# Patient Record
Sex: Female | Born: 1965 | Race: White | Hispanic: No | Marital: Married | Smoking: Current every day smoker
Health system: Southern US, Community
[De-identification: ages and names within clinical notes are randomized; demographics above are authoritative.]

## PROBLEM LIST (undated history)

## (undated) DIAGNOSIS — D3501 Benign neoplasm of right adrenal gland: Secondary | ICD-10-CM

## (undated) DIAGNOSIS — G4733 Obstructive sleep apnea (adult) (pediatric): Secondary | ICD-10-CM

## (undated) DIAGNOSIS — I200 Unstable angina: Secondary | ICD-10-CM

## (undated) DIAGNOSIS — K2270 Barrett's esophagus without dysplasia: Secondary | ICD-10-CM

## (undated) DIAGNOSIS — I639 Cerebral infarction, unspecified: Secondary | ICD-10-CM

## (undated) DIAGNOSIS — Z86010 Personal history of colonic polyps: Secondary | ICD-10-CM

## (undated) DIAGNOSIS — I2510 Atherosclerotic heart disease of native coronary artery without angina pectoris: Secondary | ICD-10-CM

## (undated) DIAGNOSIS — K7581 Nonalcoholic steatohepatitis (NASH): Secondary | ICD-10-CM

## (undated) DIAGNOSIS — I219 Acute myocardial infarction, unspecified: Secondary | ICD-10-CM

## (undated) DIAGNOSIS — F419 Anxiety disorder, unspecified: Secondary | ICD-10-CM

## (undated) DIAGNOSIS — F32A Depression, unspecified: Secondary | ICD-10-CM

## (undated) DIAGNOSIS — D649 Anemia, unspecified: Secondary | ICD-10-CM

## (undated) DIAGNOSIS — D1803 Hemangioma of intra-abdominal structures: Secondary | ICD-10-CM

## (undated) DIAGNOSIS — T7840XA Allergy, unspecified, initial encounter: Secondary | ICD-10-CM

## (undated) DIAGNOSIS — M1990 Unspecified osteoarthritis, unspecified site: Secondary | ICD-10-CM

## (undated) DIAGNOSIS — K219 Gastro-esophageal reflux disease without esophagitis: Secondary | ICD-10-CM

## (undated) DIAGNOSIS — R0989 Other specified symptoms and signs involving the circulatory and respiratory systems: Secondary | ICD-10-CM

## (undated) DIAGNOSIS — I10 Essential (primary) hypertension: Secondary | ICD-10-CM

## (undated) DIAGNOSIS — K7689 Other specified diseases of liver: Secondary | ICD-10-CM

## (undated) DIAGNOSIS — F329 Major depressive disorder, single episode, unspecified: Secondary | ICD-10-CM

## (undated) DIAGNOSIS — E785 Hyperlipidemia, unspecified: Secondary | ICD-10-CM

## (undated) DIAGNOSIS — G43909 Migraine, unspecified, not intractable, without status migrainosus: Secondary | ICD-10-CM

## (undated) DIAGNOSIS — R002 Palpitations: Secondary | ICD-10-CM

## (undated) DIAGNOSIS — R011 Cardiac murmur, unspecified: Secondary | ICD-10-CM

## (undated) HISTORY — DX: Depression, unspecified: F32.A

## (undated) HISTORY — DX: Major depressive disorder, single episode, unspecified: F32.9

## (undated) HISTORY — PX: TUBAL LIGATION: SHX77

## (undated) HISTORY — DX: Obstructive sleep apnea (adult) (pediatric): G47.33

## (undated) HISTORY — DX: Anemia, unspecified: D64.9

## (undated) HISTORY — DX: Allergy, unspecified, initial encounter: T78.40XA

## (undated) HISTORY — DX: Unstable angina: I20.0

## (undated) HISTORY — DX: Migraine, unspecified, not intractable, without status migrainosus: G43.909

## (undated) HISTORY — DX: Atherosclerotic heart disease of native coronary artery without angina pectoris: I25.10

## (undated) HISTORY — PX: CERVICAL FUSION: SHX112

## (undated) HISTORY — DX: Hyperlipidemia, unspecified: E78.5

## (undated) HISTORY — DX: Unspecified osteoarthritis, unspecified site: M19.90

## (undated) HISTORY — PX: TONSILLECTOMY: SUR1361

## (undated) HISTORY — DX: Cerebral infarction, unspecified: I63.9

## (undated) HISTORY — DX: Palpitations: R00.2

## (undated) HISTORY — PX: ABDOMINOPLASTY: SUR9

## (undated) HISTORY — DX: Barrett's esophagus without dysplasia: K22.70

## (undated) HISTORY — DX: Other specified symptoms and signs involving the circulatory and respiratory systems: R09.89

## (undated) HISTORY — PX: BLADDER SUSPENSION: SHX72

## (undated) HISTORY — PX: ESOPHAGOGASTRODUODENOSCOPY: SHX1529

## (undated) HISTORY — DX: Hemangioma of intra-abdominal structures: D18.03

## (undated) HISTORY — DX: Acute myocardial infarction, unspecified: I21.9

## (undated) HISTORY — DX: Anxiety disorder, unspecified: F41.9

## (undated) HISTORY — DX: Other specified diseases of liver: K76.89

## (undated) HISTORY — DX: Benign neoplasm of right adrenal gland: D35.01

## (undated) HISTORY — DX: Essential (primary) hypertension: I10

## (undated) HISTORY — DX: Personal history of colonic polyps: Z86.010

## (undated) HISTORY — PX: COLONOSCOPY W/ BIOPSIES: SHX1374

## (undated) HISTORY — DX: Gastro-esophageal reflux disease without esophagitis: K21.9

## (undated) HISTORY — DX: Cardiac murmur, unspecified: R01.1

---

## 1998-08-12 ENCOUNTER — Ambulatory Visit (HOSPITAL_COMMUNITY): Admission: RE | Admit: 1998-08-12 | Discharge: 1998-08-12 | Payer: Self-pay | Admitting: Orthopedic Surgery

## 1998-11-21 ENCOUNTER — Other Ambulatory Visit: Admission: RE | Admit: 1998-11-21 | Discharge: 1998-11-21 | Payer: Self-pay | Admitting: Obstetrics and Gynecology

## 1998-12-15 ENCOUNTER — Ambulatory Visit (HOSPITAL_COMMUNITY): Admission: RE | Admit: 1998-12-15 | Discharge: 1998-12-15 | Payer: Self-pay | Admitting: *Deleted

## 1998-12-15 ENCOUNTER — Encounter (INDEPENDENT_AMBULATORY_CARE_PROVIDER_SITE_OTHER): Payer: Self-pay | Admitting: Specialist

## 1999-12-12 ENCOUNTER — Other Ambulatory Visit: Admission: RE | Admit: 1999-12-12 | Discharge: 1999-12-12 | Payer: Self-pay | Admitting: Obstetrics and Gynecology

## 2001-02-18 ENCOUNTER — Encounter: Admission: RE | Admit: 2001-02-18 | Discharge: 2001-02-18 | Payer: Self-pay | Admitting: Family Medicine

## 2001-02-18 ENCOUNTER — Encounter: Payer: Self-pay | Admitting: Family Medicine

## 2001-03-31 ENCOUNTER — Encounter: Payer: Self-pay | Admitting: Neurosurgery

## 2001-04-02 ENCOUNTER — Ambulatory Visit (HOSPITAL_COMMUNITY): Admission: RE | Admit: 2001-04-02 | Discharge: 2001-04-03 | Payer: Self-pay | Admitting: Neurosurgery

## 2001-04-02 ENCOUNTER — Encounter: Payer: Self-pay | Admitting: Neurosurgery

## 2001-05-22 ENCOUNTER — Ambulatory Visit (HOSPITAL_COMMUNITY): Admission: RE | Admit: 2001-05-22 | Discharge: 2001-05-22 | Payer: Self-pay | Admitting: Neurosurgery

## 2001-05-22 ENCOUNTER — Encounter: Payer: Self-pay | Admitting: Neurosurgery

## 2002-04-08 ENCOUNTER — Encounter: Payer: Self-pay | Admitting: Occupational Medicine

## 2002-04-08 ENCOUNTER — Encounter: Admission: RE | Admit: 2002-04-08 | Discharge: 2002-04-08 | Payer: Self-pay | Admitting: Occupational Medicine

## 2002-05-11 ENCOUNTER — Encounter: Admission: RE | Admit: 2002-05-11 | Discharge: 2002-05-27 | Payer: Self-pay | Admitting: Occupational Medicine

## 2003-05-07 ENCOUNTER — Emergency Department (HOSPITAL_COMMUNITY): Admission: EM | Admit: 2003-05-07 | Discharge: 2003-05-07 | Payer: Self-pay | Admitting: Emergency Medicine

## 2003-08-09 ENCOUNTER — Ambulatory Visit (HOSPITAL_COMMUNITY): Admission: RE | Admit: 2003-08-09 | Discharge: 2003-08-09 | Payer: Self-pay | Admitting: Family Medicine

## 2003-09-02 ENCOUNTER — Encounter: Admission: RE | Admit: 2003-09-02 | Discharge: 2003-09-02 | Payer: Self-pay | Admitting: Family Medicine

## 2003-11-19 ENCOUNTER — Inpatient Hospital Stay (HOSPITAL_COMMUNITY): Admission: EM | Admit: 2003-11-19 | Discharge: 2003-11-23 | Payer: Self-pay | Admitting: *Deleted

## 2003-11-30 ENCOUNTER — Other Ambulatory Visit: Admission: RE | Admit: 2003-11-30 | Discharge: 2003-11-30 | Payer: Self-pay | Admitting: Obstetrics and Gynecology

## 2004-01-17 ENCOUNTER — Encounter (INDEPENDENT_AMBULATORY_CARE_PROVIDER_SITE_OTHER): Payer: Self-pay | Admitting: Specialist

## 2004-01-17 ENCOUNTER — Ambulatory Visit (HOSPITAL_COMMUNITY): Admission: RE | Admit: 2004-01-17 | Discharge: 2004-01-17 | Payer: Self-pay | Admitting: Obstetrics and Gynecology

## 2004-03-09 ENCOUNTER — Ambulatory Visit: Payer: Self-pay | Admitting: Cardiology

## 2004-03-15 ENCOUNTER — Ambulatory Visit: Payer: Self-pay

## 2004-04-03 ENCOUNTER — Ambulatory Visit: Payer: Self-pay | Admitting: Internal Medicine

## 2004-04-04 ENCOUNTER — Ambulatory Visit: Payer: Self-pay | Admitting: Internal Medicine

## 2004-04-10 ENCOUNTER — Other Ambulatory Visit: Admission: RE | Admit: 2004-04-10 | Discharge: 2004-04-10 | Payer: Self-pay | Admitting: Obstetrics and Gynecology

## 2004-04-17 ENCOUNTER — Ambulatory Visit: Payer: Self-pay | Admitting: Internal Medicine

## 2004-05-17 ENCOUNTER — Ambulatory Visit: Payer: Self-pay | Admitting: Internal Medicine

## 2004-05-22 ENCOUNTER — Ambulatory Visit: Payer: Self-pay | Admitting: Cardiology

## 2004-07-03 ENCOUNTER — Ambulatory Visit: Payer: Self-pay | Admitting: Cardiology

## 2004-11-29 ENCOUNTER — Ambulatory Visit: Payer: Self-pay | Admitting: Cardiology

## 2004-12-06 ENCOUNTER — Ambulatory Visit: Payer: Self-pay | Admitting: Cardiology

## 2005-05-27 ENCOUNTER — Emergency Department (HOSPITAL_COMMUNITY): Admission: EM | Admit: 2005-05-27 | Discharge: 2005-05-27 | Payer: Self-pay | Admitting: Family Medicine

## 2005-06-06 ENCOUNTER — Ambulatory Visit: Payer: Self-pay | Admitting: Cardiology

## 2005-08-03 ENCOUNTER — Emergency Department (HOSPITAL_COMMUNITY): Admission: EM | Admit: 2005-08-03 | Discharge: 2005-08-03 | Payer: Self-pay | Admitting: Emergency Medicine

## 2005-08-29 ENCOUNTER — Encounter: Payer: Self-pay | Admitting: Orthopedic Surgery

## 2006-02-20 ENCOUNTER — Ambulatory Visit: Payer: Self-pay | Admitting: Cardiology

## 2006-02-27 ENCOUNTER — Ambulatory Visit: Payer: Self-pay

## 2006-04-30 ENCOUNTER — Encounter (INDEPENDENT_AMBULATORY_CARE_PROVIDER_SITE_OTHER): Payer: Self-pay | Admitting: *Deleted

## 2006-05-03 ENCOUNTER — Ambulatory Visit: Payer: Self-pay | Admitting: Cardiology

## 2006-05-08 ENCOUNTER — Ambulatory Visit: Payer: Self-pay | Admitting: Cardiology

## 2006-05-28 ENCOUNTER — Ambulatory Visit: Payer: Self-pay | Admitting: Internal Medicine

## 2006-05-28 LAB — CONVERTED CEMR LAB
Basophils Absolute: 0.1 10*3/uL (ref 0.0–0.1)
Basophils Relative: 0.5 % (ref 0.0–1.0)
Eosinophils Absolute: 0.1 10*3/uL (ref 0.0–0.6)
INR: 0.9 (ref 0.9–2.0)
MCHC: 35 g/dL (ref 30.0–36.0)
MCV: 83.6 fL (ref 78.0–100.0)
Neutro Abs: 5.3 10*3/uL (ref 1.4–7.7)
Platelets: 279 10*3/uL (ref 150–400)
Prothrombin Time: 11.5 s (ref 10.0–14.0)
RBC: 5 M/uL (ref 3.87–5.11)
RDW: 12.7 % (ref 11.5–14.6)

## 2006-05-31 ENCOUNTER — Ambulatory Visit: Payer: Self-pay | Admitting: Cardiology

## 2006-05-31 LAB — CONVERTED CEMR LAB
AST: 35 units/L (ref 0–37)
Albumin: 3.9 g/dL (ref 3.5–5.2)
CO2: 30 meq/L (ref 19–32)
Chloride: 105 meq/L (ref 96–112)
Creatinine, Ser: 0.7 mg/dL (ref 0.4–1.2)
GFR calc non Af Amer: 98 mL/min
Glucose, Bld: 88 mg/dL (ref 70–99)
Sodium: 140 meq/L (ref 135–145)
Total Bilirubin: 0.5 mg/dL (ref 0.3–1.2)
Total Protein: 6.8 g/dL (ref 6.0–8.3)
Triglycerides: 106 mg/dL (ref 0–149)

## 2006-06-06 ENCOUNTER — Encounter (INDEPENDENT_AMBULATORY_CARE_PROVIDER_SITE_OTHER): Payer: Self-pay | Admitting: *Deleted

## 2006-06-06 ENCOUNTER — Ambulatory Visit (HOSPITAL_COMMUNITY): Admission: RE | Admit: 2006-06-06 | Discharge: 2006-06-06 | Payer: Self-pay | Admitting: Internal Medicine

## 2008-07-26 ENCOUNTER — Encounter: Payer: Self-pay | Admitting: Cardiology

## 2008-07-26 ENCOUNTER — Ambulatory Visit: Payer: Self-pay | Admitting: Cardiology

## 2008-07-26 DIAGNOSIS — I10 Essential (primary) hypertension: Secondary | ICD-10-CM | POA: Insufficient documentation

## 2008-07-26 DIAGNOSIS — I2510 Atherosclerotic heart disease of native coronary artery without angina pectoris: Secondary | ICD-10-CM

## 2008-07-26 DIAGNOSIS — F17200 Nicotine dependence, unspecified, uncomplicated: Secondary | ICD-10-CM | POA: Insufficient documentation

## 2008-07-26 DIAGNOSIS — G44009 Cluster headache syndrome, unspecified, not intractable: Secondary | ICD-10-CM | POA: Insufficient documentation

## 2008-07-26 DIAGNOSIS — E7849 Other hyperlipidemia: Secondary | ICD-10-CM | POA: Insufficient documentation

## 2008-07-27 ENCOUNTER — Telehealth (INDEPENDENT_AMBULATORY_CARE_PROVIDER_SITE_OTHER): Payer: Self-pay

## 2008-07-28 ENCOUNTER — Ambulatory Visit: Payer: Self-pay

## 2008-07-28 ENCOUNTER — Encounter: Payer: Self-pay | Admitting: Cardiology

## 2008-07-28 ENCOUNTER — Ambulatory Visit: Payer: Self-pay | Admitting: Cardiology

## 2008-07-28 LAB — CONVERTED CEMR LAB
Bilirubin, Direct: 0.1 mg/dL (ref 0.0–0.3)
Cholesterol: 250 mg/dL — ABNORMAL HIGH (ref 0–200)
Direct LDL: 192.2 mg/dL
Total Bilirubin: 0.6 mg/dL (ref 0.3–1.2)
Total CHOL/HDL Ratio: 6
Triglycerides: 131 mg/dL (ref 0.0–149.0)
VLDL: 26.2 mg/dL (ref 0.0–40.0)

## 2008-09-10 ENCOUNTER — Encounter: Admission: RE | Admit: 2008-09-10 | Discharge: 2008-09-10 | Payer: Self-pay | Admitting: Obstetrics and Gynecology

## 2009-05-30 ENCOUNTER — Ambulatory Visit: Payer: Self-pay | Admitting: Cardiology

## 2009-05-30 DIAGNOSIS — R5381 Other malaise: Secondary | ICD-10-CM

## 2009-05-30 DIAGNOSIS — R5383 Other fatigue: Secondary | ICD-10-CM

## 2009-05-30 DIAGNOSIS — R079 Chest pain, unspecified: Secondary | ICD-10-CM | POA: Insufficient documentation

## 2009-06-01 LAB — CONVERTED CEMR LAB
ALT: 29 units/L (ref 0–35)
AST: 22 units/L (ref 0–37)
Albumin: 4.3 g/dL (ref 3.5–5.2)
BUN: 11 mg/dL (ref 6–23)
Basophils Relative: 0.3 % (ref 0.0–3.0)
Bilirubin, Direct: 0 mg/dL (ref 0.0–0.3)
CO2: 30 meq/L (ref 19–32)
Chloride: 102 meq/L (ref 96–112)
Eosinophils Absolute: 0.1 10*3/uL (ref 0.0–0.7)
Eosinophils Relative: 0.9 % (ref 0.0–5.0)
Lymphocytes Relative: 41 % (ref 12.0–46.0)
Lymphs Abs: 4.3 10*3/uL — ABNORMAL HIGH (ref 0.7–4.0)
Neutrophils Relative %: 51.3 % (ref 43.0–77.0)
Platelets: 294 10*3/uL (ref 150.0–400.0)
RDW: 13.3 % (ref 11.5–14.6)
Sodium: 139 meq/L (ref 135–145)
Total Bilirubin: 0.2 mg/dL — ABNORMAL LOW (ref 0.3–1.2)
Total Protein: 7.3 g/dL (ref 6.0–8.3)

## 2009-06-07 ENCOUNTER — Telehealth (INDEPENDENT_AMBULATORY_CARE_PROVIDER_SITE_OTHER): Payer: Self-pay | Admitting: *Deleted

## 2009-06-08 ENCOUNTER — Ambulatory Visit (HOSPITAL_COMMUNITY): Admission: RE | Admit: 2009-06-08 | Discharge: 2009-06-08 | Payer: Self-pay | Admitting: Cardiology

## 2009-06-08 ENCOUNTER — Encounter: Payer: Self-pay | Admitting: Cardiology

## 2009-06-08 ENCOUNTER — Ambulatory Visit: Payer: Self-pay | Admitting: Cardiovascular Disease

## 2009-06-08 ENCOUNTER — Ambulatory Visit: Payer: Self-pay

## 2009-06-13 ENCOUNTER — Telehealth: Payer: Self-pay | Admitting: Cardiology

## 2009-06-22 ENCOUNTER — Encounter: Payer: Self-pay | Admitting: Cardiology

## 2009-06-27 ENCOUNTER — Ambulatory Visit: Payer: Self-pay | Admitting: Cardiology

## 2009-07-11 ENCOUNTER — Ambulatory Visit: Payer: Self-pay | Admitting: Cardiology

## 2009-07-20 LAB — CONVERTED CEMR LAB
Alkaline Phosphatase: 45 units/L (ref 39–117)
Bilirubin, Direct: 0 mg/dL (ref 0.0–0.3)
Cholesterol: 166 mg/dL (ref 0–200)
LDL Cholesterol: 88 mg/dL (ref 0–99)
Total Bilirubin: 0.2 mg/dL — ABNORMAL LOW (ref 0.3–1.2)
Total CHOL/HDL Ratio: 3
Triglycerides: 63 mg/dL (ref 0.0–149.0)
VLDL: 12.6 mg/dL (ref 0.0–40.0)

## 2009-10-18 ENCOUNTER — Encounter (INDEPENDENT_AMBULATORY_CARE_PROVIDER_SITE_OTHER): Payer: Self-pay | Admitting: *Deleted

## 2010-01-04 ENCOUNTER — Encounter: Payer: Self-pay | Admitting: Cardiology

## 2010-01-13 ENCOUNTER — Encounter: Payer: Self-pay | Admitting: Cardiology

## 2010-01-13 ENCOUNTER — Ambulatory Visit: Payer: Self-pay | Admitting: Cardiology

## 2010-01-16 ENCOUNTER — Emergency Department (HOSPITAL_COMMUNITY): Admission: EM | Admit: 2010-01-16 | Discharge: 2010-01-16 | Payer: Self-pay | Admitting: Physician Assistant

## 2010-01-18 ENCOUNTER — Telehealth (INDEPENDENT_AMBULATORY_CARE_PROVIDER_SITE_OTHER): Payer: Self-pay | Admitting: *Deleted

## 2010-01-20 ENCOUNTER — Encounter: Payer: Self-pay | Admitting: Cardiology

## 2010-02-10 ENCOUNTER — Ambulatory Visit: Payer: Self-pay | Admitting: Cardiology

## 2010-03-08 ENCOUNTER — Telehealth: Payer: Self-pay | Admitting: Cardiology

## 2010-04-14 ENCOUNTER — Ambulatory Visit: Payer: Self-pay | Admitting: Cardiology

## 2010-05-19 ENCOUNTER — Other Ambulatory Visit: Payer: Self-pay | Admitting: Cardiology

## 2010-05-19 LAB — LDL CHOLESTEROL, DIRECT: Direct LDL: 216 mg/dL

## 2010-05-19 LAB — HEPATIC FUNCTION PANEL
ALT: 50 U/L — ABNORMAL HIGH (ref 0–35)
AST: 41 U/L — ABNORMAL HIGH (ref 0–37)
Albumin: 4.3 g/dL (ref 3.5–5.2)
Alkaline Phosphatase: 57 U/L (ref 39–117)
Bilirubin, Direct: 0.1 mg/dL (ref 0.0–0.3)
Total Bilirubin: 0.5 mg/dL (ref 0.3–1.2)
Total Protein: 7.1 g/dL (ref 6.0–8.3)

## 2010-05-19 LAB — LIPID PANEL
Cholesterol: 274 mg/dL — ABNORMAL HIGH (ref 0–200)
HDL: 56.7 mg/dL (ref >39.00)
Total CHOL/HDL Ratio: 5
Triglycerides: 127 mg/dL (ref 0.0–149.0)
VLDL: 25.4 mg/dL (ref 0.0–40.0)

## 2010-06-01 NOTE — Letter (Signed)
Summary: [facility] - Walk In Patient Form  [facility] - Walk In Patient Form   Imported By: Marilynne Drivers 02/07/2010 08:11:07  _____________________________________________________________________  External Attachment:    Type:   Image     Comment:   External Document

## 2010-06-01 NOTE — Letter (Signed)
Summary: [facility]   Imported By: Sherri Sear 12/30/2009 12:31:20  _____________________________________________________________________  External Attachment:    Type:   Image     Comment:   External Document

## 2010-06-01 NOTE — Assessment & Plan Note (Signed)
Summary: Cardiology Nuclear Study  Nuclear Med Background Indications for Stress Test: Evaluation for Ischemia   History: Abnormal EKG, Asthma, Heart Catheterization, Myocardial Infarction, Myocardial Perfusion Study  History Comments: '05 MI>Cath:Moderate CAD and possible vasospasm; 3/10  MPI: normal, EF=74%  Symptoms: Chest Pain, Chest Tightness, Dizziness, DOE, Fatigue, Near Syncope, Palpitations, Rapid HR, SOB  Symptoms Comments: Last episode of JT:TSVX week.   Nuclear Pre-Procedure Cardiac Risk Factors: Family History - CAD, Hypertension, Lipids, Smoker Caffeine/Decaff Intake: none NPO After: 8:00 PM Lungs: Clear.  O2 Sat 98% on RA. IV 0.9% NS with Angio Cath: 18g     IV Site: (R) AC IV Started by: Eliezer Lofts EMT-P Chest Size (in) 38     Cup Size C     Height (in): 68 Weight (lb): 161 BMI: 24.57  Nuclear Med Study 1 or 2 day study:  1 day     Stress Test Type:  Carlton Adam Reading MD:  Jenkins Rouge, MD     Referring MD:  Kirk Ruths, MD Resting Radionuclide:  Technetium 45mTetrofosmin     Resting Radionuclide Dose:  10.4 mCi  Stress Radionuclide:  Technetium 93metrofosmin     Stress Radionuclide Dose:  33.0 mCi   Stress Protocol   Lexiscan: 0.4 mg   Stress Test Technologist:  ShValetta FullerMA-N     Nuclear Technologist:  WaMariann Lastereal RT-N  Rest Procedure  Myocardial perfusion imaging was performed at rest 45 minutes following the intravenous administration of Myoview Technetium 9967mtrofosmin.  Stress Procedure  The patient received IV Lexiscan 0.4 mg over 15-seconds.  Myoview injected at 30-seconds.  There were no diagnostic changes with lexiscan; there were diffuse T-wave changes.  Quantitative spect images were obtained after a 45 minute delay.  QPS Raw Data Images:  Normal; no motion artifact; normal heart/lung ratio. Stress Images:  NI: Uniform and normal uptake of tracer in all myocardial segments. Rest Images:  Normal homogeneous uptake in all areas of  the myocardium. Subtraction (SDS):  Normal Transient Ischemic Dilatation:  1.09  (Normal <1.22)  Lung/Heart Ratio:  .29  (Normal <0.45)  Quantitative Gated Spect Images QGS EDV:  93 ml QGS ESV:  31 ml QGS EF:  67 % QGS cine images:  normal  Findings Normal nuclear study      Overall Impression  Exercise Capacity: lexiscan BP Response: Normal blood pressure response. Clinical Symptoms: Dizzy ECG Impression: No significant ST segment change suggestive of ischemia. Overall Impression: Normal stress nuclear study. Overall Impression Comments: Normal  Appended Document: Cardiology Nuclear Study normal  Appended Document: Cardiology Nuclear Study PT AWARE./CY

## 2010-06-01 NOTE — Miscellaneous (Signed)
  Clinical Lists Changes  Observations: Added new observation of NUCLEAR NOS: QPS  Raw Data Images:  Normal; no motion artifact; normal heart/lung ratio. Stress Images:  NI: Uniform and normal uptake of tracer in all myocardial segments. Rest Images:  Normal homogeneous uptake in all areas of the myocardium. Subtraction (SDS):  Normal Transient Ischemic Dilatation:  1.09  (Normal <1.22)  Lung/Heart Ratio:  .29  (Normal <0.45)  Quantitative Gated Spect Images  QGS EDV:  93 ml QGS ESV:  31 ml QGS EF:  67 % QGS cine images:  normal  Findings  Normal nuclear study      Overall Impression   Exercise Capacity: lexiscan BP Response: Normal blood pressure response. Clinical Symptoms: Dizzy ECG Impression: No significant ST segment change suggestive of ischemia. Overall Impression: Normal stress nuclear study. Overall Impression Comments: Normal    Signed by Bosie Clos, MD, [facility] on 06/08/2009 at 5:25 PM (06/08/2009 14:34)      Nuclear Study  Procedure date:  06/08/2009  Findings:      QPS  Raw Data Images:  Normal; no motion artifact; normal heart/lung ratio. Stress Images:  NI: Uniform and normal uptake of tracer in all myocardial segments. Rest Images:  Normal homogeneous uptake in all areas of the myocardium. Subtraction (SDS):  Normal Transient Ischemic Dilatation:  1.09  (Normal <1.22)  Lung/Heart Ratio:  .29  (Normal <0.45)  Quantitative Gated Spect Images  QGS EDV:  93 ml QGS ESV:  31 ml QGS EF:  67 % QGS cine images:  normal  Findings  Normal nuclear study      Overall Impression   Exercise Capacity: lexiscan BP Response: Normal blood pressure response. Clinical Symptoms: Dizzy ECG Impression: No significant ST segment change suggestive of ischemia. Overall Impression: Normal stress nuclear study. Overall Impression Comments: Normal    Signed by Bosie Clos, MD, [facility] on 06/08/2009 at 5:25 PM

## 2010-06-01 NOTE — Letter (Signed)
Summary: Appointment - Reminder [place], [place]  [address] [place]   [place], [place] [address]   Phone: [phone]  Fax: [phone]     October 18, 2009 MRN: [id number]   [facility] [place], [place]  [address]   Dear Ms. Montesinos,  Our records indicate that it is time to schedule a follow-up appointment with Dr. Aundra Dubin in August. It is very important that we reach you to schedule this appointment. We look forward to participating in your health care needs. Please contact us at the number listed above at your earliest convenience to schedule your appointment.  If you are unable to make an appointment at this time, give [place] a call so we can update our records.     Sincerely,   Darnell Level [facility] Scheduling Team

## 2010-06-01 NOTE — Procedures (Signed)
Summary: Summary Report  Summary Report   Imported By: Gemma Payor 03/03/2010 10:50:23  _____________________________________________________________________  External Attachment:    Type:   Image     Comment:   External Document

## 2010-06-01 NOTE — Progress Notes (Signed)
Summary: At [place] By: Marilynne Drivers 08/16/2009 12:38:17  _____________________________________________________________________  External Attachment:    Type:   Image     Comment:   External Document

## 2010-06-01 NOTE — Assessment & Plan Note (Signed)
Summary: per check out/sf   Visit Type:  6 mo f/u Primary Provider:  Margaretmary Eddy, MD  CC:  pt states she is feeling better than she had been....denies any cardiac complaints today...says she is stressed out due to going back to school....  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  Pt has been having episodes of for years.  She had an MI in 2005 with 60-70% distal LAD stenosis and 60-70% ostial D1 stenosis.  There was no culprit lesion for MI.  It was thought that her symptoms might be due to vasospasm.  She has had chest pain on and off for years.  ETT-myoview in 2/11 showed no ischemia or infarction.   Patient developed significant palpitations a couple of months ago.  She felt her heart skip beats.  She eventually went to the ER.  She was set up with an event monitor, which showed occasional PACs and PVCs, no significant arrhythmias.  Of note, the onset of the palpitations corresponded with increased stress (had started school for mechanical drafting).  The palpitations have subsided recently without intervention.  No recent chest pain.  No significant exertional dyspnea.  Patient is still smoking a pack every 4 days or so.  She has had myalgias with Crestor, Zocor, and Lipitor and is not on a statin currently.    Labs (3/10): LDL 192, HDL 39, LFTs normal Labs (1/11): K 3.9, creatinine 0.7, HCT 46, LFTs normal, TSH normal, free T4 normal Labs (3/11): LDL 80, HDL 66    Current Medications (verified): 1)  Benazepril Hcl 20 Mg Tabs (Benazepril Hcl) .Marland Kitchen.. 1 Tab Once Daily 2)  Aspirin 325 Mg Tabs (Aspirin) .... Take 1 Tab By Mouth Every Day 3)  Amlodipine Besylate 10 Mg Tabs (Amlodipine Besylate) .... Take One Tablet By Mouth Daily 4)  Nitrostat 0.4 Mg Subl (Nitroglycerin) .Marland Kitchen.. 1 Tablet Under Tongue At Onset of Chest Pain; You May Repeat Every 5 Minutes For Up To 3 Doses. 5)  Vitamin D3 10000 Unit Caps (Cholecalciferol) .Marland Kitchen.. 1 Tab  2-3 Times Weekly 6)  Potassium 99 Mg Tabs (Potassium) .... As Needed 7)  Alprazolam 0.25 Mg Tabs (Alprazolam) .... Take As Needed For Rest 8)  Wellbutrin Sr 150 Mg Xr12h-Tab (Bupropion Hcl) .... One Twice A Day 9)  Co Q 10 10 Mg Caps (Coenzyme Q10) .... As Needed 10)  Vitamin E 400 Unit Caps (Vitamin E) .Marland Kitchen.. 1 Cap Once Daily 11)  Ra Vitamin A 10000 Unit Caps (Vitamin A) .Marland Kitchen.. 1 Cap As Needed  Allergies: 1)  ! Codeine 2)  ! * Isosorbide 3)  ! * Crestor  Past History:  Past Medical History: 1.  CAD:  MI in 2005.  LHC with 50-60% distal LAD stenosis, 60-70% ostial D1 stenosis.  There was also concern for coronary vasospasm.  ETT-myoview (3/10): EF 69%, normal perfusion.   Lexiscan myoview (2/11): EF 67%, no ischemia or infarction.  Echo (2/11): Mild LVH, EF 50-55%, apical septal hypokinesis.   2.  Asthma 3.  HTN 4.  Hyperlipidemia 5.  Elevated LFTs:  Workup included liver biopsy that was unrevealing.    6.  Smoker.  7.  Migraine headaches.  8.  Abdominal US (2/11): no AAA 9.  Palpitations: Event monitor (9/11) showed occasional PACs and PVCs.   Family History: Reviewed history from 05/30/2009 and no changes required. Mother is still living.  She is status post PCI in early 36s and status post  repair of an abdominal aortic aneurysm in her early 51s.  Father is living.  He is status post coronary artery bypass graft surgery in his 41s and status post repair of abdominal aortic aneurysm in his 68s.  She has one brother and one sister-both living.  In them, there is no coronary disease, diabetes, or hypertension.  Social History: Reviewed history from 05/30/2009 and no changes required. Married, lives in [place] Use - Yes. Pt smokes 1/4 ppd.  Alcohol Use - yes--occasional Drug Use - no Works as a Furniture conservator/restorer.   Review of [place] reviewed and negative except as per HPI.   Vital Signs:  Patient profile:   45 year old female Height:      68  inches Weight:      163.8 pounds BMI:     25.00 Pulse rate:   80 / minute Pulse rhythm:   regular BP sitting:   132 / 88  (left arm) Cuff size:   large  Vitals Entered By: Julaine Hua, CMA (February 10, 2010 10:38 AM)  Physical Exam  General:  Well developed, well nourished, in no acute distress. Neck:  Neck supple, no JVD. No masses, thyromegaly or abnormal cervical nodes. Lungs:  Clear bilaterally to auscultation and percussion. Heart:  Non-displaced PMI, chest non-tender; regular rate and rhythm, S1, S2 without murmurs, rubs or gallops. Carotid upstroke normal, no bruit. Pedals normal pulses. No edema, no varicosities. Abdomen:  Bowel sounds positive; abdomen soft and non-tender without masses, organomegaly, or hernias noted. No hepatosplenomegaly. Extremities:  No clubbing or cyanosis. Neurologic:  Alert and oriented x 3. Psych:  Normal affect.   Impression & Recommendations:  Problem # 1:  CAD (ICD-414.00) Moderate coronary disease with possible vasospasm.  Stable with nonischemic myoview in 2/11.  Continue ASA, ACEI.  She has not tolerated several statins due to myalgias, but LDL is quite high off statins.  I will have her try pravastatin at 20 mg daily, taking Coenzyme Q10 also.   Lipids/LFTs in 2 months.  She will call if symptoms develop.    Problem # 2:  TOBACCO ABUSE (ICD-305.1) Strongly encouraged to quit.  She has an electronic cigarette.   Problem # 3:  VASOSPASM Possible coronary vasospasm.  Continue current dose of amlodipine.    Patient Instructions: 1)  Your physician recommends that you schedule a follow-up appointment in: 6 months with Dr. Aundra Dubin 2)  Your physician recommends that you return for a FASTING lipid profile and liver 414.01 IN 2 MONTHS 3)  Your physician has recommended you make the following change in your medication:  Prescriptions: PRAVASTATIN SODIUM 20 MG TABS (PRAVASTATIN SODIUM) take 1 tablet at bedtime.  #30 x 11   Entered by:   Joelyn Oms  RN   Authorized by:   Loralie Champagne, MD   Signed by:   Joelyn Oms RN on 02/10/2010   Method used:   [facility]        [id number]       [address]       [place], [place]  [address]       Ph: [phone]       Fax: [phone]   RxID:   [id number]

## 2010-06-01 NOTE — Progress Notes (Signed)
Summary: B/P readings  Phone Note Outgoing Call   Call placed by: Desiree Lucy, RN, BSN,  June 13, 2009 3:34 PM Call placed to: Patient Summary of Call: B/P readings  Follow-up for Phone Call        05-31-09 113/76 73   111/71 91 06-01-09 114/72 85   129/87 85 06-02-09 112/76 80 06-03-09 113/71 74 06-04-09 124/81 78 06-05-09 103/68 74  120/79  73  147/67  73  117/79  84 06-06-09 112/74 76  132/88 74  142/96  72  129/87  84    140/94 74  120/78  75 06-07-09  112/74 79  102/67  85 tired  111/74  83  129/86  75 06-08-09 115/79 83 06-09-09 122/88 86   140/92  84   134/90    74  130/86  86  141/94  79 06-10-09 130/77 75 130/88  77 06-11-09 131/85  73   121/81  70 06-12-09 113/76  72 06-13-09 119/79  87  145/77  98 felt lightheaded and SOB --just standing at machine working ---repeat about 15 minutes later-- 128/87  84  will forward to Dr Aundra Dubin for review         Appended Document: B/P readings BPs look ok.   Appended Document: B/P readings LMVM  Appended Document: B/P readings discussed with pt by telephone

## 2010-06-01 NOTE — Assessment & Plan Note (Signed)
Summary: EKG/nm  Nurse Visit   Vital Signs:  Patient profile:   45 year old female Height:      68 inches Weight:      165.75 pounds BMI:     25.29 Pulse (ortho):   72 / minute BP sitting:   139 / 109  (left arm)  Impression & Recommendations:  Problem # 1:  FATIGUE / MALAISE (ICD-780.79) Patient walked in  the office with C/O of heart skipping beats and fatigue. Pt. states these symptoms have been going on for a month, and are getting worse. An EKG was done.  B/P 139/109 pulse 72 beats /min. Pt. states she did not take her blood pressure medications yesterday, but she took them this  morning.  EKG read by Dr. Ron Parker DOD. MD states that pt. is  having occasional premature beats and that is what she is feeling, nothing to worry about. DOD recommends for pt. to take her B/P medications as order, and to make an appointment to see Dr. Marilynne Drivers. Pt. aware. She verbalized understanding.   Allergies: 1)  ! Codeine

## 2010-06-01 NOTE — Progress Notes (Signed)
Summary: pt was wondering about medication  Phone Note Refill Request Call back at Home Phone [phone] Message from:  [facility] [address]  pt was wondering what happen to her cholesterol medicine because it was never called in and she was just wondering did she miss something or did provider change his mind  Initial call taken by: Shelda Pal,  March 08, 2010 4:25 PM  Follow-up for Phone Call       Follow-up by: Doug Sou CMA,  March 08, 2010 4:44 PM    Prescriptions: PRAVASTATIN SODIUM 20 MG TABS (PRAVASTATIN SODIUM) take 1 tablet at bedtime.  #30 x 11   Entered by:   Doug Sou CMA   Authorized by:   Loralie Champagne, MD   Signed by:   Doug Sou CMA on 03/08/2010   Method used:   [facility]        [phone]       [address]       [place], [place]  [address]       Ph: [phone]       Fax: [phone]   RxID:   [id number]  2nd time sent.  There was no misunderstanding.  Pt still supposed to start Pravastatin.  Called and informed pt and then called pharmacy again to confirm they recvd it.  Pharmacist informed it was in queue.    Doug Sou, CMA

## 2010-06-01 NOTE — Procedures (Signed)
Summary: Summary Report  Summary Report   Imported By: Gemma Payor 03/03/2010 10:52:02  _____________________________________________________________________  External Attachment:    Type:   Image     Comment:   External Document

## 2010-06-01 NOTE — Progress Notes (Signed)
Summary: event monitor  Phone Note Outgoing Call Call back at [facility] Phone [phone]   Action Taken: Phone Call Completed Summary of Call: Call Pt for event monitor she has request that the monitor be mail to her home,this will be mail out by [employer]. Initial call taken by: Susette Racer,  January 18, 2010 10:27 AM

## 2010-06-01 NOTE — Assessment & Plan Note (Signed)
Summary: 1 month rov/sl   Primary Provider:  Margaretmary Eddy, MD  CC:  1 month rov.  Pt states she is tired and has had a few dizzy spells.   She has stopped taking the Isosorbide due to headache.  .  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  Pt has been having episodes of for years.  She had an MI in 2005 with 60-70% distal LAD stenosis and 60-70% ostial D1 stenosis.  There was no culprit lesion for MI.  It was thought that her symptoms might be due to vasospasm.    She has had on and off chest pain for years.  When I saw her last, she was having increased episodes of atypical chest pain for about 2 wks.  She also complained of profound fatigue.  I had her do a Lexiscan myoview showing EF 67% and normal perfusion images, suggesting no ischemia or infarction.  Echocardiogram showed EF 50-55% with mild LV hypertrophy.  Patient has been very concerned about the possibility of AAA.  Her mother had AAA repair in her early 75s.  Abdominal US showed no AAA.    I had the patient start on Imdur at last appointment for vasospasm but she was unable to tolerate it due to headaches.  The chest pain has decreased in frequency.  She still gets occasional episodes of mild nonexertional chest pain but less than prior to last appointment.  Patient continues to be fatigued.  TSH and CBC were normal.  She does report a lot of stress at work and poor sleep, which may be contributing.  She does not have daytimes sleepiness and does not snore loudly or gasp for breath at night per her husband.    Labs (3/10): LDL 192, HDL 39, LFTs normal Labs (1/11): K 3.9, creatinine 0.7, HCT 46, LFTs normal, TSH normal, free T4 normal    Current Medications (verified): 1)  Benazepril Hcl 20 Mg Tabs (Benazepril Hcl) .Marland Kitchen.. 1 Tab Once Daily 2)  Aspirin 325 Mg Tabs (Aspirin) .... Take 1 Tab By Mouth Every Day 3)  Amlodipine Besylate 10 Mg Tabs (Amlodipine Besylate) ....  Take One Tablet By Mouth Daily 4)  Nitrostat 0.4 Mg Subl (Nitroglycerin) .... As Needed 5)  Vit D .... Prn 6)  Otc Potassium .... As Needed 7)  Alprazolam 0.25 Mg Tabs (Alprazolam) .... Take As Needed For Rest 8)  Isosorbide Mononitrate Cr 30 Mg Xr24h-Tab (Isosorbide Mononitrate) .... Take One Tablet By Mouth Daily-Hold Per Pt. 9)  Wellbutrin Sr 150 Mg Xr12h-Tab (Bupropion Hcl) .... One Twice A Day 10)  Crestor 5 Mg Tabs (Rosuvastatin Calcium) .... One Tablet Daily  Allergies (verified): 1)  ! Codeine  Past History:  Past Medical History: 1.  CAD:  MI in 2005.  LHC with 50-60% distal LAD stenosis, 60-70% ostial D1 stenosis.  There was also concern for coronary vasospasm.  ETT-myoview (3/10): EF 69%, normal perfusion.   Lexiscan myoview (2/11): EF 67%, no ischemia or infarction.  Echo (2/11): Mild LVH, EF 50-55%, apical septal hypokinesis.   2.  Asthma 3.  HTN 4.  Hyperlipidemia 5.  Elevated LFTs:  Workup included liver biopsy that was unrevealing.    6.  Smoker.  7.  Migraine headaches.  8.  Abdominal US (2/11): no AAA  Family History: Reviewed history from 05/30/2009 and no changes required. Mother is still living.  She is status post PCI in early 71s and status post  repair of an abdominal aortic aneurysm in her early 42s.  Father is living.  He is status post coronary artery bypass graft surgery in his 73s and status post repair of abdominal aortic aneurysm in his 49s.  She has one brother and one sister-both living.  In them, there is no coronary disease, diabetes, or hypertension.  Social History: Reviewed history from 05/30/2009 and no changes required. Married, lives in [place] Use - Yes. Pt smokes 1/2 ppd.  Alcohol Use - yes--occasional Drug Use - no Works as a Furniture conservator/restorer.   Review of [place] reviewed and negative except as per HPI.   Vital Signs:  Patient profile:   45 year old female Height:      68 inches Weight:      160  pounds BMI:     24.42 Pulse rate:   74 / minute Pulse rhythm:   regular BP sitting:   134 / 94  (right arm) Cuff size:   regular  Vitals Entered By: Doug Sou CMA (June 27, 2009 10:02 AM)  Physical Exam  General:  Well developed, well nourished, in no acute distress. Neck:  Neck supple, no JVD. No masses, thyromegaly or abnormal cervical nodes. Lungs:  Clear bilaterally to auscultation and percussion. Heart:  Non-displaced PMI, chest non-tender; regular rate and rhythm, S1, S2 without murmurs, rubs or gallops. Carotid upstroke normal, no bruit. Pedals normal pulses. No edema, no varicosities. Abdomen:  Bowel sounds positive; abdomen soft and non-tender. No hepatosplenomegaly.  Abdominal aorta pulsation palpable, difficult to tell but may be borderline enlarged by palpation.  Extremities:  No clubbing or cyanosis. Neurologic:  Alert and oriented x 3. Psych:  Normal affect.   Impression & Recommendations:  Problem # 1:  CHEST PAIN UNSPECIFIED (ICD-786.50) Patient has atypical chest pain and has been thought to have vasospasm.  She additionally has a history of moderate CAD.  Lexiscan myoview showed no ischemia or infarction.  Chest pain has been decreasing in frequency back to her baseline long-term pattern.  I would continue treatment for vasospasm with amlodipine.  She could not tolerate Imdur. I have asked her to avoid using Zomig as this could possibly potentiate vasospasm.  She will contact her neurologist for non-triptan agents to help the migraines.  Problem # 2:  CAD (ICD-414.00) Stable with nonischemic myoview.  She has tolerated starting a low dose statin.  Continue ASA, ACEI, statin.   Problem # 3:  HYPERLIPIDEMIA-MIXED (WLN-989.4) LFTs normal on low dose statin.   Repeat lipoids/LFTs 1 month.   Problem # 4:  FATIGUE / MALAISE (ICD-780.79) I do not have a good explanation for this.  Heart testing looked ok.  TSH and CBC were normal.  She is under a lot of stress at  work and is not sleeping well at home.  This may explain the symptoms.   Problem # 5:  TOBACCO ABUSE (ICD-305.1) I again counselled her to quit smoking.    Problem # 6:  AAA She was worried about the possibliblity of AAA given her family history.  Abdominal US looked ok.   Patient Instructions: 1)  Your physician has recommended you make the following change in your medication:  2)  Stop Zomig 3)  Your physician recommends that you return for a FASTING lipid profile/liver profile/magnesium  level   March 14,2011 4)  Your physician wants you to follow-up in: 6 months with Dr Aundra Dubin.   You will receive a  reminder letter in the mail two months in advance. If you don't receive a letter, please call our office to schedule the follow-up appointment.

## 2010-06-01 NOTE — Assessment & Plan Note (Signed)
Summary: rov   Visit Type:  Follow-up Primary Provider:  Margaretmary Eddy, MD  CC:  chest  pain -sob-fatigue.  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  Pt has been having episodes of for years.  She had an MI in 2005 with 60-70% distal LAD stenosis and 60-70% ostial D1 stenosis.  There was no culprit lesion for MI.  It was thought that her symptoms might be due to vasospasm.  She has continued to have chest tightness on and off since that time.  She had a stress myoview in 3/10 with no evidence of ischemia or infarction.  She did well for a few months but lately has been feeling profoundly fatigued.  She has been cold and feels like she has no energy.  She has been having more chest tightness for the last 2 weeks.  It does not seem to be related to exertion.  Taking a sublingual NTG sometimes helps and sometimes does not.  She has stable mild dyspnea on exertion with steps but no shortness of breath walking on flat ground.  She still smokes 1/2 ppd.  She was unable to take Chantix due to GI discomfort and failed the nicotine patch in the past.  BP is elevated today but she has not taken her BP meds.  On meds, her BP has been running in the normal range.    Patient is very concerned about the possibility of AAA.  Her mother had AAA repair in her early 42s.   ECG: NSR, LAFB, anterolateral and inferior T wave inversions  Labs (3/10): LDL 192, HDL 39, LFTs normal    Current Medications (verified): 1)  Benazepril Hcl 20 Mg Tabs (Benazepril Hcl) .Marland Kitchen.. 1 Tab Once Daily 2)  Aspirin 325 Mg Tabs (Aspirin) .... Take 1 Tab By Mouth Every Day 3)  Amlodipine Besylate 10 Mg Tabs (Amlodipine Besylate) .... Take One Tablet By Mouth Daily 4)  Zomig 2.5 Mg Tabs (Zolmitriptan) .... As Needed 5)  Nitrostat 0.4 Mg Subl (Nitroglycerin) .... As Needed 6)  Vit D .... Prn 7)  Otc Potassium .... As Needed 8)  Alprazolam 2 Mg Tabs (Alprazolam) .... Take As  Need To Rest 9)  Isosorbide Mononitrate Cr 30 Mg Xr24h-Tab (Isosorbide Mononitrate) .... Take One Tablet By Mouth Daily 10)  Wellbutrin Sr 150 Mg Xr12h-Tab (Bupropion Hcl) .... One Daily For 3 Days Then One Twice A Day 11)  Crestor 5 Mg Tabs (Rosuvastatin Calcium) .... One Tablet Daily  Allergies (verified): 1)  ! Codeine  Past History:  Past Medical History: 1.  CAD:  MI in 2005.  LHC with 50-60% distal LAD stenosis, 60-70% ostial D1 stenosis.  There was also concern for coronary vasospasm.  ETT-myoview (3/10): EF 69%, normal perfusion.  2.  Asthma 3.  HTN 4.  Hyperlipidemia 5.  Elevated LFTs:  Workup included liver biopsy that was unrevealing.  Pt has not been on statin b/c of this.  6.  Smoker.  7.  Migraine headaches.   Family History: Mother is still living.  She is status post PCI in early 35s and status post repair of an abdominal aortic aneurysm in her early 5s.  Father is living.  He is status post coronary artery bypass graft surgery in his 43s and status post repair of abdominal aortic aneurysm in his 31s.  She has one brother and one sister-both living.  In them, there is no coronary disease, diabetes, or hypertension.  Social History: Married, lives in [place] Use - Yes. Pt smokes 1/2 ppd.  Alcohol Use - yes--occasional Drug Use - no Works as a Furniture conservator/restorer.   Review of [place] reviewed and negative except as per HPI.   Vital Signs:  Patient profile:   45 year old female Height:      68 inches Weight:      162 pounds BMI:     24.72 Pulse rate:   75 / minute BP sitting:   140 / 100  (left arm) Cuff size:   large  Vitals Entered By: Lubertha Basque, CNA (May 30, 2009 8:25 AM)  Physical Exam  General:  Well developed, well nourished, in no acute distress. Neck:  Neck supple, no JVD. No masses, thyromegaly or abnormal cervical nodes. Lungs:  Clear bilaterally to auscultation and percussion. Heart:  Non-displaced PMI, chest  non-tender; regular rate and rhythm, S1, S2 without murmurs, rubs or gallops. Carotid upstroke normal, no bruit. Pedals normal pulses. No edema, no varicosities. Abdomen:  Bowel sounds positive; abdomen soft and non-tender. No hepatosplenomegaly.  Abdominal aorta pulsation palpable, difficult to tell but may be borderline enlarged by palpation.  Extremities:  No clubbing or cyanosis. Neurologic:  Alert and oriented x 3. Psych:  depressed affect.     Impression & Recommendations:  Problem # 1:  CHEST PAIN UNSPECIFIED (ICD-786.50) Patient is having increased episodes of mild chest pain.  They do not seem to be exertional.  This is a bit of a difficult situation as she has known moderate CAD but also is suspected to have vasospasm.  Unfortunately, she continues to smoke and her lipids are uncontrolled.  She "sometimes" gets some relief from sublingual NTG.  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  - Lexiscan-myoview (very hard for her to walk) - Imdur 30 mg daily: this could help symptoms if vasospasm (or if due to CAD). - Continue amlodipine for vasospasm - ASA, ACEI - Start statin, as below.   Problem # 2:  FMBWGYKZLDJTTS-VXBLT (JQZ-009.4) LDL was very high in 3/10.  She has been off statin and Zetia due to elevated LFTs.  I have reviewed the GI notes.  I think that she can go back on a statin if we carefully monitor LFTs.  LFT elevation to no more than 2-3 x upper normal would be acceptable.  Will begin Crestor at 5 mg daily and check lipids/LFTs in 6 weeks.    Problem # 3:  ESSENTIAL HYPERTENSION, BENIGN (ICD-401.1) BP high today but did not take her meds.  She will check BP daily after meds for next 2 wks.  We will call to see what it has been running.   Problem # 4:  FATIGUE / MALAISE (ICD-780.79) Patient has profound fatigue.  She has some dyspnea going up steps.  I will check TSH, free T4, and CBC.  It is possible that these symptoms could be due to  depression as well.  I will additionally have her get an echocardiogram to make sure that LV function is normal.  Malignancy could be a consideration given long history of smoking.    Problem # 5:  SMOKING Needs to stop.  Will have her try Wellbutrin (failed patches, Chantix).   Problem # 6:  FAMILY HISTORY OF AAA Patient smokes and her mother had AAA repair in her early 80s.  Father had AAA repair in his 18s.  I can feel her abdominal  aorta pulsation, it could be borderline enlarged.  I will have her get an abdominal US.   Other Orders: EKG w/ Interpretation (93000) Echocardiogram (Echo) Nuclear Stress Test (Nuc Stress Test) Abdominal Aorta Duplex (Abd Aorta Duplex) TLB-BMP (Basic Metabolic Panel-BMET) (46962-XBMWUXL) TLB-CBC Platelet - w/Differential (85025-CBCD) TLB-TSH (Thyroid Stimulating Hormone) (84443-TSH) TLB-T4 (Thyrox), Free [phone]) TLB-Hepatic/Liver Function Pnl (80076-HEPATIC)  Patient Instructions: 1)  Your physician recommends that you return have lab work today ---TSH/Free T4/BMP/Liver pofile/CBC 786.50 401.9 780.79 2)  Your physician has recommended you make the following change in your medication:  3)  Start Imdur 60m daily 4)  Start Crestor 566mdaily 5)  Start Welbutrin 15057maily for 3 days then one twice a day--take them 10 hours apart 6)  Your physician has requested that you have an echocardiogram.  Echocardiography is a painless test that uses sound waves to create images of your heart. It provides your doctor with information about the size and shape of your heart and how well your heart's chambers and valves are working.  This procedure takes approximately one hour. There are no restrictions for this procedure. 7)  Your physician has requested that you have an lexPrestonsburgFor further information please visit [email or web address] follow instruction sheet, as given. 8)  Your physician has requested that you have an abdominal aorta duplex.  During this test, an ultrasound is used to evaluate the aorta. Allow 30 minutes for this exam. Do not eat after midnight the day before and avoid carbonated beverages. There are no restrictions or special instructions. 9)  Take and record your blood pressure---I will call you in 2 weeks to get the readings--Anne Lankford,RN [phone])  Your physician recommends that you schedule a follow-up appointment in: 1 month with Dr. DalLoralie Champagne1)  Your physician recommends that you return for a FASTING lipid profile/liver profile in 6weeks 786.50 401.9 780.79 Prescriptions: CRESTOR 5 MG TABS (ROSUVASTATIN CALCIUM) one tablet daily  #30 x 2   Entered by:   AnnDesiree LucyN, BSN   Authorized by:   DalLoralie ChampagneD   Signed by:   AnnDesiree LucyN, BSN on 05/30/2009   Method used:   [facility]        [id number]       [address]    [place]  [id number]    Ph: [phone]    Fax: [phone]:   [id number] 150 MG XR12H-TAB (BUPROPION HCL) one daily for 3 days then one twice a day  #60 x 2   Entered by:   AnnDesiree LucyN, BSN   Authorized by:   DalLoralie ChampagneD   Signed by:   AnnDesiree LucyN, BSN on 05/30/2009   Method used:   [facility]        [id number]       [address]  [id number]    Ph: [phone]    Fax: [phone]:   [id number] MONONITRATE CR 30 MG XR24H-TAB (ISOSORBIDE MONONITRATE) Take one tablet by mouth daily  #30 x 6   Entered by:   AnnDesiree LucyN, BSN   Authorized by:  Loralie Champagne, MD   Signed by:   Desiree Lucy, RN, BSN on 05/30/2009   Method used:   [facility]        [facility] [id number]       [address]       [place], [place]  [address]       Ph: [phone]       Fax: [phone]   [place]:    [phone]

## 2010-06-01 NOTE — Progress Notes (Signed)
Summary: Nuc pre procedure  Phone Note Outgoing Call Call back at [facility] Phone [phone]   Call placed by: Valetta Fuller, [place],  June 07, 2009 5:02 PM Call placed to: Patient Summary of Call: Reviewed information on Myoview Information Sheet (see scanned document for further details).  Spoke with patient.      Nuclear Med Background Indications for Stress Test: Evaluation for Ischemia   History: Abnormal EKG, Asthma, Heart Catheterization, Myocardial Infarction, Myocardial Perfusion Study  History Comments: '05 MI>Cath: Moderate CAD; '07 MPI: normal,EF=74%  Symptoms: Chest Pain, Chest Tightness, DOE, SOB    Nuclear Pre-Procedure Cardiac Risk Factors: Family History - CAD, Hypertension, Lipids, Overweight, Smoker Height (in): 49  Nuclear Med Study Referring MD:  B. Crenshaw

## 2010-06-29 ENCOUNTER — Emergency Department (HOSPITAL_COMMUNITY): Payer: 59

## 2010-06-29 ENCOUNTER — Inpatient Hospital Stay (HOSPITAL_COMMUNITY)
Admission: EM | Admit: 2010-06-29 | Discharge: 2010-06-30 | DRG: 313 | Disposition: A | Discharge status: Home / Self Care | Payer: 59 | Attending: Internal Medicine | Admitting: Internal Medicine

## 2010-06-29 DIAGNOSIS — R0789 Other chest pain: Principal | ICD-10-CM | POA: Diagnosis present

## 2010-06-29 DIAGNOSIS — I10 Essential (primary) hypertension: Secondary | ICD-10-CM | POA: Diagnosis present

## 2010-06-29 DIAGNOSIS — R079 Chest pain, unspecified: Secondary | ICD-10-CM

## 2010-06-29 DIAGNOSIS — G43909 Migraine, unspecified, not intractable, without status migrainosus: Secondary | ICD-10-CM | POA: Diagnosis present

## 2010-06-29 DIAGNOSIS — F17200 Nicotine dependence, unspecified, uncomplicated: Secondary | ICD-10-CM | POA: Diagnosis present

## 2010-06-29 DIAGNOSIS — Z888 Allergy status to other drugs, medicaments and biological substances status: Secondary | ICD-10-CM

## 2010-06-29 DIAGNOSIS — J45909 Unspecified asthma, uncomplicated: Secondary | ICD-10-CM | POA: Diagnosis present

## 2010-06-29 DIAGNOSIS — E785 Hyperlipidemia, unspecified: Secondary | ICD-10-CM | POA: Diagnosis present

## 2010-06-29 DIAGNOSIS — Z7982 Long term (current) use of aspirin: Secondary | ICD-10-CM

## 2010-06-29 LAB — APTT: aPTT: 26 seconds (ref 24–37)

## 2010-06-29 LAB — PROTIME-INR
INR: 0.9 (ref 0.00–1.49)
Prothrombin Time: 12.4 seconds (ref 11.6–15.2)

## 2010-06-29 LAB — CBC
HCT: 41.2 % (ref 36.0–46.0)
MCHC: 33.3 g/dL (ref 30.0–36.0)
MCV: 85.8 fL (ref 78.0–100.0)
RBC: 4.8 MIL/uL (ref 3.87–5.11)

## 2010-06-29 LAB — TROPONIN I: Troponin I: <0.01 ng/mL (ref 0.00–0.06)

## 2010-06-29 LAB — DIFFERENTIAL
Basophils Relative: 1 % (ref 0–1)
Eosinophils Absolute: 0.1 10*3/uL (ref 0.0–0.7)
Monocytes Relative: 9 % (ref 3–12)

## 2010-06-29 LAB — COMPREHENSIVE METABOLIC PANEL
ALT: 54 U/L — ABNORMAL HIGH (ref 0–35)
BUN: 15 mg/dL (ref 6–23)
Calcium: 9.2 mg/dL (ref 8.4–10.5)
Chloride: 101 mEq/L (ref 96–112)
Creatinine, Ser: 0.81 mg/dL (ref 0.4–1.2)
Potassium: 3.8 mEq/L (ref 3.5–5.1)
Sodium: 141 mEq/L (ref 135–145)

## 2010-06-29 LAB — POCT CARDIAC MARKERS: Troponin i, poc: <0.05 ng/mL (ref 0.00–0.09)

## 2010-06-29 LAB — CK TOTAL AND CKMB (NOT AT ARMC)
CK, MB: 0.5 ng/mL (ref 0.3–4.0)
Relative Index: INVALID (ref 0.0–2.5)

## 2010-06-30 LAB — LIPID PANEL
HDL: 54 mg/dL (ref >39)
Total CHOL/HDL Ratio: 4.3 RATIO
Triglycerides: 149 mg/dL (ref <150)

## 2010-06-30 LAB — CARDIAC PANEL(CRET KIN+CKTOT+MB+TROPI)
CK, MB: 0.5 ng/mL (ref 0.3–4.0)
Troponin I: <0.01 ng/mL (ref 0.00–0.06)

## 2010-07-13 LAB — POCT I-STAT, CHEM 8
BUN: 10 mg/dL (ref 6–23)
Calcium, Ion: 1.08 mmol/L — ABNORMAL LOW (ref 1.12–1.32)
Chloride: 106 mEq/L (ref 96–112)
HCT: 48 % — ABNORMAL HIGH (ref 36.0–46.0)
Sodium: 139 mEq/L (ref 135–145)

## 2010-07-13 LAB — POCT CARDIAC MARKERS
Myoglobin, poc: 25.4 ng/mL (ref 12–200)
Troponin i, poc: <0.05 ng/mL (ref 0.00–0.09)

## 2010-07-13 LAB — BRAIN NATRIURETIC PEPTIDE: Pro B Natriuretic peptide (BNP): 48 pg/mL (ref 0.0–100.0)

## 2010-07-14 NOTE — Discharge Summary (Signed)
NAME:  Janet [name], Janet [name]            ACCOUNT NO.:  [id number]  MEDICAL RECORD NO.:  [id number]           PATIENT TYPE:  I  LOCATION:  3154                         FACILITY:  [facility]  PHYSICIAN:  Anders Hohmann C. Vieva Brummitt, MD, FACCDATE OF BIRTH:  1966/03/27  DATE OF ADMISSION:  06/29/2010 DATE OF DISCHARGE:  06/30/2010                              DISCHARGE SUMMARY   PRIMARY CARDIOLOGIST:  Loralie Champagne, MD  PRIMARY CARE PROVIDER:  Cammie Mcgee. Pedro Earls, MD, [facility].  DISCHARGE DIAGNOSIS:  Chest pain without objective evidence of ischemia.  SECONDARY DIAGNOSES: 1. 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 2. Nonobstructive coronary artery disease by catheterization in July     2005. 3. Hypertension. 4. History of palpitations and premature atrial contractions. 5. Asthma. 6. Hyperlipidemia with statin and Zetia intolerance. 7. History of elevated liver function tests. 8. Tobacco abuse, currently using an e-cigarettes. 9. Migraine headaches.  ALLERGIES:  IMDUR, CODEINE, CRESTOR, ZOCOR, LIPITOR, PRAVASTATIN, and ZETIA.  PROCEDURES:  None.  HISTORY OF PRESENT ILLNESS:  A 45 year old female with prior history of coronary vasospasm who was in her usual state of health until the morning of June 29, 2010, when while sitting in her car, had sudden onset of jaw and neck pressure, shortly thereafter followed by sharp midsternal chest pain different from prior angina.  After about 15-20 minutes of ongoing symptoms, the patient took a sublingual nitroglycerin with complete relief.  She was later seen by primary care provider and was subsequently referred to the [facility] ED.  In the ED, she had nonacute EKG and cardiac markers were negative.  She was admitted for observation.  HOSPITAL COURSE:  The patient has had no recurrent chest pain.  Cardiac enzymes have remained negative x3.  We will plan to discharge her home today in good  condition.  DISCHARGE LABS:  Hemoglobin 13.7, hematocrit 41.2, WBC 7.7, platelets 290, INR 0.90.  Sodium 141, potassium 3.8, chloride 101, CO2 28, BUN 15, creatinine 0.81, glucose 67.  Total bilirubin 0.5, alkaline phosphatase 41, AST 29, ALT 54, total protein 6.8, albumin 4.1, calcium 9.2.  CK 46, MB 0.5, troponin I less than 0.01.  Total cholesterol 230, triglycerides 149, HDL 54, LDL 146.  DISPOSITION:  The patient will be discharged home today in good condition.  FOLLOWUP PLANS AND APPOINTMENTS:  The patient will follow up with Dr. Loralie Champagne on August 01, 2010, at 3:15 p.m.  She is asked to follow up with her primary care provider as previously scheduled.  DISCHARGE MEDICATIONS: 1. Nitroglycerin 0.4 mg sublingual p.r.n. chest pain. 2. Alprazolam 0.25 mg daily p.r.n. 3. Amlodipine 10 mg daily. 4. Aspirin 81 mg daily. 5. Benazepril 20 mg daily. 6. Butorphanol 9 mg/mL b.i.d. p.r.n. headaches.  OUTSTANDING LAB STUDIES:  None.  DURATION OF DISCHARGE ENCOUNTER:  45 minutes including physician time.Murray Hodgkins, ANP   ______________________________ Marijo Conception. Verl Blalock, MD, [facility]    CB/MEDQ  D:  06/30/2010  T:  07/01/2010  Job:  [id number]  cc:   Cletus Gash T. Pedro Earls, MD  Electronically Signed by Murray Hodgkins ANP on 07/04/2010 04:23:58  PM Electronically Signed by Jenell Milliner MD [facility] on 07/14/2010 09:17:21 AM

## 2010-07-21 ENCOUNTER — Encounter: Payer: Self-pay | Admitting: Cardiology

## 2010-07-26 ENCOUNTER — Telehealth: Payer: Self-pay | Admitting: Cardiology

## 2010-07-27 ENCOUNTER — Other Ambulatory Visit (INDEPENDENT_AMBULATORY_CARE_PROVIDER_SITE_OTHER): Payer: 59 | Admitting: *Deleted

## 2010-07-27 DIAGNOSIS — R0789 Other chest pain: Secondary | ICD-10-CM

## 2010-07-27 NOTE — Telephone Encounter (Signed)
I talked with pt . Pt no longer taking Pravastatin. She states she was unable to tolerate this. She has an appointment with Dr. Aundra Dubin 08/01/10. I asked her to come for fasting lipid profile prior to appt with Dr Aundra Dubin on Tuesday. Pt declined lab prior to appt with Dr Aundra Dubin. Pt  states she would like to discuss lab with Dr Aundra Dubin at the time of the appt with him 08/01/10. Dr Aundra Dubin is aware of this plan.

## 2010-07-27 NOTE — H&P (Signed)
NAME:  Janet [name], [name]            ACCOUNT NO.:  [id number]  MEDICAL RECORD NO.:  [id number]           PATIENT TYPE:  I  LOCATION:  3706                         FACILITY:  [facility]  PHYSICIAN:  Shaune Pascal. Bensimhon, MDDATE OF BIRTH:  [birth year]  DATE OF ADMISSION:  06/29/2010 DATE OF DISCHARGE:                             HISTORY & PHYSICAL   PRIMARY CARE PROVIDER:  [place].  PRIMARY CARDIOLOGIST:  Loralie Champagne, MD  PATIENT PROFILE:  A 45 year old Caucasian female with the prior history of coronary vasospasm and non-ST-elevation MI in 2005, who presents with recurrent chest pain.  PROBLEMS: 1. Chest pain/presumed history of coronary vasospasm.     a.     Catheterization in July 2005, secondary to acute coronary      syndrome/non-ST-elevation MI revealing 56% stenosis in the LAD and      67% stenosis in the ostial diagonal.  Otherwise, normal      coronaries.     b.     Negative Myoview in February 2011. 2. Hypertension. 3. History of palpitations, status post monitoring showing PACs. 4. Asthma. 5. Hyperlipidemia. 6. History of elevated LFTs. 7. Tobacco abuse, currently using e-cigarette. 8. Migraine headaches.  ALLERGIES:  IMDUR, CODEINE, CRESTOR, ZOCOR, LIPITOR, and PRAVASTATIN.  HISTORY OF PRESENT ILLNESS:  This is a 45 year old female with history of acute coronary syndrome, non-ST-elevation myocardial infarction in 2005 with catheterization at that time showing nonobstructive disease involving distal LAD and ostial first diagonal.  The patient was treated for presumed coronary vasospasm.  She has done well over the past 7 years with the rare chest pain usually relieved with Xanax.  Her last negative Myoview in February 2011.  Today, she was sitting in her car and had sudden onset of right neck and jaw pain with sensation that a balloon was expanding in her jaw.  This was followed by sharp substernal chest pain, which was different from prior  angina.  She drove about 5 minutes and went into a local olive garden and took a sublingual nitroglycerin with relief of chest pain.  She then addressed her primary care provider who sent her along to [facility] for further evaluation. Cardiac marker is negative so far and ECG was nonacute.  HOME MEDICATIONS: 1. Aspirin 325 mg daily. 2. Nitroglycerin p.r.n. 3. Norvasc 10 mg daily. 4. Benazepril 20 mg daily. 5. Xanax 0.25 mg p.r.n. 6. Vitamin D3 10,000 units 2-3 times per day. 7. Potassium 99 mg p.r.n. 8. Vitamin D 40 mg daily. 9. Vitamin A 10,000 units daily.  FAMILY HISTORY:  Janet [name] is alive in her 61s with the history of coronary artery disease and AAA.  Janet [name] is alive with history of CABG in his 50s, and AAA in 55s.  She has a brother and sister who is alive and well.  SOCIAL HISTORY:  She lives in [place] with her husband.  She is currently in school for mechanical drafting, previously worked as a Furniture conservator/restorer.  She has a 30-pack-year history of tobacco abuse, currently smoking e-cigarettes with 1 cigarette lasting about 2-3 days.  She occasionally has wine.  She denies drug use.  She does  not routinely exercise.  REVIEW OF SYSTEMS:  Positive for chest pain, dyspnea, cough for 2 weeks. She has had some pain behind her left knee.  She is a full code. Otherwise, all systems reviewed negative.  PHYSICAL EXAMINATION:  VITAL SIGNS:  Temperature 97.9, heart rate 68, respirations 18, blood pressure 153/98, pulse ox 95% on room air. GENERAL:  A pleasant white female, in no acute distress, awake, alert, and oriented x3.  She has normal affect. HEENT:  Normal. NEUROLOGIC:  Grossly intact and nonfocal. SKIN:  Warm and dry without lesions or masses. NECK:  Supple without bruits or JVD. LUNGS:  Respirations are regular and unlabored.  Clear to auscultation. CARDIAC:  Regular, S1 and S2.  No S3, S4 murmurs. ABDOMEN:  Round, soft, nontender, and nondistended.  Bowel sounds present  x4. EXTREMITIES:  Warm, dry, pink.  No clubbing, cyanosis, or edema. Dorsalis pedis, posterior tibial pulses 2+ and equal bilaterally.  Chest x-ray  shows stable cardiopulmonary appearance with no new focal or acute abnormality.  EKG shows sinus rhythm, rate 65.  Left axis deviation, no acute ST-T changes.  Hemoglobin 13.7, hematocrit 41.2, WBC 10.7, platelets 290.  Sodium 141, potassium 3.8, chloride 101, CO2 of 20, BUN 15, creatinine 0.1, glucose 57, CK-MB less than 1.0.  Troponin-I less than 0.05.  INR is 0.90.  ASSESSMENT:  Chest pain with history of coronary vasospasm.  The patient had nitrate responsive chest pain this afternoon, that resolved in about 15-30 minutes.  She is currently pain free.  ECG was nonacute.  Enzymes are negative so far.  We plan to admit/observe/cycle enzymes.  Continue home meds.  Last Myoview was in February 2011, and was negative.  If enzymes remain negative, we will consider repeat outpatient Myoview.    Murray Hodgkins, ANP   ______________________________ Shaune Pascal. Bensimhon, MD   CB/MEDQ  D:  06/29/2010  T:  06/30/2010  Job:  [id number]  Electronically Signed by Murray Hodgkins ANP on 07/04/2010 04:23:48 PM Electronically Signed by Glori Bickers MD on 07/27/2010 06:44:25 PM

## 2010-08-01 ENCOUNTER — Ambulatory Visit (INDEPENDENT_AMBULATORY_CARE_PROVIDER_SITE_OTHER): Payer: 59 | Admitting: Cardiology

## 2010-08-01 ENCOUNTER — Encounter: Payer: Self-pay | Admitting: Cardiology

## 2010-08-01 DIAGNOSIS — E7800 Pure hypercholesterolemia, unspecified: Secondary | ICD-10-CM

## 2010-08-01 DIAGNOSIS — R002 Palpitations: Secondary | ICD-10-CM

## 2010-08-01 DIAGNOSIS — E785 Hyperlipidemia, unspecified: Secondary | ICD-10-CM

## 2010-08-01 DIAGNOSIS — I2510 Atherosclerotic heart disease of native coronary artery without angina pectoris: Secondary | ICD-10-CM

## 2010-08-01 DIAGNOSIS — R079 Chest pain, unspecified: Secondary | ICD-10-CM

## 2010-08-01 DIAGNOSIS — F17200 Nicotine dependence, unspecified, uncomplicated: Secondary | ICD-10-CM

## 2010-08-01 MED ORDER — ASPIRIN EC 81 MG PO TBEC: 81.0000 mg | DELAYED_RELEASE_TABLET | Freq: Every day | ORAL | Status: DC

## 2010-08-01 MED ORDER — METOPROLOL TARTRATE 25 MG PO TABS: ORAL_TABLET | ORAL | Status: DC

## 2010-08-01 NOTE — Progress Notes (Signed)
PCP: Dr. 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  Pt has been having episodes of for years.  She had an MI in 2005 with 60-70% distal LAD stenosis and 60-70% ostial D1 stenosis on cath.  There was no culprit lesion for MI.  It was thought that her symptoms might be due to vasospasm.  She has had chest pain on and off for years.  ETT-myoview in 2/11 showed no ischemia or infarction.   Patient continues to have palpitations. She was set up with an event monitor in 9/11, which showed occasional PACs and PVCs, no significant arrhythmias.  Patient had an episode of sudden chest pain while sitting in her car earlier this month.  She went to the ER and was admitted overnight.  ECGs and cardiac enyzmes were unremarkable.  Patient walks and works in garden.  No exertional chest pain and no significant exertional dyspnea.    Patient did not tolerate pravastatin due to severe fatigue and myalgias despite use of coenzyme Q 10.    Patient has quit smoking using an electronic cigarette.  Labs (3/10): LDL 192, HDL 39, LFTs normal Labs (1/11): K 3.9, creatinine 0.7, HCT 46, LFTs normal, TSH normal, free T4 normal Labs (3/11): LDL 80, HDL 66 Labs (1/12)(: LDL 216, HDL 53 Labs (3/12): AST 41, ALT 50, HDL 54, LDL 146  Allergies:  1)  ! Codeine 2)  ! * Isosorbide 3)  ! * Crestor  Past Medical History: 1.  CAD:  MI in 2005.  LHC with 50-60% distal LAD stenosis, 60-70% ostial D1 stenosis.  There was also concern for coronary vasospasm.  ETT-myoview (3/10): EF 69%, normal perfusion.   Lexiscan myoview (2/11): EF 67%, no ischemia or infarction.  Echo (2/11): Mild LVH, EF 50-55%, apical septal hypokinesis.  Chest pain may be due to vasospasm versus microvascular angina.  2.  Asthma 3.  HTN 4.  Hyperlipidemia: Has not tolerate any statin, even with coenzyme Q 10.  5.  Elevated LFTs:  Workup included liver biopsy that was unrevealing.    6.  Smoker.  7.  Migraine  headaches.  8.  Abdominal US (2/11): no AAA 9.  Palpitations: Event monitor (9/11) showed occasional PACs and PVCs.   Family History: Mother is still living.  She is status post PCI in early 75s and status post repair of an abdominal aortic aneurysm in her early 12s.  Father is living.  He is status post coronary artery bypass graft surgery in his 7s and status post repair of abdominal aortic aneurysm in his 68s.  She has one brother and one sister-both living.  In them, there is no coronary disease, diabetes, or hypertension.  Social History: Married, lives in [place] Use - Quit 2012 (electronic cigarette).   Alcohol Use - yes--occasional Drug Use - no Works as a Furniture conservator/restorer.   Review of [facility] reviewed and negative except as per HPI.   Current Outpatient Prescriptions  Medication Sig Dispense Refill  . ALPRAZolam (XANAX) 0.25 MG tablet Take 0.25 mg by mouth at bedtime as needed.        Marland Kitchen amLODipine (NORVASC) 10 MG tablet Take 10 mg by mouth daily.        . benazepril (LOTENSIN) 20 MG tablet Take 20 mg by mouth daily.        . Cholecalciferol (VITAMIN D3) 10000 UNITS capsule 2-3 capsules a week       .  Coenzyme Q10 (CO Q 10) 100 MG CAPS Take 1 capsule by mouth daily.        . nitroGLYCERIN (NITROSTAT) 0.4 MG SL tablet Place 0.4 mg under the tongue every 5 (five) minutes as needed.        . Potassium 99 MG TABS Take by mouth as needed.        . vitamin A 10000 UNIT capsule Take 10,000 Units by mouth every 30 (thirty) days.       . vitamin E 400 UNIT capsule Take 400 Units by mouth daily.        Marland Kitchen aspirin EC 81 MG EC tablet Take 1 tablet (81 mg total) by mouth daily.  150 tablet  2  . metoprolol tartrate (LOPRESSOR) 25 MG tablet 1/2 tablet twice a day as needed for palpitations  30 tablet  11   BP 116/84  Pulse 71  Resp 18  Ht 5' 8"  (1.727 m)  Wt 173 lb 6.4 oz (78.654 kg)  BMI 26.37 kg/m2 General:  Well developed, well nourished, in no acute  distress. Neck:  Neck supple, no JVD. No masses, thyromegaly or abnormal cervical nodes. Lungs:  Clear bilaterally to auscultation and percussion. Heart:  Non-displaced PMI, chest non-tender; regular rate and rhythm, S1, S2 without murmurs, rubs or gallops. Carotid upstroke normal, no bruit. Pedals normal pulses. No edema, no varicosities. Abdomen:  Bowel sounds positive; abdomen soft and non-tender without masses, organomegaly, or hernias noted. No hepatosplenomegaly. Extremities:  No clubbing or cyanosis. Neurologic:  Alert and oriented x 3. Psych:  Normal affect.

## 2010-08-01 NOTE — Patient Instructions (Signed)
Decrease Apsirin to 49m daily--this should be enteric coated.  Use metoprolol tartrate(lopressor) 12.556mtwice a day as needed for palpitations.  Fasting lab in 3 months--lipid profile/liver profile 272.0  Appointment with Dr McAundra Dubinn 1 year (APRIL 2013)

## 2010-08-02 DIAGNOSIS — R002 Palpitations: Secondary | ICD-10-CM | POA: Insufficient documentation

## 2010-08-02 NOTE — Assessment & Plan Note (Signed)
Patient has quit smoking.  I congratulated her on this.

## 2010-08-02 NOTE — Assessment & Plan Note (Addendum)
Patient was not able to tolerate pravastatin.  Her LDL gets up above 200 when she is not on medication.  Goal LDL < 70 due to coronary disease.  I will have her try Niaspan at 500 mg qhs for the first week, advancing to 1000 mg daily.  She should take ASA 30 minutes before Niaspan. Lipids/LFTs in 3 months.

## 2010-08-02 NOTE — Assessment & Plan Note (Addendum)
Moderate nonobstructive CAD on prior cath with recent normal myoview.  I think that her chest pain may be related to either coronary vasospasm or microvascular angina.  Continue ASA, ACEI.  Not able to tolerate statin.  OK to take ASA 81 mg daily rather than 325.

## 2010-08-02 NOTE — Assessment & Plan Note (Signed)
Patient continues to have occasional bothersome palpitations.  She had an event monitor showing PACs and PVCs.  I will give her a prescription for a low dose of metoprolol to use as needed.

## 2010-09-15 NOTE — Assessment & Plan Note (Signed)
[facility]                              CARDIOLOGY OFFICE NOTE   NAME:[name], Janet PAULHUS                   MRN:          [id number]  DATE:02/20/2006                            DOB:          1965/11/10    HISTORY OF PRESENT ILLNESS:  Janet [name] is a pleasant 45 year old female  who has a history of coronary disease, elevated liver functions,  hypertension and migraines.  Since I last saw her, she denies any dyspnea on  exertion, orthopnea, PND, pedal edema, palpitations, presyncope or syncope.  She has occasionally had chest pain which is in the epigastric area and  described as a tightness.  It is not exertional nor is it pleuritic in  position.  It lasts for several minutes and resolves spontaneously.   MEDICATIONS:  1. Aspirin.  2. Multivitamin.   PHYSICAL EXAMINATION:  VITAL SIGNS:  Blood pressure 136/82, pulse 72, weight  163 pounds.  NECK:  Supple.  No bruits.  CHEST:  Clear.  CARDIOVASCULAR:  Regular rate and rhythm.  ABDOMEN:  Benign.  EXTREMITIES:  No edema.   STUDIES:  Electrocardiogram shows a sinus rhythm at a rate of 72.  There is  anterior and inferior T wave inversion.  It is unchanged from previous.   DIAGNOSES:  1. Atypical chest pain.  2. History of coronary disease.  3. History of elevated liver functions.  4. Hypertension.  5. History of migraines.  6. Tobacco abuse.   PLAN:  Janet [name] is complaining of chest pain that is somewhat atypical.  Her electrocardiogram is unchanged.  We will plan to proceed with the stress  Myoview for risk stratification.  If it shows normal perfusion, then we will  continue with medical therapy.  She will continue on her aspirin.  Her blood  pressure is well controlled today, and she is not on the beta blocker as her  previous catheterization revealed coronary disease with possible vasospasm.  Note:  She has had increased liver functions in the past on Statins.  We  will therefore  check lipids and liver today.  For LDL's greater than 70,  then we will plan to begin Zetia 10 mg p.o. daily.  She was to follow up  with Dr. Carlean Purl for possible liver biopsy, but she has not done this.  I discussed the importance of diet and exercise.  She has resumed her  smoking, and I discussed the importance of discontinuing that.  We will see  her back in nine months.    ______________________________  Denice Bors. Stanford Breed, MD, [facility]    BSC/MedQ  DD: 02/20/2006  DT: 02/21/2006  Job #: [id number]

## 2010-09-15 NOTE — Op Note (Signed)
NAME:  Janet [name], Janet [name] NO.:  [id number]   MEDICAL RECORD NO.:  [id number]          PATIENT TYPE:  AMB   LOCATION:  [place]                           FACILITY:  [facility]   PHYSICIAN:  Freda Munro, M.D.    DATE OF BIRTH:  [birth year]   DATE OF PROCEDURE:  01/17/2004  DATE OF DISCHARGE:                                 OPERATIVE REPORT   PREOPERATIVE DIAGNOSIS:  Menorrhagia.   POSTOPERATIVE DIAGNOSIS:  Menorrhagia.   PROCEDURE:  1.  Dilation and curettage.  2.  Novasure endometrial ablation.   SURGEON:  Freda Munro, M.D.   ANESTHESIA:  General.   ANTIBIOTICS:  Ancef 1 g.   DRAINS:  Red rubber catheter.   ESTIMATED BLOOD LOSS:  :  Minimal.   SPECIMENS:  Endometrial curettings to pathology.   COMPLICATIONS:  None.   DESCRIPTION OF PROCEDURE:  The patient was taken to the operating room where  a general anesthetic was administered without complications.  She was then  placed in the dorsal lithotomy position.  She was prepped with Hibiclens and  draped in the usual fashion for this procedure.  Her bladder was drained  with a red rubber catheter.  Pelvic exam revealed an anteverted uterus of  normal size and shape.  A sterile speculum was placed in the vagina.  A  single-tooth tenaculum was applied to the anterior cervical lip.  The cervix  was then dilated to a 39 [place].  The sound was placed into the uterine  cavity which then measured 8 cm.  An size 8 Hager dilator was then used to  measure the cervical length which was 3 cm giving a cavity length of 5 cm.  At this point, the cervix was then dilated to a 58 [place].  The Novasure  device was placed into the uterine cavity and the seating procedure  followed.  The width of the uterine cavity was 2.8 cm.  At this point, the  seal was set and the integrity of the uterine cavity checked and found to be  normal.  At this point, the Novasure device was turned for a total of 1  minute and 40  seconds for a total  power of 77.  The procedure was concluded at this time.  The patient was taken to the recovery room in stable condition.  Instrument  and lap counts were correct x1.  The patient was discharged to home.  She  was sent home with Advil and Demerol to take p.r.n.  She was instructed to  follow up in the office in four weeks.      MA/MEDQ  D:  01/17/2004  T:  01/17/2004  Job:  [id number]

## 2010-09-15 NOTE — H&P (Signed)
NAME:  Janet [name], Janet [name]                      ACCOUNT NO.:  [id number]   MEDICAL RECORD NO.:  [id number]                   PATIENT TYPE:  INP   LOCATION:  3703                                 FACILITY:  [facility]   PHYSICIAN:  Kirk Ruths, M.D. [facility]            DATE OF BIRTH:  10-17-1965   DATE OF ADMISSION:  11/19/2003  DATE OF DISCHARGE:                                HISTORY & PHYSICAL   PRIMARY CARE PHYSICIAN:  Janet [name] with Dr. Celesta [name].   REASON FOR ADMISSION:  I have had some fatigue lately and chest pain  today.   HISTORY OF PRESENT ILLNESS:  [name] is a 45 year old female.  She has no prior known cardiac history.  She does give a history of stress  and fatigue for the last two years associated with her job but she has  recently quit.  She has been scheduled to see Dr. Marcello [name] C. Wall, November 24, 2003, on referral from Dr. Celesta [name].  Her main complaint, aside from the  overall fatigue, is a feeling that she is having skipped beats and then  slamming beats.  She also relates that two to three months ago she had an  episode of syncope.  She had a very short prodromal warning.  She felt dizzy  and then went out.  Her husband caught her on the way down.  She says she  was out about a minute.  She was groggy coming around.  She had no loss of  bowel or bladder function.  This has not happened since.   However, this morning, she was awakened with centralized chest pain.  The  pain did not radiate and she had no diaphoresis.  She felt dizzy, wheezing,  and slightly dyspneic.  She got up, showered, fed her horse, and the pain  persisted.  She rates it as an 8 over 10 in its persistence.  She called Dr.  Celesta [name] at [place] and they recommended that she come  to [facility] Emergency Department.   In the emergency room on examination, the patient is still having chest  discomfort.  She is mildly hypertensive even  though she has taken her  morning dose of Avalide.  She will receive four baby aspirin, nitroglycerin  sublingually, and a 12.5 mg of Lopressor.   ALLERGIES:  No known drug allergies.   MEDICATIONS:  1. Avalide 150/12.5 mg q.d.  2. Zomig 2.5 mg as needed for headache.   PAST MEDICAL HISTORY:  1. Hypertension diagnosed seven years ago.  2. Fairly strong family history of coronary artery disease, hypertension,     abdominal aortic aneurysm in both parents.  3. Previous history of menstrual migraines.  4. Chronic lower extremity pain.  5. Syncopal episode two to three months ago.  6. Palpitations.   PAST SURGICAL HISTORY:  1. Status post abdominoplasty.  2. Status post tubal ligation.  3.  History of bladder tacking.  4. Horse related fractures in the past.  The patient keeps five horses and     is an avid horse woman.   SOCIAL HISTORY:  The patient lives in [place] with her husband of  6.5 years.  She has a son by a previous marriage.  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  She currently does smoke one pack per  day.  Rarely drinks alcohol, though she had two margaritas last night.  No  recreational drug, though she has a remote history of cocaine use.   FAMILY HISTORY:  Mother is still living.  She is status post stenting and  status post R&G of the abdominal aortic aneurysm.  Father is living.  He is  status post coronary artery bypass graft surgery and status post R&G of  abdominal aortic aneurysm.  She has one brother and one sister-both living.  In them, there is no representation of coronary disease, diabetes, or  hypertension.   REVIEW OF SYMPTOMS:  The patient is not having fever, chills, sweats, weight  change, or adenopathy.  HEENT:  No epistaxis, no voice changes, vertigo,  photophobia.  INTEGUMENT:  Bottom of her feet get hot to the touch.  CARDIOPULMONARY:  She is having chest pain as described above with shortness  of breath.   She has mild edema at times and palpitations as described above.  She also details an episode of syncope two to three months ago.  GENITOURINARY:  The patient has irregular periods and they are heavy.  They  started in January of this year.  NEUROPSYCHIATRIC:  The patient has been  feeling fatigue.  This has progressed over the past two years.  MUSCULOSKELETAL:  Her legs ache all the way down at night.  It seems to back  of the lower extremities and stretches from the hips down to the ankles.  GASTROINTESTINAL:  The patient has no chronic heartburn.  No history of GI  bleeding, no melena, no hematemesis.  ENDOCRINE:  The patient has no  diagnosis of diabetes or thyroid disease.  In addition, the patient has  never had a heart attack, stroke, seizure, pulmonary embolus, deep vein  thrombosis, gastrointestinal bleed, peptic ulcer disease.   PHYSICAL EXAMINATION:  VITAL SIGNS:  Temperature 98.2, pulse 67 and regular,  respirations 16, blood pressure 142/104.  GENERAL:  The patient is alert and oriented x 3.  She still has chest  discomfort, although it is easing somewhat.  HEENT:  Normocephalic and atraumatic.  Eyes:  Pupils are equal, round, and  reactive to light.  Extraocular movements intact.  Sclerae are clear. Mucus  membranes are pink, moist, and without lesions or erythema.  The patient  does not wear dentures.  NECK:  Supple.  No carotid bruits auscultated.  No jugular venous  distention.  No cervical lymphadenopathy.  HEART:  Regular rate and rhythm with S1 and S2 clearly auscultated.  LUNGS:  Clear to auscultation and percussion bilaterally.  INTEGUMENT:  No rashes or lesions.  ABDOMEN:  Soft and nondistended.  Bowel sounds are present.  No rebound or  guarding.  No hepatosplenomegaly and normal.  Aorta is nonpulsatile and  nonexpansile.  GENITAL:  Deferred.  RECTAL:  Deferred.  EXTREMITIES:  No clubbing, cyanosis, edema, rashes, or lesions. PULSES:  Radial pulses are 4/4  bilaterally.  Dorsalis pedis pulses are 4/4  bilaterally.  MUSCULOSKELETAL:  No joint deformity or effusions.  NEUROLOGICAL:  No  focal deficit noted.   LABORATORY DATA:  Chest x-ray shows heart size normal.  Lungs clear  bilaterally and no acute disease.  Electrocardiogram shows the rate is 66  and in sinus rhythm, axis is 0.  There are no Q-waves.  There are T-wave  inversions in III, V1 and V2.  Criteria are met for hypertrophy.   Hemoglobin is 15.6, hematocrit 46.  Serum electrolytes show a sodium 140,  potassium 3.5, chloride 105, bicarbonate  27.9, BUN is 9.  I am unable to  find creatinine results.  First set enzymes at point of care at 1054 hours  in the morning show a myoglobin of 42.5, CK-MB is less than 1, troponin I  less than 0.05.   ASSESSMENT:  1. Admitted with chest pain.  2. Hypertension.  3. Family history of coronary artery disease and abdominal aortic aneurysm     in both mother and father.  4. Migraine headaches, menstrual.  5. Syncopal episode two to three months ago.  6. Feels heart skip and slam at times.   PLAN:  Dr. Kirk Ruths has seen this patient and has reviewed her medical  history.  His plan is as follows:  In light of the fact that she is  developing substernal chest pain which feels like a squeezing without  radiation but having shortness of breath and nausea and with a duration of  four hours.  She also has a history of recent palpitations and one syncopal  episode two months ago.  It is thought that the patient should have  generalized cardiac workup.  Her electrocardiogram is nondiagnostic and her  first set of enzymes are negative but we will admit the patient, start her  on aspirin, Lopressor, and IV heparin.  If her enzymes are negative, she  will have an outpatient Cardiolite which has already been scheduled for  Wednesday, November 24, 2003 and then follow up with Dr. Kirk Ruths.  If the  enzymes are positive, she will have a left heart  catheterization.  She may  need an outpatient event monitor if Cardiolite is negative secondary to  recent syncopal episode.      Sueanne Margarita, P.A.                    Kirk Ruths, M.D. [facility]    GM/MEDQ  D:  11/19/2003  T:  11/19/2003  Job:  [id number]

## 2010-09-15 NOTE — Discharge Summary (Signed)
NAME:  Janet [name], Janet [name]                      ACCOUNT NO.:  [id number]   MEDICAL RECORD NO.:  [id number]                   PATIENT TYPE:  INP   LOCATION:  3703                                 FACILITY:  [facility]   PHYSICIAN:  Kirk Ruths, M.D. [facility]            DATE OF BIRTH:  1966/03/18   DATE OF ADMISSION:  11/19/2003  DATE OF DISCHARGE:                                 DISCHARGE SUMMARY   DISCHARGE DIAGNOSES:  1. Admitted with chest pain, elevated troponin studies 0.17, 0.25, then     0.26, thought to be acute coronary syndrome.  2. Left heart catheterization July 25 with finding of nonobstructive     coronary disease.  3. Elevated liver function studies.  AST is 140, ALT 179.  4. Abdominal ultrasound in setting of elevated liver function studies.     Liver had no focal abnormality, no biliary obstruction.  Abnormality     lower left kidney.  5. CT of abdomen with contrast to evaluate renal mass.  Study shows no     evidence of renal mass; however, formation consistent with dromedary hump     by normal variance.  6. Hematology consult for persistent leukocytosis.   SECONDARY DIAGNOSES:  1. Hypertension.  2. Family history of coronary artery disease, hypertension, and abdominal     aortic aneurysm.  3. History of menstrual migraines.  4. Chronic lower extremity pain.   PAST SURGICAL HISTORY:  1. Abdominoplasty.  2. Tubal ligation.  3. Bladder tacking.  4. Multiple fractures related to horsemanship.   PROCEDURES:  1. Left heart catheterization November 22, 2003, Dr. Elta Guadeloupe Pulsipher.  This study     showed an ostial 60 to 70% stenosis in the first diagonal.  Also, the     distal LAD had a 50% long stenosis.  Ejection fraction 65 to 70%.  Left     circumflex without significant disease.  Right coronary artery without     significant disease.  Possible chest pain etiology is spasm.  Some     suggestion of nitrate therapy.  2. Abdominal ultrasound on November 22, 2003, with findings as  dictated above,     liver with nonfocal abnormality, no biliary obstruction, and an     abnormality in the lower left kidney consistent with dromedary hump, but     needs rule out for renal mass.  3. CT of the abdomen November 23, 2003.  No renal mass, finding of dromedary     hump, and normal variant in the left kidney.  4. History of adverse lipid profile; however, with elevated LFTs, thoughts     of starting a statin have been placed on hold until liver function     studies have been repeated.   PLAN:  1. Home July 26.  2. Hematology consult for persistent leukocytosis to be arranged through     [facility] office.  3. Office visit with Dr. Kirk Ruths in two  weeks.  This appointment will     be relayed to the patient after discharge.  4. Repeat liver function studies in six to eight weeks with appropriate     followup.  5. Access chronic hepatitis profile which is pending at the time of this     discharge.  6. If liver function studies remain elevated after six to eight weeks, the     patient will have a GI evaluation.  If they have returned to normal, then     we will add a statin.  7. Possible outpatient event monitor for episode of syncope.   DISCHARGE DISPOSITION:  Janet [name] is ready for discharge after  undergoing CT of the abdomen on July 26.  She has had no persistent chest  pain or recurrent syncope while at [facility].  She is maintaining  a sinus rhythm.  Her chest is clear.  She has had no respiratory  decompensation.  Her blood pressure is well controlled.   DISCHARGE MEDICATIONS:  1. Enteric-coated aspirin 325 mg daily.  2. Avalide 150/12.5 mg daily.  3. Potassium chloride 20 mEq daily.  4. Cardizem CD 120 mg daily.  5. Nitroglycerin 0.4 mg 1 tablet under the tongue every 5 minutes x 3 doses     as needed for chest pain.  6. Tylenol 325 mg 1 to 2 tablets every 4 to 6 hours as needed for pain at     the catheterization site.    DISCHARGE INSTRUCTIONS:  1. She is asked please not to engage in any heavy lifting or straining for     the next two weeks.  2. She may drive beginning Thursday, July 28.  3. She may shower.  4. She is to call [phone] if she experiences swelling, increased pain,     redness, or drainage at the catheterization site.   BRIEF HISTORY:  Ms.  [name] is a 45 year old female.  She has no prior known  cardiac history.  She was awaiting a stress study at [facility] for  recent history of skipped heart beats and slamming heart beats.  This was  planned for July 27.  She was in her usual state of health on July 21.  She  went out to dinner, took some Zomig for her menstrual migraine, and had two  margaritas.  On the morning of July 22, she awoke with centralized chest  pain.  She had no radiation or diaphoresis, but she did feel dizzy, queasy,  and had slight dyspnea.  She fed her horse after showering, but the pain  persisted.  It was 8/10. She came to the emergency room after conference  with her family doctor.  She also gives a history of a syncopal episode two  to three months ago.  While drinking a glass of water, she felt dizzy and  then went OUT.  Husband caught her on the way down, but she was out for  about a minute.  She was groggy and confused when she came around.  She had  no loss of bowel or bladder function.  The patient was seen by Dr. Stanford Breed  who noted that initial cardiac enzymes were negative.  The patient will,  however, be admitted, ruled out for myocardial infarction, started on  aspirin, Lopressor, and heparin.  If enzymes are negative, outpatient  Cardiolite as already scheduled.  If they are positive, she will have a  cardiac catheterization.  She may need an outpatient event monitor  if  Cardiolite is negative for recent syncope which may indeed be a vagal event.  HOSPITAL COURSE:  Ms. Shumard was admitted July 22 with and event of  centralized chest pain which  was 8/10.  She was placed on IV heparin.  Her  cardiac enzymes started trending upward on July 23.  They were in serial  fashion 0.12, then 0.25, then 0.26.  For this reason, she was scheduled for  left heart catheterization.  In addition, she was started on a statin due  to adverse lipid profile, but liver function studies were also elevated, so  the statin was held.  A chronic hepatitis profile was obtained.  This is  pending.  An ultrasound of the abdomen was ordered to rule out liver  abnormality or gallbladder dysfunction, and she went for cardiac  catheterization July 25 with the result as dictated above with finding of  nonobstructive coronary artery disease and preserved left ventricular  ejection fraction.  She also had CT of the abdomen after abdominal  ultrasound revealed abnormality in the left kidney.  CT of the abdomen  showed a normal variant in the left kidney called a dromedary hump.   The patient goes home with the medications as dictated above and with  extensive followup for liver function studies as well as for a persistent  leukocytosis.   LABORATORY AND X-RAY DATA:  Cholesterol 210, triglycerides 331, HDL  cholesterol 44, LDL cholesterol 100.  Liver function studies: Alkaline  phosphatase 45, SGOT 140, SGPT 179.  Amylase 94, lipase 27.  Complete blood  count:  White cell count 15.3 at discharge and at the time of admission  ranged 15.7, 14.4 on serial determinations.  TSH is 1.633.  Hepatitis  profile is pending.      Sueanne Margarita, P.A.                    Kirk Ruths, M.D. [facility]    GM/MEDQ  D:  11/23/2003  T:  11/23/2003  Job:  [id number]   cc:   [place]

## 2010-09-15 NOTE — Assessment & Plan Note (Signed)
[facility]                            CARDIOLOGY OFFICE NOTE   NAME:[name], Janet CORLISS                   MRN:          [id number]  DATE:05/08/2006                            DOB:          1965-10-22    CARDIOLOGIST:  Dr. Kirk Ruths.   HISTORY OF PRESENT ILLNESS:  [name] is a 45 year old female patient  followed by Dr. Stanford Breed with a history of moderate non-obstructive  coronary disease and catheterization 2005 with possible superimposed  spasm who returns to the office today with complaints of chest  tightness. She saw Dr. Stanford Breed back in October with complaints of chest  tightness, and he set her up for stress test. She exercised for 8  minutes 15 seconds. She had no ischemia, and her EF was 74%. She returns  to the office today with complaints of worsening chest pain. Over the  last week she has noted chest tightness at rest as well as with  exertion. It lasted about a minute. It radiates up to her neck. She is  slightly short of breath with it. There is no radiation down her arm.  She is slightly diaphoretic, but denies nausea. There has been some  lightheadedness, but she denies syncope or near syncope. If she exerts  herself when she is having the pain, it does not get any worse and it  actually will subside if she continues to exert herself.   CURRENT MEDICATIONS:  1. Multivitamin.  2. Avalide 300/12.5 mg daily.  3. Aspirin 325 mg daily.  4. Zetia 10 mg daily.  5. Zomig p.r.n.  6. Nitroglycerin p.r.n.   ALLERGIES:  CODEINE.   SOCIAL HISTORY:  She continues to smoke cigarettes.   REVIEW OF SYSTEMS:  Please see HPI. Denies fevers, chills, cough,  melena, hematochezia or dysuria . Review of Systems are negative.   FAMILY HISTORY:  Significant for coronary artery disease.   PHYSICAL EXAMINATION:  GENERAL:  She is a well-nourished, well-developed  in no acute distress.  VITAL SIGNS:  Blood pressure 133/93, pulse 71, weight  166 pounds.  HEENT:  Unremarkable.  NECK:  Without JVD.  CARDIAC:  S1, S2, regular rate and rhythm without murmurs, clips, rubs  or gallops.  LUNGS:  Clear to auscultation bilaterally without wheezing, rhonchi or  rales.  ABDOMEN:  Soft, nontender with normoactive bowel sounds. No  organomegaly.  EXTREMITIES:  Without edema. Calves are soft and nontender.  SKIN:  Warm and dry.  NEUROLOGIC:  She is alert and oriented x3. Cranial nerves II-XII are  grossly intact.   Electrocardiogram reveals sinus rhythm with heart rate of 72, left axis  deviation, no significant change in his previous tracings.   IMPRESSION:  1. Chest discomfort.  2. Coronary artery disease.  3. Catheterization 2005 revealing 50-60% stenosis in the distal LAD      and an ostial 60-70% stenosis in the first diagonal and no coronary      artery disease in the epicardial vessels.      a.     Recent nonischemic Myoview.  4. Good LV function.  5. History of elevated LFTs.  6. Treated dyslipidemia.  7. Hypertension.  8. Tobacco abuse.  9. Migraine headaches.   PLAN:  I discussed the patient's case with Dr. Stanford Breed. She is no  longer on Norvasc and cannot remember why she stopped this medication. I  have recommended she go back on Norvasc 5 mg a day. Dr. Stanford Breed agreed.  He will see her back in 2-3 weeks in follow up. She knows to go to the  emergency room if she has worsening chest pain at rest. Otherwise, will  see her back in 2-3 weeks with Dr. Stanford Breed.      Richardson Dopp, PA-C  Electronically Signed      Denice Bors. Stanford Breed, MD, [facility]  Electronically Signed   SW/MedQ  DD: 05/08/2006  DT: 05/08/2006  Job #: [id number]

## 2010-09-15 NOTE — Cardiovascular Report (Signed)
NAME:  Janet [name], Janet [name]                      ACCOUNT NO.:  [id number]   MEDICAL RECORD NO.:  [id number]                   PATIENT TYPE:  INP   LOCATION:  3703                                 FACILITY:  [facility]   PHYSICIAN:  Ernestine Mcmurray, M.D. [facility]             DATE OF BIRTH:  October 17, 1965   DATE OF PROCEDURE:  11/22/2003  DATE OF DISCHARGE:                              CARDIAC CATHETERIZATION   REFERRING PHYSICIAN:  Kirk Ruths, M.D.   PROCEDURES:  1. Left heart catheterization, selective coronary angiography.  2. Ventriculography.   DIAGNOSES:  1. Nonobstructive coronary artery disease.  2. Status post non-ST-elevation myocardial infarction.  3. Normal left ventricular systolic function.   INDICATIONS:  The patient is a 45 year old female with a history of tobacco  use and multiple cardiac risk factors including family history of CAD.  The  patient was admitted with new onset substernal chest pain associated with  EKG changes as well as elevation of cardiac troponin.  She has been referred  for cardiac catheterization to assess her coronary anatomy.   DESCRIPTION OF PROCEDURE:  After informed consent was obtained, the patient  was brought to the catheterization laboratory.  The right groin was  thoroughly prepped and draped.  Initial attempts were made to insert a 6-  French catheter in the right femoral artery; however, we were unable to  thread the [facility] wire, and subsequently we put a 6-French arterial sheath  in the left femoral artery.  This went without difficulties.  The  catheterization was then completed with 6-French JL4 and JR4 catheters as  well as a 6-French angled pigtail catheter, the latter for ventriculography.  At the termination of the procedure, all catheters and sheaths were removed.  No complications were encountered.   FINDINGS:   HEMODYNAMICS:  1. Left ventricular pressure 116/3 mmHg.  2. Aortic pressure 116/78 mmHg.   VENTRICULOGRAPHY:  Ejection  fraction 65 to 70% without segmental wall motion  abnormalities.  No mitral regurgitation.   SELECTIVE CORONARY ANGIOGRAPHY:  1. The left main coronary artery was a large caliber vessel with no evidence     of flow-limiting disease.  2. The left anterior descending artery was a large caliber vessel wrapping     around the apex.  There was no definite flow-limiting disease, but the     distal LAD rapidly tapered, and there was diffuse 50 to 60% stenosis of     the distal segment.  The first diagonal branch also had a 60 to 70%     ostial lesion.  3. The circumflex coronary artery was free of flow-limiting lesions.  4. The right coronary artery was a large caliber vessel without any evidence     of flow-limiting lesions.   RECOMMENDATIONS:  Angiographic results were reviewed with Dr. Lia Foyer.  Although the patient does have atherosclerotic vascular disease, no definite  flow-limiting lesions are identified.  It does appear that the distal LAD  might be the culprit lesion for the non-ST-elevation myocardial infarction.  This vessel, however, distally is very small and diffusely diseased and will  need further medical treatment.  The patient will be also placed on calcium  channel blocker in the event she has spasm secondary to tobacco use, and Dr.  Stanford Breed will decide on the timing when to start a statin medication.  We  will stop the beta blocker in the interim.                                               Ernestine Mcmurray, M.D. [facility]    GED/MEDQ  D:  11/22/2003  T:  11/22/2003  Job:  [id number]   cc:   Kirk Ruths, M.D. [facility]

## 2010-09-15 NOTE — Assessment & Plan Note (Signed)
[facility]                         GASTROENTEROLOGY OFFICE NOTE   NAME:Janet [name], Janet [name]                   MRN:          [id number]  DATE:05/28/2006                            DOB:          Sep 20, 1965    CHIEF COMPLAINT:  Abnormal LFTs.   I have not seen Janet [name] in some time.  She had previously had a  serologic workup for LFT abnormalities that was negative, including  sprue panel.  Janet [name] LFTs actually normalized at one point, then were  elevated somewhat.  Recent followup with Dr. Stanford Breed on May 03, 2006, shows AFT of 54, ALT 69.  Janet [name] albumin was down to 3.4.  Previously  she had an ALT of 45, but otherwise normal LFTs.  She has been taking  Zetia.  She feels okay.  Since I saw Janet [name] a year ago she fell and had a  compression fracture that was treated conservatively.  She was lifted up  by Janet [name] horse and she fell.  In the past I have suggested that she  consider a liver biopsy.  She has had abnormal LFTs off and on for 10  years, she tells me.  She does not really drink alcohol.  There is no  family history of liver disease.   MEDICATIONS:  Reviewed and listed in the chart.   PAST MEDICAL HISTORY:  Reviewed and unchanged other than those things  noted.   PHYSICAL EXAMINATION:  Weight 168 pounds, pulse 74, blood pressure  120/80.  EYES:  Anicteric.  SKIN:  Shows some spider angiomata in the upper chest area in the  posterior area and anterior.  ABDOMEN:  Soft, nontender without hepatosplenomegaly or mass.  The palms are free of palmar erythema or contractures.  EXTREMITIES:  Do not show edema.   ASSESSMENT:  Chronic mild elevation of transaminases, unclear etiology.  Abdominal imaging with ultrasound and CT scanning in the past (2005) has  not shown any liver abnormality.  I am somewhat concerned by the spider  angiomata.  Perhaps she has occult liver disease.  Etiology would be  unclear at this point.  She does not seem to have  steatohepatitis.  However, despite the imaging not showing that, I suppose it is possible.   RECOMMENDATIONS AND PLAN:  I have recommended a liver biopsy to this  lady.  The Zetia could be causing some trouble, but she has had LFT  abnormalities without statins or other medications in the past, and I  think it would be useful to clear this up.  Risks, benefits and  indications of liver biopsy explained to the patient.  CBC with diff and  protime, PTT will be checked today.  Interventional Radiology will be  contacted about the biopsy.  She wishes to be sedated.     Gatha Mayer, MD,FACG  Electronically Signed   CEG/MedQ  DD: 05/28/2006  DT: 05/29/2006  Job #: [id number]

## 2010-09-15 NOTE — Assessment & Plan Note (Signed)
[facility]                            CARDIOLOGY OFFICE NOTE   NAME:[name], Janet MUSICK                   MRN:          [id number]  DATE:05/31/2006                            DOB:          1965/12/22    Janet [name] is a pleasant female who has a history of coronary disease  with superimposed vasospasm.  Her most recent nuclear study was  performed in October.  At that time, she had an ejection fraction of 74%  and normal perfusion.  She was seen recently, on May 08, 2006, by  Richardson Dopp for atypical chest pain.  However, she had discontinued the  Norvasc.  This was resumed and she has had no chest pain since then.  There is no dyspnea, palpitations, syncope or pedal edema.   MEDICATIONS AT PRESENT:  1. Include multivitamin, Avalide 300/12.5 mg tablets 1 p.o. daily.  2. Aspirin 325 mg p.o. daily.  3. Zetia 10 mg p.o. daily.  4. Norvasc 5 mg p.o. daily.   PHYSICAL EXAMINATION:  Her physical exam today shows a blood pressure of  122/84 and a pulse of 78.  NECK:  Her neck is supple with no bruits.  CHEST:  Her chest is clear.  CARDIOVASCULAR EXAM:  Reveals a regular rate and rhythm.  EXTREMITIES:  Show no edema.   DIAGNOSES:  1. Coronary artery disease with history of mild superimposed      vasospasm.  2. History of elevated liver functions.  3. Hypertension.  4. Tobacco abuse.  5. History of migraines.   PLAN:  Janet [name] is doing well from the symptomatic standpoint with no  recurrent chest pain since her Norvasc was initiated.  Her nuclear study  in October showed normal LV function and normal perfusion.  We will,  therefore, not pursue further cardiac evaluation at this time.  She has  resumed her tobacco use and I have asked her to discontinue that again.  She is on Zetia and we will check a CMET today to follow her potassium,  liver functions.  We will also check lipids.  Note:  I have not added a  statin, as she has had elevated  liver functions in the past.  She is  scheduled for a liver biopsy with Dr. Carlean Purl.  We can consider adding  the statin in the future, depending on these results.  She will  otherwise continue with diet and exercise and I will see her back in six  months.    Janet Bors Stanford Breed, MD, [facility]  Electronically Signed   BSC/MedQ  DD: 05/31/2006  DT: 05/31/2006  Job #: [id number]

## 2010-09-15 NOTE — Assessment & Plan Note (Signed)
[facility]                         GASTROENTEROLOGY OFFICE NOTE   NAME:Merwin, Janet [name]                   MRN:          [id number]  DATE:06/10/2006                            DOB:          1965-10-08    I called [name] today to let her know that her liver biopsy was  normal. The official result was benign hepatic parenchyma. I told her  that perhaps her abnormal transaminases were related to medication or  for other reasons that are not entirely clear. At this point, this does  not seem to be a significant health problem.   I have recommended she continue to take her Zetia and other lipid  medication as prescribed by Dr. Stanford Breed. My recommendations would be to  follow her liver chemistries every 6 weeks at this point. This could be  spread out if they stabilize or normalize. Sometimes, medications will  cause an elevation in transaminases and the liver will adapt to the  medications and things will normalize. As long as they are not more than  2-3 times abnormal and there are no obvious symptoms or signs of  problems, I would continue her cholesterol medication if it is felt  medically appropriate. I can see her back as needed.     Gatha Mayer, MD,FACG  Electronically Signed    CEG/MedQ  DD: 06/10/2006  DT: 06/11/2006  Job #: [id number]   cc:   Denice Bors. Stanford Breed, MD, [facility]

## 2010-09-15 NOTE — Op Note (Signed)
[place]  Patient:    Janet [name], Janet [name] Visit Number: [id number] MRN: [id number]          Service Type: DSU Location: [place] 05 Attending Physician:  Abran Duke Dictated by:   Earnie Larsson, M.D. Proc. 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  PROCEDURE:  C5-6 and C6-7 anterior cervical diskectomy and fusion with allograft and anterior plate instrumentation.  SURGEON:  Earnie Larsson, M.D.  ASSISTANT:  Kary Kos, Brooke Bonito., M.D.  ANESTHESIA:  General endotracheal.  INDICATION:  Ms. Ekstrand is a 45 year old female with a history of neck and left upper extremity pain, paresthesias, and weakness most consistent with a left-sided C7 radiculopathy but also with some features of a left-sided C6 radiculopathy as well.  The patient has failed conservative management.  MRI scan demonstrates a left-sided C6-7 disk herniation into the foramen causing compression in the left-sided C7 nerve root.  She also has evidence of a left-sided C5-6 paramedian disk herniation with some early spinal cord compression.  We discussed the options available for management, including possibly undergoing a C5-6 and C6-7 anterior cervical diskectomy and fusion with allograft and anterior plating for hopeful improvement of her symptoms. The patient wishes to proceed with surgery.  DESCRIPTION OF PROCEDURE:  The patient was taken to the operating room and placed on the operating table in the supine position.  After an adequate level of anesthesia achieved, the patient was positioned with her neck slightly extended, held in place with Holter traction.  The patients anterior cervical region was prepped and draped in sterilely.  A 10  blade was used to make a linear skin incision overlying the C6 vertebral level.  This was carried down sharply to the platysma.  The platysma was then divided vertically. Dissection then proceeds along the medial border of the sternocleidomastoid muscle and carotid sheath on the right side.  Trachea and esophagus mobilized and directed toward the left.  Prevertebral fascia stripped off the anterior spinal column.  Longus colli muscles were then elevated bilaterally using electrocautery.  Deep self-retaining retractor was placed, and intraoperative x-ray was taken.  The level was confirmed.  The disk spaces at C5-6 and C6-7 were then incised with a 15 blade in rectangular fashion.  A wide disk space clean-out was then achieved using pituitary rongeurs, forward and backward-angled Karlin curettes, Kerrison rongeurs, and a high-speed drill. All elements of the disk were removed down to the level of the posterior annulus.  The microscope was brought into the field and throughout the remainder of the diskectomy.  Starting first at C5-6, the remaining aspects of annulus and osteophytes were removed down to the level of the posterior longitudinal ligament using the high-speed drill.  The posterior longitudinal ligament was then elevated and resected in piecemeal fashion using Kerrison rongeurs.  The underlying thecal sac was identified.  A wide central decompression was then performed using Kerrison rongeurs to undercut the bodies of C5 and C6.  Decompression then proceeded out to the left-sided C6 foramen.  The left-sided C6 nerve root was identified and widely decompressed within its foramen.  Decompression then proceeded out to the right-sided C6 foramen,  where once again the C6 nerve root was identified proximally.  There was no evidence of any compression here.  The wound was then irrigated with antibiotic solution.  Gelfoam was placed topically for hemostasis.  Attention was then placed at  C6-7.  The remaining aspects of the osteophytes and annulus were removed down to the level of the posterior longitudinal ligament using the high-speed drill.  The posterior longitudinal ligament was then elevated and resected in piecemeal fashion using the Kerrison rongeurs.  The underlying thecal sac was identified.  A wide central decompression was then performed using the Kerrison rongeurs to undercut the bodies of C6 and C7. Decompression then proceeded out to the left-sided C7 foramen.  The C7 nerve root was identified proximally.  There was a moderate amount of free disk herniation within the left-sided C7 foramen.  This was dissected free using Black nerve hooks and Kerrison rongeurs.  All elements of the disk herniation were completely resected.  The C7 nerve root was now widely decompressed throughout its foramen.  Decompression then proceeded into the right-sided C7 foramen.  The C7 nerve root was identified and found to be free of any compression.  the wound was then copiously irrigated with antibiotic solution.  Gelfoam was placed topically for hemostasis.  Turning first to C5-6, the disk space was distracted and a 6 mm patellar wedge allograft was impacted in place and recessed approximately 1 mm from the anterior cortical surface.  At C6-7, a 7 mm patellar wedge allograft was then impacted into place and recessed approximately 1 mm from the anterior cortical surface.  A 42 mm Atlantis anterior cervical plate was then placed to the C5, C6, and C7 levels.  This was then attached under fluoroscopic guidance utilizing 13 mm variable-angled screws, two each at C5, C6, and C7.  All screws were found to be well-positioned.  They were solidly purchased into bone.  Locking screws were engaged at all three levels.  Final images revealed good position of the bone grafts and hardware, proper operative level, with normal alignment of the spine.  The wound was irrigated with antibiotic  solution.  Hemostasis was ensured with bipolar electrocautery.  The wound was then closed in typical fashion.  Steri-Strips and sterile dressing were applied.  There were no  apparent complications.  The patient tolerated the procedure well, and she returns to the recovery room postoperatively. Dictated by:   Earnie Larsson, M.D. Attending Physician:  Abran Duke DD:  04/02/01 TD:  04/02/01 Job: [id number]

## 2010-10-30 ENCOUNTER — Other Ambulatory Visit: Payer: 59 | Admitting: *Deleted

## 2010-11-06 ENCOUNTER — Encounter: Payer: Self-pay | Admitting: Cardiology

## 2010-11-09 ENCOUNTER — Other Ambulatory Visit (INDEPENDENT_AMBULATORY_CARE_PROVIDER_SITE_OTHER): Payer: 59 | Admitting: *Deleted

## 2010-11-09 ENCOUNTER — Other Ambulatory Visit: Payer: Self-pay | Admitting: Cardiology

## 2010-11-09 DIAGNOSIS — E7800 Pure hypercholesterolemia, unspecified: Secondary | ICD-10-CM

## 2010-11-09 LAB — LIPID PANEL: Cholesterol: 292 mg/dL — ABNORMAL HIGH (ref 0–200)

## 2010-11-09 LAB — HEPATIC FUNCTION PANEL
AST: 30 U/L (ref 0–37)
Albumin: 4.3 g/dL (ref 3.5–5.2)
Alkaline Phosphatase: 42 U/L (ref 39–117)
Total Protein: 7 g/dL (ref 6.0–8.3)

## 2010-11-13 ENCOUNTER — Telehealth: Payer: Self-pay | Admitting: *Deleted

## 2010-11-13 DIAGNOSIS — E7800 Pure hypercholesterolemia, unspecified: Secondary | ICD-10-CM

## 2010-11-13 MED ORDER — EZETIMIBE 10 MG PO TABS: 10.0000 mg | ORAL_TABLET | Freq: Every day | ORAL | Status: DC

## 2010-11-13 NOTE — Telephone Encounter (Signed)
Message copied by Katrine Coho on Mon Nov 13, 2010 10:28 AM ------      Message from: Larey Dresser      Created: Sun Nov 12, 2010 12:30 AM       LDL extremely high.  Cannot tolerate statins.  Would increase Niaspan to 2000 mg daily and add Zetia 10 mg daily.

## 2010-11-13 NOTE — Telephone Encounter (Signed)
otes Recorded by Katrine Coho, RN on 11/13/2010 at 10:28 AM Discussed results with pt. Pt will increase Niaspan to 2023m daily--she is tolerating current dose-- and start Zetia 159mdaily. She will return for a fasting lipid/liver profile 01/09/11. Notes Recorded by DaLoralie ChampagneMD on 11/12/2010 at 12:30 AM LDL extremely high. Cannot tolerate statins. Would increase Niaspan to 2000 mg daily and add Zetia 10 mg daily.

## 2010-11-21 ENCOUNTER — Telehealth: Payer: Self-pay | Admitting: Cardiology

## 2010-11-21 NOTE — Telephone Encounter (Signed)
Pt called and the Zetia  Will cost her a lot of money.  If you want her to take this, she needs samples. Please call her and let her know what to do.

## 2010-11-22 NOTE — Telephone Encounter (Signed)
Per pt call, returning call to Shellee Milo, please return call to pt.

## 2010-11-22 NOTE — Telephone Encounter (Signed)
LMTCB

## 2010-11-22 NOTE — Telephone Encounter (Signed)
I talked with pt. Pt states a prescription for Zetia will be $134. I verified with pt's pharmacy that this was correct and was not on her formulary. I offered pt a Zetia card for $20 off a prescription. Pt states she still would not be able to afford Zetia. Pt will contact her insurance company to ask them if there is an alternative. Pt has been unable to tolerate statins in the past.

## 2010-11-23 NOTE — Telephone Encounter (Signed)
I reviewed with Dr Aundra Dubin. He is aware pt will not be able to afford Zetia. He recommended pt continue Niaspan.

## 2010-11-27 NOTE — Telephone Encounter (Signed)
LMTCB

## 2010-11-30 ENCOUNTER — Other Ambulatory Visit: Payer: Self-pay | Admitting: Obstetrics and Gynecology

## 2011-03-19 ENCOUNTER — Ambulatory Visit (INDEPENDENT_AMBULATORY_CARE_PROVIDER_SITE_OTHER): Payer: 59 | Admitting: Cardiology

## 2011-03-19 ENCOUNTER — Encounter: Payer: Self-pay | Admitting: Cardiology

## 2011-03-19 DIAGNOSIS — I2510 Atherosclerotic heart disease of native coronary artery without angina pectoris: Secondary | ICD-10-CM

## 2011-03-19 DIAGNOSIS — R002 Palpitations: Secondary | ICD-10-CM

## 2011-03-19 DIAGNOSIS — E7800 Pure hypercholesterolemia, unspecified: Secondary | ICD-10-CM

## 2011-03-19 DIAGNOSIS — E785 Hyperlipidemia, unspecified: Secondary | ICD-10-CM

## 2011-03-19 MED ORDER — METOPROLOL TARTRATE 25 MG PO TABS: 25.0000 mg | ORAL_TABLET | Freq: Two times a day (BID) | ORAL | Status: DC

## 2011-03-19 NOTE — Patient Instructions (Signed)
Start metoprolol tartrate (lopressor) 35m twice a day.  Decrease your caffeine intake.  Your physician recommends that you return for a FASTING lipid profile /liver profile in 1 month 785.1  414.01 272.0  Your physician wants you to follow-up in: 6 months with Dr MAundra Dubin (May 2013). You will receive a reminder letter in the mail two months in advance. If you don't receive a letter, please call our office to schedule the follow-up appointment.

## 2011-03-20 NOTE — Assessment & Plan Note (Signed)
Unable to tolerate statins.  Has been able to take Niaspan but not very compliant with it.  LDL is very high, goal LDL would be < 70 given moderate CAD.  She agrees to take 2000 mg Niaspan daily regularly.  Will get lipids/LFTs in 1 month.

## 2011-03-20 NOTE — Progress Notes (Signed)
PCP: Dr. 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  Pt has been having episodes of for years.  She had an MI in 2005 with 60-70% distal LAD stenosis and 60-70% ostial D1 stenosis on cath.  There was no culprit lesion for MI.  It was thought that her symptoms might be due to vasospasm.  She has had chest pain on and off for years.  ETT-myoview in 2/11 showed no ischemia or infarction.   Patient continues to have palpitations. She was set up with an event monitor in 9/11, which showed occasional PACs and PVCs, no significant arrhythmias.  Palpitations have been worse recently; she is under stress at school.  She is drinking 2 cups of coffee in the morning.  She continues to be abstinent from tobacco.   No chest pain.  Mild dyspnea when she walks up hills.  She has not been able to tolerate any statins.  She can tolerate Niaspan but has not been very compliant with it.   ECG: NSR with PVCs, left axis, LVH, slight anterior T wave inversions  Labs (3/10): LDL 192, HDL 39, LFTs normal Labs (1/11): K 3.9, creatinine 0.7, HCT 46, LFTs normal, TSH normal, free T4 normal Labs (3/11): LDL 80, HDL 66 Labs (1/12)(: LDL 216, HDL 53 Labs (3/12): AST 41, ALT 50, HDL 54, LDL 146 Labs (7/12): HDL 53, LDL 233, ALT 43  Allergies:  1)  ! Codeine 2)  ! * Isosorbide 3)  ! * Crestor  Past Medical History: 1.  CAD:  MI in 2005.  LHC with 50-60% distal LAD stenosis, 60-70% ostial D1 stenosis.  There was also concern for coronary vasospasm.  ETT-myoview (3/10): EF 69%, normal perfusion.   Lexiscan myoview (2/11): EF 67%, no ischemia or infarction.  Echo (2/11): Mild LVH, EF 50-55%, apical septal hypokinesis.  Chest pain may be due to vasospasm versus microvascular angina.  2.  Asthma 3.  HTN 4.  Hyperlipidemia: Has not tolerate any statin, even with coenzyme Q 10.  5.  Elevated LFTs:  Workup included liver biopsy that was unrevealing.    6.  Smoker.  7.  Migraine  headaches.  8.  Abdominal US (2/11): no AAA 9.  Palpitations: Event monitor (9/11) showed occasional PACs and PVCs.   Family History: Mother is still living.  She is status post PCI in early 46s and status post repair of an abdominal aortic aneurysm in her early 38s.  Father is living.  He is status post coronary artery bypass graft surgery in his 54s and status post repair of abdominal aortic aneurysm in his 92s.  She has one brother and one sister-both living.  In them, there is no coronary disease, diabetes, or hypertension.  Social History: Married, lives in [place] Use - Quit 2012 (electronic cigarette).   Alcohol Use - yes--occasional Drug Use - no Works as a Furniture conservator/restorer.   Review of [place] reviewed and negative except as per HPI.   Current Outpatient Prescriptions  Medication Sig Dispense Refill  . ALPRAZolam (XANAX) 0.25 MG tablet Take 0.25 mg by mouth at bedtime as needed.        Marland Kitchen amLODipine (NORVASC) 10 MG tablet Take 10 mg by mouth daily.        Marland Kitchen aspirin EC 81 MG EC tablet Take 1 tablet (81 mg total) by mouth daily.  150 tablet  2  . benazepril (  LOTENSIN) 20 MG tablet Take 20 mg by mouth daily.        . nitroGLYCERIN (NITROSTAT) 0.4 MG SL tablet Place 0.4 mg under the tongue every 5 (five) minutes as needed.        . Cholecalciferol (VITAMIN D3) 10000 UNITS capsule 2-3 capsules a week       . Coenzyme Q10 (CO Q 10) 100 MG CAPS Take 1 capsule by mouth daily.        . metoprolol tartrate (LOPRESSOR) 25 MG tablet Take 1 tablet (25 mg total) by mouth 2 (two) times daily.  60 tablet  6  . niacin (NIASPAN) 1000 MG CR tablet Take one twice a day      . Potassium 99 MG TABS Take by mouth as needed.        . vitamin A 10000 UNIT capsule Take 10,000 Units by mouth every 30 (thirty) days.       . vitamin E 400 UNIT capsule Take 400 Units by mouth daily.         BP 146/80  Pulse 64  Ht 5' 8"  (1.727 m)  Wt 78.472 kg (173 lb)  BMI 26.30  kg/m2 General:  Well developed, well nourished, in no acute distress. Neck:  Neck supple, no JVD. No masses, thyromegaly or abnormal cervical nodes. Lungs:  Clear bilaterally to auscultation and percussion. Heart:  Non-displaced PMI, chest non-tender; regular rate and rhythm, S1, S2 without murmurs, rubs or gallops. Carotid upstroke normal, no bruit. Pedals normal pulses. No edema, no varicosities. Abdomen:  Bowel sounds positive; abdomen soft and non-tender without masses, organomegaly, or hernias noted. No hepatosplenomegaly. Extremities:  No clubbing or cyanosis. Neurologic:  Alert and oriented x 3. Psych:  Normal affect.

## 2011-03-20 NOTE — Assessment & Plan Note (Signed)
Moderate nonobstructive CAD on prior cath with recent normal myoview.  I think that her chest pain may be related to either coronary vasospasm or microvascular angina.  Continue ASA, ACEI.  Not able to tolerate statin.  Taking Niaspan.

## 2011-03-20 NOTE — Assessment & Plan Note (Signed)
Palpitations from PACs and PVCs.  Increased with stress, more regular caffeine.   She will try switching to decaf coffee.  I will also have her start metoprolol 25 mg bid (will also help with blood pressure).    Followup in 6 months.

## 2011-04-18 ENCOUNTER — Other Ambulatory Visit (INDEPENDENT_AMBULATORY_CARE_PROVIDER_SITE_OTHER): Payer: 59 | Admitting: *Deleted

## 2011-04-18 ENCOUNTER — Other Ambulatory Visit: Payer: Self-pay | Admitting: Cardiology

## 2011-04-18 DIAGNOSIS — E7800 Pure hypercholesterolemia, unspecified: Secondary | ICD-10-CM

## 2011-04-18 LAB — LIPID PANEL
HDL: 52.7 mg/dL (ref >39.00)
Triglycerides: 210 mg/dL — ABNORMAL HIGH (ref 0.0–149.0)

## 2011-04-18 LAB — HEPATIC FUNCTION PANEL
Albumin: 3.9 g/dL (ref 3.5–5.2)
Total Protein: 7.1 g/dL (ref 6.0–8.3)

## 2011-04-23 ENCOUNTER — Telehealth: Payer: Self-pay | Admitting: *Deleted

## 2011-04-23 DIAGNOSIS — R748 Abnormal levels of other serum enzymes: Secondary | ICD-10-CM

## 2011-04-23 NOTE — Telephone Encounter (Signed)
Notes Recorded by Katrine Coho, RN on 04/23/2011 at 12:34 PM Discussed with pt. She will return 04/27/11 for anti-HCV antibody, HBsAg and HBsAb Notes Recorded by Loralie Champagne, MD on 04/22/2011 at 11:16 PM Mild LFT elevation, has been present in the past. Probably fatty liver but would screen with anti-HCV antibody, HBsAg, HBsAb

## 2011-04-26 ENCOUNTER — Telehealth: Payer: Self-pay | Admitting: Cardiology

## 2011-04-26 NOTE — Telephone Encounter (Signed)
Knew about liver biopsy, it was unrevealing.  If she knows she has been tested for hepatitis in the past and it was negative, does not need retesting.  I do not see any results for testing .

## 2011-04-26 NOTE — Telephone Encounter (Signed)
Spoke with pt, she wanted to make sure dr Aundra Dubin knew she had a liver biopsy in 2008. She will still come in for blood work tomorrow but wanted to make sure we were aware. Will make dr Aundra Dubin aware

## 2011-04-26 NOTE — Telephone Encounter (Signed)
Patient called, she can't remember being tested for hepatitis.She will have lab work tomorrow as planned.

## 2011-04-26 NOTE — Telephone Encounter (Signed)
New Msg: Pt calling stating that she has recently had a liver biopsy and want to make sure nurse/md are aware of that considering with lab work tomorrow md/nurse wanted pt tested for hepatitis. [place] return pt call to discuss further if necessary.

## 2011-04-27 ENCOUNTER — Other Ambulatory Visit: Payer: 59 | Admitting: *Deleted

## 2011-04-27 DIAGNOSIS — R748 Abnormal levels of other serum enzymes: Secondary | ICD-10-CM

## 2011-04-28 LAB — HEPATITIS B SURFACE ANTIBODY,QUALITATIVE: Hep B S Ab: NEGATIVE

## 2011-04-28 LAB — HEPATITIS C ANTIBODY: HCV Ab: NEGATIVE

## 2011-10-16 ENCOUNTER — Encounter: Payer: Self-pay | Admitting: *Deleted

## 2011-10-17 ENCOUNTER — Encounter: Payer: Self-pay | Admitting: Cardiology

## 2011-10-17 ENCOUNTER — Ambulatory Visit (INDEPENDENT_AMBULATORY_CARE_PROVIDER_SITE_OTHER): Payer: 59 | Admitting: Cardiology

## 2011-10-17 VITALS — BP 128/80 | HR 74 | Ht 68.0 in | Wt 173.0 lb

## 2011-10-17 DIAGNOSIS — E785 Hyperlipidemia, unspecified: Secondary | ICD-10-CM

## 2011-10-17 DIAGNOSIS — I2510 Atherosclerotic heart disease of native coronary artery without angina pectoris: Secondary | ICD-10-CM

## 2011-10-17 DIAGNOSIS — F17200 Nicotine dependence, unspecified, uncomplicated: Secondary | ICD-10-CM

## 2011-10-17 DIAGNOSIS — E7800 Pure hypercholesterolemia, unspecified: Secondary | ICD-10-CM

## 2011-10-17 MED ORDER — BENAZEPRIL HCL 20 MG PO TABS: 20.0000 mg | ORAL_TABLET | Freq: Every day | ORAL | Status: DC

## 2011-10-17 MED ORDER — ROSUVASTATIN CALCIUM 5 MG PO TABS: 5.0000 mg | ORAL_TABLET | Freq: Every day | ORAL | Status: DC

## 2011-10-17 MED ORDER — AMLODIPINE BESYLATE 10 MG PO TABS: 10.0000 mg | ORAL_TABLET | Freq: Every day | ORAL | Status: DC

## 2011-10-17 MED ORDER — METOPROLOL TARTRATE 25 MG PO TABS: 25.0000 mg | ORAL_TABLET | Freq: Two times a day (BID) | ORAL | Status: DC

## 2011-10-17 NOTE — Patient Instructions (Addendum)
Start crestor 76m daily.  Your physician recommends that you return for a FASTING lipid profile /liver profile in 2 months.  Your physician wants you to follow-up in: 6 months with Dr MAundra Dubin (December 2013). You will receive a reminder letter in the mail two months in advance. If you don't receive a letter, please call our office to schedule the follow-up appointment.

## 2011-10-19 NOTE — Progress Notes (Signed)
Patient ID: Janet [name], female   DOB: 11-Jun-1965, 46 y.o.   MRN: [id number] PCP: Dr. Dennard Schaumann  46 y.o. with history of moderate CAD and possible vasospasm returns for followup.  Pt has been having episodes of chest pain for years.  She had an MI in 2005 with 60-70% distal LAD stenosis and 60-70% ostial D1 stenosis on cath.  There was no culprit lesion for MI.  It was thought that her symptoms might be due to vasospasm.  ETT-myoview in 2/11 showed no ischemia or infarction.   Main cardiac symptom recently had been palpitations.  This seems to have improved.  She is on metoprolol.  No syncope/lightheadedness.  She continues to have rare episodes of mild atypical chest pain, no severe episode recently.  No significant exertional dyspnea.    ECG: NSR, poor anterior R wave progression, LAFB  Labs (3/10): LDL 192, HDL 39, LFTs normal Labs (1/11): K 3.9, creatinine 0.7, HCT 46, LFTs normal, TSH normal, free T4 normal Labs (3/11): LDL 80, HDL 66 Labs (1/12)(: LDL 216, HDL 53 Labs (3/12): AST 41, ALT 50, HDL 54, LDL 146 Labs (7/12): HDL 53, LDL 233, ALT 43 Labs (12/12): TGs 210, HDL 53, AST 39, ALT 59, LDL 202  Allergies:  1)  ! Codeine 2)  ! * Isosorbide 3)  ! * Crestor  Past Medical History: 1.  CAD:  MI in 2005.  LHC with 50-60% distal LAD stenosis, 60-70% ostial D1 stenosis.  There was also concern for coronary vasospasm.  ETT-myoview (3/10): EF 69%, normal perfusion.   Lexiscan myoview (2/11): EF 67%, no ischemia or infarction.  Echo (2/11): Mild LVH, EF 50-55%, apical septal hypokinesis.  Chest pain may be due to vasospasm versus microvascular angina.  2.  Asthma 3.  HTN 4.  Hyperlipidemia: Has not tolerated several statins, even with coenzyme Q 10.  5.  Elevated LFTs:  Workup included liver biopsy that was unrevealing.    6.  Smoker.  7.  Migraine headaches.  8.  Abdominal US (2/11): no AAA 9.  Palpitations: Event monitor (9/11) showed occasional PACs and PVCs.   Family  History: Mother is still living.  She is status post PCI in early 66s and status post repair of an abdominal aortic aneurysm in her early 70s.  Father is living.  He is status post coronary artery bypass graft surgery in his 15s and status post repair of abdominal aortic aneurysm in his 81s.  She has one brother and one sister-both living.  In them, there is no coronary disease, diabetes, or hypertension.  Social History: Married, lives in [place] Use - Quit 2012 (electronic cigarette).   Alcohol Use - yes--occasional Drug Use - no Works as a Furniture conservator/restorer.   Review of [place] reviewed and negative except as per HPI.   Current Outpatient Prescriptions  Medication Sig Dispense Refill  . ALPRAZolam (XANAX) 0.25 MG tablet Take 0.25 mg by mouth at bedtime as needed.        Marland Kitchen amLODipine (NORVASC) 10 MG tablet Take 1 tablet (10 mg total) by mouth daily.  90 tablet  3  . benazepril (LOTENSIN) 20 MG tablet Take 1 tablet (20 mg total) by mouth daily.  90 tablet  3  . Cholecalciferol (VITAMIN D3) 10000 UNITS capsule 2-3 capsules a week       . Coenzyme Q10 (CO Q 10) 100 MG CAPS Take 1 capsule by mouth daily.        Marland Kitchen  metoprolol tartrate (LOPRESSOR) 25 MG tablet Take 1 tablet (25 mg total) by mouth 2 (two) times daily.  180 tablet  3  . niacin (NIASPAN) 1000 MG CR tablet Take one twice a day      . nitroGLYCERIN (NITROSTAT) 0.4 MG SL tablet Place 0.4 mg under the tongue every 5 (five) minutes as needed.        . Potassium 99 MG TABS Take by mouth as needed.        Marland Kitchen aspirin EC 81 MG EC tablet Take 1 tablet (81 mg total) by mouth daily.  150 tablet  2  . aspirin EC 81 MG tablet Take 1 tablet (81 mg total) by mouth daily.      Marland Kitchen omeprazole (PRILOSEC) 40 MG capsule Take 1 tablet by mouth daily.      . rosuvastatin (CRESTOR) 5 MG tablet Take 1 tablet (5 mg total) by mouth daily.  90 tablet  0   BP 128/80  Pulse 74  Ht 5' 8"  (1.727 m)  Wt 78.472 kg (173 lb)  BMI 26.30  kg/m2 General:  Well developed, well nourished, in no acute distress. Neck:  Neck supple, no JVD. No masses, thyromegaly or abnormal cervical nodes. Lungs:  Clear bilaterally to auscultation and percussion. Heart:  Non-displaced PMI, chest non-tender; regular rate and rhythm, S1, S2 without murmurs, rubs or gallops. Carotid upstroke normal, no bruit. Pedals normal pulses. No edema, no varicosities. Abdomen:  Bowel sounds positive; abdomen soft and non-tender without masses, organomegaly, or hernias noted. No hepatosplenomegaly. Extremities:  No clubbing or cyanosis. Neurologic:  Alert and oriented x 3. Psych:  Normal affect.

## 2011-10-19 NOTE — Assessment & Plan Note (Signed)
Moderate nonobstructive CAD on prior cath with recent normal myoview.  I think that her chest pain may be related to either coronary vasospasm or microvascular angina.  Continue ASA, ACEI.  She is on amlodipine for potential vasospasm.  I am going to try to start her on a statin (has not taken Crestor in the past).

## 2011-10-19 NOTE — Assessment & Plan Note (Signed)
LDL has been very high but she has not tolerated statins in the past.  She is on Niaspan.  I am going to try her on a low dose of Crestor, 5 mg daily.  She has not taken Crestor before.  Ideally, LDL would be < 70 given her history of nonobstructive CAD.  Will need lipids/LFTs in 2 months.

## 2011-10-19 NOTE — Assessment & Plan Note (Signed)
She is staying off cigarettes.

## 2011-12-17 ENCOUNTER — Other Ambulatory Visit (INDEPENDENT_AMBULATORY_CARE_PROVIDER_SITE_OTHER): Payer: 59

## 2011-12-17 DIAGNOSIS — E7800 Pure hypercholesterolemia, unspecified: Secondary | ICD-10-CM

## 2011-12-17 DIAGNOSIS — I2510 Atherosclerotic heart disease of native coronary artery without angina pectoris: Secondary | ICD-10-CM

## 2011-12-17 LAB — LIPID PANEL
Cholesterol: 258 mg/dL — ABNORMAL HIGH (ref 0–200)
HDL: 65.2 mg/dL (ref >39.00)
Triglycerides: 152 mg/dL — ABNORMAL HIGH (ref 0.0–149.0)
VLDL: 30.4 mg/dL (ref 0.0–40.0)

## 2011-12-17 LAB — HEPATIC FUNCTION PANEL: Albumin: 4.4 g/dL (ref 3.5–5.2)

## 2012-08-07 ENCOUNTER — Ambulatory Visit (INDEPENDENT_AMBULATORY_CARE_PROVIDER_SITE_OTHER): Payer: 59 | Admitting: Cardiology

## 2012-08-07 ENCOUNTER — Encounter: Payer: Self-pay | Admitting: Cardiology

## 2012-08-07 VITALS — BP 144/94 | HR 69 | Ht 68.0 in | Wt 173.0 lb

## 2012-08-07 DIAGNOSIS — E785 Hyperlipidemia, unspecified: Secondary | ICD-10-CM

## 2012-08-07 DIAGNOSIS — G459 Transient cerebral ischemic attack, unspecified: Secondary | ICD-10-CM

## 2012-08-07 DIAGNOSIS — R079 Chest pain, unspecified: Secondary | ICD-10-CM

## 2012-08-07 MED ORDER — ROSUVASTATIN CALCIUM 5 MG PO TABS: 5.0000 mg | ORAL_TABLET | Freq: Every day | ORAL | Status: DC

## 2012-08-07 MED ORDER — CLOPIDOGREL BISULFATE 75 MG PO TABS: ORAL_TABLET | ORAL | Status: DC

## 2012-08-07 NOTE — Patient Instructions (Addendum)
Stop aspirin.  Start Plavix. Take 4 (total 3365m) 772mtablets at the same time the first day, then take 1 daily.  Start crestor 65m30maily.  Your physician has requested that you have a carotid duplex. This test is an ultrasound of the carotid arteries in your neck. It looks at blood flow through these arteries that supply the brain with blood. Allow one hour for this exam. There are no restrictions or special instructions.  Your physician has requested that you have en exercise stress myoview. For further information please visit [email or web address] follow instruction sheet, as given.  Your physician recommends that you return for a FASTING lipid profile /liver profile in 2 months.   Your physician recommends that you schedule a follow-up appointment in: 3 months with Dr McLAundra Dubin

## 2012-08-08 DIAGNOSIS — G459 Transient cerebral ischemic attack, unspecified: Secondary | ICD-10-CM | POA: Insufficient documentation

## 2012-08-08 NOTE — Progress Notes (Signed)
Patient ID: Janet [name], female   DOB: Jan 31, 1966, 47 y.o.   MRN: [id number] PCP: Dr. Dennard Schaumann  47 yo with history of moderate CAD and possible vasospasm returns for followup.  Pt has been having episodes of chest pain for years.  She had an MI in 2005 with 60-70% distal LAD stenosis and 60-70% ostial D1 stenosis on cath.  There was no culprit lesion for MI.  It was thought that her symptoms might be due to vasospasm.  ETT-myoview in 2/11 showed no ischemia or infarction.  She has a new job now doing Engineer, mining.  She has been having more chest pain recently in the setting of stress at work.  This will occur 1-2 times a week.  She does not have much exertional chest pain. She had one severe episode 2-3 weeks ago while sitting at her desk at work.  It resolved with NTG.    3-4 weeks ago, she developed right arm and right sided facial numbness.  She felt like there was a line down the middle of her face.  This lasted < 10 minutes then resolved.  She did not go to the hospital at that time.  She has had no recurrence.    She is no longer taking Crestor, and LDL is very high. BP is high this morning but she did not take her morning BP meds.   ECG: NSR, poor anterior R wave progression, LAFB  Labs (3/10): LDL 192, HDL 39, LFTs normal Labs (1/11): K 3.9, creatinine 0.7, HCT 46, LFTs normal, TSH normal, free T4 normal Labs (3/11): LDL 80, HDL 66 Labs (1/12)(: LDL 216, HDL 53 Labs (3/12): AST 41, ALT 50, HDL 54, LDL 146 Labs (7/12): HDL 53, LDL 233, ALT 43 Labs (12/12): TGs 210, HDL 53, AST 39, ALT 59, LDL 202 Labs (  Allergies:  1)  ! Codeine 2)  ! * Isosorbide 3)  ! * Crestor  Past Medical History: 1.  CAD:  MI in 2005.  LHC with 50-60% distal LAD stenosis, 60-70% ostial D1 stenosis.  There was also concern for coronary vasospasm.  ETT-myoview (3/10): EF 69%, normal perfusion.   Lexiscan myoview (2/11): EF 67%, no ischemia or infarction.  Echo (2/11): Mild LVH, EF 50-55%, apical septal  hypokinesis.  Chest pain may be due to vasospasm versus microvascular angina. Headaches with Imdur.  2.  Asthma 3.  HTN 4.  Hyperlipidemia: Has not tolerated several statins, even with coenzyme Q 10.  5.  Elevated LFTs:  Workup included liver biopsy that was unrevealing.    6.  Smoker.  7.  Migraine headaches.  8.  Abdominal US (2/11): no AAA 9.  Palpitations: Event monitor (9/11) showed occasional PACs and PVCs.  10. TIA 3/14  Family History: Mother is still living.  She is status post PCI in early 38s and status post repair of an abdominal aortic aneurysm in her early 68s.  Father is living.  He is status post coronary artery bypass graft surgery in his 1s and status post repair of abdominal aortic aneurysm in his 37s.  She has one brother and one sister-both living.  In them, there is no coronary disease, diabetes, or hypertension.  Social History: Married, lives in [place] Use - Quit 2012 (electronic cigarette).   Alcohol Use - yes--occasional Drug Use - no Works in Engineer, mining.   Review of Systems        All systems reviewed and negative except as per HPI.  Current Outpatient Prescriptions  Medication Sig Dispense Refill  . ALPRAZolam (XANAX) 0.25 MG tablet Take 0.25 mg by mouth at bedtime as needed.        Marland Kitchen amLODipine (NORVASC) 10 MG tablet Take 1 tablet (10 mg total) by mouth daily.  90 tablet  3  . benazepril (LOTENSIN) 20 MG tablet Take 1 tablet (20 mg total) by mouth daily.  90 tablet  3  . Cholecalciferol (VITAMIN D3) 10000 UNITS capsule 2-3 capsules a week       . Coenzyme Q10 (CO Q 10) 100 MG CAPS Take 1 capsule by mouth daily.        . metoprolol tartrate (LOPRESSOR) 25 MG tablet Take 1 tablet (25 mg total) by mouth 2 (two) times daily.  180 tablet  3  . niacin (NIASPAN) 1000 MG CR tablet Take one twice a day      . nitroGLYCERIN (NITROSTAT) 0.4 MG SL tablet Place 0.4 mg under the tongue every 5 (five) minutes as needed.        . Potassium 99 MG  TABS Take by mouth as needed.        . clopidogrel (PLAVIX) 75 MG tablet Take 4 tablets (total 336m) at the same time the first day,  then take 1 tablet daily  90 tablet  3  . rosuvastatin (CRESTOR) 5 MG tablet Take 1 tablet (5 mg total) by mouth daily.  90 tablet  0   No current facility-administered medications for this visit.   BP 144/94  Pulse 69  Ht 5' 8"  (1.727 m)  Wt 173 lb (78.472 kg)  BMI 26.31 kg/m2 General:  Well developed, well nourished, in no acute distress. Neck:  Neck supple, no JVD. No masses, thyromegaly or abnormal cervical nodes. Lungs:  Clear bilaterally to auscultation and percussion. Heart:  Non-displaced PMI, chest non-tender; regular rate and rhythm, S1, S2 without murmurs, rubs or gallops. Carotid upstroke normal, no bruit. Pedals normal pulses. No edema, no varicosities. Abdomen:  Bowel sounds positive; abdomen soft and non-tender without masses, organomegaly, or hernias noted. No hepatosplenomegaly. Extremities:  No clubbing or cyanosis. Neurologic:  Alert and oriented x 3. Psych:  Normal affect.  Assessment/Plan:  1. Chest pain: Patient has moderate nonobstructive coronary disease and is thought to perhaps have coronary vasospasm.  She gets chest pain predominantly with stress.  - Given ongoing chest pain symptoms that have been more common recently, I think it would be reasonable to get an ETT-Sestamibi.   - Continue ASA 81, will restart statin.  - Continue amlodipine for possible vasospasm. - Chest pain seems to be associated with anxiety.  She is interested in trying an SSRI to treat her anxiety.  I will have her check with her PCP about this.  2. HTN: BP high but she has not taken her am BP meds yet. 3. Hyperlipidemia: Given history of CAD (nonostructive), she should be on a statin.  I will start Crestor 5 mg daily with lipids/LFTs in 2 months.  4. TIA: Episode of numbness in arm and face about 2-3 weeks ago is concerning for TIA.  I am going to add  Plavix to her regimen to replace aspirin.  I will also get carotid dopplers.    DLoralie Champagne4/02/2013

## 2012-08-12 ENCOUNTER — Encounter (INDEPENDENT_AMBULATORY_CARE_PROVIDER_SITE_OTHER): Payer: 59

## 2012-08-12 DIAGNOSIS — G459 Transient cerebral ischemic attack, unspecified: Secondary | ICD-10-CM

## 2012-08-12 DIAGNOSIS — R079 Chest pain, unspecified: Secondary | ICD-10-CM

## 2012-08-12 DIAGNOSIS — R209 Unspecified disturbances of skin sensation: Secondary | ICD-10-CM

## 2012-08-12 DIAGNOSIS — I6529 Occlusion and stenosis of unspecified carotid artery: Secondary | ICD-10-CM

## 2012-08-15 ENCOUNTER — Telehealth: Payer: Self-pay | Admitting: *Deleted

## 2012-08-15 NOTE — Telephone Encounter (Signed)
pt notified about carotid duplex results with verbal understanding today

## 2012-08-15 NOTE — Telephone Encounter (Signed)
Message copied by Michae Kava on Fri Aug 15, 2012  1:39 PM ------      Message from: Larey Dresser      Created: Fri Aug 15, 2012 11:49 AM       Mild bilateral plaque only ------

## 2012-08-19 ENCOUNTER — Encounter (HOSPITAL_COMMUNITY): Payer: 59

## 2012-08-25 ENCOUNTER — Encounter: Payer: Self-pay | Admitting: Physician Assistant

## 2012-08-25 ENCOUNTER — Ambulatory Visit (INDEPENDENT_AMBULATORY_CARE_PROVIDER_SITE_OTHER): Payer: 59 | Admitting: Physician Assistant

## 2012-08-25 VITALS — BP 126/100 | HR 76 | Temp 97.1°F | Resp 18 | Ht 67.0 in | Wt 172.0 lb

## 2012-08-25 DIAGNOSIS — L239 Allergic contact dermatitis, unspecified cause: Secondary | ICD-10-CM

## 2012-08-25 DIAGNOSIS — L259 Unspecified contact dermatitis, unspecified cause: Secondary | ICD-10-CM

## 2012-08-25 DIAGNOSIS — F411 Generalized anxiety disorder: Secondary | ICD-10-CM

## 2012-08-25 MED ORDER — ESCITALOPRAM OXALATE 10 MG PO TABS: 10.0000 mg | ORAL_TABLET | Freq: Every day | ORAL | Status: DC

## 2012-08-25 MED ORDER — PREDNISONE 20 MG PO TABS: ORAL_TABLET | ORAL | Status: DC

## 2012-08-25 MED ORDER — ALPRAZOLAM 0.25 MG PO TABS: 0.2500 mg | ORAL_TABLET | Freq: Two times a day (BID) | ORAL | Status: DC | PRN

## 2012-08-25 NOTE — Progress Notes (Signed)
Patient ID: Janet [name] MRN: [id number], DOB: 10/22/1965, 47 y.o. Date of Encounter: @DATE @  Chief Complaint:  Chief Complaint  Patient presents with  . itching all over  rash here and there   ? just started plavi    HPI: 47 y.o. year old female  presents with:  1- Itching all over since Night of 08/22/12. Even scalp itches. Entire body itches-arms, trunk, back, legs. Has taken benadryl with minimal relief. Ate no new foods, no seafood, shrimp. No new detergent, soap, lotion etc. Started on Plavix 2-3 weeks ago secondary to possible TIA. No other new meds.Cannot think of anything she has been in contact with to cause this.    2-Generalized Anxiety: Feels she needs to start a routine medicine for this in addition to xanax prn. Sometimes feels so anxious that it causes chest pain -has discussed with her cardiologist. Also,has insomnia secondary to anxiety. Says she "worries about everything.' Says she has already changed jobs "to decrease stress"/anxiety "and only works part time" "but still anxious' . Feels on edge all the time.  Has never been on any med for anxiety except xanax. She was on Paxil in past "but that was for depression and I am not depressed now...and the paxil never really helped much anyway."    Past Medical History  Diagnosis Date  . CAD (coronary artery disease)     MI (2005) concern for coronary vasospasm  . Asthma   . HTN (hypertension)   . HLD (hyperlipidemia)   . Elevated LFTs   . Migraine headache   . Palpitations      Home Meds: Current Outpatient Prescriptions on File Prior to Visit  Medication Sig Dispense Refill  . ALPRAZolam (XANAX) 0.25 MG tablet Take 0.25 mg by mouth at bedtime as needed.        Marland Kitchen amLODipine (NORVASC) 10 MG tablet Take 1 tablet (10 mg total) by mouth daily.  90 tablet  3  . benazepril (LOTENSIN) 20 MG tablet Take 1 tablet (20 mg total) by mouth daily.  90 tablet  3  . Cholecalciferol (VITAMIN D3) 10000 UNITS capsule 2-3  capsules a week       . clopidogrel (PLAVIX) 75 MG tablet Take 4 tablets (total 34m) at the same time the first day,  then take 1 tablet daily  90 tablet  3  . Coenzyme Q10 (CO Q 10) 100 MG CAPS Take 1 capsule by mouth daily.        . metoprolol tartrate (LOPRESSOR) 25 MG tablet Take 1 tablet (25 mg total) by mouth 2 (two) times daily.  180 tablet  3  . niacin (NIASPAN) 1000 MG CR tablet Take one twice a day      . nitroGLYCERIN (NITROSTAT) 0.4 MG SL tablet Place 0.4 mg under the tongue every 5 (five) minutes as needed.        . Potassium 99 MG TABS Take by mouth as needed.        . rosuvastatin (CRESTOR) 5 MG tablet Take 1 tablet (5 mg total) by mouth daily.  90 tablet  0   No current facility-administered medications on file prior to visit.    Allergies:  Allergies  Allergen Reactions  . Codeine     REACTION: itch  . Imitrex (Sumatriptan Base) Swelling    Throat swelling  . Isosorbide     REACTION: headaches  . Rosuvastatin     REACTION: restless legs    History   Social History  .  Marital Status: Married    Spouse Name: N/A    Number of Children: N/A  . Years of Education: N/A   Occupational History  . machinist    Social History Main Topics  . Smoking status: Former Smoker -- 0.30 packs/day    Types: Cigarettes  . Smokeless tobacco: Not on file  . Alcohol Use: Yes     Comment: occasional  . Drug Use: No  . Sexually Active: Not on file   Other Topics Concern  . Not on file   Social History Narrative  . No narrative on file    Family History  Problem Relation Age of Onset  . Coronary artery disease Father   . Aneurysm Mother      Review of Systems: See hpi for pertinent ros. All others negative.  Physical Exam: Blood pressure 126/100, pulse 76, temperature 97.1 F (36.2 C), temperature source Oral, resp. rate 18, height 5' 7"  (1.702 m), weight 172 lb (78.019 kg)., Body mass index is 26.93 kg/(m^2). General: Well developed, well nourished,WF in no  acute distress. Neck: Supple. No thyromegaly. Full ROM. No lymphadenopathy. Lungs: Clear bilaterally to auscultation without wheezes, rales, or rhonchi. Breathing is unlabored. Heart: RRR with S1 S2. No murmurs, rubs, or gallops. Musculoskeletal:  Strength and tone normal for age. Extremities/Skin: mild diffuse erythema on tops of shoulders, some very minimal on legs. Scalp with no erythema or lesions. There are no skin lesions. No desquamation.  Neuro: Alert and oriented X 3. Moves all extremities spontaneously. Gait is normal. CNII-XII grossly in tact. Psych:  Responds to questions appropriately with a normal affect.     ASSESSMENT AND PLAN:  47 y.o. year old female with  1. Allergic dermatitis - predniSONE (DELTASONE) 20 MG tablet; Take 3 daily for 2 days, then 2 daily for 2 days, then 1 daily for 2 days.  Dispense: 12 tablet; Refill: 0 Cont Benadryl routinely.  If itching does not resolve or if it reoccurs after completion of prednisone, then f/u (If this happens, may need trial off plavix, or my consider scabies, but I really do not suspect scabies)  2. Generalized anxiety disorder - escitalopram (LEXAPRO) 10 MG tablet; Take 1 tablet (10 mg total) by mouth daily.  Dispense: 30 tablet; Refill: 1 - ALPRAZolam (XANAX) 0.25 MG tablet; Take 1 tablet (0.25 mg total) by mouth 2 (two) times daily as needed for sleep.  Dispense: 20 tablet; Refill: 0 Discussed expectation, proper use of meds at length.  She is to wait until itching completely resolve prior to starting Lexapro. F//U 6 weeks to re-evaluate, f/u. F/u sooner if develops any adv effects.   [address] [place], [place], [facility] 08/25/2012 3:33 PM

## 2012-09-01 ENCOUNTER — Telehealth: Payer: Self-pay | Admitting: Cardiology

## 2012-09-01 MED ORDER — ASPIRIN-DIPYRIDAMOLE ER 25-200 MG PO CP12: 1.0000 | ORAL_CAPSULE | Freq: Two times a day (BID) | ORAL | Status: DC

## 2012-09-01 NOTE — Telephone Encounter (Signed)
Spoke with patient. Pt states itching, rash no better. She will stop Plavix and start aggrenox 25/200 bid.

## 2012-09-01 NOTE — Telephone Encounter (Signed)
Spoke with patient.

## 2012-09-01 NOTE — Telephone Encounter (Signed)
Pt started Plavix 42m daily 08/07/12. Pt states she developed an all over  rash about 10 days ago. Pt states the rash comes and goes but she has horrible itching all day. She was given prednisone by her PCP but that did not help. She states nothing seems to help. I will forward to Dr MAundra Dubinfor review and recommendations

## 2012-09-01 NOTE — Telephone Encounter (Signed)
Probable Plavix allergy.  If it has not gotten better, stop Plavix and start aggrenox 25/200 mg bid.

## 2012-09-01 NOTE — Telephone Encounter (Signed)
New problem    C/O side effect from plavix . Itching, rash comes/ goes.

## 2012-09-05 ENCOUNTER — Telehealth: Payer: Self-pay | Admitting: Cardiology

## 2012-09-05 NOTE — Telephone Encounter (Signed)
Patient was taken Plavix and was changed to Aggrenox due to itching. Pt started taken  Aggrenox 25/200 mg twice a day on 09/01/12,  Pt now C/O of   having constant ringing of the ears, and a headache.

## 2012-09-05 NOTE — Telephone Encounter (Signed)
New problem   Pt's blood thinner was switched and pt is having ringing in ears and headache. Please call pt

## 2012-09-05 NOTE — Telephone Encounter (Signed)
She tolerated ASA before.  Tell her to try it for a few more days and if no improvement, change to ASA 81 mg daily.

## 2012-09-09 NOTE — Telephone Encounter (Signed)
Pt states has been take the Asprin 81 mg since 09/05/12, because she could'nt stand the headache;, and the headache has gone away now.

## 2012-10-06 ENCOUNTER — Ambulatory Visit (INDEPENDENT_AMBULATORY_CARE_PROVIDER_SITE_OTHER): Payer: 59 | Admitting: Physician Assistant

## 2012-10-07 ENCOUNTER — Other Ambulatory Visit: Payer: 59

## 2012-10-22 ENCOUNTER — Encounter: Payer: Self-pay | Admitting: Physician Assistant

## 2012-10-22 ENCOUNTER — Ambulatory Visit (INDEPENDENT_AMBULATORY_CARE_PROVIDER_SITE_OTHER): Payer: 59 | Admitting: Physician Assistant

## 2012-10-22 VITALS — BP 164/114 | HR 68 | Temp 98.6°F | Resp 18 | Wt 172.0 lb

## 2012-10-22 DIAGNOSIS — I10 Essential (primary) hypertension: Secondary | ICD-10-CM

## 2012-10-22 DIAGNOSIS — F419 Anxiety disorder, unspecified: Secondary | ICD-10-CM | POA: Insufficient documentation

## 2012-10-22 DIAGNOSIS — F411 Generalized anxiety disorder: Secondary | ICD-10-CM

## 2012-10-22 MED ORDER — ESCITALOPRAM OXALATE 10 MG PO TABS: 10.0000 mg | ORAL_TABLET | Freq: Every day | ORAL | Status: DC

## 2012-10-22 MED ORDER — ALPRAZOLAM 0.25 MG PO TABS: 0.2500 mg | ORAL_TABLET | Freq: Every evening | ORAL | Status: DC | PRN

## 2012-10-22 NOTE — Progress Notes (Signed)
Patient ID: KYLI SORTER MRN: [id number], DOB: [birth year], 47 y.o. Date of Encounter: 10/22/2012, 10:54 AM    Chief Complaint:  Chief Complaint  Patient presents with  . 6 week follow up     HPI: 47 y.o. year old white female is here for f/u of her Anxiety. At [facility] 08/25/12 she reported that she sometimes would feel so anxious that she would have chest pain. Also, was having insomnia secondary to anxiety. Said that "she worries about everything." Michela Pitcher that "she had already changed jobs to decrease stress/anxiety.Marland Kitchenand only works part-time-but still was feeling anxious. Felt "on-edge" all the time".   At that [place] 08/25/12 she said she had never been on any other meds for anxiety except xanax. Said she had been on Paxil in past but "that was for depression" and she has had no depression in long time. And she reported that "Paxil never worked anyway, even for depression."  At that [facility] I prescribed Lexapro 6m. She says this has worked well. No longer has chest pain secondary to panic/anxiety. In general, thinks her "worry" has lessened. Thinks her insomnia is improved. If she does have difficulty with insomnia for several nights in arow, then will take a Xanax the next night to make sure she gets sleep. Only uses Xanax at night and only about every 3rd to 4th night. Never uses during the day. Has had no adveres effects with Lexapro.     Home Meds: See attached medication section for any medications that were entered at today's visit. The computer does not put those onto this list.The following list is a list of meds entered prior to today's visit.   Current Outpatient Prescriptions on File Prior to Visit  Medication Sig Dispense Refill  . amLODipine (NORVASC) 10 MG tablet Take 1 tablet (10 mg total) by mouth daily.  90 tablet  3  . benazepril (LOTENSIN) 20 MG tablet Take 1 tablet (20 mg total) by mouth daily.  90 tablet  3  . butorphanol (STADOL) 10 MG/ML nasal spray Place 1 spray into the nose  2 (two) times daily as needed.      . Cholecalciferol (VITAMIN D3) 10000 UNITS capsule 2-3 capsules a week       . Coenzyme Q10 (CO Q 10) 100 MG CAPS Take 1 capsule by mouth daily.        . niacin (NIASPAN) 1000 MG CR tablet Take one twice a day      . nitroGLYCERIN (NITROSTAT) 0.4 MG SL tablet Place 0.4 mg under the tongue every 5 (five) minutes as needed.        . Potassium 99 MG TABS Take by mouth as needed.        . rosuvastatin (CRESTOR) 5 MG tablet Take 1 tablet (5 mg total) by mouth daily.  90 tablet  0  . VENTOLIN HFA 108 (90 BASE) MCG/ACT inhaler Inhale 1 puff into the lungs 4 (four) times daily as needed.      . metoprolol tartrate (LOPRESSOR) 25 MG tablet Take 1 tablet (25 mg total) by mouth 2 (two) times daily.  180 tablet  3   No current facility-administered medications on file prior to visit.    Allergies:  Allergies  Allergen Reactions  . Aggrenox (Aspirin-Dipyridamole Er) Tinitus  . Codeine     REACTION: itch  . Imitrex (Sumatriptan Base) Swelling    Throat swelling  . Isosorbide     REACTION: headaches  . Rosuvastatin     REACTION:  restless legs  . Plavix (Clopidogrel Bisulfate) Itching    itching and rash      Review of Systems: See HPI for pertinent ROS. All other ROS negative.    Physical Exam: Blood pressure 164/114, pulse 68, temperature 98.6 F (37 C), temperature source Oral, resp. rate 18, weight 172 lb (78.019 kg)., Body mass index is 26.93 kg/(m^2). General: WNWD WF. Appears in no acute distress. Neck: Supple. No thyromegaly. No lymphadenopathy. Lungs: Clear bilaterally to auscultation without wheezes, rales, or rhonchi. Breathing is unlabored. Heart: Regular rhythm. No murmurs, rubs, or gallops. Msk:  Strength and tone normal for age. Extremities/Skin: Warm and dry.. No edema. Neuro: Alert and oriented X 3. Moves all extremities spontaneously. Gait is normal. CNII-XII grossly in tact. Psych:  Responds to questions appropriately with a normal  affect.Does not appear anxious.      ASSESSMENT AND PLAN:  47 y.o. year old female with  1. Anxiety Controlled. She feels that mood is stable, well controlled with Lexapro 72m. She does not feel that she needs to increase to 265m Will cont lexapro 1022mD and cont Xanax HS prn - escitalopram (LEXAPRO) 10 MG tablet; Take 1 tablet (10 mg total) by mouth daily.  Dispense: 30 tablet; Refill: 1 - ALPRAZolam (XANAX) 0.25 MG tablet; Take 1 tablet (0.25 mg total) by mouth at bedtime as needed for anxiety.  Dispense: 30 tablet; Refill: 1  2. Essential hypertension, benign I noted that BP very high. She says that she often forgets to take meds. Discussed that this is Extremely Dangerous. She MUST come up with a system to make sure she takes ALL meds Every day.   3. Generalized anxiety disorder - escitalopram (LEXAPRO) 10 MG tablet; Take 1 tablet (10 mg total) by mouth daily.  Dispense: 30 tablet; Refill: 1 - ALPRAZolam (XANAX) 0.25 MG tablet; Take 1 tablet (0.25 mg total) by mouth at bedtime as needed for anxiety.  Dispense: 30 tablet; Refill: 1   Signed, [address]25/2014 10:54 AM

## 2012-10-25 ENCOUNTER — Other Ambulatory Visit: Payer: Self-pay | Admitting: Cardiology

## 2012-12-24 ENCOUNTER — Telehealth: Payer: Self-pay | Admitting: Physician Assistant

## 2012-12-24 NOTE — Telephone Encounter (Signed)
Pt called appt for in the morning made

## 2012-12-24 NOTE — Telephone Encounter (Signed)
NTBS.

## 2012-12-25 ENCOUNTER — Encounter: Payer: Self-pay | Admitting: Physician Assistant

## 2012-12-25 ENCOUNTER — Ambulatory Visit (INDEPENDENT_AMBULATORY_CARE_PROVIDER_SITE_OTHER): Payer: 59 | Admitting: Physician Assistant

## 2012-12-25 VITALS — BP 126/90 | HR 64 | Temp 98.4°F | Resp 18 | Wt 169.0 lb

## 2012-12-25 DIAGNOSIS — N390 Urinary tract infection, site not specified: Secondary | ICD-10-CM

## 2012-12-25 DIAGNOSIS — R3915 Urgency of urination: Secondary | ICD-10-CM

## 2012-12-25 LAB — URINALYSIS, ROUTINE W REFLEX MICROSCOPIC

## 2012-12-25 LAB — URINALYSIS, MICROSCOPIC ONLY

## 2012-12-25 MED ORDER — CIPROFLOXACIN HCL 500 MG PO TABS: 500.0000 mg | ORAL_TABLET | Freq: Two times a day (BID) | ORAL | Status: DC

## 2012-12-25 NOTE — Progress Notes (Signed)
Patient ID: Janet [name] MRN: [id number], DOB: [birth year], 47 y.o. Date of Encounter: 12/25/2012, 9:39 AM    Chief Complaint:  Chief Complaint  Patient presents with  . c/o poss UTI    c/o urgency, burning, spasms     HPI: 47 y.o. year old white female reports tha tsymptoms began 3 days ago with spasms in bladder area. Also having frequency/urgency. No fever/chills or back pain.     Home Meds: See attached medication section for any medications that were entered at today's visit. The computer does not put those onto this list.The following list is a list of meds entered prior to today's visit.   Current Outpatient Prescriptions on File Prior to Visit  Medication Sig Dispense Refill  . ALPRAZolam (XANAX) 0.25 MG tablet Take 1 tablet (0.25 mg total) by mouth at bedtime as needed for anxiety.  30 tablet  1  . amLODipine (NORVASC) 10 MG tablet TAKE 1 TABLET EVERY DAY  90 tablet  1  . benazepril (LOTENSIN) 20 MG tablet TAKE 1 TABLET EVERY DAY  90 tablet  1  . butorphanol (STADOL) 10 MG/ML nasal spray Place 1 spray into the nose 2 (two) times daily as needed.      . Cholecalciferol (VITAMIN D3) 10000 UNITS capsule 2-3 capsules a week       . Coenzyme Q10 (CO Q 10) 100 MG CAPS Take 1 capsule by mouth daily.        Marland Kitchen escitalopram (LEXAPRO) 10 MG tablet Take 1 tablet (10 mg total) by mouth daily.  30 tablet  1  . Ginger, Zingiber officinalis, (GINGER ROOT PO) Take by mouth daily.      . Ginkgo Biloba (GNP GINGKO BILOBA EXTRACT PO) Take 1 tablet by mouth daily.      . metoprolol tartrate (LOPRESSOR) 25 MG tablet TAKE 1 TABLET TWICE A DAY  180 tablet  1  . niacin (NIASPAN) 1000 MG CR tablet Take one twice a day      . nitroGLYCERIN (NITROSTAT) 0.4 MG SL tablet Place 0.4 mg under the tongue every 5 (five) minutes as needed.        . Potassium 99 MG TABS Take by mouth as needed.        . rosuvastatin (CRESTOR) 5 MG tablet Take 1 tablet (5 mg total) by mouth daily.  90 tablet  0  .  VENTOLIN HFA 108 (90 BASE) MCG/ACT inhaler Inhale 1 puff into the lungs 4 (four) times daily as needed.      . vitamin E 400 UNIT capsule Take 400 Units by mouth daily.       No current facility-administered medications on file prior to visit.    Allergies:  Allergies  Allergen Reactions  . Aggrenox [Aspirin-Dipyridamole Er] Tinitus  . Codeine     REACTION: itch  . Imitrex [Sumatriptan Base] Swelling    Throat swelling  . Isosorbide     REACTION: headaches  . Rosuvastatin     REACTION: restless legs  . Plavix [Clopidogrel Bisulfate] Itching    itching and rash      Review of Systems: See HPI for pertinent ROS. All other ROS negative.    Physical Exam: Blood pressure 126/90, pulse 64, temperature 98.4 F (36.9 C), temperature source Oral, resp. rate 18, weight 169 lb (76.658 kg)., Body mass index is 26.46 kg/(m^2). General:WF.  Appears in no acute distress. Lungs: Clear bilaterally to auscultation without wheezes, rales, or rhonchi. Breathing is unlabored. Heart: Regular rhythm. No  murmurs, rubs, or gallops. Abdomen: Soft, non-distended with normoactive bowel sounds. No hepatomegaly. No rebound/guarding. No obvious abdominal masses.Min tenderness with palpation of suprapubic area Msk:  Strength and tone normal for age. Extremities/Skin: Warm and dry. No rashes or suspicious lesions. Neuro: Alert and oriented X 3. Moves all extremities spontaneously. Gait is normal. CNII-XII grossly in tact. Psych:  Responds to questions appropriately with a normal affect.   Results for orders placed in visit on 12/25/12  URINALYSIS, ROUTINE W REFLEX MICROSCOPIC      Result Value Range   Color, Urine ORANGE (*) YELLOW   APPearance TURBID (*) CLEAR   Specific Gravity, Urine TEST NOT PERFORMED  1.005 - 1.030   pH TEST NOT PERFORMED  5.0 - 8.0   Glucose, UA TEST NOT PERFORMED  NEG mg/dL   Bilirubin Urine TEST NOT PERFORMED  NEG   Ketones, ur TEST NOT PERFORMED  NEG mg/dL   Hgb urine  dipstick TEST NOT PERFORMED  NEG   Protein, ur TEST NOT PERFORMED  NEG mg/dL   Urobilinogen, UA TEST NOT PERFORMED  0.0 - 1.0 mg/dL   Nitrite TEST NOT PERFORMED  NEG   Leukocytes, UA TEST NOT PERFORMED  NEG  URINALYSIS, MICROSCOPIC ONLY      Result Value Range   Squamous Epithelial / LPF RARE  RARE   Crystals NONE SEEN  NONE SEEN   Casts Hyaline casts noted  NONE SEEN   WBC, UA 21-50 (*) <3 WBC/hpf   RBC / HPF 3-6 (*) <3 RBC/hpf   Bacteria, UA MANY (*) RARE   Urine-Other TOO TURBIS TO SPIN       ASSESSMENT AND PLAN:  47 y.o. year old female with  1. UTI (urinary tract infection) - ciprofloxacin (CIPRO) 500 MG tablet; Take 1 tablet (500 mg total) by mouth 2 (two) times daily.  Dispense: 10 tablet; Refill: 0  2. Urgency of urination - Urinalysis, Routine w reflex microscopic  F/U if symptoms do not resolve.  [address] [place], [place], [facility] 12/25/2012 9:39 AM

## 2013-03-05 ENCOUNTER — Other Ambulatory Visit: Payer: Self-pay

## 2013-04-27 ENCOUNTER — Encounter: Payer: Self-pay | Admitting: Physician Assistant

## 2013-04-27 ENCOUNTER — Ambulatory Visit (INDEPENDENT_AMBULATORY_CARE_PROVIDER_SITE_OTHER): Payer: 59 | Admitting: Physician Assistant

## 2013-04-27 VITALS — BP 124/86 | HR 76 | Temp 98.6°F | Resp 18 | Wt 175.0 lb

## 2013-04-27 DIAGNOSIS — F419 Anxiety disorder, unspecified: Secondary | ICD-10-CM

## 2013-04-27 DIAGNOSIS — I10 Essential (primary) hypertension: Secondary | ICD-10-CM

## 2013-04-27 DIAGNOSIS — F411 Generalized anxiety disorder: Secondary | ICD-10-CM

## 2013-04-27 LAB — BASIC METABOLIC PANEL WITH GFR
Calcium: 10 mg/dL (ref 8.4–10.5)
GFR, Est African American: >89 mL/min
GFR, Est Non African American: >89 mL/min
Potassium: 4.8 mEq/L (ref 3.5–5.3)
Sodium: 137 mEq/L (ref 135–145)

## 2013-04-27 MED ORDER — ALPRAZOLAM 0.25 MG PO TABS: 0.2500 mg | ORAL_TABLET | Freq: Every evening | ORAL | Status: DC | PRN

## 2013-04-27 MED ORDER — AMLODIPINE BESYLATE 10 MG PO TABS: ORAL_TABLET | ORAL | Status: DC

## 2013-04-27 MED ORDER — BENAZEPRIL HCL 20 MG PO TABS: ORAL_TABLET | ORAL | Status: DC

## 2013-04-27 MED ORDER — METOPROLOL TARTRATE 25 MG PO TABS: ORAL_TABLET | ORAL | Status: DC

## 2013-04-27 MED ORDER — ESCITALOPRAM OXALATE 10 MG PO TABS: 10.0000 mg | ORAL_TABLET | Freq: Every day | ORAL | Status: DC

## 2013-04-27 NOTE — Progress Notes (Signed)
Patient ID: TUYEN UNCAPHER MRN: [id number], DOB: [birth year], 47 y.o. Date of Encounter: @DATE @  Chief Complaint:  Chief Complaint  Patient presents with  . 6 mth check up    not fasting    HPI: 47 y.o. year old white female  presents for routine followup office visit.  Hypertension: She states that she is taking all blood pressure medications as directed. No adverse effects. She occasionally checks it at home and it's 428 to 7:68 systolic and 11X diastolic.  Anxiety: She says that this is well-controlled with the Lexapro 10 mg. Continues to have no adverse effects with the medication. Continues to feel that her symptoms are well-controlled with this medication. The only time she uses Xanax is at night. This is only about once a week. She says that she has used it one time during the day because she was feeling panicked. Otherwise she has not felt that she is even needed it during the day.   Past Medical History  Diagnosis Date  . CAD (coronary artery disease)     MI (2005) concern for coronary vasospasm  . Asthma   . HTN (hypertension)   . HLD (hyperlipidemia)   . Elevated LFTs   . Migraine headache   . Palpitations   . Anxiety      Home Meds: See attached medication section for current medication list. Any medications entered into computer today will not appear on this note's list. The medications listed below were entered prior to today. Current Outpatient Prescriptions on File Prior to Visit  Medication Sig Dispense Refill  . butorphanol (STADOL) 10 MG/ML nasal spray Place 1 spray into the nose 2 (two) times daily as needed.      . Cholecalciferol (VITAMIN D3) 10000 UNITS capsule 2-3 capsules a week       . Coenzyme Q10 (CO Q 10) 100 MG CAPS Take 1 capsule by mouth daily.        . Ginger, Zingiber officinalis, (GINGER ROOT PO) Take by mouth daily.      . Ginkgo Biloba (GNP GINGKO BILOBA EXTRACT PO) Take 1 tablet by mouth daily.      . niacin (NIASPAN) 1000 MG CR  tablet Take one twice a day      . nitroGLYCERIN (NITROSTAT) 0.4 MG SL tablet Place 0.4 mg under the tongue every 5 (five) minutes as needed.        . Potassium 99 MG TABS Take by mouth as needed.        . rosuvastatin (CRESTOR) 5 MG tablet Take 1 tablet (5 mg total) by mouth daily.  90 tablet  0  . VENTOLIN HFA 108 (90 BASE) MCG/ACT inhaler Inhale 1 puff into the lungs 4 (four) times daily as needed.      . vitamin E 400 UNIT capsule Take 400 Units by mouth daily.       No current facility-administered medications on file prior to visit.    Allergies:  Allergies  Allergen Reactions  . Aggrenox [Aspirin-Dipyridamole Er] Tinitus  . Codeine     REACTION: itch  . Imitrex [Sumatriptan Base] Swelling    Throat swelling  . Isosorbide     REACTION: headaches  . Rosuvastatin     REACTION: restless legs  . Plavix [Clopidogrel Bisulfate] Itching    itching and rash    History   Social History  . Marital Status: Married    Spouse Name: N/A    Number of Children: N/A  . Years  of Education: N/A   Occupational History  . machinist    Social History Main Topics  . Smoking status: Former Smoker -- 0.30 packs/day    Types: Cigarettes  . Smokeless tobacco: Not on file  . Alcohol Use: Yes     Comment: occasional  . Drug Use: No  . Sexual Activity: Not on file   Other Topics Concern  . Not on file   Social History Narrative  . No narrative on file    Family History  Problem Relation Age of Onset  . Coronary artery disease Father   . Aneurysm Mother      Review of Systems:  See HPI for pertinent ROS. All other ROS negative.    Physical Exam: Blood pressure 124/86, pulse 76, temperature 98.6 F (37 C), temperature source Oral, resp. rate 18, weight 175 lb (79.379 kg)., Body mass index is 27.4 kg/(m^2). General: Overweight white female .Appears in no acute distress. Neck: Supple. No thyromegaly. No lymphadenopathy. No carotid bruit. Lungs: Clear bilaterally to auscultation  without wheezes, rales, or rhonchi. Breathing is unlabored. Heart: RRR with S1 S2. No murmurs, rubs, or gallops. Musculoskeletal:  Strength and tone normal for age. Extremities/Skin: Warm and dry. No clubbing or cyanosis. No edema. No rashes or suspicious lesions. Neuro: Alert and oriented X 3. Moves all extremities spontaneously. Gait is normal. CNII-XII grossly in tact. Psych:  Responds to questions appropriately with a normal affect.     ASSESSMENT AND PLAN:  47 y.o. year old female with  1. Essential hypertension, benign Blood pressure at goal. Continue current medications. Check labs to monitor. - BASIC METABOLIC PANEL WITH GFR - metoprolol tartrate (LOPRESSOR) 25 MG tablet; TAKE 1 TABLET TWICE A DAY  Dispense: 180 tablet; Refill: 1 - benazepril (LOTENSIN) 20 MG tablet; TAKE 1 TABLET EVERY DAY  Dispense: 90 tablet; Refill: 1 - amLODipine (NORVASC) 10 MG tablet; TAKE 1 TABLET EVERY DAY  Dispense: 90 tablet; Refill: 1  2. Anxiety Well-controlled with Lexapro 10 mg. - escitalopram (LEXAPRO) 10 MG tablet; Take 1 tablet (10 mg total) by mouth daily.  Dispense: 90 tablet; Refill: 1 - ALPRAZolam (XANAX) 0.25 MG tablet; Take 1 tablet (0.25 mg total) by mouth at bedtime as needed for anxiety.  Dispense: 30 tablet; Refill: 1  3. Generalized anxiety disorder - escitalopram (LEXAPRO) 10 MG tablet; Take 1 tablet (10 mg total) by mouth daily.  Dispense: 90 tablet; Refill: 1 - ALPRAZolam (XANAX) 0.25 MG tablet; Take 1 tablet (0.25 mg total) by mouth at bedtime as needed for anxiety.  Dispense: 30 tablet; Refill: 1  I see in Epic that she has been seeing cardiologist Dr. Algernon Huxley. He is managing her cholesterol and cardiovascular disease. As well, she says that she has a gynecologist who takes care of her GYN care including mammograms.  I discussed with her scheduling a complete physical exam with me so that we can discuss preventive care such as colonoscopies and immunizations. Otherwise, she  will be due for regular office visit with me in 6 months.  [address] [place], [place], [facility] 04/27/2013 9:54 AM

## 2013-04-29 ENCOUNTER — Encounter: Payer: Self-pay | Admitting: Family Medicine

## 2013-05-29 ENCOUNTER — Other Ambulatory Visit: Payer: Self-pay | Admitting: Obstetrics and Gynecology

## 2013-08-11 ENCOUNTER — Encounter: Payer: Self-pay | Admitting: Family Medicine

## 2013-08-11 ENCOUNTER — Ambulatory Visit (INDEPENDENT_AMBULATORY_CARE_PROVIDER_SITE_OTHER): Payer: 59 | Admitting: Family Medicine

## 2013-08-11 VITALS — BP 150/98 | HR 60 | Temp 99.0°F | Resp 14 | Ht 68.0 in | Wt 180.0 lb

## 2013-08-11 DIAGNOSIS — L500 Allergic urticaria: Secondary | ICD-10-CM

## 2013-08-11 MED ORDER — FLUTICASONE PROPIONATE 50 MCG/ACT NA SUSP: 2.0000 | Freq: Every day | NASAL | Status: DC

## 2013-08-11 MED ORDER — CETIRIZINE HCL 10 MG PO TABS: 10.0000 mg | ORAL_TABLET | Freq: Every day | ORAL | Status: DC

## 2013-08-11 NOTE — Progress Notes (Signed)
Subjective:    Patient ID: Janet [name], female    DOB: 1965-12-30, 48 y.o.   MRN: [id number]  HPI  Patient presents with an intermittent rash over the last month. She has pictures herself on. The rash is urticaria and hives. It occurs on her arms legs primarily. She cannot pinpoint any triggering factor.  She has not seen any relationship to sunlight, heat, cold, stress, chemical and physical stimuli. Medications are chronic. She has not had any changes in her medication. She denies any seasonal allergies. However the rash began when the trees started producing pollen. She denies any changes in her diet.  Her blood pressure is elevated today at 150/98. She denies any chest pain or shortness of breath. She also complains of epigastric bloating with food. She had similar symptoms previously and was successfully treated with omeprazole. She took medication for one month and then discontinue medication. Symptoms just resumed 1 month ago. Past Medical History  Diagnosis Date  . CAD (coronary artery disease)     MI (2005) concern for coronary vasospasm  . Asthma   . HTN (hypertension)   . HLD (hyperlipidemia)   . Elevated LFTs   . Migraine headache   . Palpitations   . Anxiety    Past Surgical History  Procedure Laterality Date  . Abdominoplasty    . Tubal ligation    . Bladder suspension     Current Outpatient Prescriptions on File Prior to Visit  Medication Sig Dispense Refill  . ALPRAZolam (XANAX) 0.25 MG tablet Take 1 tablet (0.25 mg total) by mouth at bedtime as needed for anxiety.  30 tablet  1  . amLODipine (NORVASC) 10 MG tablet TAKE 1 TABLET EVERY DAY  90 tablet  1  . benazepril (LOTENSIN) 20 MG tablet TAKE 1 TABLET EVERY DAY  90 tablet  1  . butorphanol (STADOL) 10 MG/ML nasal spray Place 1 spray into the nose 2 (two) times daily as needed.      . Cholecalciferol (VITAMIN D3) 10000 UNITS capsule 2-3 capsules a week       . Coenzyme Q10 (CO Q 10) 100 MG CAPS Take 1  capsule by mouth daily.        Marland Kitchen escitalopram (LEXAPRO) 10 MG tablet Take 1 tablet (10 mg total) by mouth daily.  90 tablet  1  . Ginger, Zingiber officinalis, (GINGER ROOT PO) Take by mouth daily.      . Ginkgo Biloba (GNP GINGKO BILOBA EXTRACT PO) Take 1 tablet by mouth daily.      . metoprolol tartrate (LOPRESSOR) 25 MG tablet TAKE 1 TABLET TWICE A DAY  180 tablet  1  . niacin (NIASPAN) 1000 MG CR tablet Take one twice a day      . nitroGLYCERIN (NITROSTAT) 0.4 MG SL tablet Place 0.4 mg under the tongue every 5 (five) minutes as needed.        . Potassium 99 MG TABS Take by mouth as needed.        . VENTOLIN HFA 108 (90 BASE) MCG/ACT inhaler Inhale 1 puff into the lungs 4 (four) times daily as needed.      . vitamin E 400 UNIT capsule Take 400 Units by mouth daily.       No current facility-administered medications on file prior to visit.   Allergies  Allergen Reactions  . Aggrenox [Aspirin-Dipyridamole Er] Tinitus  . Codeine     REACTION: itch  . Imitrex [Sumatriptan Base] Swelling    Throat  swelling  . Isosorbide     REACTION: headaches  . Rosuvastatin     REACTION: restless legs  . Plavix [Clopidogrel Bisulfate] Itching    itching and rash   History   Social History  . Marital Status: Married    Spouse Name: N/A    Number of Children: N/A  . Years of Education: N/A   Occupational History  . machinist    Social History Main Topics  . Smoking status: Former Smoker -- 0.30 packs/day    Types: Cigarettes  . Smokeless tobacco: Not on file  . Alcohol Use: Yes     Comment: occasional  . Drug Use: No  . Sexual Activity: Not on file   Other Topics Concern  . Not on file   Social History Narrative  . No narrative on file     Review of Systems  All other systems reviewed and are negative.      Objective:   Physical Exam  Vitals reviewed. Cardiovascular: Normal rate, regular rhythm and normal heart sounds.   Pulmonary/Chest: Effort normal and breath sounds  normal.  Abdominal: Soft. Bowel sounds are normal.  Skin: Skin is warm. No rash noted. No erythema. No pallor.          Assessment & Plan:  1. Allergic urticaria  Patient is experiencing urticaria of unknown source. All treated as though it may be seasonal allergies first. Begin Zyrtec 10 mg by mouth daily and Flonase. If symptoms are not better in one week begin to pursue other possible causes of urticaria including allergy tests.  We may also consider discontinuing some of her medications which may cause a rash such as benazepril and amlodipine. - cetirizine (ZYRTEC) 10 MG tablet; Take 1 tablet (10 mg total) by mouth daily.  Dispense: 30 tablet; Refill: 11  Past patient to resume her omeprazole. If her bloating and epigastric discomfort is not better after one to 2 weeks I would like her to call me to proceed with an abdominal US.  Past patient check her blood pressure daily for the next 2 days and referred to me the values. If it is greater than 140/90 I would add hydrochlorothiazide. - fluticasone (FLONASE) 50 MCG/ACT nasal spray; Place 2 sprays into both nostrils daily.  Dispense: 16 g; Refill: 6

## 2013-08-13 ENCOUNTER — Other Ambulatory Visit: Payer: Self-pay | Admitting: Physician Assistant

## 2013-08-13 ENCOUNTER — Encounter: Payer: Self-pay | Admitting: *Deleted

## 2013-08-13 DIAGNOSIS — Z1231 Encounter for screening mammogram for malignant neoplasm of breast: Secondary | ICD-10-CM

## 2013-10-26 ENCOUNTER — Ambulatory Visit: Payer: 59 | Admitting: Physician Assistant

## 2013-11-02 ENCOUNTER — Other Ambulatory Visit: Payer: Self-pay | Admitting: Family Medicine

## 2013-11-02 NOTE — Telephone Encounter (Signed)
Can refill one time with no  Additional refills

## 2013-11-02 NOTE — Telephone Encounter (Signed)
Ok to refill??  Last office visit 08/11/2013.  Last refill 08/25/2012.

## 2013-11-02 NOTE — Telephone Encounter (Signed)
Medication called to pharmacy. 

## 2013-11-06 ENCOUNTER — Encounter: Payer: Self-pay | Admitting: Family Medicine

## 2013-11-06 ENCOUNTER — Other Ambulatory Visit: Payer: Self-pay | Admitting: Family Medicine

## 2013-11-06 ENCOUNTER — Ambulatory Visit (INDEPENDENT_AMBULATORY_CARE_PROVIDER_SITE_OTHER): Payer: 59 | Admitting: Family Medicine

## 2013-11-06 VITALS — BP 128/78 | HR 82 | Temp 98.8°F | Resp 20 | Ht 68.0 in | Wt 176.0 lb

## 2013-11-06 DIAGNOSIS — R141 Gas pain: Secondary | ICD-10-CM

## 2013-11-06 DIAGNOSIS — R140 Abdominal distension (gaseous): Secondary | ICD-10-CM

## 2013-11-06 DIAGNOSIS — R142 Eructation: Secondary | ICD-10-CM

## 2013-11-06 DIAGNOSIS — I10 Essential (primary) hypertension: Secondary | ICD-10-CM

## 2013-11-06 DIAGNOSIS — R143 Flatulence: Secondary | ICD-10-CM

## 2013-11-06 LAB — CBC WITH DIFFERENTIAL/PLATELET
Basophils Absolute: 0.1 10*3/uL (ref 0.0–0.1)
Basophils Relative: 1 % (ref 0–1)
EOS ABS: 0.4 10*3/uL (ref 0.0–0.7)
EOS PCT: 3 % (ref 0–5)
HCT: 46.9 % — ABNORMAL HIGH (ref 36.0–46.0)
HEMOGLOBIN: 16.2 g/dL — AB (ref 12.0–15.0)
LYMPHS ABS: 4 10*3/uL (ref 0.7–4.0)
Lymphocytes Relative: 32 % (ref 12–46)
MCH: 29.6 pg (ref 26.0–34.0)
MCHC: 34.5 g/dL (ref 30.0–36.0)
MCV: 85.7 fL (ref 78.0–100.0)
MONOS PCT: 7 % (ref 3–12)
Monocytes Absolute: 0.9 10*3/uL (ref 0.1–1.0)
Neutro Abs: 7.1 10*3/uL (ref 1.7–7.7)
Neutrophils Relative %: 57 % (ref 43–77)
PLATELETS: 292 10*3/uL (ref 150–400)
RBC: 5.47 MIL/uL — AB (ref 3.87–5.11)
RDW: 14.5 % (ref 11.5–15.5)
WBC: 12.4 10*3/uL — ABNORMAL HIGH (ref 4.0–10.5)

## 2013-11-06 NOTE — Progress Notes (Signed)
Subjective:    Janet [name] ID: Janet Janet [name], female    DOB: Nov 12, 1965, 48 y.o.   MRN: [id number]  HPI Janet [name] has a history of hypertension. Her blood pressure is currently well controlled at home. Her blood pressure range is 117-138/83-92.  She denies any chest pain shortness of breath or dyspnea on exertion. She is not taking any medication for cholesterol due to Janet side effects on statins as well as niacin. She is overdue for fasting lipid panel. She continues to experience epigastric bloating. Janet bloating is constant. It is worse with food. She also reports increasing constipation and early satiety. She denies any nausea or vomiting. She denies any melena or hematochezia. She denies any abdominal pain. She denies any right upper quadrant pain.  She denies any fever or weight loss. Past Medical History  Diagnosis Date  . CAD (coronary artery disease)     MI (2005) concern for coronary vasospasm  . Asthma   . HTN (hypertension)   . HLD (hyperlipidemia)   . Elevated LFTs   . Migraine headache   . Palpitations   . Anxiety    Current Outpatient Prescriptions on File Prior to Visit  Medication Sig Dispense Refill  . ALPRAZolam (XANAX) 0.25 MG tablet Take 1 tablet (0.25 mg total) by mouth at bedtime as needed for anxiety.  30 tablet  1  . amLODipine (NORVASC) 10 MG tablet TAKE 1 TABLET EVERY DAY  90 tablet  1  . benazepril (LOTENSIN) 20 MG tablet TAKE 1 TABLET EVERY DAY  90 tablet  1  . butorphanol (STADOL) 10 MG/ML nasal spray 1 SPRAY AS NEEDED  2.5 mL  0  . cetirizine (ZYRTEC) 10 MG tablet Take 1 tablet (10 mg total) by mouth daily.  30 tablet  11  . Cholecalciferol (VITAMIN D3) 10000 UNITS capsule 2-3 capsules a week       . Coenzyme Q10 (CO Q 10) 100 MG CAPS Take 1 capsule by mouth daily.        . fluticasone (FLONASE) 50 MCG/ACT nasal spray Place 2 sprays into both nostrils daily.  16 g  6  . Ginger, Zingiber officinalis, (GINGER ROOT PO) Take by mouth daily.      . Ginkgo  Biloba (GNP GINGKO BILOBA EXTRACT PO) Take 1 tablet by mouth daily.      . metoprolol tartrate (LOPRESSOR) 25 MG tablet TAKE 1 TABLET TWICE A DAY  180 tablet  1  . nitroGLYCERIN (NITROSTAT) 0.4 MG SL tablet Place 0.4 mg under Janet tongue every 5 (five) minutes as needed.        . Potassium 99 MG TABS Take by mouth as needed.        . VENTOLIN HFA 108 (90 BASE) MCG/ACT inhaler Inhale 1 puff into Janet lungs 4 (four) times daily as needed.      . vitamin E 400 UNIT capsule Take 400 Units by mouth daily.      Marland Kitchen escitalopram (LEXAPRO) 10 MG tablet Take 1 tablet (10 mg total) by mouth daily.  90 tablet  1  . niacin (NIASPAN) 1000 MG CR tablet Take one twice a day       No current facility-administered medications on file prior to visit.   Allergies  Allergen Reactions  . Aggrenox [Aspirin-Dipyridamole Er] Tinitus  . Codeine     REACTION: itch  . Imitrex [Sumatriptan Base] Swelling    Throat swelling  . Isosorbide     REACTION: headaches  . Rosuvastatin  REACTION: restless legs  . Plavix [Clopidogrel Bisulfate] Itching    itching and rash   History   Social History  . Marital Status: Married    Spouse Name: N/A    Number of Children: N/A  . Years of Education: N/A   Occupational History  . machinist    Social History Main Topics  . Smoking status: Former Smoker -- 0.30 packs/day    Types: Cigarettes  . Smokeless tobacco: Not on file  . Alcohol Use: Yes     Comment: occasional  . Drug Use: No  . Sexual Activity: Not on file   Other Topics Concern  . Not on file   Social History Narrative  . No narrative on file      Review of Systems  All other systems reviewed and are negative.      Objective:   Physical Exam  Vitals reviewed. Cardiovascular: Normal rate, regular rhythm and normal heart sounds.   Pulmonary/Chest: Effort normal and breath sounds normal. No respiratory distress. She has no wheezes. She has no rales.  Abdominal: Soft. She exhibits no distension.  Bowel sounds are decreased. There is no tenderness. There is no rebound and no guarding.          Assessment & Plan:  1. Essential hypertension Blood pressure is well controlled. Continue current medications at their present dosages. I will check a fasting lipid panel. Goal LDL is less than 70.   - COMPLETE METABOLIC PANEL WITH GFR - CBC with Differential - Lipid panel  2. Abdominal bloating I suspect this is due to constipation. I will start Janet Janet [name] on amitiza 8 mcg pobid and then increase to 24 mcg pobid in 1 week.  If symptoms are no better in 2 weeks, I would proceed with a CT scan of abdomen and pelvis.  Janet [name] is instructed to call me if symptoms are no better.

## 2013-11-07 LAB — COMPLETE METABOLIC PANEL WITH GFR
ALT: 114 U/L — AB (ref 0–35)
AST: 63 U/L — AB (ref 0–37)
Albumin: 4.3 g/dL (ref 3.5–5.2)
Alkaline Phosphatase: 83 U/L (ref 39–117)
BUN: 15 mg/dL (ref 6–23)
CALCIUM: 9.4 mg/dL (ref 8.4–10.5)
CO2: 26 mEq/L (ref 19–32)
CREATININE: 0.63 mg/dL (ref 0.50–1.10)
Chloride: 102 mEq/L (ref 96–112)
GFR, Est African American: >89 mL/min
GFR, Est Non African American: >89 mL/min
Glucose, Bld: 90 mg/dL (ref 70–99)
POTASSIUM: 4.8 meq/L (ref 3.5–5.3)
Sodium: 139 mEq/L (ref 135–145)
Total Bilirubin: 0.5 mg/dL (ref 0.2–1.2)
Total Protein: 6.8 g/dL (ref 6.0–8.3)

## 2013-11-07 LAB — LIPID PANEL
Cholesterol: 263 mg/dL — ABNORMAL HIGH (ref 0–200)
HDL: 61 mg/dL (ref >39)
LDL Cholesterol: 171 mg/dL — ABNORMAL HIGH (ref 0–99)
Total CHOL/HDL Ratio: 4.3 Ratio
Triglycerides: 155 mg/dL — ABNORMAL HIGH (ref <150)
VLDL: 31 mg/dL (ref 0–40)

## 2013-11-10 LAB — HEPATITIS PANEL, ACUTE
HCV AB: NEGATIVE
HEP B S AG: NEGATIVE
Hep A IgM: NONREACTIVE
Hep B C IgM: NONREACTIVE

## 2013-11-11 ENCOUNTER — Other Ambulatory Visit: Payer: Self-pay | Admitting: Family Medicine

## 2013-11-11 DIAGNOSIS — R945 Abnormal results of liver function studies: Principal | ICD-10-CM

## 2013-11-11 DIAGNOSIS — R7989 Other specified abnormal findings of blood chemistry: Secondary | ICD-10-CM

## 2013-11-17 ENCOUNTER — Other Ambulatory Visit: Payer: 59

## 2013-11-17 ENCOUNTER — Other Ambulatory Visit: Payer: Self-pay | Admitting: Family Medicine

## 2013-11-17 ENCOUNTER — Ambulatory Visit
Admission: RE | Admit: 2013-11-17 | Discharge: 2013-11-17 | Disposition: A | Discharge status: Home / Self Care | Payer: 59 | Source: Ambulatory Visit | Attending: Family Medicine | Admitting: Family Medicine

## 2013-11-17 DIAGNOSIS — R7989 Other specified abnormal findings of blood chemistry: Secondary | ICD-10-CM

## 2013-11-17 DIAGNOSIS — R160 Hepatomegaly, not elsewhere classified: Secondary | ICD-10-CM

## 2013-11-17 DIAGNOSIS — R945 Abnormal results of liver function studies: Principal | ICD-10-CM

## 2013-11-18 ENCOUNTER — Other Ambulatory Visit: Payer: Self-pay | Admitting: Physician Assistant

## 2013-11-18 DIAGNOSIS — R160 Hepatomegaly, not elsewhere classified: Secondary | ICD-10-CM

## 2013-11-18 DIAGNOSIS — K769 Liver disease, unspecified: Secondary | ICD-10-CM

## 2013-11-18 LAB — AFP TUMOR MARKER: AFP-Tumor Marker: 12 ng/mL — ABNORMAL HIGH (ref 0.0–8.0)

## 2013-11-18 LAB — HEPATITIS PANEL, ACUTE
HCV Ab: NEGATIVE
HEP A IGM: NONREACTIVE
HEP B C IGM: NONREACTIVE
HEP B S AG: NEGATIVE

## 2013-11-18 LAB — HEPATITIS B SURFACE ANTIBODY,QUALITATIVE: Hep B S Ab: NEGATIVE

## 2013-11-19 ENCOUNTER — Ambulatory Visit (HOSPITAL_COMMUNITY)
Admission: RE | Admit: 2013-11-19 | Discharge: 2013-11-19 | Disposition: A | Discharge status: Home / Self Care | Payer: 59 | Source: Ambulatory Visit | Attending: Family Medicine | Admitting: Family Medicine

## 2013-11-19 DIAGNOSIS — D3500 Benign neoplasm of unspecified adrenal gland: Secondary | ICD-10-CM | POA: Insufficient documentation

## 2013-11-19 DIAGNOSIS — K769 Liver disease, unspecified: Secondary | ICD-10-CM | POA: Insufficient documentation

## 2013-11-19 DIAGNOSIS — R160 Hepatomegaly, not elsewhere classified: Secondary | ICD-10-CM

## 2013-11-19 DIAGNOSIS — K7689 Other specified diseases of liver: Secondary | ICD-10-CM | POA: Insufficient documentation

## 2013-11-19 MED ORDER — GADOXETATE DISODIUM 0.25 MMOL/ML IV SOLN
8.0000 mL | Freq: Once | INTRAVENOUS | Status: AC | PRN
  Administered 2013-11-19: 8 mL via INTRAVENOUS

## 2013-11-24 ENCOUNTER — Other Ambulatory Visit: Payer: Self-pay | Admitting: Family Medicine

## 2013-11-24 ENCOUNTER — Telehealth: Payer: Self-pay | Admitting: Family Medicine

## 2013-11-24 DIAGNOSIS — R7989 Other specified abnormal findings of blood chemistry: Secondary | ICD-10-CM

## 2013-11-24 DIAGNOSIS — R160 Hepatomegaly, not elsewhere classified: Secondary | ICD-10-CM

## 2013-11-24 DIAGNOSIS — R945 Abnormal results of liver function studies: Secondary | ICD-10-CM

## 2013-11-24 NOTE — Telephone Encounter (Signed)
Pt aware of MRI results

## 2013-11-24 NOTE — Telephone Encounter (Signed)
Patient is calling to check on the results of her mri  [phone]

## 2013-11-27 ENCOUNTER — Encounter: Payer: Self-pay | Admitting: Gastroenterology

## 2013-11-30 ENCOUNTER — Other Ambulatory Visit: Payer: Self-pay | Admitting: Physician Assistant

## 2013-11-30 NOTE — Telephone Encounter (Signed)
Refill appropriate and filled per protocol. 

## 2013-12-07 ENCOUNTER — Encounter: Payer: Self-pay | Admitting: *Deleted

## 2013-12-11 ENCOUNTER — Other Ambulatory Visit (INDEPENDENT_AMBULATORY_CARE_PROVIDER_SITE_OTHER): Payer: 59

## 2013-12-11 ENCOUNTER — Encounter: Payer: Self-pay | Admitting: Gastroenterology

## 2013-12-11 ENCOUNTER — Ambulatory Visit (INDEPENDENT_AMBULATORY_CARE_PROVIDER_SITE_OTHER): Payer: 59 | Admitting: Gastroenterology

## 2013-12-11 ENCOUNTER — Other Ambulatory Visit: Payer: Self-pay | Admitting: Gastroenterology

## 2013-12-11 VITALS — BP 120/80 | HR 80 | Ht 67.0 in | Wt 173.2 lb

## 2013-12-11 DIAGNOSIS — R7989 Other specified abnormal findings of blood chemistry: Secondary | ICD-10-CM

## 2013-12-11 DIAGNOSIS — K219 Gastro-esophageal reflux disease without esophagitis: Secondary | ICD-10-CM

## 2013-12-11 DIAGNOSIS — R945 Abnormal results of liver function studies: Principal | ICD-10-CM

## 2013-12-11 DIAGNOSIS — Z1211 Encounter for screening for malignant neoplasm of colon: Secondary | ICD-10-CM

## 2013-12-11 DIAGNOSIS — K7581 Nonalcoholic steatohepatitis (NASH): Secondary | ICD-10-CM | POA: Insufficient documentation

## 2013-12-11 DIAGNOSIS — R110 Nausea: Secondary | ICD-10-CM

## 2013-12-11 DIAGNOSIS — K7689 Other specified diseases of liver: Secondary | ICD-10-CM | POA: Insufficient documentation

## 2013-12-11 LAB — IGA: IGA: 262 mg/dL (ref 68–378)

## 2013-12-11 LAB — IRON AND TIBC
%SAT: 14 % — ABNORMAL LOW (ref 20–55)
Iron: 59 ug/dL (ref 42–145)
TIBC: 435 ug/dL (ref 250–470)
UIBC: 376 ug/dL (ref 125–400)

## 2013-12-11 LAB — HEPATITIS B CORE ANTIBODY, TOTAL: Hep B Core Total Ab: NONREACTIVE

## 2013-12-11 LAB — FERRITIN: Ferritin: 68 ng/mL (ref 10–291)

## 2013-12-11 MED ORDER — MOVIPREP 100 G PO SOLR: 1.0000 | Freq: Once | ORAL | Status: DC

## 2013-12-11 NOTE — Progress Notes (Signed)
12/11/2013 Janet [name] [id number] [birth year]   HISTORY OF PRESENT ILLNESS:  This is a 48 year old female who is previously known Dr. Carlean Purl in 2008 for elevated LFTs. I do not see any imaging or serologic evaluation from that time, however, she did undergo a liver biopsy which showed benign hepatic parenchyma. She presents to our office again today at the request of her PCP, Dr. Dennard Schaumann, for several different issues. First, she was recently found again to have elevated LFTs, higher than they had been previously. AST is 63 and ALT 114 with normal total bilirubin and alkaline phosphatase. Previously they had been no higher than AST of 41 and ALT of 59. Viral hepatitis panel negative. She underwent an ultrasound which showed an oval hypoechogenic mass in the anterior right lobe of the liver worrisome for neoplasm with MRI recommended. Also showed fatty filtration of liver with borderline hepatomegaly. 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 She also has a liver hemangioma and hepatic steatosis. AFP was elevated to 12.0.  Next, she complains of nausea and bloating in her abdomen after she eats. She complains that she has had severe reflux in the past that has required her to take medication, however, she is not current reflux medication.  She also complains of abdominal discomfort with some pain in her back, but no abdominal pain per se. Her bowels have changed and alternate between constipation and diarrhea. Prior to one year ago she remained more on the constipated side. She was placed on an Amitiza first at 8 mcg twice a day and then to 24 mcg twice a day. She says that this helps her have a bowel movement but caused her to have stool leakage and she still did not feel that she is cleaned out completely. She's never undergone colonoscopy in the  past. She feels like a bowel cleanse would help her and make her feel better.   Past Medical History  Diagnosis Date  . CAD (coronary artery disease)     MI (2005) concern for coronary vasospasm  . Asthma   . HTN (hypertension)   . HLD (hyperlipidemia)   . Elevated LFTs   . Migraine headache   . Palpitations   . Anxiety   . Liver hemangioma   . Hepatic steatosis   . Adenoma of right adrenal gland     Benign  . Depression    Past Surgical History  Procedure Laterality Date  . Abdominoplasty    . Tubal ligation    . Bladder suspension    . Cervical fusion      C4, 5, 6  . Tonsillectomy      reports that she has been smoking Cigarettes.  She has been smoking about 0.30 packs per day. She has never used smokeless tobacco. She reports that she drinks alcohol. She reports that she does not use illicit drugs. family history includes Aneurysm in her father and mother; Breast cancer in her paternal aunt; Colon polyps in her mother; Coronary artery disease in her father; Diverticulitis in her mother; Heart disease in her mother; Irritable bowel syndrome in her mother; Liver disease in her maternal uncle; Melanoma in her father; Other in her mother. Allergies  Allergen Reactions  . Aggrenox [Aspirin-Dipyridamole Er] Tinitus  . Codeine     REACTION: itch  . Imitrex [Sumatriptan Base] Swelling  Throat swelling  . Isosorbide     REACTION: headaches  . Rosuvastatin     REACTION: restless legs  . Plavix [Clopidogrel Bisulfate] Itching    itching and rash      Outpatient Encounter Prescriptions as of 12/11/2013  Medication Sig  . ALPRAZolam (XANAX) 0.25 MG tablet Take 1 tablet (0.25 mg total) by mouth at bedtime as needed for anxiety.  Marland Kitchen amLODipine (NORVASC) 10 MG tablet TAKE 1 TABLET EVERY DAY  . benazepril (LOTENSIN) 20 MG tablet TAKE 1 TABLET EVERY DAY  . butorphanol (STADOL) 10 MG/ML nasal spray 1 SPRAY AS NEEDED  . cetirizine (ZYRTEC) 10 MG tablet Take 1 tablet (10 mg total)  by mouth daily.  . fluticasone (FLONASE) 50 MCG/ACT nasal spray Place 2 sprays into both nostrils daily.  . nitroGLYCERIN (NITROSTAT) 0.4 MG SL tablet Place 0.4 mg under the tongue every 5 (five) minutes as needed.    Marland Kitchen MOVIPREP 100 G SOLR Take 1 kit (200 g total) by mouth once.  . [DISCONTINUED] Cholecalciferol (VITAMIN D3) 10000 UNITS capsule 2-3 capsules a week   . [DISCONTINUED] Coenzyme Q10 (CO Q 10) 100 MG CAPS Take 1 capsule by mouth daily.    . [DISCONTINUED] escitalopram (LEXAPRO) 10 MG tablet Take 1 tablet (10 mg total) by mouth daily.  . [DISCONTINUED] Ginger, Zingiber officinalis, (GINGER ROOT PO) Take by mouth daily.  . [DISCONTINUED] Ginkgo Biloba (GNP GINGKO BILOBA EXTRACT PO) Take 1 tablet by mouth daily.  . [DISCONTINUED] metoprolol tartrate (LOPRESSOR) 25 MG tablet TAKE 1 TABLET TWICE A DAY  . [DISCONTINUED] niacin (NIASPAN) 1000 MG CR tablet Take one twice a day  . [DISCONTINUED] Potassium 99 MG TABS Take by mouth as needed.    . [DISCONTINUED] VENTOLIN HFA 108 (90 BASE) MCG/ACT inhaler Inhale 1 puff into the lungs 4 (four) times daily as needed.  . [DISCONTINUED] vitamin E 400 UNIT capsule Take 400 Units by mouth daily.     REVIEW OF SYSTEMS  : All other systems reviewed and negative except where noted in the History of Present Illness.   PHYSICAL EXAM: BP 120/80  Pulse 80  Ht 5' 7"  (1.702 m)  Wt 173 lb 4 oz (78.586 kg)  BMI 27.13 kg/m2 General: Well developed white female in no acute distress Head: Normocephalic and atraumatic Eyes:  Sclerae anicteric, conjunctiva pink. Ears: Normal auditory acuity  Lungs: Clear throughout to auscultation Heart: Regular rate and rhythm Abdomen: Soft, non-distended.  Normal bowel sounds.  Non-tender. Rectal:  Deferred.  Will be done at the time of colonoscopy. Musculoskeletal: Symmetrical with no gross deformities  Skin: No lesions on visible extremities Extremities: No edema  Neurological: Alert oriented x 4, grossly  non-focal Psychological:  Alert and cooperative. Normal mood and affect  ASSESSMENT AND PLAN: -Screening colonoscopy:  Will schedule with Dr. Carlean Purl.  The risks, benefits, and alternatives were discussed with the patient and she consents to proceed. -Change in bowels, alternating from constipation to diarrhea:  She thinks that bowel cleanse for colonoscopy will help and does not want to try anything else for her symptoms until that is performed. -Elevated LFT's:  Likely due to fatty liver.  Liver biopsy in 2008 showed benign hepatic parenchyma.  LFT' higher now than previously.  Will check full serologic evaluation including ANA, AMA, ASMA, IgG, celiac labs, iron studies, alpha 1 anti-trypsin, ceruloplasmin.  Viral hepatitis panel is negative. 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  May need re-imaging in 6-12 months per Dr. Carlean Purl. - Nausea, bloating,  GERD:  Will schedule EGD to be added to colonoscopy procedure.  The risks, benefits, and alternatives were discussed with the patient and she consents to proceed.

## 2013-12-11 NOTE — Patient Instructions (Addendum)
You have been scheduled for an endoscopy and colonoscopy. Please follow the written instructions given to you at your visit today. Please pick up your prep at the pharmacy within the next 1-3 days. If you use inhalers (even only as needed), please bring them with you on the day of your procedure. Your physician has requested that you go to www.startemmi.com and enter the access code given to you at your visit today. This web site gives a general overview about your procedure. However, you should still follow specific instructions given to you by our office regarding your preparation for the procedure.  Please go to the basement for lab work before leaving today

## 2013-12-12 LAB — IGG: IGG (IMMUNOGLOBIN G), SERUM: 1020 mg/dL (ref 690–1700)

## 2013-12-13 LAB — T-TRANSGLUTAMINASE (TTG) IGG: Tissue Transglut Ab: <2 U/mL (ref 0–5)

## 2013-12-13 LAB — ANTI-SMOOTH MUSCLE/MITOCHOND.
Mitochondrial Ab: 58.1 Units — ABNORMAL HIGH (ref 0.0–20.0)
Smooth Muscle Ab: 8 Units (ref 0–19)

## 2013-12-14 LAB — CERULOPLASMIN: Ceruloplasmin: 35 mg/dL (ref 18–53)

## 2013-12-14 LAB — ALPHA-1-ANTITRYPSIN: A1 ANTITRYPSIN SER: 145 mg/dL (ref 83–199)

## 2013-12-14 LAB — ANA: Anti Nuclear Antibody(ANA): NEGATIVE

## 2013-12-14 LAB — MITOCHONDRIAL ANTIBODIES: MITOCHONDRIAL M2 AB, IGG: 0.34 (ref <0.91)

## 2013-12-14 NOTE — Progress Notes (Signed)
Agree with Ms. Zehr's management.  Carl E. Gessner, MD, FACG  

## 2013-12-15 NOTE — Progress Notes (Signed)
Quick Note:  Recommend liver biopsy re: abnormal transaminases and + AMA - need to evaluate for an autoimmune process in liver ______

## 2013-12-16 ENCOUNTER — Telehealth: Payer: Self-pay | Admitting: *Deleted

## 2013-12-16 DIAGNOSIS — R7989 Other specified abnormal findings of blood chemistry: Secondary | ICD-10-CM

## 2013-12-16 DIAGNOSIS — R945 Abnormal results of liver function studies: Principal | ICD-10-CM

## 2013-12-16 NOTE — Telephone Encounter (Signed)
Message copied by Hulan Saas on Wed Dec 16, 2013  3:58 PM ------      Message from: Alonza Bogus D      Created: Wed Dec 16, 2013  2:49 PM      Regarding: Lab results       Please contact patient and let her know that her labs were normal except for one called the AMA.  I discussed with Dr. Carlean Purl and we got all of her old records.  Her AMA was not abnormal in the past.  He thinks that we should repeat the liver biopsy if she is agreeable to make sure that nothing has changed with that as well.  Please schedule if she is agreeable.            Thank you,            Jess ------

## 2013-12-16 NOTE — Telephone Encounter (Signed)
Spoke with patient and gave her results and recommendations. Patient is agreeable for liver biopsy. She prefers it to be done on a Friday. Entered order into EP[facility]. Per radiology scheduling, they will call patient and schedule after this is reviewed.

## 2013-12-17 ENCOUNTER — Encounter (HOSPITAL_COMMUNITY): Payer: Self-pay | Admitting: Pharmacy Technician

## 2013-12-22 ENCOUNTER — Other Ambulatory Visit: Payer: Self-pay | Admitting: Radiology

## 2013-12-24 ENCOUNTER — Other Ambulatory Visit: Payer: Self-pay | Admitting: Radiology

## 2013-12-25 ENCOUNTER — Encounter (HOSPITAL_COMMUNITY): Payer: Self-pay

## 2013-12-25 ENCOUNTER — Ambulatory Visit (HOSPITAL_COMMUNITY)
Admission: RE | Admit: 2013-12-25 | Discharge: 2013-12-25 | Disposition: A | Discharge status: Home / Self Care | Payer: 59 | Source: Ambulatory Visit | Attending: Gastroenterology | Admitting: Gastroenterology

## 2013-12-25 DIAGNOSIS — R945 Abnormal results of liver function studies: Secondary | ICD-10-CM

## 2013-12-25 DIAGNOSIS — R7989 Other specified abnormal findings of blood chemistry: Secondary | ICD-10-CM | POA: Insufficient documentation

## 2013-12-25 LAB — PROTIME-INR
INR: 0.94 (ref 0.00–1.49)
Prothrombin Time: 12.6 seconds (ref 11.6–15.2)

## 2013-12-25 LAB — APTT: aPTT: 29 seconds (ref 24–37)

## 2013-12-25 LAB — CBC
HEMATOCRIT: 48.1 % — AB (ref 36.0–46.0)
Hemoglobin: 16.2 g/dL — ABNORMAL HIGH (ref 12.0–15.0)
MCH: 30 pg (ref 26.0–34.0)
MCHC: 33.7 g/dL (ref 30.0–36.0)
MCV: 89.1 fL (ref 78.0–100.0)
PLATELETS: 234 10*3/uL (ref 150–400)
RBC: 5.4 MIL/uL — ABNORMAL HIGH (ref 3.87–5.11)
RDW: 14.2 % (ref 11.5–15.5)
WBC: 9.1 10*3/uL (ref 4.0–10.5)

## 2013-12-25 MED ORDER — FENTANYL CITRATE 0.05 MG/ML IJ SOLN
INTRAMUSCULAR | Status: AC | PRN
  Administered 2013-12-25 (×2): 50 ug via INTRAVENOUS

## 2013-12-25 MED ORDER — GELATIN ABSORBABLE 12-7 MM EX MISC
CUTANEOUS | Status: AC
  Filled 2013-12-25: qty 1

## 2013-12-25 MED ORDER — SODIUM CHLORIDE 0.9 % IV SOLN: Freq: Once | INTRAVENOUS | Status: DC

## 2013-12-25 MED ORDER — MIDAZOLAM HCL 2 MG/2ML IJ SOLN
INTRAMUSCULAR | Status: AC | PRN
  Administered 2013-12-25 (×2): 1 mg via INTRAVENOUS

## 2013-12-25 MED ORDER — MIDAZOLAM HCL 2 MG/2ML IJ SOLN
INTRAMUSCULAR | Status: AC
  Filled 2013-12-25: qty 4

## 2013-12-25 MED ORDER — LIDOCAINE-EPINEPHRINE 1 %-1:100000 IJ SOLN
INTRAMUSCULAR | Status: AC
  Filled 2013-12-25: qty 1

## 2013-12-25 MED ORDER — FENTANYL CITRATE 0.05 MG/ML IJ SOLN
INTRAMUSCULAR | Status: AC
  Filled 2013-12-25: qty 4

## 2013-12-25 NOTE — Sedation Documentation (Signed)
Dr. Pascal Lux and Dr. Earleen Newport at bedside. Dr. Pascal Lux discussing procedure and answering questions with pt.

## 2013-12-25 NOTE — H&P (Signed)
Janet [name] is an 48 y.o. female.   Chief Complaint: Pt has had known elevated liver functions Liver core Bx 2008 revealed benign hepatic parenchyma No follow up per pt Recently has had change in bowel habits---diarrhea then constipation for months Abd bloating with eating; +N and abd pain Work up included [place]: liver lesion; MRI recommended MRI reveals fatty liver; lesion consistent with [place] Blood work shows even higher liver function tests and +AMA Scheduled now for random core liver biopsy  HPI: CAD/MI; smoker; HTN; HLD; asthma; migraines; liver hemangioma; adrenal adenoma  Past Medical History  Diagnosis Date  . CAD (coronary artery disease)     MI (2005) concern for coronary vasospasm  . Asthma   . HTN (hypertension)   . HLD (hyperlipidemia)   . Elevated LFTs   . Migraine headache   . Palpitations   . Anxiety   . Liver hemangioma   . Hepatic steatosis   . Adenoma of right adrenal gland     Benign  . Depression     Past Surgical History  Procedure Laterality Date  . Abdominoplasty    . Tubal ligation    . Bladder suspension    . Cervical fusion      C4, 5, 6  . Tonsillectomy      Family History  Problem Relation Age of Onset  . Coronary artery disease Father   . Aneurysm Mother     AAA  . Breast cancer Paternal Aunt   . Liver disease Maternal Uncle   . Colon polyps Mother   . Heart disease Mother   . Irritable bowel syndrome Mother   . Diverticulitis Mother   . Aneurysm Father     AAA  . Other Mother     Evlyn Clines  . Melanoma Father    Social History:  reports that she has been smoking Cigarettes.  She has been smoking about 0.30 packs per day. She has never used smokeless tobacco. She reports that she drinks alcohol. She reports that she does not use illicit drugs.  Allergies:  Allergies  Allergen Reactions  . Aggrenox [Aspirin-Dipyridamole Er] Tinitus  . Codeine     REACTION: itch  . Imitrex [Sumatriptan Base] Swelling    Throat swelling   . Isosorbide     REACTION: headaches  . Rosuvastatin     REACTION: restless legs  . Plavix [Clopidogrel Bisulfate] Itching    itching and rash     (Not in a hospital admission)  No results found for this or any previous visit (from the past 48 hour(s)). No results found.  Review of Systems  Constitutional: Positive for weight loss. Negative for fever.  Respiratory: Negative for shortness of breath.   Cardiovascular: Negative for chest pain.  Gastrointestinal: Positive for nausea, abdominal pain, diarrhea and constipation.  Musculoskeletal: Negative for back pain and neck pain.  Neurological: Positive for weakness. Negative for dizziness and headaches.  Psychiatric/Behavioral: Positive for substance abuse.       Smoker    Blood pressure 159/98, pulse 75, temperature 97.6 F (36.4 C), temperature source Oral, resp. rate 18, height 5' 8"  (1.727 m), weight 78.019 kg (172 lb), SpO2 100.00%. Physical Exam  Constitutional: She is oriented to person, place, and time. She appears well-nourished.  Cardiovascular: Normal rate, regular rhythm and normal heart sounds.   No murmur heard. Respiratory: Effort normal and breath sounds normal. She has no wheezes.  GI: Soft. Bowel sounds are normal. There is no tenderness.  Musculoskeletal: Normal range  of motion.  Neurological: She is alert and oriented to person, place, and time.  Skin: Skin is warm and dry.  Psychiatric: She has a normal mood and affect. Her behavior is normal. Thought content normal.     Assessment/Plan Elevated liver functions/+AMA 2008 liver bx: benign hepatic parenchyma Scheduled for another random liver core biopsy Pt aware of procedure benefits and risks and agreeable to proceed Consent signed andin chart    Kiaria Quinnell A 12/25/2013, 12:11 PM

## 2013-12-25 NOTE — Discharge Instructions (Signed)
Liver Biopsy, Care After These instructions give you information on caring for yourself after your procedure. Your doctor may also give you more specific instructions. Call your doctor if you have any problems or questions after your procedure. HOME CARE  Rest at home for 1-2 days or as told by your doctor.  Have someone stay with you for at least 24 hours.  Do not do these things in the first 24 hours:  Drive.  Use machinery.  Take care of other people.  Sign legal documents.  Take a bath or shower.  There are many different ways to close and cover a cut (incision). For example, a cut can be closed with stitches, skin glue, or adhesive strips. Follow your doctor's instructions on:  Taking care of your cut.  Changing and removing your bandage (dressing).  Removing whatever was used to close your cut.  Do not drink alcohol in the first week.  Do not lift more than 5 pounds or play contact sports for the first 2 weeks.  Take medicines only as told by your doctor. For 1 week, do not take medicine that has aspirin in it or medicines like ibuprofen.  Get your test results. GET HELP IF:  A cut bleeds and leaves more than just a small spot of blood.  A cut is red, puffs up (swells), or hurts more than before.  Fluid or something else comes from a cut.  A cut smells bad.  You have a fever or chills. GET HELP RIGHT AWAY IF:  You have swelling, bloating, or pain in your belly (abdomen).  You get dizzy or faint.  You have a rash.  You feel sick to your stomach (nauseous) or throw up (vomit).  You have trouble breathing, feel short of breath, or feel faint.  Your chest hurts.  You have problems talking or seeing.  You have trouble balancing or moving your arms or legs. Document Released: 01/24/2008 Document Revised: 08/31/2013 Document Reviewed: 06/12/2013 [facility] Patient Information 2015 [place], [place]. This information is not intended to replace advice given to  you by your health care provider. Make sure you discuss any questions you have with your health care provider.

## 2013-12-25 NOTE — Sedation Documentation (Signed)
Pt talking saying her rt arm is a little uncomfortable d/t positioning for procedure, lowered arm a bit

## 2013-12-25 NOTE — Procedures (Signed)
Technically successful US guided biopsy of right lobe of the liver.  No immediate complications.  

## 2013-12-30 ENCOUNTER — Encounter: Payer: Self-pay | Admitting: Gastroenterology

## 2013-12-31 ENCOUNTER — Encounter: Payer: Self-pay | Admitting: Internal Medicine

## 2014-01-01 NOTE — Progress Notes (Signed)
Quick Note:  OK - need to let her know and have her see me in Oct or Nov to review Avoid excessive EtOH  ______

## 2014-02-02 ENCOUNTER — Ambulatory Visit (AMBULATORY_SURGERY_CENTER): Payer: 59 | Admitting: Internal Medicine

## 2014-02-02 ENCOUNTER — Encounter: Payer: Self-pay | Admitting: Internal Medicine

## 2014-02-02 VITALS — BP 139/98 | HR 67 | Temp 97.8°F | Resp 26 | Ht 67.0 in | Wt 173.0 lb

## 2014-02-02 DIAGNOSIS — K2960 Other gastritis without bleeding: Secondary | ICD-10-CM

## 2014-02-02 DIAGNOSIS — D122 Benign neoplasm of ascending colon: Secondary | ICD-10-CM

## 2014-02-02 DIAGNOSIS — Z1211 Encounter for screening for malignant neoplasm of colon: Secondary | ICD-10-CM

## 2014-02-02 DIAGNOSIS — K319 Disease of stomach and duodenum, unspecified: Secondary | ICD-10-CM

## 2014-02-02 DIAGNOSIS — K2270 Barrett's esophagus without dysplasia: Secondary | ICD-10-CM

## 2014-02-02 DIAGNOSIS — K2100 Gastro-esophageal reflux disease with esophagitis, without bleeding: Secondary | ICD-10-CM

## 2014-02-02 DIAGNOSIS — K621 Rectal polyp: Secondary | ICD-10-CM

## 2014-02-02 DIAGNOSIS — K2900 Acute gastritis without bleeding: Secondary | ICD-10-CM

## 2014-02-02 DIAGNOSIS — D128 Benign neoplasm of rectum: Secondary | ICD-10-CM

## 2014-02-02 MED ORDER — PANTOPRAZOLE SODIUM 40 MG PO TBEC: 40.0000 mg | DELAYED_RELEASE_TABLET | Freq: Every day | ORAL | Status: DC

## 2014-02-02 MED ORDER — SODIUM CHLORIDE 0.9 % IV SOLN: 500.0000 mL | INTRAVENOUS | Status: DC

## 2014-02-02 NOTE — Progress Notes (Signed)
Called to room to assist during endoscopic procedure.  Patient ID and intended procedure confirmed with present staff. Received instructions for my participation in the procedure from the performing physician.  

## 2014-02-02 NOTE — Patient Instructions (Addendum)
There was inflammation of the esophagus and stomach. I prescribed pantoprazole to treat this. Biopsies taken and I will let you know results.  I removed 3 colon polyps that look benign. Could need a repeat routine colonoscopy within 5 years. I will let you know after pathology results in.  Please be cautious in use of alcohol as this could make your fatty liver problem worse and lead to cirrhosis. It would be in your nest interest to avoid alcohol completely if possible.  Fatty Liver Fatty liver is the accumulation of fat in liver cells. It is also called hepatosteatosis or steatohepatitis. It is normal for your liver to contain some fat. If fat is more than 5 to 10% of your liver's weight, you have fatty liver.  There are often no symptoms (problems) for years while damage is still occurring. People often learn about their fatty liver when they have medical tests for other reasons. Fat can damage your liver for years or even decades without causing problems. When it becomes severe, it can cause fatigue, weight loss, weakness, and confusion. This makes you more likely to develop more serious liver problems. The liver is the largest organ in the body. It does a lot of work and often gives no warning signs when it is sick until late in a disease. The liver has many important jobs including:  Breaking down foods.  Storing vitamins, iron, and other minerals.  Making proteins.  Making bile for food digestion.  Breaking down many products including medications, alcohol and some poisons. CAUSES  There are a number of different conditions, medications, and poisons that can cause a fatty liver. Eating too many calories causes fat to build up in the liver. Not processing and breaking fats down normally may also cause this. Certain conditions, such as obesity, diabetes, and high triglycerides also cause this. Most fatty liver patients tend to be middle-aged and over weight.  Some causes of  fatty liver are:  Alcohol over consumption.  Malnutrition.  Steroid use.  Valproic acid toxicity.  Obesity.  Cushing's syndrome.  Poisons.  Tetracycline in high dosages.  Pregnancy.  Diabetes.  Hyperlipidemia.  Rapid weight loss. Some people develop fatty liver even having none of these conditions. SYMPTOMS  Fatty liver most often causes no problems. This is called asymptomatic.  It can be diagnosed with blood tests and also by a liver biopsy.  It is one of the most common causes of minor elevations of liver enzymes on routine blood tests.  Specialized Imaging of the liver using ultrasound, CT (computed tomography) scan, or MRI (magnetic resonance imaging) can suggest a fatty liver but a biopsy is needed to confirm it.  A biopsy involves taking a small sample of liver tissue. This is done by using a needle. It is then looked at under a microscope by a specialist. TREATMENT  It is important to treat the cause. Simple fatty liver without a medical reason may not need treatment.  Weight loss, fat restriction, and exercise in overweight patients produces inconsistent results but is worth trying.  Fatty liver due to alcohol toxicity may not improve even with stopping drinking.  Good control of diabetes may reduce fatty liver.  Lower your triglycerides through diet, medication or both.  Eat a balanced, healthy diet. Avoid regular alcohol.  Increase your physical activity.  Get regular checkups from a liver specialist.  There are no medical or surgical treatments for a fatty liver or NASH, but improving your diet and increasing your  exercise may help prevent or reverse some of the damage. PROGNOSIS  Fatty liver may cause no damage or it can lead to an inflammation of the liver. This is, called steatohepatitis. When it is linked to alcohol abuse, it is called alcoholic steatohepatitis. It often is not linked to alcohol (but can be worsened with regular alcohol  consumption). It is then called nonalcoholic steatohepatitis, or NASH. Over time the liver may become scarred and hardened. This condition is called cirrhosis. Cirrhosis is serious and may lead to liver failure or cancer. NASH is one of the leading causes of cirrhosis. About 10-20% of Americans have fatty liver and a smaller 2-5% has NASH. Document Released: 06/01/2005 Document Revised: 07/09/2011 Document Reviewed: 08/26/2013 [facility] Patient Information 2015 [place], [place]. This information is not intended to replace advice given to you by your health care provider. Make sure you discuss any questions you have with your health care provider.  I appreciate the opportunity to care for you. Gatha Mayer, MD, FACG  YOU HAD AN ENDOSCOPIC PROCEDURE TODAY AT [facility]: Refer to the procedure report that was given to you for any specific questions about what was found during the examination.  If the procedure report does not answer your questions, please call your gastroenterologist to clarify.  If you requested that your care partner not be given the details of your procedure findings, then the procedure report has been included in a sealed envelope for you to review at your convenience later.  YOU SHOULD EXPECT: Some feelings of bloating in the abdomen. Passage of more gas than usual.  Walking can help get rid of the air that was put into your GI tract during the procedure and reduce the bloating. If you had a lower endoscopy (such as a colonoscopy or flexible sigmoidoscopy) you may notice spotting of blood in your stool or on the toilet paper. If you underwent a bowel prep for your procedure, then you may not have a normal bowel movement for a few days.  DIET: Your first meal following the procedure should be a light meal and then it is ok to progress to your normal diet.  A half-sandwich or bowl of soup is an example of a good first meal.  Heavy or fried foods are harder to digest and  may make you feel nauseous or bloated.  Likewise meals heavy in dairy and vegetables can cause extra gas to form and this can also increase the bloating.  Drink plenty of fluids but you should avoid alcoholic beverages for 24 hours.  ACTIVITY: Your care partner should take you home directly after the procedure.  You should plan to take it easy, moving slowly for the rest of the day.  You can resume normal activity the day after the procedure however you should NOT DRIVE or use heavy machinery for 24 hours (because of the sedation medicines used during the test).    SYMPTOMS TO REPORT IMMEDIATELY: A gastroenterologist can be reached at any hour.  During normal business hours, 8:30 AM to 5:00 PM Monday through Friday, call [phone].  After hours and on weekends, please call the GI answering service at [phone] who will take a message and have the physician on call contact you.   Following lower endoscopy (colonoscopy or flexible sigmoidoscopy):  Excessive amounts of blood in the stool  Significant tenderness or worsening of abdominal pains  Swelling of the abdomen that is new, acute  Fever of 100F or higher  Following upper endoscopy (EGD)  Vomiting of blood or coffee ground material  New chest pain or pain under the shoulder blades  Painful or persistently difficult swallowing  New shortness of breath  Fever of 100F or higher  Black, tarry-looking stools  FOLLOW UP: If any biopsies were taken you will be contacted by phone or by letter within the next 1-3 weeks.  Call your gastroenterologist if you have not heard about the biopsies in 3 weeks.  Our staff will call the home number listed on your records the next business day following your procedure to check on you and address any questions or concerns that you may have at that time regarding the information given to you following your procedure. This is a courtesy call and so if there is no answer at the home number and we have  not heard from you through the emergency physician on call, we will assume that you have returned to your regular daily activities without incident.  SIGNATURES/CONFIDENTIALITY: You and/or your care partner have signed paperwork which will be entered into your electronic medical record.  These signatures attest to the fact that that the information above on your After Visit Summary has been reviewed and is understood.  Full responsibility of the confidentiality of this discharge information lies with you and/or your care-partner.  Recommendations Next colonoscopy determined by pathology results.  Hold all aspirin, aspirin products, and NSAIDS for 2 weeks.

## 2014-02-02 NOTE — Op Note (Signed)
[place]  [address] [place], [id number]   COLONOSCOPY PROCEDURE REPORT  PATIENT: Janet, [name]  MR#: [id number] BIRTHDATE: 19-Aug-1965 , 48  yrs. old GENDER: female ENDOSCOPIST: Gatha Mayer, MD, [facility] PROCEDURE DATE:  02/02/2014 PROCEDURE:   Colonoscopy with cold biopsy polypectomy and Colonoscopy with snare polypectomy First Screening Colonoscopy - Avg.  risk and is 50 yrs.  old or older Yes.  Prior Negative Screening - Now for repeat screening. N/A  History of Adenoma - Now for follow-up colonoscopy & has been > or = to 3 yrs.  N/A ASA CLASS:   Class II INDICATIONS:first colonoscopy and average risk for colorectal cancer. MEDICATIONS: Propofol 120 mg IV and Monitored anesthesia care  DESCRIPTION OF PROCEDURE:   After the risks benefits and alternatives of the procedure were thoroughly explained, informed consent was obtained.  The digital rectal exam revealed no abnormalities of the rectum, revealed no prostatic nodules, and revealed the prostate was not enlarged.   The LB [id number] endoscope was introduced through the anus and advanced to the cecum, which was identified by both the appendix and ileocecal valve. No adverse events experienced.   The quality of the prep was excellent, using MiraLax  The instrument was then slowly withdrawn as the colon was fully examined.  COLON FINDINGS: A sessile polyp measuring 2 mm in size was found in the ascending colon.  A polypectomy was performed with cold forceps.  The resection was complete, the polyp tissue was completely retrieved and sent to histology.   Two sessile polyps ranging from 5 to 75m in size were found in the rectum.  A polypectomy was performed using snare cautery.  The resection was complete, the polyp tissue was completely retrieved and sent to histology.   The examination was otherwise normal.   Right colon retroflexion included.  Retroflexed views revealed no abnormalities.  The time to cecum=3 minutes 38 seconds.  Withdrawal time=9 minutes 18 seconds.  The scope was withdrawn and the procedure completed. COMPLICATIONS: There were no immediate complications.  ENDOSCOPIC IMPRESSION: 1.   Sessile polyp was found in the ascending colon; polypectomy was performed with cold forceps 2.   Two sessile polyps ranging from 5 to 959min size were found in the rectum; polypectomy was performed using snare cautery 3.   The examination was otherwise normal - excellent prep  RECOMMENDATIONS: 1.  Hold Aspirin and all other NSAIDS for 2 weeks. 2.  Timing of repeat colonoscopy will be determined by pathology findings.  eSigned:  CaGatha MayerMD, [facility] Institute0/09/2013 4:17 PM   cc:  ToMargaretmary EddyMD and The Patient

## 2014-02-02 NOTE — Progress Notes (Signed)
Procedure ends, to recovery, report given and VSS. 

## 2014-02-02 NOTE — Op Note (Addendum)
[place]  [address]. [place], [id number]   ENDOSCOPY PROCEDURE REPORT  PATIENT: Janet [name], Janet [name]  MR#: [id number] BIRTHDATE: 1966-01-28 , 48  yrs. old GENDER: female ENDOSCOPIST: Gatha Mayer, MD, [facility] PROCEDURE DATE:  02/02/2014 PROCEDURE:  EGD w/ biopsy ASA CLASS:     Class II INDICATIONS:  dyspepsia and history of esophageal reflux. MEDICATIONS: Propofol 230 mg IV and Monitored anesthesia care TOPICAL ANESTHETIC: none  DESCRIPTION OF PROCEDURE: After the risks benefits and alternatives of the procedure were thoroughly explained, informed consent was obtained.  The LB [id number] endoscope was introduced through the mouth and advanced to the second portion of the duodenum , Without limitations.  The instrument was slowly withdrawn as the mucosa was fully examined.    ESOPHAGUS: A small erosion was found at the gastroesophageal junction.  A biopsy was performed using cold forceps.  Sample sent for histology.  NIX_THIS NIX_THIS Description: NIX_THIS.   NIX_THIS NIX_THIS NIX_THIS NIX_THIS STOMACH: Moderate erosive gastritis was found in the gastric antrum.  Multiple biopsies were performed using cold forceps.  Sample sent for histology.  NIX_THIS Description: NIX_THIS.   NIX_THIS NIX_THIS NIX_THIS Otherwise normal EGD         The scope was then withdrawn from the patient and the procedure completed.  COMPLICATIONS: There were no immediate complications.  ENDOSCOPIC IMPRESSION: 1.   NIX_THIS Small erosion was found at the gastroesophageal junction; biopsy was performed NIX_THIS; NIX_THIS 2.   Erosive gastritis was found in the gastric antrum; multiple biopsies were performed NIX_THIS; NIX_THIS 3.   Otherwise normal EGD  RECOMMENDATIONS: 1.  PPI qam 2.  Await pathology results  REPEAT EXAM:  eSigned:  Gatha Mayer, MD, [facility] 02/02/2014 4:04 PM    CC: Margaretmary Eddy, MD and The Patient

## 2014-02-03 ENCOUNTER — Telehealth: Payer: Self-pay

## 2014-02-03 NOTE — Telephone Encounter (Signed)
Left a message for the pt to call back if any questions or concerns. maw

## 2014-02-09 ENCOUNTER — Encounter: Payer: Self-pay | Admitting: Internal Medicine

## 2014-02-09 DIAGNOSIS — Z86010 Personal history of colonic polyps: Secondary | ICD-10-CM

## 2014-02-09 DIAGNOSIS — K2270 Barrett's esophagus without dysplasia: Secondary | ICD-10-CM

## 2014-02-09 DIAGNOSIS — Z860101 Personal history of adenomatous and serrated colon polyps: Secondary | ICD-10-CM

## 2014-02-09 HISTORY — DX: Personal history of colonic polyps: Z86.010

## 2014-02-09 HISTORY — DX: Personal history of adenomatous and serrated colon polyps: Z86.0101

## 2014-02-09 HISTORY — DX: Barrett's esophagus without dysplasia: K22.70

## 2014-02-09 NOTE — Progress Notes (Signed)
Quick Note:  Single adenoma Gastritis + Barrett's esophagus  1) Stay on pantoprazole 2) see me in 2-3 months 3) recall colon 2020 ([place] [facility]) 4) recall EGD 2018 ([facility]) 5) no letter  ______

## 2014-03-12 ENCOUNTER — Ambulatory Visit: Payer: 59 | Admitting: Internal Medicine

## 2014-04-16 ENCOUNTER — Ambulatory Visit (INDEPENDENT_AMBULATORY_CARE_PROVIDER_SITE_OTHER): Payer: 59 | Admitting: Internal Medicine

## 2014-04-16 ENCOUNTER — Encounter: Payer: Self-pay | Admitting: Internal Medicine

## 2014-04-16 ENCOUNTER — Other Ambulatory Visit (INDEPENDENT_AMBULATORY_CARE_PROVIDER_SITE_OTHER): Payer: 59

## 2014-04-16 VITALS — BP 155/100 | HR 84 | Ht 68.0 in | Wt 183.8 lb

## 2014-04-16 DIAGNOSIS — K7581 Nonalcoholic steatohepatitis (NASH): Secondary | ICD-10-CM

## 2014-04-16 DIAGNOSIS — K2270 Barrett's esophagus without dysplasia: Secondary | ICD-10-CM

## 2014-04-16 LAB — HEPATIC FUNCTION PANEL
ALK PHOS: 68 U/L (ref 39–117)
ALT: 128 U/L — AB (ref 0–35)
AST: 70 U/L — ABNORMAL HIGH (ref 0–37)
Albumin: 3.8 g/dL (ref 3.5–5.2)
Bilirubin, Direct: 0.1 mg/dL (ref 0.0–0.3)
TOTAL PROTEIN: 6.6 g/dL (ref 6.0–8.3)
Total Bilirubin: 0.7 mg/dL (ref 0.2–1.2)

## 2014-04-16 NOTE — Patient Instructions (Addendum)
We will be in touch about a liver clinic referral.  Your physician has requested that you go to the basement for the following lab work before leaving today: LFT's   I appreciate the opportunity to care for you.

## 2014-04-16 NOTE — Progress Notes (Signed)
Quick Note:    Results sent via MyChart  ______

## 2014-04-16 NOTE — Assessment & Plan Note (Addendum)
Refer to hepatology for their thoughts on accuracy of dx and treatment Avoid alcohol - has been drinking 1 glass of wine 3x/week

## 2014-04-16 NOTE — Progress Notes (Signed)
   Subjective:    Patient ID: Janet [name], female    DOB: 03-14-1966, 48 y.o.   MRN: [id number]  HPI Doing better. No sig abd pain Still has intermittent nausea - may be related to anxiety Drinking 3 glasses of wine a week  Allergies  Allergen Reactions  . Aggrenox [Aspirin-Dipyridamole Er] Tinitus  . Codeine     REACTION: itch  . Imitrex [Sumatriptan Base] Swelling    Throat swelling  . Isosorbide     REACTION: headaches  . Rosuvastatin     REACTION: restless legs  . Plavix [Clopidogrel Bisulfate] Itching    itching and rash   Outpatient Prescriptions Prior to Visit  Medication Sig Dispense Refill  . ALPRAZolam (XANAX) 0.25 MG tablet Take 1 tablet (0.25 mg total) by mouth at bedtime as needed for anxiety. 30 tablet 1  . amLODipine (NORVASC) 10 MG tablet Take 10 mg by mouth daily.    . benazepril (LOTENSIN) 20 MG tablet Take 20 mg by mouth daily.    . butorphanol (STADOL) 10 MG/ML nasal spray Place 1 spray into the nose every 4 (four) hours as needed for headache.    . cetirizine (ZYRTEC) 10 MG tablet Take 10 mg by mouth daily as needed for allergies.    . fluticasone (FLONASE) 50 MCG/ACT nasal spray Place 2 sprays into both nostrils daily as needed for allergies.    . nitroGLYCERIN (NITROSTAT) 0.4 MG SL tablet Place 0.4 mg under the tongue every 5 (five) minutes as needed for chest pain.     . pantoprazole (PROTONIX) 40 MG tablet Take 1 tablet (40 mg total) by mouth daily before breakfast. 30 tablet 11   No facility-administered medications prior to visit.   Past Medical History  Diagnosis Date  . CAD (coronary artery disease)     MI (2005) concern for coronary vasospasm  . Asthma   . HTN (hypertension)   . HLD (hyperlipidemia)   . Elevated LFTs   . Migraine headache   . Palpitations   . Anxiety   . Liver hemangioma   . Hepatic steatosis   . Adenoma of right adrenal gland     Benign  . Depression   . Nonalcoholic steatohepatitis (NASH) 12/11/2013  . Allergy      SEASONAL  . Arthritis   . GERD (gastroesophageal reflux disease)   . Heart murmur     AS A CHILD  . Myocardial infarction   . Barrett's esophagus 02/09/2014  . History of adenomatous polyp of colon 02/09/2014   Past Surgical History  Procedure Laterality Date  . Abdominoplasty    . Tubal ligation    . Bladder suspension    . Cervical fusion      C4, 5, 6  . Tonsillectomy    . Colonoscopy w/ biopsies    . Esophagogastroduodenoscopy       Review of Systems As above    Objective:   Physical Exam WDWN NAD    Assessment & Plan:  Nonalcoholic steatohepatitis (NASH) - with fibrosis Refer to hepatology for their thoughts on accuracy of dx and treatment Avoid alcohol - has been drinking 1 glass of wine 3x/week  Barrett's esophagus Stay on PPI Rationale for surveillance explained as well as cancer risk and need to stay on PPI

## 2014-04-16 NOTE — Assessment & Plan Note (Signed)
Stay on PPI Rationale for surveillance explained as well as cancer risk and need to stay on PPI

## 2014-04-19 ENCOUNTER — Telehealth: Payer: Self-pay

## 2014-04-19 NOTE — Telephone Encounter (Signed)
-----   Message from Gatha Mayer, MD sent at 04/16/2014  2:28 PM EST ----- Regarding: liver referral Needs referral to [facility] in [place] re: fatty liver and fibrosis  Fax records to attn Roosevelt Locks, NP - offic notes and all labs, liver bx results, egd, colon and path reports  Fax # is [phone]

## 2014-04-19 NOTE — Telephone Encounter (Signed)
Faxed information listed below

## 2014-04-29 NOTE — Telephone Encounter (Signed)
Received notification that the liver clinic got our records and they will contact us and patient with appointment information.

## 2014-06-02 ENCOUNTER — Encounter: Payer: Self-pay | Admitting: Internal Medicine

## 2014-06-02 NOTE — Progress Notes (Signed)
Patient ID: Janet [name], female   DOB: 1966-02-12, 49 y.o.   MRN: [id number] Received fax informing us of patient's appointment with the liver clinic for 07/19/14 at 2:15pm with Roosevelt Locks, NP, fax states that patient is aware.

## 2014-07-06 ENCOUNTER — Other Ambulatory Visit: Payer: Self-pay | Admitting: Obstetrics and Gynecology

## 2014-07-07 LAB — CYTOLOGY - PAP

## 2014-07-09 ENCOUNTER — Other Ambulatory Visit: Payer: Self-pay | Admitting: Plastic Surgery

## 2014-07-19 ENCOUNTER — Other Ambulatory Visit: Payer: Self-pay | Admitting: Nurse Practitioner

## 2014-07-19 DIAGNOSIS — K769 Liver disease, unspecified: Secondary | ICD-10-CM

## 2014-07-28 ENCOUNTER — Other Ambulatory Visit: Payer: Self-pay | Admitting: Physician Assistant

## 2014-07-29 NOTE — Telephone Encounter (Signed)
ok 

## 2014-07-29 NOTE — Telephone Encounter (Signed)
Medication called to pharmacy. 

## 2014-07-29 NOTE — Telephone Encounter (Signed)
Ok to refill??  Last office visit 11/06/2013.

## 2014-08-02 ENCOUNTER — Ambulatory Visit
Admission: RE | Admit: 2014-08-02 | Discharge: 2014-08-02 | Disposition: A | Discharge status: Home / Self Care | Payer: 59 | Source: Ambulatory Visit | Attending: Nurse Practitioner | Admitting: Nurse Practitioner

## 2014-08-02 DIAGNOSIS — K769 Liver disease, unspecified: Secondary | ICD-10-CM

## 2014-08-02 MED ORDER — GADOXETATE DISODIUM 0.25 MMOL/ML IV SOLN
8.0000 mL | Freq: Once | INTRAVENOUS | Status: AC | PRN
  Administered 2014-08-02: 8 mL via INTRAVENOUS

## 2014-10-05 ENCOUNTER — Other Ambulatory Visit: Payer: Self-pay | Admitting: Family Medicine

## 2014-10-07 ENCOUNTER — Other Ambulatory Visit: Payer: Self-pay | Admitting: Family Medicine

## 2014-11-27 ENCOUNTER — Emergency Department (HOSPITAL_COMMUNITY)
Admission: EM | Admit: 2014-11-27 | Discharge: 2014-11-27 | Disposition: A | Discharge status: Nursing Home (Includes ICF) | Payer: 59 | Source: Home / Self Care | Attending: Family Medicine | Admitting: Family Medicine

## 2014-11-27 ENCOUNTER — Encounter (HOSPITAL_COMMUNITY): Payer: Self-pay

## 2014-11-27 ENCOUNTER — Encounter (HOSPITAL_COMMUNITY): Payer: Self-pay | Admitting: Emergency Medicine

## 2014-11-27 ENCOUNTER — Emergency Department (HOSPITAL_COMMUNITY): Payer: 59

## 2014-11-27 ENCOUNTER — Emergency Department (HOSPITAL_COMMUNITY)
Admission: EM | Admit: 2014-11-27 | Discharge: 2014-11-27 | Disposition: A | Discharge status: Home / Self Care | Payer: 59 | Attending: Emergency Medicine | Admitting: Emergency Medicine

## 2014-11-27 DIAGNOSIS — M542 Cervicalgia: Secondary | ICD-10-CM

## 2014-11-27 DIAGNOSIS — J45909 Unspecified asthma, uncomplicated: Secondary | ICD-10-CM | POA: Diagnosis not present

## 2014-11-27 DIAGNOSIS — I252 Old myocardial infarction: Secondary | ICD-10-CM | POA: Diagnosis not present

## 2014-11-27 DIAGNOSIS — F329 Major depressive disorder, single episode, unspecified: Secondary | ICD-10-CM | POA: Insufficient documentation

## 2014-11-27 DIAGNOSIS — R079 Chest pain, unspecified: Secondary | ICD-10-CM | POA: Insufficient documentation

## 2014-11-27 DIAGNOSIS — R011 Cardiac murmur, unspecified: Secondary | ICD-10-CM | POA: Diagnosis not present

## 2014-11-27 DIAGNOSIS — K219 Gastro-esophageal reflux disease without esophagitis: Secondary | ICD-10-CM | POA: Diagnosis not present

## 2014-11-27 DIAGNOSIS — Z720 Tobacco use: Secondary | ICD-10-CM | POA: Diagnosis not present

## 2014-11-27 DIAGNOSIS — R9431 Abnormal electrocardiogram [ECG] [EKG]: Secondary | ICD-10-CM | POA: Diagnosis not present

## 2014-11-27 DIAGNOSIS — Z85858 Personal history of malignant neoplasm of other endocrine glands: Secondary | ICD-10-CM | POA: Diagnosis not present

## 2014-11-27 DIAGNOSIS — G43909 Migraine, unspecified, not intractable, without status migrainosus: Secondary | ICD-10-CM | POA: Diagnosis not present

## 2014-11-27 DIAGNOSIS — Z7951 Long term (current) use of inhaled steroids: Secondary | ICD-10-CM | POA: Diagnosis not present

## 2014-11-27 DIAGNOSIS — Z8639 Personal history of other endocrine, nutritional and metabolic disease: Secondary | ICD-10-CM | POA: Insufficient documentation

## 2014-11-27 DIAGNOSIS — Z8505 Personal history of malignant neoplasm of liver: Secondary | ICD-10-CM | POA: Diagnosis not present

## 2014-11-27 DIAGNOSIS — I10 Essential (primary) hypertension: Secondary | ICD-10-CM | POA: Diagnosis not present

## 2014-11-27 DIAGNOSIS — Z79899 Other long term (current) drug therapy: Secondary | ICD-10-CM | POA: Insufficient documentation

## 2014-11-27 DIAGNOSIS — F419 Anxiety disorder, unspecified: Secondary | ICD-10-CM | POA: Insufficient documentation

## 2014-11-27 DIAGNOSIS — I2510 Atherosclerotic heart disease of native coronary artery without angina pectoris: Secondary | ICD-10-CM | POA: Insufficient documentation

## 2014-11-27 DIAGNOSIS — Z86010 Personal history of colonic polyps: Secondary | ICD-10-CM | POA: Insufficient documentation

## 2014-11-27 DIAGNOSIS — M1990 Unspecified osteoarthritis, unspecified site: Secondary | ICD-10-CM | POA: Insufficient documentation

## 2014-11-27 LAB — BASIC METABOLIC PANEL
ANION GAP: 9 (ref 5–15)
BUN: 11 mg/dL (ref 6–20)
CHLORIDE: 103 mmol/L (ref 101–111)
CO2: 27 mmol/L (ref 22–32)
CREATININE: 0.73 mg/dL (ref 0.44–1.00)
Calcium: 9.3 mg/dL (ref 8.9–10.3)
GFR calc non Af Amer: >60 mL/min (ref >60)
Glucose, Bld: 88 mg/dL (ref 65–99)
Potassium: 4.4 mmol/L (ref 3.5–5.1)
Sodium: 139 mmol/L (ref 135–145)

## 2014-11-27 LAB — CBC
HEMATOCRIT: 48.3 % — AB (ref 36.0–46.0)
HEMOGLOBIN: 16.2 g/dL — AB (ref 12.0–15.0)
MCH: 30.4 pg (ref 26.0–34.0)
MCHC: 33.5 g/dL (ref 30.0–36.0)
MCV: 90.6 fL (ref 78.0–100.0)
Platelets: 230 10*3/uL (ref 150–400)
RBC: 5.33 MIL/uL — ABNORMAL HIGH (ref 3.87–5.11)
RDW: 13.9 % (ref 11.5–15.5)
WBC: 10.3 10*3/uL (ref 4.0–10.5)

## 2014-11-27 LAB — I-STAT TROPONIN, ED: Troponin i, poc: 0 ng/mL (ref 0.00–0.08)

## 2014-11-27 MED ORDER — SODIUM CHLORIDE 0.9 % IV SOLN
Freq: Once | INTRAVENOUS | Status: AC
  Administered 2014-11-27: 16:00:00 via INTRAVENOUS

## 2014-11-27 NOTE — ED Notes (Signed)
Per Patient, Patient is coming from [facility] after being at home. Started to have jaw pain and left arm pain on Wednesday. Patient went to [facility] EKG was normal. Patient transferred for Chest pain cardiac work-up. Denies chest pain, nausea and vomiting, and dizziness at this point. Reports, "I feel balloon headed." Patient has cardiac hx of MI at 61.

## 2014-11-27 NOTE — ED Provider Notes (Signed)
CSN: [id number]     Arrival date & time 11/27/14  1600 History   First MD Initiated Contact with Patient 11/27/14 1604     Chief Complaint  Patient presents with  . Neck Pain     (Consider location/radiation/quality/duration/timing/severity/associated sxs/prior Treatment) HPI Comments: 49 yo female presenting with left sided neck pain for past 4 days.  Pain is sharp.  Moderate in intensity.  Constant.  Worse with bending over or turning neck.  Not associated with chest pain, shortness of breath, nausea or vomiting.  She has had some foggy headedness.  Also not associated with sore throat, difficulty swallowing, voice changes, or fevers.     Past Medical History  Diagnosis Date  . CAD (coronary artery disease)     MI (2005) concern for coronary vasospasm  . Asthma   . HTN (hypertension)   . HLD (hyperlipidemia)   . Elevated LFTs   . Migraine headache   . Palpitations   . Anxiety   . Liver hemangioma   . Hepatic steatosis   . Adenoma of right adrenal gland     Benign  . Depression   . Nonalcoholic steatohepatitis (NASH) 12/11/2013  . Allergy     SEASONAL  . Arthritis   . GERD (gastroesophageal reflux disease)   . Heart murmur     AS A CHILD  . Myocardial infarction   . Barrett's esophagus 02/09/2014  . History of adenomatous polyp of colon 02/09/2014   Past Surgical History  Procedure Laterality Date  . Abdominoplasty    . Tubal ligation    . Bladder suspension    . Cervical fusion      C4, 5, 6  . Tonsillectomy    . Colonoscopy w/ biopsies    . Esophagogastroduodenoscopy     Family History  Problem Relation Age of Onset  . Coronary artery disease Father   . Aneurysm Mother     AAA  . Breast cancer Paternal Aunt   . Liver disease Maternal Uncle   . Colon polyps Mother   . Heart disease Mother   . Irritable bowel syndrome Mother   . Diverticulitis Mother   . Aneurysm Father     AAA  . Other Mother     Evlyn Clines  . Melanoma Father    History    Substance Use Topics  . Smoking status: Current Every Day Smoker -- 0.30 packs/day    Types: Cigarettes  . Smokeless tobacco: Never Used  . Alcohol Use: Yes     Comment: occasional; 1 per day   OB History    No data available     Review of Systems  All other systems reviewed and are negative.     Allergies  Aggrenox; Codeine; Imitrex; Isosorbide; Rosuvastatin; and Plavix  Home Medications   Prior to Admission medications   Medication Sig Start Date End Date Taking? Authorizing Provider  ALPRAZolam (XANAX) 0.25 MG tablet Take 1 tablet (0.25 mg total) by mouth at bedtime as needed for anxiety. 04/27/13   Lonie Peak Dixon, PA-C  amLODipine (NORVASC) 10 MG tablet Take 10 mg by mouth daily.    Historical Provider, MD  amLODipine (NORVASC) 10 MG tablet TAKE 1 TABLET EVERY DAY 10/05/14   Susy Frizzle, MD  benazepril (LOTENSIN) 20 MG tablet Take 20 mg by mouth daily.    Historical Provider, MD  benazepril (LOTENSIN) 20 MG tablet TAKE 1 TABLET EVERY DAY 10/05/14   Susy Frizzle, MD  butorphanol (STADOL) 10 MG/ML nasal  spray USE 1 SPRAY DAILY AS NEEDED 07/29/14   Susy Frizzle, MD  cetirizine (ZYRTEC) 10 MG tablet Take 10 mg by mouth daily as needed for allergies.    Historical Provider, MD  fluticasone (FLONASE) 50 MCG/ACT nasal spray Place 2 sprays into both nostrils daily as needed for allergies.    Historical Provider, MD  fluticasone (FLONASE) 50 MCG/ACT nasal spray PLACE 2 SPRAYS INTO BOTH NOSTRILS DAILY. 10/07/14   Susy Frizzle, MD  nitroGLYCERIN (NITROSTAT) 0.4 MG SL tablet Place 0.4 mg under the tongue every 5 (five) minutes as needed for chest pain.     Historical Provider, MD  pantoprazole (PROTONIX) 40 MG tablet Take 1 tablet (40 mg total) by mouth daily before breakfast. 02/02/14   Gatha Mayer, MD   BP 147/97 mmHg  Pulse 73  Temp(Src) 98.6 F (37 C) (Oral)  Resp 14  SpO2 95% Physical Exam  Constitutional: She is oriented to person, place, and time. She appears  well-developed and well-nourished. No distress.  HENT:  Head: Normocephalic and atraumatic.  Mouth/Throat: Oropharynx is clear and moist.  Eyes: Conjunctivae are normal. Pupils are equal, round, and reactive to light. No scleral icterus.  Neck: Neck supple.    No lymphadenopathy. Normal voice. No stridor.  Cardiovascular: Normal rate, regular rhythm, normal heart sounds and intact distal pulses.   No murmur heard. Pulmonary/Chest: Effort normal and breath sounds normal. No stridor. No respiratory distress. She has no rales.  Abdominal: Soft. Bowel sounds are normal. She exhibits no distension. There is no tenderness.  Musculoskeletal: Normal range of motion.  Neurological: She is alert and oriented to person, place, and time.  Skin: Skin is warm and dry. No rash noted.  Psychiatric: She has a normal mood and affect. Her behavior is normal.  Nursing note and vitals reviewed.   ED Course  Procedures (including critical care time) Labs Review Labs Reviewed  CBC - Abnormal; Notable for the following:    RBC 5.33 (*)    Hemoglobin 16.2 (*)    HCT 48.3 (*)    All other components within normal limits  BASIC METABOLIC PANEL  I-STAT TROPOININ, ED    Imaging Review Dg Chest 2 View  11/27/2014   CLINICAL DATA:  LEFT-sided neck and jaw pain. Myocardial infarction 10 years prior.  EXAM: CHEST  2 VIEW  COMPARISON:  06/29/2010  FINDINGS: Anterior cervical fusion noted. Normal cardiac silhouette. No effusion, infiltrate, pneumothorax. Mild pleural thickening along the RIGHT minor fissure. Mild bronchitic markings  IMPRESSION: No acute cardiopulmonary process.  Mild chronic bronchitic markings.   Electronically Signed   By: Suzy Bouchard M.D.   On: 11/27/2014 17:19     EKG Interpretation None     EKG - 27-Nov-2014, 15:23 - NSR, rate 77, LAD, LAFB, LVH, nonspecific t wave abnormality, not significantly changed from prior.    MDM   Final diagnoses:  Neck pain    49 yo female sent  from urgent care due to neck pain out of concern for her cardiac history.  There is some report that her EKG was changed from prior.    Her history is unlikely to represent ACS.  Her EKG does not appear significantly changed from multiple prior.  Her troponin is negative after 4 days of constant symptoms.  Stable for dc home.  Advised follow up.    Serita Grit, MD 11/27/14 [phone]

## 2014-11-27 NOTE — Discharge Instructions (Signed)

## 2014-11-27 NOTE — ED Notes (Signed)
Pt left with all belongings and ambulated out of treatment area with family.

## 2014-11-27 NOTE — ED Notes (Signed)
C/o left side of jaw/neck pain onset Wednesday am Pain increases when she coughs Concerned b/c of CAD and MI hx  Alert, no signs of acute distress.

## 2014-11-27 NOTE — ED Provider Notes (Signed)
CSN: [id number]     Arrival date & time 11/27/14  1320 History   First MD Initiated Contact with Patient 11/27/14 1425     Chief Complaint  Patient presents with  . Neck Pain   (Consider location/radiation/quality/duration/timing/severity/associated sxs/prior Treatment) HPI Comments: Mrs. Janet [name] is a 49 yo caucasian female with a history of MI, and HTN who presents with left sided neck and jaw pain. Onset Wednesday when began having left jaw pain consistently. The jaw pain subsided next day, but the left neck pain remained. She reports pain with coughing and hears her "heart beat" in the left ear. She is concerned because of her known history of CAD. She denies chest pain, SOB, diaphoresis, or N, V. She has chronic sinus issues, but unchanged. She has HTN and has not had her medications today. She denies fever and chills.   Patient is a 49 y.o. female presenting with neck pain. The history is provided by the patient.  Neck Pain   Past Medical History  Diagnosis Date  . CAD (coronary artery disease)     MI (2005) concern for coronary vasospasm  . Asthma   . HTN (hypertension)   . HLD (hyperlipidemia)   . Elevated LFTs   . Migraine headache   . Palpitations   . Anxiety   . Liver hemangioma   . Hepatic steatosis   . Adenoma of right adrenal gland     Benign  . Depression   . Nonalcoholic steatohepatitis (NASH) 12/11/2013  . Allergy     SEASONAL  . Arthritis   . GERD (gastroesophageal reflux disease)   . Heart murmur     AS A CHILD  . Myocardial infarction   . Barrett's esophagus 02/09/2014  . History of adenomatous polyp of colon 02/09/2014   Past Surgical History  Procedure Laterality Date  . Abdominoplasty    . Tubal ligation    . Bladder suspension    . Cervical fusion      C4, 5, 6  . Tonsillectomy    . Colonoscopy w/ biopsies    . Esophagogastroduodenoscopy     Family History  Problem Relation Age of Onset  . Coronary artery disease Father   . Aneurysm Mother      AAA  . Breast cancer Paternal Aunt   . Liver disease Maternal Uncle   . Colon polyps Mother   . Heart disease Mother   . Irritable bowel syndrome Mother   . Diverticulitis Mother   . Aneurysm Father     AAA  . Other Mother     Evlyn Clines  . Melanoma Father    History  Substance Use Topics  . Smoking status: Current Every Day Smoker -- 0.30 packs/day    Types: Cigarettes  . Smokeless tobacco: Never Used  . Alcohol Use: Yes     Comment: occasional; 1 per day   OB History    No data available     Review of Systems  Musculoskeletal: Positive for neck pain.  All other systems reviewed and are negative.   Allergies  Aggrenox; Codeine; Imitrex; Isosorbide; Rosuvastatin; and Plavix  Home Medications   Prior to Admission medications   Medication Sig Start Date End Date Taking? Authorizing Provider  amLODipine (NORVASC) 10 MG tablet Take 10 mg by mouth daily.   Yes Historical Provider, MD  benazepril (LOTENSIN) 20 MG tablet Take 20 mg by mouth daily.   Yes Historical Provider, MD  ALPRAZolam (XANAX) 0.25 MG tablet Take 1 tablet (0.25 mg  total) by mouth at bedtime as needed for anxiety. 04/27/13   Lonie Peak Dixon, PA-C  amLODipine (NORVASC) 10 MG tablet TAKE 1 TABLET EVERY DAY 10/05/14   Susy Frizzle, MD  benazepril (LOTENSIN) 20 MG tablet TAKE 1 TABLET EVERY DAY 10/05/14   Susy Frizzle, MD  butorphanol (STADOL) 10 MG/ML nasal spray USE 1 SPRAY DAILY AS NEEDED 07/29/14   Susy Frizzle, MD  cetirizine (ZYRTEC) 10 MG tablet Take 10 mg by mouth daily as needed for allergies.    Historical Provider, MD  fluticasone (FLONASE) 50 MCG/ACT nasal spray Place 2 sprays into both nostrils daily as needed for allergies.    Historical Provider, MD  fluticasone (FLONASE) 50 MCG/ACT nasal spray PLACE 2 SPRAYS INTO BOTH NOSTRILS DAILY. 10/07/14   Susy Frizzle, MD  nitroGLYCERIN (NITROSTAT) 0.4 MG SL tablet Place 0.4 mg under the tongue every 5 (five) minutes as needed for chest pain.      Historical Provider, MD  pantoprazole (PROTONIX) 40 MG tablet Take 1 tablet (40 mg total) by mouth daily before breakfast. 02/02/14   Gatha Mayer, MD   BP 149/106 mmHg  Pulse 81  Temp(Src) 98.6 F (37 C) (Oral)  Resp 20  SpO2 98% Physical Exam  Constitutional: She is oriented to person, place, and time. She appears well-developed and well-nourished. No distress.  HENT:  Head: Normocephalic and atraumatic.  Right Ear: External ear normal.  Left Ear: External ear normal.  Mouth/Throat: No oropharyngeal exudate.  Neck: Normal range of motion. Neck supple.  Pain with deep palpation of the left neck into the cervical lymph node.Full ROM without pain. Trachea mid line  Cardiovascular: Normal rate and regular rhythm.   Pulmonary/Chest: Effort normal and breath sounds normal. No respiratory distress.  Neurological: She is alert and oriented to person, place, and time.  Skin: Skin is warm and dry. She is not diaphoretic.  Psychiatric: Her behavior is normal.  Nursing note and vitals reviewed.   ED Course  EKG  Date/Time: 11/27/2014 3:39 PM Performed by: Bjorn Pippin Authorized by: Ihor Gully D Comparison: compared with previous ECG from 08/07/2012 Comparison to previous ECG: T-wave changes are evident in todays' EKG Rhythm: sinus rhythm Comments: Dr. Juventino Slovak over read.    (including critical care time) Labs Review Labs Reviewed - No data to display  Imaging Review No results found.   MDM   1. EKG abnormalities   2. History of MI (myocardial infarction)   3. Neck pain    Discussed with Dr. Juventino Slovak. History non consistent with MI; though with patient's known past cardiac history and abnormal EKG (changed from previous in 2014), will send to the ER for further cardiac evaluation.    Bjorn Pippin, PA-C 11/27/14 [phone]

## 2015-01-18 ENCOUNTER — Other Ambulatory Visit: Payer: Self-pay | Admitting: Nurse Practitioner

## 2015-01-18 DIAGNOSIS — D1800 Hemangioma unspecified site: Secondary | ICD-10-CM

## 2015-03-21 ENCOUNTER — Other Ambulatory Visit: Payer: Self-pay | Admitting: Internal Medicine

## 2015-07-22 ENCOUNTER — Encounter: Payer: Self-pay | Admitting: Family Medicine

## 2015-07-27 ENCOUNTER — Inpatient Hospital Stay: Admission: RE | Admit: 2015-07-27 | Payer: 59 | Source: Ambulatory Visit

## 2015-08-09 ENCOUNTER — Other Ambulatory Visit: Payer: Self-pay | Admitting: Obstetrics and Gynecology

## 2015-08-09 ENCOUNTER — Other Ambulatory Visit: Payer: Self-pay | Admitting: Family Medicine

## 2015-08-09 NOTE — Telephone Encounter (Signed)
Refill appropriate and filled per protocol. 

## 2015-08-11 LAB — CYTOLOGY - PAP

## 2015-08-12 ENCOUNTER — Inpatient Hospital Stay: Admission: RE | Admit: 2015-08-12 | Payer: 59 | Source: Ambulatory Visit

## 2015-08-14 ENCOUNTER — Ambulatory Visit
Admission: RE | Admit: 2015-08-14 | Discharge: 2015-08-14 | Disposition: A | Discharge status: Home / Self Care | Payer: 59 | Source: Ambulatory Visit | Attending: Nurse Practitioner | Admitting: Nurse Practitioner

## 2015-08-14 DIAGNOSIS — D1800 Hemangioma unspecified site: Secondary | ICD-10-CM

## 2015-08-14 MED ORDER — GADOXETATE DISODIUM 0.25 MMOL/ML IV SOLN
8.0000 mL | Freq: Once | INTRAVENOUS | Status: AC | PRN
  Administered 2015-08-14: 8 mL via INTRAVENOUS

## 2015-08-16 ENCOUNTER — Ambulatory Visit (INDEPENDENT_AMBULATORY_CARE_PROVIDER_SITE_OTHER): Payer: 59 | Admitting: Family Medicine

## 2015-08-16 ENCOUNTER — Encounter: Payer: Self-pay | Admitting: Family Medicine

## 2015-08-16 VITALS — BP 138/72 | HR 80 | Temp 98.4°F | Resp 16 | Ht 68.0 in | Wt 175.0 lb

## 2015-08-16 DIAGNOSIS — G459 Transient cerebral ischemic attack, unspecified: Secondary | ICD-10-CM | POA: Diagnosis not present

## 2015-08-16 LAB — LIPID PANEL
Cholesterol: 287 mg/dL — ABNORMAL HIGH (ref 125–200)
HDL: 63 mg/dL (ref >=46)
LDL Cholesterol: 187 mg/dL — ABNORMAL HIGH (ref <130)
Total CHOL/HDL Ratio: 4.6 Ratio (ref <=5.0)
Triglycerides: 185 mg/dL — ABNORMAL HIGH (ref <150)
VLDL: 37 mg/dL — AB (ref <30)

## 2015-08-16 LAB — COMPLETE METABOLIC PANEL WITH GFR
ALT: 60 U/L — AB (ref 6–29)
AST: 39 U/L — ABNORMAL HIGH (ref 10–35)
Albumin: 4.2 g/dL (ref 3.6–5.1)
Alkaline Phosphatase: 62 U/L (ref 33–130)
BILIRUBIN TOTAL: 0.4 mg/dL (ref 0.2–1.2)
BUN: 11 mg/dL (ref 7–25)
CO2: 25 mmol/L (ref 20–31)
CREATININE: 0.66 mg/dL (ref 0.50–1.05)
Calcium: 9.5 mg/dL (ref 8.6–10.4)
Chloride: 102 mmol/L (ref 98–110)
GFR, Est Non African American: >89 mL/min (ref >=60)
Glucose, Bld: 86 mg/dL (ref 70–99)
Potassium: 4.6 mmol/L (ref 3.5–5.3)
Sodium: 139 mmol/L (ref 135–146)
TOTAL PROTEIN: 6.8 g/dL (ref 6.1–8.1)

## 2015-08-16 LAB — HEMOGLOBIN A1C
Hgb A1c MFr Bld: 5.8 % — ABNORMAL HIGH (ref <5.7)
Mean Plasma Glucose: 120 mg/dL

## 2015-08-16 MED ORDER — VARENICLINE TARTRATE 0.5 MG X 11 & 1 MG X 42 PO MISC
ORAL | Status: DC
Start: 2015-08-16 — End: 2016-01-23

## 2015-08-16 MED ORDER — CLOPIDOGREL BISULFATE 75 MG PO TABS: 75.0000 mg | ORAL_TABLET | Freq: Every day | ORAL | Status: DC

## 2015-08-16 NOTE — Progress Notes (Signed)
Subjective:    Patient ID: Janet [name], female    DOB: 01/05/1966, 50 y.o.   MRN: [id number]  HPI She experienced a TIA in 2014 in March. At that time carotid Dopplers revealed less than 39% blockage in the internal carotid arteries. She's been on aspirin ever since. This weekend, the patient was riding in a car with her husband when her left hand suddenly became numb. She states that the hand was not in a position where it would have "fallen asleep". Shortly after becoming numb the hand became paralyzed. This was all distal to the wrist. Symptoms lasted several minutes and then resolve spontaneously. A roughly the same time, her lips became numb. However she denies any facial paralysis. She denies any slurred speech or drooping eyelids. She denied any facial droop or lip droop. She denied any involvement in her legs. She denies any worry vision or vision changes or diplopia. Symptoms resolved with just in a few minutes. The numbness in her lips lasted several hours. She states that she is tried every statin and is unable to tolerate it. Therefore she is not on a statin. She had itching when she tried Plavix in the past. She was unable to tolerate Aggrenox. She continues to smoke. She does have a history of migraines but has not had a migraine in several years. Past Medical History  Diagnosis Date  . CAD (coronary artery disease)     MI (2005) concern for coronary vasospasm  . Asthma   . HTN (hypertension)   . HLD (hyperlipidemia)   . Elevated LFTs   . Migraine headache   . Palpitations   . Anxiety   . Liver hemangioma   . Hepatic steatosis   . Adenoma of right adrenal gland     Benign  . Depression   . Nonalcoholic steatohepatitis (NASH) 12/11/2013  . Allergy     SEASONAL  . Arthritis   . GERD (gastroesophageal reflux disease)   . Heart murmur     AS A CHILD  . Myocardial infarction ([place])   . Barrett's esophagus 02/09/2014  . History of adenomatous polyp of colon 02/09/2014    . Liver hemangioma   . Focal nodular hyperplasia of liver    Past Surgical History  Procedure Laterality Date  . Abdominoplasty    . Tubal ligation    . Bladder suspension    . Cervical fusion      C4, 5, 6  . Tonsillectomy    . Colonoscopy w/ biopsies    . Esophagogastroduodenoscopy     Current Outpatient Prescriptions on File Prior to Visit  Medication Sig Dispense Refill  . ALPRAZolam (XANAX) 0.25 MG tablet Take 1 tablet (0.25 mg total) by mouth at bedtime as needed for anxiety. (Patient taking differently: Take 0.125-0.25 mg by mouth at bedtime as needed for sleep. ) 30 tablet 1  . amLODipine (NORVASC) 10 MG tablet TAKE 1 TABLET EVERY DAY 90 tablet 0  . benazepril (LOTENSIN) 20 MG tablet TAKE 1 TABLET BY MOUTH EVERY DAY 90 tablet 0  . butorphanol (STADOL) 10 MG/ML nasal spray USE 1 SPRAY DAILY AS NEEDED (Patient taking differently: USE 1 -2 SPRAY DAILY AS NEEDED FOR MIGRAINES) 2.5 mL 0  . cetirizine (ZYRTEC) 10 MG tablet Take 10 mg by mouth daily as needed (seasonal allergies).     . fluticasone (FLONASE) 50 MCG/ACT nasal spray PLACE 2 SPRAYS INTO BOTH NOSTRILS DAILY. (Patient taking differently: PLACE 2 SPRAYS INTO BOTH NOSTRILS DAILY AS NEEDED  FOR SEASONAL ALLERGIES) 16 g 11  . ibuprofen (ADVIL,MOTRIN) 200 MG tablet Take 400 mg by mouth 2 (two) times daily as needed (pain).    . nitroGLYCERIN (NITROSTAT) 0.4 MG SL tablet Place 0.4 mg under the tongue every 5 (five) minutes as needed for chest pain.     Marland Kitchen OVER THE COUNTER MEDICATION Take 2 capsules by mouth daily. [employer]    . pantoprazole (PROTONIX) 40 MG tablet TAKE 1 TABLET (40 MG TOTAL) BY MOUTH DAILY BEFORE BREAKFAST. 30 tablet 7   No current facility-administered medications on file prior to visit.   Allergies  Allergen Reactions  . Aggrenox [Aspirin-Dipyridamole Er] Tinitus  . Codeine Itching    Nose itches  . Imitrex [Sumatriptan Base] Swelling    Throat swelling  . Isosorbide Nitrate Other (See Comments)     headaches  . Other Other (See Comments)    Artificial sweeteners cause heart palpitations (stevia is ok)  . Rosuvastatin Other (See Comments)    Restless legs  . Plavix [Clopidogrel Bisulfate] Itching and Rash   Social History   Social History  . Marital Status: Married    Spouse Name: N/A  . Number of Children: 1  . Years of Education: N/A   Occupational History  . Mechanical designer    Social History Main Topics  . Smoking status: Current Every Day Smoker -- 0.30 packs/day    Types: Cigarettes  . Smokeless tobacco: Never Used  . Alcohol Use: Yes     Comment: occasional; 1 per day  . Drug Use: No  . Sexual Activity: Not on file   Other Topics Concern  . Not on file   Social History Narrative      Review of Systems  All other systems reviewed and are negative.      Objective:   Physical Exam  Constitutional: She is oriented to person, place, and time. She appears well-developed and well-nourished. No distress.  Neck: No JVD present.  Cardiovascular: Normal rate, regular rhythm and normal heart sounds.  Exam reveals no friction rub.   No murmur heard. Pulses:      Carotid pulses are 2+ on the right side, and 2+ on the left side. Pulmonary/Chest: Effort normal and breath sounds normal. No respiratory distress. She has no wheezes. She has no rales. She exhibits no tenderness.  Abdominal: Soft. Bowel sounds are normal.  Neurological: She is alert and oriented to person, place, and time. She has normal reflexes. She displays normal reflexes. No cranial nerve deficit. She exhibits normal muscle tone. Coordination normal.  Skin: She is not diaphoretic.  Vitals reviewed.         Assessment & Plan:  Transient cerebral ischemia, unspecified transient cerebral ischemia type - Plan: clopidogrel (PLAVIX) 75 MG tablet, varenicline (CHANTIX STARTING MONTH PAK) 0.5 MG X 11 & 1 MG X 42 tablet, COMPLETE METABOLIC PANEL WITH GFR, Hemoglobin A1c, Lipid panel, MR Brain W  Wo Contrast, Carotid, Echocardiogram  Today she is completely asymptomatic. Symptoms occurred while she was taking aspirin. Therefore I have asked the patient to add Plavix 75 mg daily to aspirin. In the past the itching I am not sure was related to Plavix as it took several weeks for the itching subsided even after she stopped Plavix. I will repeat an echocardiogram of the heart to rule out cardio embolic sources of stroke. I'll also perform carotid Dopplers to evaluate for critical carotid artery stenosis. Strongly recommended smoking cessation and I gave the patient a  starter pack for Chantix. I will also obtain an MRI depending upon the results of these tests, I would also consider a neurology consultation to evaluate for other causes of transient neurologic deficits such as MS although her symptoms are atypical for this as well. I'll also check a CMP fasting lipid panel and hemoglobin A1c. I will try to get her on a statin if possible. Consider livalo which she has not taken.

## 2015-08-18 ENCOUNTER — Telehealth: Payer: Self-pay | Admitting: *Deleted

## 2015-08-18 ENCOUNTER — Other Ambulatory Visit: Payer: Self-pay | Admitting: *Deleted

## 2015-08-18 DIAGNOSIS — E785 Hyperlipidemia, unspecified: Secondary | ICD-10-CM

## 2015-08-18 MED ORDER — PITAVASTATIN CALCIUM 2 MG PO TABS: 2.0000 mg | ORAL_TABLET | Freq: Every day | ORAL | Status: DC

## 2015-08-18 NOTE — Telephone Encounter (Signed)
Received request from pharmacy for PA on Chantix.   PA submitted.   Dx: F17.200- tobacco abuse.

## 2015-08-19 ENCOUNTER — Telehealth: Payer: Self-pay | Admitting: *Deleted

## 2015-08-19 NOTE — Telephone Encounter (Signed)
Received request from pharmacy for PA on Livalo.   Patient has tried and failed all other statin therapies.   PA submitted.   Dx: E78.5- HLD, G45.9- TIA.  Received immediate confirmation.   PA approved.  Case [id number].  Pharmacy made aware.

## 2015-08-22 ENCOUNTER — Other Ambulatory Visit: Payer: Self-pay | Admitting: Family Medicine

## 2015-08-22 DIAGNOSIS — G459 Transient cerebral ischemic attack, unspecified: Secondary | ICD-10-CM

## 2015-08-22 NOTE — Telephone Encounter (Signed)
Received PA determination.   PA approved.   Case ID: [id number] .  Pharmacy made aware.

## 2015-08-24 ENCOUNTER — Ambulatory Visit
Admission: RE | Admit: 2015-08-24 | Discharge: 2015-08-24 | Disposition: A | Discharge status: Home / Self Care | Payer: 59 | Source: Ambulatory Visit | Attending: Family Medicine | Admitting: Family Medicine

## 2015-08-24 DIAGNOSIS — G459 Transient cerebral ischemic attack, unspecified: Secondary | ICD-10-CM

## 2015-08-24 MED ORDER — GADOBENATE DIMEGLUMINE 529 MG/ML IV SOLN
15.0000 mL | Freq: Once | INTRAVENOUS | Status: AC | PRN
  Administered 2015-08-24: 15 mL via INTRAVENOUS

## 2015-08-25 ENCOUNTER — Encounter: Payer: Self-pay | Admitting: Family Medicine

## 2015-08-26 ENCOUNTER — Ambulatory Visit (HOSPITAL_COMMUNITY)
Admission: RE | Admit: 2015-08-26 | Discharge: 2015-08-26 | Disposition: A | Discharge status: Home / Self Care | Payer: 59 | Source: Ambulatory Visit | Attending: Family Medicine | Admitting: Family Medicine

## 2015-08-26 DIAGNOSIS — I351 Nonrheumatic aortic (valve) insufficiency: Secondary | ICD-10-CM | POA: Insufficient documentation

## 2015-08-26 DIAGNOSIS — I6523 Occlusion and stenosis of bilateral carotid arteries: Secondary | ICD-10-CM | POA: Insufficient documentation

## 2015-08-26 DIAGNOSIS — G459 Transient cerebral ischemic attack, unspecified: Secondary | ICD-10-CM | POA: Insufficient documentation

## 2015-08-26 DIAGNOSIS — I071 Rheumatic tricuspid insufficiency: Secondary | ICD-10-CM | POA: Diagnosis not present

## 2015-08-26 DIAGNOSIS — I119 Hypertensive heart disease without heart failure: Secondary | ICD-10-CM | POA: Diagnosis not present

## 2015-08-26 DIAGNOSIS — E785 Hyperlipidemia, unspecified: Secondary | ICD-10-CM | POA: Diagnosis not present

## 2015-08-29 ENCOUNTER — Other Ambulatory Visit: Payer: Self-pay | Admitting: Family Medicine

## 2015-08-29 DIAGNOSIS — G459 Transient cerebral ischemic attack, unspecified: Secondary | ICD-10-CM

## 2015-09-02 ENCOUNTER — Telehealth: Payer: Self-pay | Admitting: Family Medicine

## 2015-09-02 NOTE — Telephone Encounter (Signed)
[employer] Auth for CPT [id number]  MRI Brain w w/o contrast.  Valid 08/22/15 - 10/06/15  [id number]

## 2015-09-11 ENCOUNTER — Other Ambulatory Visit: Payer: Self-pay | Admitting: Family Medicine

## 2015-09-29 ENCOUNTER — Other Ambulatory Visit: Payer: 59

## 2015-09-29 DIAGNOSIS — E785 Hyperlipidemia, unspecified: Secondary | ICD-10-CM

## 2015-09-29 LAB — LIPID PANEL
CHOL/HDL RATIO: 3.3 ratio (ref <=5.0)
Cholesterol: 229 mg/dL — ABNORMAL HIGH (ref 125–200)
HDL: 69 mg/dL (ref >=46)
LDL CALC: 125 mg/dL (ref <130)
TRIGLYCERIDES: 174 mg/dL — AB (ref <150)
VLDL: 35 mg/dL — AB (ref <30)

## 2015-10-04 ENCOUNTER — Encounter: Payer: Self-pay | Admitting: Neurology

## 2015-10-04 ENCOUNTER — Ambulatory Visit (INDEPENDENT_AMBULATORY_CARE_PROVIDER_SITE_OTHER): Payer: 59 | Admitting: Neurology

## 2015-10-04 VITALS — BP 132/84 | HR 73 | Ht 68.0 in | Wt 174.0 lb

## 2015-10-04 DIAGNOSIS — I10 Essential (primary) hypertension: Secondary | ICD-10-CM

## 2015-10-04 DIAGNOSIS — G458 Other transient cerebral ischemic attacks and related syndromes: Secondary | ICD-10-CM | POA: Diagnosis not present

## 2015-10-04 DIAGNOSIS — G43809 Other migraine, not intractable, without status migrainosus: Secondary | ICD-10-CM

## 2015-10-04 DIAGNOSIS — I679 Cerebrovascular disease, unspecified: Secondary | ICD-10-CM

## 2015-10-04 DIAGNOSIS — F17200 Nicotine dependence, unspecified, uncomplicated: Secondary | ICD-10-CM

## 2015-10-04 DIAGNOSIS — G43109 Migraine with aura, not intractable, without status migrainosus: Secondary | ICD-10-CM

## 2015-10-04 DIAGNOSIS — G5603 Carpal tunnel syndrome, bilateral upper limbs: Secondary | ICD-10-CM

## 2015-10-04 DIAGNOSIS — E785 Hyperlipidemia, unspecified: Secondary | ICD-10-CM

## 2015-10-04 NOTE — Patient Instructions (Signed)
I would treat as a TIA, as you do have cerebrovascular disease 1.  Continue Plavix 27m daily 2.  Continue cholesterol lowering medication, although ideally we want the LDL lower (less than 70).  Discuss with Dr. PDennard Schaumann 3.  Start exercise 4.  Continue working on to quit smoking 5.  Diet  You also appear to have carpal tunnel in both wrists. 1.  Wear wrist splints at night.  If gets worse or not improved, we can get a nerve study ordered.  Follow up in 4 months.

## 2015-10-04 NOTE — Progress Notes (Signed)
NEUROLOGY CONSULTATION NOTE  [name] MRN: [id number] DOB: 1965-08-01  Referring provider: Dr. Dennard Schaumann Primary care provider: Dr. Dennard Schaumann  Reason for consult:  TIA  HISTORY OF PRESENT ILLNESS: Janet [name] is a 50 year old right-handed woman with CAD, smoker, hypertension, hyperlipidemia, asthma, arthritis, depression, NASH and liver hemangioma and history of migraines who presents for TIA.  History obtained by patient, her husband and PCP notes.  She had a TIA in March 2014, presenting as left sided numbness of her head and face.  Carotid doppler performed on 08/12/12 showed no hemodynamically significant ICA stenosis.  She was started on ASA afterwards.    In April 2017, she was riding in the car with her husband when suddenly her left hand became numb with weakness and wrist drop.  It lasted just a few seconds but she also had left perioral numbness lasting several hours.  She denied neck pain, facial weakness, or slurred speech.  She followed up with her PCP, who started her on Plavix.  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  2D echo from 08/26/15 showed EF 60-65% with no cardiac source of emboli.  Carotid doppler showed moderate to large amount of atherosclerotic plaque in both ICAs, but without hemodynamically significant stenosis.  Hgb A1c from 08/16/15 was 5.8 and fasting lipid panel showed total cholesterol was 287, TG 185, HDL 63 and LDL 187.  She was started on Livalo 72m every other day.  Repeat fasting lipid panel from 09/29/15 showed LDL decreased to 125.    In the past, she was unable to tolerate Aggrenox and statins.  She has prior neck surgery with C3 to C5 fusions, however she denies neck pain or radicular pain down the arms.  She does report numbness in the hands when she goes to sleep, in which she needs to  shake it out.  She smokes but is currently trying to quit.  Of note, she has had several but rare episodes of visual disturbance, lasting seconds.  One involves only the right eye, in which she sees a crescent shape with triangles in her visual field.  The other involves flashing moving lights in both eyes.  There is no associated headache.  She does have history of migraine.  PAST MEDICAL HISTORY: Past Medical History  Diagnosis Date  . CAD (coronary artery disease)     MI (2005) concern for coronary vasospasm  . Asthma   . HTN (hypertension)   . HLD (hyperlipidemia)   . Elevated LFTs   . Migraine headache   . Palpitations   . Anxiety   . Liver hemangioma   . Hepatic steatosis   . Adenoma of right adrenal gland     Benign  . Depression   . Nonalcoholic steatohepatitis (NASH) 12/11/2013  . Allergy     SEASONAL  . Arthritis   . GERD (gastroesophageal reflux disease)   . Heart murmur     AS A CHILD  [place]   . Barrett's esophagus 02/09/2014  . History of adenomatous polyp of colon 02/09/2014  . Liver hemangioma   . Focal nodular hyperplasia of liver     PAST SURGICAL HISTORY: Past Surgical History  Procedure Laterality Date  . Abdominoplasty    . Tubal ligation    . Bladder suspension    . Cervical fusion  C4, 5, 6  . Tonsillectomy    . Colonoscopy w/ biopsies    . Esophagogastroduodenoscopy      MEDICATIONS: Current Outpatient Prescriptions on File Prior to Visit  Medication Sig Dispense Refill  . ALPRAZolam (XANAX) 0.25 MG tablet Take 1 tablet (0.25 mg total) by mouth at bedtime as needed for anxiety. (Patient taking differently: Take 0.125-0.25 mg by mouth at bedtime as needed for sleep. ) 30 tablet 1  . amLODipine (NORVASC) 10 MG tablet TAKE 1 TABLET BY MOUTH EVERY DAY 90 tablet 3  . benazepril (LOTENSIN) 20 MG tablet TAKE 1 TABLET BY MOUTH EVERY DAY 90 tablet 0  . butorphanol (STADOL) 10 MG/ML nasal spray USE 1 SPRAY DAILY AS NEEDED  (Patient taking differently: USE 1 -2 SPRAY DAILY AS NEEDED FOR MIGRAINES) 2.5 mL 0  . cetirizine (ZYRTEC) 10 MG tablet Take 10 mg by mouth daily as needed (seasonal allergies).     . clopidogrel (PLAVIX) 75 MG tablet Take 1 tablet (75 mg total) by mouth daily. 90 tablet 3  . fluticasone (FLONASE) 50 MCG/ACT nasal spray PLACE 2 SPRAYS INTO BOTH NOSTRILS DAILY. (Patient taking differently: PLACE 2 SPRAYS INTO BOTH NOSTRILS DAILY AS NEEDED FOR SEASONAL ALLERGIES) 16 g 11  . ibuprofen (ADVIL,MOTRIN) 200 MG tablet Take 400 mg by mouth 2 (two) times daily as needed (pain).    . nitroGLYCERIN (NITROSTAT) 0.4 MG SL tablet Place 0.4 mg under the tongue every 5 (five) minutes as needed for chest pain.     Marland Kitchen OVER THE COUNTER MEDICATION Take 2 capsules by mouth daily. [employer]    . pantoprazole (PROTONIX) 40 MG tablet TAKE 1 TABLET (40 MG TOTAL) BY MOUTH DAILY BEFORE BREAKFAST. 30 tablet 7  . Pitavastatin Calcium (LIVALO) 2 MG TABS Take 1 tablet (2 mg total) by mouth daily. 30 tablet 3  . varenicline (CHANTIX STARTING MONTH PAK) 0.5 MG X 11 & 1 MG X 42 tablet Take one 0.5 mg tablet by mouth once daily for 3 days, then increase to one 0.5 mg tablet twice daily for 4 days, then increase to one 1 mg tablet twice daily. 53 tablet 0   No current facility-administered medications on file prior to visit.    ALLERGIES: Allergies  Allergen Reactions  . Aggrenox [Aspirin-Dipyridamole Er] Tinitus  . Codeine Itching    Nose itches  . Imitrex [Sumatriptan Base] Swelling    Throat swelling  . Isosorbide Nitrate Other (See Comments)    headaches  . Other Other (See Comments)    Artificial sweeteners cause heart palpitations (stevia is ok)  . Rosuvastatin Other (See Comments)    Restless legs  . Plavix [Clopidogrel Bisulfate] Itching and Rash    FAMILY HISTORY: Family History  Problem Relation Age of Onset  . Coronary artery disease Father   . Aneurysm Father     AAA  . Melanoma Father   .  Transient ischemic attack Father   . Aneurysm Mother     AAA  . Colon polyps Mother   . Heart disease Mother   . Irritable bowel syndrome Mother   . Diverticulitis Mother   . Other Mother     Evlyn Clines  . Breast cancer Paternal Aunt   . Liver disease Maternal Uncle     SOCIAL HISTORY: Social History   Social History  . Marital Status: Married    Spouse Name: N/A  . Number of Children: 1  . Years of Education: N/A   Occupational History  .  Mechanical designer    Social History Main Topics  . Smoking status: Current Every Day Smoker -- 0.30 packs/day    Types: Cigarettes  . Smokeless tobacco: Never Used  . Alcohol Use: Yes     Comment: occasional; 1 per day  . Drug Use: No  . Sexual Activity: Not on file   Other Topics Concern  . Not on file   Social History Narrative    REVIEW OF SYSTEMS: Constitutional: No fevers, chills, or sweats, no generalized fatigue, change in appetite Eyes: No visual changes, double vision, eye pain Ear, nose and throat: No hearing loss, ear pain, nasal congestion, sore throat Cardiovascular: No chest pain, palpitations Respiratory:  No shortness of breath at rest or with exertion, wheezes GastrointestinaI: No nausea, vomiting, diarrhea, abdominal pain, fecal incontinence Genitourinary:  No dysuria, urinary retention or frequency Musculoskeletal:  No neck pain, back pain Integumentary: No rash, pruritus, skin lesions Neurological: as above Psychiatric: No depression, insomnia, anxiety Endocrine: No palpitations, fatigue, diaphoresis, mood swings, change in appetite, change in weight, increased thirst Hematologic/Lymphatic:  No purpura, petechiae. Allergic/Immunologic: no itchy/runny eyes, nasal congestion, recent allergic reactions, rashes  PHYSICAL EXAM: Filed Vitals:   10/04/15 0946  BP: 132/84  Pulse: 73   General: No acute distress.  Patient appears well-groomed.  Head:  Normocephalic/atraumatic Eyes:  fundi examined but  not visualized Neck: supple, no paraspinal tenderness, full range of motion Back: No paraspinal tenderness Heart: regular rate and rhythm Lungs: Clear to auscultation bilaterally. Vascular: No carotid bruits. Neurological Exam: Mental status: alert and oriented to person, place, and time, recent and remote memory intact, fund of knowledge intact, attention and concentration intact, speech fluent and not dysarthric, language intact. Cranial nerves: CN I: not tested CN II: pupils equal, round and reactive to light, visual fields intact CN III, IV, VI:  full range of motion, no nystagmus, no ptosis CN V: facial sensation intact CN VII: upper and lower face symmetric CN VIII: hearing intact CN IX, X: gag intact, uvula midline CN XI: sternocleidomastoid and trapezius muscles intact CN XII: tongue midline Bulk & Tone: normal, no fasciculations. Motor:  5/5 throughout  Sensation:  Pinprick sensation mildly reduced on dorsum of left hand and small area of forearm, and vibration sensation intact. . Deep Tendon Reflexes:  2+ throughout, toes downgoing.  Finger to nose testing:  Without dysmetria.  Heel to shin:  Without dysmetria.  Gait:  Normal station and stride.  Able to turn and tandem walk. Romberg negative. Tinel's and Phalen tests negative.  IMPRESSION: TIA Cerebrovascular disease Bilateral carpal tunnel syndrome Ocular migraine Tobacco use Hyperlipidemia Hypertension  PLAN: Continue Plavix for secondary stroke prevention. Recommend that Dr. Dennard Schaumann to modify statin therapy for LDL goal of less than 70 Blood pressure control Mediterranean diet Cardiovascular exercise Smoking cessation discussed Wear wrist splints at night.  If numbness or worsening symptoms in hands, will order NCV-EMG  Thank you for allowing me to take part in the care of this patient.  Metta Clines, DO  CC:  Jenna Luo, MD

## 2015-10-24 ENCOUNTER — Other Ambulatory Visit: Payer: Self-pay | Admitting: Family Medicine

## 2015-12-20 ENCOUNTER — Other Ambulatory Visit: Payer: Self-pay | Admitting: Family Medicine

## 2016-01-23 ENCOUNTER — Encounter: Payer: Self-pay | Admitting: Family Medicine

## 2016-01-23 ENCOUNTER — Ambulatory Visit
Admission: RE | Admit: 2016-01-23 | Discharge: 2016-01-23 | Disposition: A | Discharge status: Home / Self Care | Payer: 59 | Source: Ambulatory Visit | Attending: Family Medicine | Admitting: Family Medicine

## 2016-01-23 ENCOUNTER — Ambulatory Visit (INDEPENDENT_AMBULATORY_CARE_PROVIDER_SITE_OTHER): Payer: 59 | Admitting: Family Medicine

## 2016-01-23 VITALS — BP 126/76 | HR 80 | Temp 98.6°F | Resp 16 | Ht 68.0 in | Wt 172.0 lb

## 2016-01-23 DIAGNOSIS — M5441 Lumbago with sciatica, right side: Secondary | ICD-10-CM

## 2016-01-23 MED ORDER — HYDROCODONE-ACETAMINOPHEN 5-325 MG PO TABS: 1.0000 | ORAL_TABLET | Freq: Four times a day (QID) | ORAL | 0 refills | Status: AC | PRN

## 2016-01-23 NOTE — Progress Notes (Signed)
Subjective:    Patient ID: Janet [name], female    DOB: 1965/10/24, 50 y.o.   MRN: [id number]  HPI Patient has had low back pain for quite some time. In fact she states it's been several years. However recently she took up therapeutic horseback riding. Ever since that time, she has had intense pain starting in her lower back radiating into both hips in particular down the lateral aspect of the right leg. She reports numbness and tingling on the lateral side of her right foot after prolonged periods of riding. The pain can become intense. She is unable to take NSAIDs due to the fact she is on Plavix. Tylenol has been ineffective. She denies any symptoms of cauda equina syndrome or neurogenic claudication Past Medical History:  Diagnosis Date  . Adenoma of right adrenal gland    Benign  . Allergy    SEASONAL  . Anxiety   . Arthritis   . Asthma   . Barrett's esophagus 02/09/2014  . CAD (coronary artery disease)    MI (2005) concern for coronary vasospasm  . Depression   . Elevated LFTs   . Focal nodular hyperplasia of liver   . GERD (gastroesophageal reflux disease)   . Heart murmur    AS A CHILD  . Hepatic steatosis   . History of adenomatous polyp of colon 02/09/2014  . HLD (hyperlipidemia)   . HTN (hypertension)   . Liver hemangioma   . Liver hemangioma   . Migraine headache   . Myocardial infarction ([place])   . Nonalcoholic steatohepatitis (NASH) 12/11/2013  . Palpitations    Past Surgical History:  Procedure Laterality Date  . ABDOMINOPLASTY    . BLADDER SUSPENSION    . CERVICAL FUSION     C4, 5, 6  . COLONOSCOPY W/ BIOPSIES    . ESOPHAGOGASTRODUODENOSCOPY    . TONSILLECTOMY    . TUBAL LIGATION     Current Outpatient Prescriptions on File Prior to Visit  Medication Sig Dispense Refill  . amLODipine (NORVASC) 10 MG tablet TAKE 1 TABLET BY MOUTH EVERY DAY 90 tablet 3  . benazepril (LOTENSIN) 20 MG tablet TAKE 1 TABLET BY MOUTH EVERY DAY 90 tablet 0  .  butorphanol (STADOL) 10 MG/ML nasal spray USE 1 SPRAY DAILY AS NEEDED (Patient taking differently: USE 1 -2 SPRAY DAILY AS NEEDED FOR MIGRAINES) 2.5 mL 0  . cetirizine (ZYRTEC) 10 MG tablet Take 10 mg by mouth daily as needed (seasonal allergies).     . clopidogrel (PLAVIX) 75 MG tablet Take 1 tablet (75 mg total) by mouth daily. 90 tablet 3  . fluticasone (FLONASE) 50 MCG/ACT nasal spray PLACE 2 SPRAYS INTO BOTH NOSTRILS DAILY. (Patient taking differently: PLACE 2 SPRAYS INTO BOTH NOSTRILS DAILY AS NEEDED FOR SEASONAL ALLERGIES) 16 g 11  . ibuprofen (ADVIL,MOTRIN) 200 MG tablet Take 400 mg by mouth 2 (two) times daily as needed (pain).    . nitroGLYCERIN (NITROSTAT) 0.4 MG SL tablet Place 0.4 mg under the tongue every 5 (five) minutes as needed for chest pain.     . pantoprazole (PROTONIX) 40 MG tablet TAKE 1 TABLET (40 MG TOTAL) BY MOUTH DAILY BEFORE BREAKFAST. 30 tablet 7  . Pitavastatin Calcium (LIVALO) 2 MG TABS Take 1 tablet (2 mg total) by mouth daily. 30 tablet 3   No current facility-administered medications on file prior to visit.    Allergies  Allergen Reactions  . Aggrenox [Aspirin-Dipyridamole Er] Tinitus  . Codeine Itching    Nose  itches  . Imitrex [Sumatriptan Base] Swelling    Throat swelling  . Isosorbide Nitrate Other (See Comments)    headaches  . Other Other (See Comments)    Artificial sweeteners cause heart palpitations (stevia is ok)  . Rosuvastatin Other (See Comments)    Restless legs  . Plavix [Clopidogrel Bisulfate] Itching and Rash   Social History   Social History  . Marital status: Married    Spouse name: N/A  . Number of children: 1  . Years of education: N/A   Occupational History  . Mechanical designer    Social History Main Topics  . Smoking status: Current Every Day Smoker    Packs/day: 0.30    Types: Cigarettes  . Smokeless tobacco: Never Used  . Alcohol use Yes     Comment: occasional; 1 per day  . Drug use: No  . Sexual activity: Not  on file   Other Topics Concern  . Not on file   Social History Narrative  . No narrative on file      Review of Systems  All other systems reviewed and are negative.      Objective:   Physical Exam  Constitutional: She appears well-developed and well-nourished.  Cardiovascular: Normal rate, regular rhythm and normal heart sounds.   Pulmonary/Chest: Effort normal and breath sounds normal.  Musculoskeletal:       Lumbar back: She exhibits decreased range of motion, tenderness and pain. She exhibits no bony tenderness and no spasm.  Neurological: She has normal reflexes. No cranial nerve deficit. She exhibits normal muscle tone. Coordination normal.  Vitals reviewed.         Assessment & Plan:  Midline low back pain with right-sided sciatica - Plan: DG Lumbar Spine Complete, HYDROcodone-acetaminophen (NORCO/VICODIN) 5-325 MG tablet  I suspect spondylosis/degenerative disc disease with possible nerve impingement. I will give the patient hydrocodone/acetaminophen 5/325 one tablet every 6 hours as needed for pain. She states that she only gets severe pain on the day she rides a horse area and she may need one or 2 tablets after riding the horse. She only does this 3 days a month. Therefore 6 or 7 tablet should be enough for the month. Therefore 30 pills may last up to 6 months. I will also obtain an x-ray of the back to rule out other significant pathology

## 2016-01-25 ENCOUNTER — Encounter: Payer: Self-pay | Admitting: Family Medicine

## 2016-01-30 ENCOUNTER — Encounter: Payer: Self-pay | Admitting: Family Medicine

## 2016-02-03 ENCOUNTER — Encounter: Payer: Self-pay | Admitting: Neurology

## 2016-02-03 ENCOUNTER — Ambulatory Visit (INDEPENDENT_AMBULATORY_CARE_PROVIDER_SITE_OTHER): Payer: 59 | Admitting: Neurology

## 2016-02-03 VITALS — BP 146/84 | HR 77 | Ht 68.0 in | Wt 171.0 lb

## 2016-02-03 DIAGNOSIS — E785 Hyperlipidemia, unspecified: Secondary | ICD-10-CM | POA: Diagnosis not present

## 2016-02-03 DIAGNOSIS — I679 Cerebrovascular disease, unspecified: Secondary | ICD-10-CM

## 2016-02-03 DIAGNOSIS — I10 Essential (primary) hypertension: Secondary | ICD-10-CM

## 2016-02-03 DIAGNOSIS — H53462 Homonymous bilateral field defects, left side: Secondary | ICD-10-CM | POA: Diagnosis not present

## 2016-02-03 DIAGNOSIS — F17200 Nicotine dependence, unspecified, uncomplicated: Secondary | ICD-10-CM

## 2016-02-03 NOTE — Progress Notes (Signed)
NEUROLOGY FOLLOW UP OFFICE NOTE  Janet [name] [id number]  HISTORY OF PRESENT ILLNESS: Janet [name] is a 50 year old right-handed woman with CAD, smoker, hypertension, hyperlipidemia, asthma, arthritis, depression, NASH and liver hemangioma and history of migraines who follows up for TIA.  UPDATE: A couple of weeks ago, she had an episode of left sided vision loss in both eyes, lasting for about 20 seconds.  She denies any other symptoms, such as slurred speech, focal weakness, focal numbness or headache.  She is still smoking.   HISTORY: She had a TIA in March 2014, presenting as left sided numbness of her head and face.  Carotid doppler performed on 08/12/12 showed no hemodynamically significant ICA stenosis.  She was started on ASA afterwards.     In April 2017, she was riding in the car with her husband when suddenly her left hand became numb with weakness and wrist drop.  It lasted just a few seconds but she also had left perioral numbness lasting several hours.  She denied neck pain, facial weakness, or slurred speech.  She followed up with her PCP, who started her on Plavix.  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  2D echo from 08/26/15 showed EF 60-65% with no cardiac source of emboli.  Carotid doppler showed moderate to large amount of atherosclerotic plaque in both ICAs, but without hemodynamically significant stenosis.  Hgb A1c from 08/16/15 was 5.8 and fasting lipid panel showed total cholesterol was 287, TG 185, HDL 63 and LDL 187.  She was started on Livalo 50m every other day.  Repeat fasting lipid panel from 09/29/15 showed LDL decreased to 125.     In the past, she was unable to tolerate Aggrenox and statins.   She has prior neck surgery with C3 to C5 fusions, however she denies neck pain or radicular pain down the  arms.  She does report numbness in the hands when she goes to sleep, in which she needs to shake it out.   She also has history of ocular migraines.  One involves only the right eye, in which she sees a crescent shape with triangles in her visual field.  The other involves flashing moving lights in both eyes.  There is no associated headache.  She does have history of migraine.  PAST MEDICAL HISTORY: Past Medical History:  Diagnosis Date  . Adenoma of right adrenal gland    Benign  . Allergy    SEASONAL  . Anxiety   . Arthritis   . Asthma   . Barrett's esophagus 02/09/2014  . CAD (coronary artery disease)    MI (2005) concern for coronary vasospasm  . Depression   . Elevated LFTs   . Focal nodular hyperplasia of liver   . GERD (gastroesophageal reflux disease)   . Heart murmur    AS A CHILD  . Hepatic steatosis   . History of adenomatous polyp of colon 02/09/2014  . HLD (hyperlipidemia)   . HTN (hypertension)   . Liver hemangioma   . Liver hemangioma   . Migraine headache   . Myocardial infarction   . Nonalcoholic steatohepatitis (NASH) 12/11/2013   [facility] Pal[facility]     MEDICATIONS: Current Outpatient Prescriptions on File Prior to Visit  Medication Sig Dispense Refill  . amLODipine (NORVASC) 10 MG tablet TAKE  1 TABLET BY MOUTH EVERY DAY 90 tablet 3  . benazepril (LOTENSIN) 20 MG tablet TAKE 1 TABLET BY MOUTH EVERY DAY 90 tablet 0  . butorphanol (STADOL) 10 MG/ML nasal spray USE 1 SPRAY DAILY AS NEEDED (Patient taking differently: USE 1 -2 SPRAY DAILY AS NEEDED FOR MIGRAINES) 2.5 mL 0  . cetirizine (ZYRTEC) 10 MG tablet Take 10 mg by mouth daily as needed (seasonal allergies).     . clopidogrel (PLAVIX) 75 MG tablet Take 1 tablet (75 mg total) by mouth daily. 90 tablet 3  . fluticasone (FLONASE) 50 MCG/ACT nasal spray PLACE 2 SPRAYS INTO BOTH NOSTRILS DAILY. (Patient taking differently: PLACE 2 SPRAYS INTO BOTH NOSTRILS DAILY AS  NEEDED FOR SEASONAL ALLERGIES) 16 g 11  . HYDROcodone-acetaminophen (NORCO/VICODIN) 5-325 MG tablet Take 1 tablet by mouth every 6 (six) hours as needed for moderate pain. 30 tablet 0  . ibuprofen (ADVIL,MOTRIN) 200 MG tablet Take 400 mg by mouth 2 (two) times daily as needed (pain).    . nitroGLYCERIN (NITROSTAT) 0.4 MG SL tablet Place 0.4 mg under the tongue every 5 (five) minutes as needed for chest pain.     . pantoprazole (PROTONIX) 40 MG tablet TAKE 1 TABLET (40 MG TOTAL) BY MOUTH DAILY BEFORE BREAKFAST. 30 tablet 7  . Pitavastatin Calcium (LIVALO) 2 MG TABS Take 1 tablet (2 mg total) by mouth daily. 30 tablet 3   No current facility-administered medications on file prior to visit.     ALLERGIES: Allergies  Allergen Reactions  . Aggrenox [Aspirin-Dipyridamole Er] Tinitus  . Codeine Itching    Nose itches  . Imitrex [Sumatriptan Base] Swelling    Throat swelling  . Isosorbide Nitrate Other (See Comments)    headaches  . Other Other (See Comments)    Artificial sweeteners cause heart palpitations (stevia is ok)  . Rosuvastatin Other (See Comments)    Restless legs  . Plavix [Clopidogrel Bisulfate] Itching and Rash    FAMILY HISTORY: Family History  Problem Relation Age of Onset  . Coronary artery disease Father   . Aneurysm Father     AAA  . Melanoma Father   . Transient ischemic attack Father   . Aneurysm Mother     AAA  . Colon polyps Mother   . Heart disease Mother   . Irritable bowel syndrome Mother   . Diverticulitis Mother   . Other Mother     Evlyn Clines  . Breast cancer Paternal Aunt   . Liver disease Maternal Uncle     SOCIAL HISTORY: Social History   Social History  . Marital status: Married    Spouse name: N/A  . Number of children: 1  . Years of education: N/A   Occupational History  . Mechanical designer    Social History Main Topics  . Smoking status: Current Every Day Smoker    Packs/day: 0.30    Types: Cigarettes  . Smokeless tobacco:  Never Used  . Alcohol use Yes     Comment: occasional; 1 per day  . Drug use: No  . Sexual activity: Not on file   Other Topics Concern  . Not on file   Social History Narrative  . No narrative on file    REVIEW OF SYSTEMS: Constitutional: No fevers, chills, or sweats, no generalized fatigue, change in appetite Eyes: No visual changes, double vision, eye pain Ear, nose and throat: No hearing loss, ear pain, nasal congestion, sore throat Cardiovascular: No chest pain, palpitations Respiratory:  No shortness of  breath at rest or with exertion, wheezes GastrointestinaI: No nausea, vomiting, diarrhea, abdominal pain, fecal incontinence Genitourinary:  No dysuria, urinary retention or frequency Musculoskeletal:  No neck pain, back pain Integumentary: No rash, pruritus, skin lesions Neurological: as above Psychiatric: No depression, insomnia, anxiety Endocrine: No palpitations, fatigue, diaphoresis, mood swings, change in appetite, change in weight, increased thirst Hematologic/Lymphatic:  No purpura, petechiae. Allergic/Immunologic: no itchy/runny eyes, nasal congestion, recent allergic reactions, rashes  PHYSICAL EXAM: Vitals:   02/03/16 1131  BP: (!) 146/84  Pulse: 77   General: No acute distress.  Patient appears well-groomed.  normal body habitus. Head:  Normocephalic/atraumatic Eyes:  Fundi examined but not visualized Neck: supple, no paraspinal tenderness, full range of motion Heart:  Regular rate and rhythm Lungs:  Clear to auscultation bilaterally Back: No paraspinal tenderness Neurological Exam: alert and oriented to person, place, and time. Attention span and concentration intact, recent and remote memory intact, fund of knowledge intact.  Speech fluent and not dysarthric, language intact.  CN II-XII intact. Bulk and tone normal, muscle strength 5/5 throughout.  Sensation to light touch  intact.  Deep tendon reflexes 2+ throughout.  Finger to nose testing intact.  Gait  normal, Romberg negative.  IMPRESSION: Transient left homonymous hemianopia Cerebrovascular disease with history of stroke and TIA Hypertension Hyperlipidemia Cigarette smoker  PLAN: 1.  We will check MRI of brain and MRA of head and neck 2.  We will also set her up for 30 day cardiac monitor to assess for atrial fibrillation. 3.  She will continue Livalo.  We will check fasting lipid panel.  LDL goal less than 70. 4.  She will continue Plavix for secondary stroke prevention. 5.  Optimize blood pressure control.   6.  Discussed smoking cessation 7.  Further recommendations pending test results.  Follow up in approximately 3 months.  20 minutes spent face to face with patient, over 50% spent counseling.  Metta Clines, DO  CC: Jenna Luo, MD

## 2016-02-03 NOTE — Addendum Note (Signed)
Addended by: Gerda Diss A on: 02/03/2016 02:03 PM   Modules accepted: Orders

## 2016-02-03 NOTE — Patient Instructions (Addendum)
1. We will check MRI of brain and MRA of head and neck. We have sent a referral to [facility] for your MRI and they will call you directly to schedule your appt. They are located at [place]. If you need to contact them directly please call [phone]. 2.  We will set you up for a 30 day cardiac monitor. We will call you after this is scheduled.  3.  Continue Plavix, Livalo and blood pressure control 4.  Quit smoking 5.  Check fasting lipid panel. On a day that you are fasting, please come to our office to check in for your blood work. You will then go to [facility] ([id number]) on the second floor of this building. You do not need to check in at their office. If you are not called within 15 minutes please check with the front desk.  6.  Follow up in 3 months.

## 2016-02-09 ENCOUNTER — Other Ambulatory Visit: Payer: 59

## 2016-02-09 DIAGNOSIS — H53462 Homonymous bilateral field defects, left side: Secondary | ICD-10-CM

## 2016-02-09 DIAGNOSIS — E785 Hyperlipidemia, unspecified: Secondary | ICD-10-CM

## 2016-02-09 DIAGNOSIS — I679 Cerebrovascular disease, unspecified: Secondary | ICD-10-CM

## 2016-02-09 LAB — LIPID PANEL
CHOL/HDL RATIO: 3.4 ratio (ref <=5.0)
Cholesterol: 251 mg/dL — ABNORMAL HIGH (ref 125–200)
HDL: 73 mg/dL (ref >=46)
LDL CALC: 147 mg/dL — AB (ref <130)
Triglycerides: 154 mg/dL — ABNORMAL HIGH (ref <150)
VLDL: 31 mg/dL — ABNORMAL HIGH (ref <30)

## 2016-02-10 ENCOUNTER — Telehealth: Payer: Self-pay

## 2016-02-10 NOTE — Telephone Encounter (Signed)
I would recommend that she contact Dr. Dennard Schaumann to discuss other medications that would help reduce the LDL.  I will send him a message as well.

## 2016-02-10 NOTE — Telephone Encounter (Signed)
-----   Message from Pieter Partridge, DO sent at 02/10/2016  7:07 AM EDT ----- The LDL (bad cholesterol) is high.  It is 147 (which is higher than 4 months ago when it was 125).  It should be less than 70.  She is on Livalo, which is a low-potency statin.  I think she should be switched to Lipitor 76m daily, which is a high-potency statin.

## 2016-02-10 NOTE — Telephone Encounter (Signed)
Spoke with patient. She stated that she cannot take Lipitor. States she has tried most of the statin medications and they cause severe leg cramping and that is why she is on the Livalo. Please advise.

## 2016-02-14 ENCOUNTER — Encounter: Payer: Self-pay | Admitting: Family Medicine

## 2016-02-16 ENCOUNTER — Ambulatory Visit (INDEPENDENT_AMBULATORY_CARE_PROVIDER_SITE_OTHER): Payer: 59 | Admitting: Family Medicine

## 2016-02-16 VITALS — BP 122/74 | HR 78 | Temp 99.6°F | Resp 14 | Ht 68.0 in | Wt 175.0 lb

## 2016-02-16 DIAGNOSIS — Z8673 Personal history of transient ischemic attack (TIA), and cerebral infarction without residual deficits: Secondary | ICD-10-CM

## 2016-02-16 DIAGNOSIS — F17200 Nicotine dependence, unspecified, uncomplicated: Secondary | ICD-10-CM | POA: Diagnosis not present

## 2016-02-16 MED ORDER — PITAVASTATIN CALCIUM 4 MG PO TABS: 4.0000 mg | ORAL_TABLET | Freq: Every day | ORAL | 11 refills | Status: DC

## 2016-02-16 MED ORDER — VARENICLINE TARTRATE 0.5 MG X 11 & 1 MG X 42 PO MISC: ORAL | 0 refills | Status: DC

## 2016-02-16 NOTE — Progress Notes (Signed)
Subjective:    Patient ID: Janet [name], female    DOB: [birth year], 50 y.o.   MRN: [id number]  HPI  07/2015 She experienced a TIA in 2014 in March. At that time carotid Dopplers revealed less than 39% blockage in the internal carotid arteries. She's been on aspirin ever since. This weekend, the patient was riding in a car with her husband when her left hand suddenly became numb. She states that the hand was not in a position where it would have "fallen asleep". Shortly after becoming numb the hand became paralyzed. This was all distal to the wrist. Symptoms lasted several minutes and then resolve spontaneously. A roughly the same time, her lips became numb. However she denies any facial paralysis. She denies any slurred speech or drooping eyelids. She denied any facial droop or lip droop. She denied any involvement in her legs. She denies any worry vision or vision changes or diplopia. Symptoms resolved with just in a few minutes. The numbness in her lips lasted several hours. She states that she is tried every statin and is unable to tolerate it. Therefore she is not on a statin. She had itching when she tried Plavix in the past. She was unable to tolerate Aggrenox. She continues to smoke. She does have a history of migraines but has not had a migraine in several years.  AT that time, my plan was: Today she is completely asymptomatic. Symptoms occurred while she was taking aspirin. Therefore I have asked the patient to add Plavix 75 mg daily to aspirin. In the past the itching I am not sure was related to Plavix as it took several weeks for the itching subsided even after she stopped Plavix. I will repeat an echocardiogram of the heart to rule out cardio embolic sources of stroke. I'll also perform carotid Dopplers to evaluate for critical carotid artery stenosis. Strongly recommended smoking cessation and I gave the patient a starter pack for Chantix. I will also obtain an MRI depending upon the  results of these tests, I would also consider a neurology consultation to evaluate for other causes of transient neurologic deficits such as MS although her symptoms are atypical for this as well. I'll also check a CMP fasting lipid panel and hemoglobin A1c. I will try to get her on a statin if possible. Consider livalo which she has not taken.  02/16/16 Patient's recent LDL was greater than 140 on livalo 2 mg a day. She denies any muscle aches on this medication. She is tried and failed numerous other statins and zetia.  She is still smoking Past Medical History:  Diagnosis Date  . Adenoma of right adrenal gland    Benign  . Allergy    SEASONAL  . Anxiety   . Arthritis   . Asthma   . Barrett's esophagus 02/09/2014  . CAD (coronary artery disease)    MI (2005) concern for coronary vasospasm  . Depression   . Elevated LFTs   . Focal nodular hyperplasia of liver   . GERD (gastroesophageal reflux disease)   . Heart murmur    AS A CHILD  . Hepatic steatosis   . History of adenomatous polyp of colon 02/09/2014  . HLD (hyperlipidemia)   . HTN (hypertension)   . Liver hemangioma   . Liver hemangioma   . Migraine headache   . Myocardial infarction   . Nonalcoholic steatohepatitis (NASH) 12/11/2013   sees [facility] at [facility]  . Palpitations  Past Surgical History:  Procedure Laterality Date  . ABDOMINOPLASTY    . BLADDER SUSPENSION    . CERVICAL FUSION     C4, 5, 6  . COLONOSCOPY W/ BIOPSIES    . ESOPHAGOGASTRODUODENOSCOPY    . TONSILLECTOMY    . TUBAL LIGATION     Current Outpatient Prescriptions on File Prior to Visit  Medication Sig Dispense Refill  . amLODipine (NORVASC) 10 MG tablet TAKE 1 TABLET BY MOUTH EVERY DAY 90 tablet 3  . benazepril (LOTENSIN) 20 MG tablet TAKE 1 TABLET BY MOUTH EVERY DAY 90 tablet 0  . butorphanol (STADOL) 10 MG/ML nasal spray USE 1 SPRAY DAILY AS NEEDED (Patient taking differently: USE 1 -2 SPRAY DAILY AS NEEDED FOR  MIGRAINES) 2.5 mL 0  . cetirizine (ZYRTEC) 10 MG tablet Take 10 mg by mouth daily as needed (seasonal allergies).     . clopidogrel (PLAVIX) 75 MG tablet Take 1 tablet (75 mg total) by mouth daily. 90 tablet 3  . fluticasone (FLONASE) 50 MCG/ACT nasal spray PLACE 2 SPRAYS INTO BOTH NOSTRILS DAILY. (Patient taking differently: PLACE 2 SPRAYS INTO BOTH NOSTRILS DAILY AS NEEDED FOR SEASONAL ALLERGIES) 16 g 11  . HYDROcodone-acetaminophen (NORCO/VICODIN) 5-325 MG tablet Take 1 tablet by mouth every 6 (six) hours as needed for moderate pain. 30 tablet 0  . ibuprofen (ADVIL,MOTRIN) 200 MG tablet Take 400 mg by mouth 2 (two) times daily as needed (pain).    . nitroGLYCERIN (NITROSTAT) 0.4 MG SL tablet Place 0.4 mg under the tongue every 5 (five) minutes as needed for chest pain.     . pantoprazole (PROTONIX) 40 MG tablet TAKE 1 TABLET (40 MG TOTAL) BY MOUTH DAILY BEFORE BREAKFAST. 30 tablet 7  . Pitavastatin Calcium (LIVALO) 2 MG TABS Take 1 tablet (2 mg total) by mouth daily. 30 tablet 3   No current facility-administered medications on file prior to visit.    Allergies  Allergen Reactions  . Aggrenox [Aspirin-Dipyridamole Er] Tinitus  . Codeine Itching    Nose itches  . Imitrex [Sumatriptan Base] Swelling    Throat swelling  . Isosorbide Nitrate Other (See Comments)    headaches  . Other Other (See Comments)    Artificial sweeteners cause heart palpitations (stevia is ok)  . Rosuvastatin Other (See Comments)    Restless legs  . Plavix [Clopidogrel Bisulfate] Itching and Rash   Social History   Social History  . Marital status: Married    Spouse name: N/A  . Number of children: 1  . Years of education: N/A   Occupational History  . Mechanical designer    Social History Main Topics  . Smoking status: Current Every Day Smoker    Packs/day: 0.30    Types: Cigarettes  . Smokeless tobacco: Never Used  . Alcohol use Yes     Comment: occasional; 1 per day  . Drug use: No  . Sexual  activity: Not on file   Other Topics Concern  . Not on file   Social History Narrative  . No narrative on file      Review of Systems  All other systems reviewed and are negative.      Objective:   Physical Exam  Constitutional: She is oriented to person, place, and time. She appears well-developed and well-nourished. No distress.  Neck: No JVD present.  Cardiovascular: Normal rate, regular rhythm and normal heart sounds.  Exam reveals no friction rub.   No murmur heard. Pulses:      Carotid  pulses are 2+ on the right side, and 2+ on the left side. Pulmonary/Chest: Effort normal and breath sounds normal. No respiratory distress. She has no wheezes. She has no rales. She exhibits no tenderness.  Abdominal: Soft. Bowel sounds are normal.  Neurological: She is alert and oriented to person, place, and time. She has normal reflexes. No cranial nerve deficit. She exhibits normal muscle tone. Coordination normal.  Skin: She is not diaphoretic.  Vitals reviewed.         Assessment & Plan:  History of TIA HLD Increase Livalo 4 mg a day. Continue dual antiplatelet therapy. Recheck fasting lipid panel in 3 months. Add co-Q10 over-the-counter to reduce the chance of muscle aches. I did give the patient a prescription for Chantix and encouraged smoking cessation

## 2016-02-17 ENCOUNTER — Ambulatory Visit
Admission: RE | Admit: 2016-02-17 | Discharge: 2016-02-17 | Disposition: A | Discharge status: Home / Self Care | Payer: 59 | Source: Ambulatory Visit | Attending: Neurology | Admitting: Neurology

## 2016-02-17 DIAGNOSIS — H53462 Homonymous bilateral field defects, left side: Secondary | ICD-10-CM

## 2016-02-17 DIAGNOSIS — E785 Hyperlipidemia, unspecified: Secondary | ICD-10-CM

## 2016-02-17 DIAGNOSIS — I679 Cerebrovascular disease, unspecified: Secondary | ICD-10-CM

## 2016-02-17 MED ORDER — GADOBENATE DIMEGLUMINE 529 MG/ML IV SOLN
15.0000 mL | Freq: Once | INTRAVENOUS | Status: AC | PRN
  Administered 2016-02-17: 15 mL via INTRAVENOUS

## 2016-02-20 ENCOUNTER — Ambulatory Visit (INDEPENDENT_AMBULATORY_CARE_PROVIDER_SITE_OTHER): Payer: 59

## 2016-02-20 ENCOUNTER — Telehealth: Payer: Self-pay

## 2016-02-20 ENCOUNTER — Other Ambulatory Visit: Payer: Self-pay | Admitting: Neurology

## 2016-02-20 DIAGNOSIS — G458 Other transient cerebral ischemic attacks and related syndromes: Secondary | ICD-10-CM

## 2016-02-20 DIAGNOSIS — I639 Cerebral infarction, unspecified: Secondary | ICD-10-CM | POA: Diagnosis not present

## 2016-02-20 DIAGNOSIS — I4891 Unspecified atrial fibrillation: Secondary | ICD-10-CM

## 2016-02-20 DIAGNOSIS — R002 Palpitations: Secondary | ICD-10-CM

## 2016-02-20 DIAGNOSIS — R9389 Abnormal findings on diagnostic imaging of other specified body structures: Secondary | ICD-10-CM

## 2016-02-20 NOTE — Telephone Encounter (Signed)
-----   Message from Pieter Partridge, DO sent at 02/20/2016 11:56 AM EDT ----- There is no evidence of stroke.  There is something seen with one of the arteries in the neck which may be just artifact.  To bet a better view, I would like to order CTA of the neck.

## 2016-02-20 NOTE — Telephone Encounter (Signed)
Message relayed to patient. Verbalized understanding and denied questions. Order placed.

## 2016-03-13 ENCOUNTER — Other Ambulatory Visit: Payer: Self-pay | Admitting: Family Medicine

## 2016-03-26 ENCOUNTER — Other Ambulatory Visit: Payer: Self-pay | Admitting: Family Medicine

## 2016-03-26 NOTE — Telephone Encounter (Signed)
Ok to refill Stadol?

## 2016-03-26 NOTE — Telephone Encounter (Signed)
ok 

## 2016-03-29 ENCOUNTER — Ambulatory Visit
Admission: RE | Admit: 2016-03-29 | Discharge: 2016-03-29 | Disposition: A | Discharge status: Home / Self Care | Payer: 59 | Source: Ambulatory Visit | Attending: Neurology | Admitting: Neurology

## 2016-03-29 DIAGNOSIS — R9389 Abnormal findings on diagnostic imaging of other specified body structures: Secondary | ICD-10-CM

## 2016-03-29 MED ORDER — IOPAMIDOL (ISOVUE-370) INJECTION 76%
75.0000 mL | Freq: Once | INTRAVENOUS | Status: AC | PRN
  Administered 2016-03-29: 75 mL via INTRAVENOUS

## 2016-03-30 ENCOUNTER — Telehealth: Payer: Self-pay

## 2016-03-30 DIAGNOSIS — Q254 Congenital malformation of aorta unspecified: Secondary | ICD-10-CM

## 2016-03-30 NOTE — Telephone Encounter (Signed)
Referral placed to : [facility]   Address: [address], [place], [place] [address] Phone: [phone]

## 2016-03-30 NOTE — Telephone Encounter (Signed)
-----   Message from Pieter Partridge, DO sent at 03/30/2016  9:18 AM EST ----- I called and spoke with Janet [name] about the CTA results.  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  She will need to be evaluated by a vascular specialist.  I'm not sure if this would be treated by vascular surgery or thoracic surgery.  We will look into that and get back to her about scheduling a consult with a specialist.

## 2016-04-03 NOTE — Progress Notes (Signed)
[address]       [place] [address]             [phone]                    Janet [name] [place] Medical Record #[id number] Date of Birth: [birth year]  Referring: Pieter Partridge, DO Primary Care: Odette Fraction, MD  Chief Complaint:    Chief Complaint  Patient presents with  . 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 female is seen in the office  today for Abnormal CT scan of the neck done 03/29/2016. The patient is a 50 year old female with known history of vascular disease including having a myocardial infarction in 2005 treated medically. In the spring of 2017 patient noted visual changes in her eyes with a "crescent shaped" visual defect but she could not remember which. Subsequently she had episode of left hand numbness which lasted about 30 seconds and numbness of the left side of her mouth and face. She also describes visual field deficit toward the left visual field transiently in the summer. Since these episodes she has been placed on Plavix, she had been on long-term aspirin. She been intolerant of multiple statins in the past.  Because of these symptoms she was seen by neurology, MRI of the brain and neck was performed was an abnormality in the proximal left carotid origin and a CT scan/CTA of the neck was performed.   The patient also describes increasing fatigue with minimal exertion and shortness of breath, she denies any specific chest discomfort or chest pain.  She is a long-term smoker and continues to smoke.      Current Activity/ Functional Status:  Patient is independent with mobility/ambulation, transfers, ADL's, IADL's.   Zubrod Score: At the time of surgery this patient's most appropriate activity status/level should be described as: []     0    Normal activity, no symptoms []     1     Restricted in physical strenuous activity but ambulatory, able to do out light work []     2    Ambulatory and capable of self care, unable to do work activities, up and about               >50 % of waking hours                              []     3    Only limited self care, in bed greater than 50% of waking hours []     4    Completely disabled, no self care, confined to bed or chair []     5    Moribund   Past Medical History:  Diagnosis Date  . Adenoma of right adrenal gland    Benign  . Allergy    SEASONAL  . Anxiety   . Arthritis   . Asthma   . Barrett's esophagus 02/09/2014  . CAD (coronary artery disease)    MI (2005) concern for coronary vasospasm  . Depression   . Elevated LFTs   . Focal nodular hyperplasia of liver   . GERD (gastroesophageal reflux disease)   . Heart murmur    AS A CHILD  . Hepatic steatosis   .  History of adenomatous polyp of colon 02/09/2014  . HLD (hyperlipidemia)   . HTN (hypertension)   . Liver hemangioma   . Liver hemangioma   . Migraine headache   . Myocardial infarction   . Nonalcoholic steatohepatitis (NASH) 12/11/2013   sees Roosevelt Locks at [facility]  . Palpitations     Past Surgical History:  Procedure Laterality Date  . ABDOMINOPLASTY    . BLADDER SUSPENSION    . CERVICAL FUSION     C4, 5, 6  . COLONOSCOPY W/ BIOPSIES    . ESOPHAGOGASTRODUODENOSCOPY    . TONSILLECTOMY    . TUBAL LIGATION      Family History  Problem Relation Age of Onset  . Coronary artery disease Father   . Aneurysm Father     AAA  . Melanoma Father   . Transient ischemic attack Father   . Aneurysm Mother     AAA  . Colon polyps Mother   . Heart disease Mother   . Irritable bowel syndrome Mother   . Diverticulitis Mother   . Other Mother     Evlyn [name]  . Breast cancer Paternal Aunt   . Liver disease Maternal Uncle     Social History   Social History  . Marital status: Married    Spouse name: N/A  . Number of children: 1  .  Years of education: N/A   Occupational History  . Mechanical designer    Social History Main Topics  . Smoking status: Current Every Day Smoker    Packs/day: 0.30    Types: Cigarettes  . Smokeless tobacco: Never Used  . Alcohol use Yes     Comment: occasional; 1 per day  . Drug use: No  . Sexual activity: Not on file   Other Topics Concern  . Not on file   Social History Narrative  . No narrative on file    History  Smoking Status  . Current Every Day Smoker  . Packs/day: 0.30  . Types: Cigarettes  Smokeless Tobacco  . Never Used    History  Alcohol Use  . Yes    Comment: occasional; 1 per day     Allergies  Allergen Reactions  . Aggrenox [Aspirin-Dipyridamole Er] Tinitus  . Codeine Itching    Nose itches  . Imitrex [Sumatriptan Base] Swelling    Throat swelling  . Isosorbide Nitrate Other (See Comments)    headaches  . Other Other (See Comments)    Artificial sweeteners cause heart palpitations (stevia is ok)  . Rosuvastatin Other (See Comments)    Restless legs    Current Outpatient Prescriptions  Medication Sig Dispense Refill  . amLODipine (NORVASC) 10 MG tablet TAKE 1 TABLET EVERY DAY 90 tablet 2  . benazepril (LOTENSIN) 20 MG tablet TAKE 1 TABLET BY MOUTH EVERY DAY 90 tablet 0  . butorphanol (STADOL) 10 MG/ML nasal spray USE 1 SPRAY IN ONE NOSTIL ONCE A DAY AS NEEDED ONLY 2.5 mL 1  . cetirizine (ZYRTEC) 10 MG tablet Take 10 mg by mouth daily as needed (seasonal allergies).     . clopidogrel (PLAVIX) 75 MG tablet Take 1 tablet (75 mg total) by mouth daily. 90 tablet 3  . fluticasone (FLONASE) 50 MCG/ACT nasal spray PLACE 2 SPRAYS INTO BOTH NOSTRILS DAILY. (Patient taking differently: PLACE 2 SPRAYS INTO BOTH NOSTRILS DAILY AS NEEDED FOR SEASONAL ALLERGIES) 16 g 11  . HYDROcodone-acetaminophen (NORCO/VICODIN) 5-325 MG tablet Take 1 tablet by mouth every 6 (six) hours as needed  for moderate pain. 30 tablet 0  . ibuprofen (ADVIL,MOTRIN) 200 MG tablet  Take 400 mg by mouth 2 (two) times daily as needed (pain).    . nitroGLYCERIN (NITROSTAT) 0.4 MG SL tablet Place 0.4 mg under the tongue every 5 (five) minutes as needed for chest pain.     . pantoprazole (PROTONIX) 40 MG tablet TAKE 1 TABLET (40 MG TOTAL) BY MOUTH DAILY BEFORE BREAKFAST. 30 tablet 7  . Pitavastatin Calcium (LIVALO) 4 MG TABS Take 1 tablet (4 mg total) by mouth daily. 30 tablet 11   No current facility-administered medications for this visit.       Review of Systems:     Cardiac Review of Systems: Y or N  Chest Pain [ n   ]  Resting SOB [ n  ] Exertional SOB  Blue.Reese  ]  Orthopnea [  ]   Pedal Edema [   n]    Palpitations Florencio.Farrier  ] Syncope  Florencio.Farrier  ]   Presyncope [ n  ]  General Review of Systems: [Y] = yes [  ]=no Constitional: recent weight change [n  ];  Wt loss over the last 3 months [   ] anorexia [  ]; fatigue [ y ]; nausea [  ]; night sweats [  ]; fever [  ]; or chills [  ];          Dental: poor dentition[  ]; Last Dentist visit:   Eye : blurred vision [  ]; diplopia [   ]; vision changes [  ];  Amaurosis fugax[  ]; Resp: cough [  ];  wheezing[y  ];  hemoptysis[ n ]; shortness of breath[y  ]; paroxysmal nocturnal dyspnea[  ]; dyspnea on exertion[ y ]; or orthopnea[  ];  GI:  gallstones[  ], vomiting[  ];  dysphagia[  ]; melena[  ];  hematochezia [  ]; heartburn[  ];   Hx of  Colonoscopy[  ]; GU: kidney stones [  ]; hematuria[  ];   dysuria [  ];  nocturia[  ];  history of     obstruction [  ]; urinary frequency [  ]             Skin: rash, swelling[  ];, hair loss[  ];  peripheral edema[  ];  or itching[  ]; Musculosketetal: myalgias[  ];  joint swelling[  ];  joint erythema[  ];  joint pain[  ];  back pain[  ];  Heme/Lymph: bruising[  ];  bleeding[  ];  anemia[  ];  Neuro: TIA[ t ];  headaches[  ];  stroke[  t];  vertigo[  n];  seizures[n  ];   paresthesias[ n ];  difficulty walking[ny  ];  Psych:depression[  ]; anxiety[  ];  Endocrine: diabetes[n  ];  thyroid dysfunction[   ];  Immunizations: Flu up to date [  ]; Pneumococcal up to date [  ];  Other:  Physical Exam: BP (!) 139/95   Pulse 82   Resp 20   Ht 5' 8"  (1.727 m)   Wt 174 lb (78.9 kg)   SpO2 95% Comment: RA  BMI 26.46 kg/m  Blood pressure was in the left arm.  PHYSICAL EXAMINATION: General appearance: alert, cooperative, appears older than stated age and no distress Head: Normocephalic, without obvious abnormality, atraumatic Neck: no adenopathy, no carotid bruit, no JVD, supple, symmetrical, trachea midline and thyroid not enlarged, symmetric, no tenderness/mass/nodules Lymph nodes: Cervical, supraclavicular, and axillary  nodes normal. Resp: clear to auscultation bilaterally Back: symmetric, no curvature. ROM normal. No CVA tenderness. Cardio: regular rate and rhythm, S1, S2 normal, no murmur, click, rub or gallop GI: soft, non-tender; bowel sounds normal; no masses,  no organomegaly Extremities: extremities normal, atraumatic, no cyanosis or edema, Homans sign is negative, no sign of DVT and patient has palpable DP and PT pulses bilaterally Neurologic: Grossly normal  Diagnostic Studies & Laboratory data:     Recent Radiology Findings:   Ct Angio Neck W Or Wo Contrast  Addendum Date: 04/03/2016   ADDENDUM REPORT: 03/29/2016 17:03 ADDENDUM: Study discussed by telephone with Dr. Quita Skye JAFFE on 03/29/2016 at 1652 hours. Electronically Signed   By: Genevie Ann M.D.   On: 03/29/2016 17:03   Addendum Date: 03/29/2016   ADDENDUM REPORT: 03/29/2016 17:03 ADDENDUM: Study discussed by telephone with Dr. Quita Skye JAFFE on 03/29/2016 at 1652 hours. Electronically Signed   By: Genevie Ann M.D.   On: 03/29/2016 17:03   Result Date: 03/29/2016 CLINICAL DATA:  50 year old female with possible aortic arch pseudoaneurysm on neck MRA performed in October for temporary loss of vision. Subsequent encounter. EXAM: CT ANGIOGRAPHY NECK TECHNIQUE: Multidetector CT imaging of the neck was performed using the standard protocol  during bolus administration of intravenous contrast. Multiplanar CT image reconstructions and MIPs were obtained to evaluate the vascular anatomy. Carotid stenosis measurements (when applicable) are obtained utilizing NASCET criteria, using the distal internal carotid diameter as the denominator. CONTRAST:  75 mL Isovue 370 COMPARISON:  MRI brain and head and neck MRA 02/17/2016. FINDINGS: Skeleton: Previous cervical ACDF C5-C6 and C6-C7 with solid arthrodesis. No acute osseous abnormality identified. Visualized paranasal sinuses and mastoids are stable and well pneumatized. Upper chest: Minimal paraseptal emphysema in the lung apices. There are multiple small mostly sub solid appearing pulmonary nodules in the both upper lobes measuring up to 5 mm (series 5). No superior mediastinal lymphadenopathy. Other neck: Negative thyroid, larynx, pharynx, parapharyngeal spaces, retropharyngeal space, sublingual space, submandibular glands and parotid glands. Cervical lymph nodes are within normal limits. Negative visualized posterior fossa. Aortic arch: Abnormal aortic arch, corresponding to the MRA finding on 02/17/2016. There is a saccular 15 mm long lesion directed superiorly arising from or adjacent to the left common carotid artery origin (series 603, image 104). The lesion is about 11 mm diameter. This seems to narrow the true lumen of the proximal left CCA, and on axial images does somewhat resemble a dissection (Series 4, image 18). Superimposed mild soft and calcified arch atherosclerosis. Partially visible but ectatic appearing distal ascending aorta, up to 42 mm diameter. Right carotid system: No brachiocephalic or right CCA origin stenosis. Negative right CCA. Mild soft and calcified plaque at the right ICA origin and bulb without stenosis. Tortuous but otherwise negative cervical right ICA. Negative visible right ICA siphon. Left carotid system: Abnormal proximal left CCA as detailed in the arch section above.  No hemodynamically significant stenosis of the proximal left CCA true lumen. Negative left CCA otherwise. Mild soft and calcified plaque at the left ICA origin and bulb without stenosis. Tortuous but otherwise negative cervical left ICA. Negative visible left ICA siphon. Vertebral arteries: No proximal right subclavian artery stenosis. Normal right vertebral artery origin. Normal right vertebral artery to the vertebrobasilar junction. Normal right PICA origin. No proximal left subclavian artery stenosis. Normal origin of the left vertebral artery which is dominant. Mildly ectatic but otherwise normal left vertebral artery to the skullbase. Mild calcified plaque in the left V4  segment without stenosis. Normal left PICA origin. Tortuous left vertebrobasilar junction. IMPRESSION: 1. Confirmed abnormal aortic arch as suspected on the October neck MRA. 15 mm long by 11 mm diameter superiorly directed pseudoaneurysm (or less likely dissection of the left CCA origin) situated between the brachiocephalic and left CCA origins. See series 603, image 104. Minimal arch atherosclerosis otherwise. Recommend Vascular Surgery consultation. 2. No significant narrowing of the proximal left CCA/left CCA true lumen. No arterial stenosis in the neck. Mild bilateral ICA origin atherosclerosis. 3. Ectatic visualized ascending aorta, up to 42 mm diameter. This meets consensus criteria for annual imaging followup by CTA or MRA (as per 2010 ACCF/AHA/AATS/ACR/ASA/SCA/SCAI/SIR/STS/SVM Guidelines for the Diagnosis and Management of Patients with Thoracic Aortic Disease. Circulation. 2010; 121: T552-Z747). Electronically Signed: By: Genevie Ann M.D. On: 03/29/2016 16:45     I have independently reviewed the above radiologic studies.  Recent Lab Findings: Lab Results  Component Value Date   WBC 10.3 11/27/2014   HGB 16.2 (H) 11/27/2014   HCT 48.3 (H) 11/27/2014   PLT 230 11/27/2014   GLUCOSE 86 08/16/2015   CHOL 251 (H) 02/09/2016    TRIG 154 (H) 02/09/2016   HDL 73 02/09/2016   LDLDIRECT 173.6 12/17/2011   LDLCALC 147 (H) 02/09/2016   ALT 60 (H) 08/16/2015   AST 39 (H) 08/16/2015   NA 139 08/16/2015   K 4.6 08/16/2015   CL 102 08/16/2015   CREATININE 0.66 08/16/2015   BUN 11 08/16/2015   CO2 25 08/16/2015   TSH 3.00 05/30/2009   INR 0.94 12/25/2013   HGBA1C 5.8 (H) 08/16/2015      Assessment / Plan:   #1 long-standing tobacco use #2 history of myocardial infarction 2005 at age 48 #3 evidence on MRI of old left hemispheric stroke #4 patient has multiple bilateral 3-4 mm pulmonary nodules-will need follow-up scan #5 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 I'll strongly encouraged her to immediately stop smoking. She has had incomplete studies of her entire thoracic aorta. I recommended to her that we obtain a follow-up CT scan in March. She will be referred to cardiology with her history of myocardial infarction in the past at a young age and increasing symptoms of fatigue with minimal exertion. We'll have her also see vascular surgery, Dr. Trula Slade    I  spent 40 minutes counseling the patient face to face and 50% or more the  time was spent in counseling and coordination of care. The total time spent in the appointment was 60 minutes.  Grace Isaac MD      [address] [place] [address] Office [phone]   Beeper [phone]  04/04/2016 4:30 PM

## 2016-04-04 ENCOUNTER — Institutional Professional Consult (permissible substitution) (INDEPENDENT_AMBULATORY_CARE_PROVIDER_SITE_OTHER): Payer: 59 | Admitting: Cardiothoracic Surgery

## 2016-04-04 ENCOUNTER — Encounter: Payer: Self-pay | Admitting: Cardiothoracic Surgery

## 2016-04-04 VITALS — BP 139/95 | HR 82 | Resp 20 | Ht 68.0 in | Wt 174.0 lb

## 2016-04-04 DIAGNOSIS — I7771 Dissection of carotid artery: Secondary | ICD-10-CM | POA: Diagnosis not present

## 2016-04-06 ENCOUNTER — Other Ambulatory Visit: Payer: Self-pay

## 2016-04-06 DIAGNOSIS — I7771 Dissection of carotid artery: Secondary | ICD-10-CM

## 2016-04-12 ENCOUNTER — Telehealth: Payer: Self-pay

## 2016-04-12 NOTE — Telephone Encounter (Signed)
Result sent via mychart.

## 2016-04-12 NOTE — Telephone Encounter (Signed)
-----   Message from Pieter Partridge, DO sent at 04/12/2016  9:52 AM EST ----- The cardiac monitor did not reveal any obvious arrhythmias.  I believe that she should have been referred to cardiology by the thoracic surgeon.

## 2016-04-17 ENCOUNTER — Other Ambulatory Visit: Payer: Self-pay | Admitting: Internal Medicine

## 2016-05-07 ENCOUNTER — Encounter: Payer: Self-pay | Admitting: Internal Medicine

## 2016-05-07 ENCOUNTER — Ambulatory Visit (INDEPENDENT_AMBULATORY_CARE_PROVIDER_SITE_OTHER): Payer: 59 | Admitting: Internal Medicine

## 2016-05-07 VITALS — BP 154/103 | HR 69 | Ht 68.0 in | Wt 179.2 lb

## 2016-05-07 DIAGNOSIS — I25810 Atherosclerosis of coronary artery bypass graft(s) without angina pectoris: Secondary | ICD-10-CM | POA: Diagnosis not present

## 2016-05-07 DIAGNOSIS — E782 Mixed hyperlipidemia: Secondary | ICD-10-CM

## 2016-05-07 DIAGNOSIS — I10 Essential (primary) hypertension: Secondary | ICD-10-CM | POA: Diagnosis not present

## 2016-05-07 DIAGNOSIS — G459 Transient cerebral ischemic attack, unspecified: Secondary | ICD-10-CM | POA: Diagnosis not present

## 2016-05-07 NOTE — Progress Notes (Signed)
OFFICE NOTE  Chief Complaint:  Establish cardiologist  Primary Care Physician: Odette Fraction, MD  HPI:  Janet [name] is a 51 y.o. female with a history of moderate coronary artery disease and MI in 2005, at which time she was noted to have a 60-70% distal LAD stenosis and a 60-70% ostial D1 stenosis on cath. There is no clear culprit lesion and she did not receive a stent. She was thought to have vasospasm. Since then she's had numerous stress tests without any recurrent ischemia and she denies any recurrent chest pain. She has had ongoing problems with TIA and has been on and off of Plavix. Mostly she notes visual field cuts as well as arm and facial numbness. She has been seen by Dr. Tomi Likens with neurology and had an echocardiogram this past year which showed an EF of 60-65%, mild LVH, mild AI and mild TR. A bubble study was not performed. She's also had bilateral carotid artery ultrasounds which showed moderate to large amount of atherosclerotic plaque on the right greater than left without hemodynamically significant stenosis. 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 She was then referred to Dr. Servando Snare with thoracic surgery. It was also noted that she had multiple subcentimeter pulmonary nodules which require follow-up. There are no plans for surgery at this time. She was placed on a monitor which demonstrated no obvious arrhythmias or atrial fibrillation. She has a history of statin intolerance in the past. She is previously been on Lipitor, Crestor, Zocor, Pravachol and is currently tolerating Livalo, however, LDL-C on 02/09/2016 was 147, which represents poor control given her high carotid artery plaque burden and history of coronary artery disease (ASCVD).   PMHx:  Past Medical History:  Diagnosis Date  . Adenoma of right adrenal gland    Benign  . Allergy    SEASONAL  . Anxiety   . Arthritis   .  Asthma   . Barrett's esophagus 02/09/2014  . CAD (coronary artery disease)    MI (2005) concern for coronary vasospasm  . Depression   . Elevated LFTs   . Focal nodular hyperplasia of liver   . GERD (gastroesophageal reflux disease)   . Heart murmur    AS A CHILD  . Hepatic steatosis   . History of adenomatous polyp of colon 02/09/2014  . HLD (hyperlipidemia)   . HTN (hypertension)   . Liver hemangioma   . Liver hemangioma   . Migraine headache   . Myocardial infarction   . Nonalcoholic steatohepatitis (NASH) 12/11/2013   sees Roosevelt Locks at [facility]  . Palpitations     Past Surgical History:  Procedure Laterality Date  . ABDOMINOPLASTY    . BLADDER SUSPENSION    . CERVICAL FUSION     C4, 5, 6  . COLONOSCOPY W/ BIOPSIES    . ESOPHAGOGASTRODUODENOSCOPY    . TONSILLECTOMY    . TUBAL LIGATION      FAMHx:  Family History  Problem Relation Age of Onset  . Coronary artery disease Father   . Aneurysm Father     AAA  . Melanoma Father   . Transient ischemic attack Father   . Aneurysm Mother     AAA  . Colon polyps Mother   . Heart disease Mother   . Irritable bowel syndrome Mother   . Diverticulitis Mother   . Other Mother     Evlyn Clines  .  Breast cancer Paternal Aunt   . Liver disease Maternal Uncle     SOCHx:   reports that she has been smoking Cigarettes.  She has been smoking about 0.30 packs per day. She has never used smokeless tobacco. She reports that she drinks alcohol. She reports that she does not use drugs.  ALLERGIES:  Allergies  Allergen Reactions  . Aggrenox [Aspirin-Dipyridamole Er] Tinitus  . Codeine Itching    Nose itches  . Imitrex [Sumatriptan Base] Swelling    Throat swelling  . Isosorbide Nitrate Other (See Comments)    headaches  . Other Other (See Comments)    Artificial sweeteners cause heart palpitations (stevia is ok)  . Rosuvastatin Other (See Comments)    Restless legs    ROS: Pertinent items noted in HPI  and remainder of comprehensive ROS otherwise negative.  HOME MEDS: Current Outpatient Prescriptions on File Prior to Visit  Medication Sig Dispense Refill  . amLODipine (NORVASC) 10 MG tablet TAKE 1 TABLET EVERY DAY 90 tablet 2  . benazepril (LOTENSIN) 20 MG tablet TAKE 1 TABLET BY MOUTH EVERY DAY 90 tablet 0  . butorphanol (STADOL) 10 MG/ML nasal spray USE 1 SPRAY IN ONE NOSTIL ONCE A DAY AS NEEDED ONLY 2.5 mL 1  . cetirizine (ZYRTEC) 10 MG tablet Take 10 mg by mouth daily as needed (seasonal allergies).     . clopidogrel (PLAVIX) 75 MG tablet Take 1 tablet (75 mg total) by mouth daily. 90 tablet 3  . fluticasone (FLONASE) 50 MCG/ACT nasal spray PLACE 2 SPRAYS INTO BOTH NOSTRILS DAILY. (Patient taking differently: PLACE 2 SPRAYS INTO BOTH NOSTRILS DAILY AS NEEDED FOR SEASONAL ALLERGIES) 16 g 11  . HYDROcodone-acetaminophen (NORCO/VICODIN) 5-325 MG tablet Take 1 tablet by mouth every 6 (six) hours as needed for moderate pain. 30 tablet 0  . ibuprofen (ADVIL,MOTRIN) 200 MG tablet Take 400 mg by mouth 2 (two) times daily as needed (pain).    . nitroGLYCERIN (NITROSTAT) 0.4 MG SL tablet Place 0.4 mg under the tongue every 5 (five) minutes as needed for chest pain.     . pantoprazole (PROTONIX) 40 MG tablet TAKE 1 TABLET (40 MG TOTAL) BY MOUTH DAILY BEFORE BREAKFAST. 30 tablet 0  . Pitavastatin Calcium (LIVALO) 4 MG TABS Take 1 tablet (4 mg total) by mouth daily. 30 tablet 11   No current facility-administered medications on file prior to visit.     LABS/IMAGING: No results found for this or any previous visit (from the past 48 hour(s)). No results found.  WEIGHTS: Wt Readings from Last 3 Encounters:  05/07/16 179 lb 3.2 oz (81.3 kg)  04/04/16 174 lb (78.9 kg)  02/16/16 175 lb (79.4 kg)    VITALS: BP (!) 154/103   Pulse 69   Ht 5' 8"  (1.727 m)   Wt 179 lb 3.2 oz (81.3 kg)   BMI 27.25 kg/m   EXAM: General appearance: alert and no distress Neck: no carotid bruit and no JVD Lungs:  clear to auscultation bilaterally Heart: regular rate and rhythm Abdomen: soft, non-tender; bowel sounds normal; no masses,  no organomegaly Extremities: extremities normal, atraumatic, no cyanosis or edema Pulses: 2+ and symmetric Skin: Skin color, texture, turgor normal. No rashes or lesions Neurologic: Grossly normal Psych: Pleasant  EKG: Normal sinus rhythm at 69, minimal voltage criteria for LVH, lateral T-wave changes  ASSESSMENT: 1. Moderate nonobstructive CAD with history of MI in 2005 2. Recurrent TIAs without clear etiology 3. 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 Multiple bilateral pulmonary nodules  5. 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 Mixed dyslipidemia 8. Tobacco abuse - quit x 1 month ago  PLAN: 1.   Janet [name] has a complex cardiovascular history coupled with significant anxiety. Blood pressure initially was 154/103 today became down to 138/90. She said she did take her medicines this morning. She has a history of nonobstructive coronary disease with an MI in 2005 although did not require stent was thought to have vasospasm. She is not had recurrent chest pain problems since then. Most of her issues deal with recurrent TIAs. She did have an echo earlier this year which was fairly unremarkable except for some mild aortic insufficiency. She did not have a bubble study. I would like to get a limited echo to rule out significant PFO. Further workup with regards to possible localized dissection of the left carotid artery and/or aortic pseudoaneurysm will be followed up by Drs. Servando Snare and Brabham. I commended her on smoking cessation and encouraged her to continue to quit. She is been intolerant to a number of statins and currently is on pitavastatin 4 mg, but reports some myalgias that were not present on the 2 mg dose. Despite this her LDL-C is still 147. She is a good candidate for a PCSK9 inhibitor. I will refer her for  evaluation of Repatha. I also discussed enrollment in the Orion-10 trial which we are currently enrolling patients in, for patients with ASCVD and including statin intolerance, to study a new PCSK9 inhibitor which is a siRNA molecule. She was not interested at this time.  Thanks for the kind referral. Follow-up with me in 3 months.  Pixie Casino, MD, [facility] Attending Cardiologist Pettis C Adoni Greenough 05/07/2016, 11:43 AM

## 2016-05-07 NOTE — Patient Instructions (Addendum)
Your physician recommends that you schedule a follow-up appointment with [facility] (pharmacy appointment) - for Repatha initiation   Your physician has requested that you have a limited echocardiogram w/bubble study @ [address] - [address]rd [address]. Echocardiography is a painless test that uses sound waves to create images of your heart. It provides your doctor with information about the size and shape of your heart and how well your heart's chambers and valves are working. This procedure takes approximately one hour. There are no restrictions for this procedure.  Your physician wants you to follow-up in: [place] with Dr. Debara Pickett.

## 2016-05-17 ENCOUNTER — Ambulatory Visit: Payer: 59 | Admitting: Neurology

## 2016-05-23 ENCOUNTER — Other Ambulatory Visit: Payer: Self-pay

## 2016-05-23 ENCOUNTER — Ambulatory Visit (HOSPITAL_COMMUNITY): Payer: 59 | Attending: Cardiology

## 2016-05-23 DIAGNOSIS — E785 Hyperlipidemia, unspecified: Secondary | ICD-10-CM | POA: Diagnosis not present

## 2016-05-23 DIAGNOSIS — J45909 Unspecified asthma, uncomplicated: Secondary | ICD-10-CM | POA: Diagnosis not present

## 2016-05-23 DIAGNOSIS — R002 Palpitations: Secondary | ICD-10-CM | POA: Insufficient documentation

## 2016-05-23 DIAGNOSIS — R011 Cardiac murmur, unspecified: Secondary | ICD-10-CM | POA: Diagnosis not present

## 2016-05-23 DIAGNOSIS — G459 Transient cerebral ischemic attack, unspecified: Secondary | ICD-10-CM

## 2016-05-23 DIAGNOSIS — I252 Old myocardial infarction: Secondary | ICD-10-CM | POA: Diagnosis not present

## 2016-05-23 DIAGNOSIS — I10 Essential (primary) hypertension: Secondary | ICD-10-CM | POA: Insufficient documentation

## 2016-05-23 DIAGNOSIS — I2510 Atherosclerotic heart disease of native coronary artery without angina pectoris: Secondary | ICD-10-CM | POA: Diagnosis not present

## 2016-05-31 ENCOUNTER — Ambulatory Visit (INDEPENDENT_AMBULATORY_CARE_PROVIDER_SITE_OTHER): Payer: 59 | Admitting: Pharmacist

## 2016-05-31 DIAGNOSIS — E782 Mixed hyperlipidemia: Secondary | ICD-10-CM | POA: Diagnosis not present

## 2016-05-31 NOTE — Progress Notes (Signed)
Patient ID: ISEL SKUFCA                 DOB: 1966/04/27                    MRN: [id number]     HPI: Janet [name] is a 51 y.o. female patient referred to lipid clinic by Dr Debara Pickett.  PMI relevant for 60-70% distal LAD stenosis and a 60-70% ostial D1 stenosis , history of TIA , EF of 60-65%, mild LVH, mild bilateral carotid artery with moderate to large amount of atherosclerotic plaque on the right greater than left . She has a history of statin intolerance in the past.   Patient presents to pharmacist clinic today for possible Repatha initiation.  Current Medications: Pitavastatin 58m daily  Intolerances: rosuvastatin 548m atorvastatin 1043msimvastatin, pravastatin and zetia 10 mg Generalized  myalgia and restless leg reported with all statins. Leg pain reported with Zetia.  Risk Factors: CAD, HTN, HLD, ASCVD with high carotid arterial plaque and hx of multiple TIA  LDL goal: <70  Diet: [place] lunch out but mainly salads. Other meals at home; grilled foods, avoid fried foods   Exercise: daily walking mainly at work and horseback riding  Family History: Father- Bypass, stents, cholesterol; brother - stents and elevated LDL  Social History: quits smoking, denies smokeless tobacco; occassional wine glass  2-3 glasses/week  Labs: CHO 215 ; TG 154; HDL 73; LDL 147 (on Pitavastatin 2mg36m Past Medical History:  Diagnosis Date  . Adenoma of right adrenal gland    Benign  . Allergy    SEASONAL  . Anxiety   . Arthritis   . Asthma   . Barrett's esophagus 02/09/2014  . CAD (coronary artery disease)    MI (2005) concern for coronary vasospasm  . Depression   . Elevated LFTs   . Focal nodular hyperplasia of liver   . GERD (gastroesophageal reflux disease)   . Heart murmur    AS A CHILD  . Hepatic steatosis   . History of adenomatous polyp of colon 02/09/2014  . HLD (hyperlipidemia)   . HTN (hypertension)   . Liver hemangioma   . Liver hemangioma   . Migraine headache     . Myocardial infarction   . Nonalcoholic steatohepatitis (NASH) 12/11/2013   [facility]     Current Outpatient Prescriptions on File Prior to Visit  Medication Sig Dispense Refill  . amLODipine (NORVASC) 10 MG tablet TAKE 1 TABLET EVERY DAY 90 tablet 2  . benazepril (LOTENSIN) 20 MG tablet TAKE 1 TABLET BY MOUTH EVERY DAY 90 tablet 0  . butorphanol (STADOL) 10 MG/ML nasal spray USE 1 SPRAY IN ONE NOSTIL ONCE A DAY AS NEEDED ONLY 2.5 mL 1  . cetirizine (ZYRTEC) 10 MG tablet Take 10 mg by mouth daily as needed (seasonal allergies).     . clopidogrel (PLAVIX) 75 MG tablet Take 1 tablet (75 mg total) by mouth daily. 90 tablet 3  . fluticasone (FLONASE) 50 MCG/ACT nasal spray PLACE 2 SPRAYS INTO BOTH NOSTRILS DAILY. (Patient taking differently: PLACE 2 SPRAYS INTO BOTH NOSTRILS DAILY AS NEEDED FOR SEASONAL ALLERGIES) 16 g 11  . HYDROcodone-acetaminophen (NORCO/VICODIN) 5-325 MG tablet Take 1 tablet by mouth every 6 (six) hours as needed for moderate pain. 30 tablet 0  . ibuprofen (ADVIL,MOTRIN) 200 MG tablet Take 400 mg by mouth 2 (two) times daily as needed (pain).    .Marland Kitchen  nitroGLYCERIN (NITROSTAT) 0.4 MG SL tablet Place 0.4 mg under the tongue every 5 (five) minutes as needed for chest pain.     . pantoprazole (PROTONIX) 40 MG tablet TAKE 1 TABLET (40 MG TOTAL) BY MOUTH DAILY BEFORE BREAKFAST. 30 tablet 0  . Pitavastatin Calcium (LIVALO) 4 MG TABS Take 1 tablet (4 mg total) by mouth daily. 30 tablet 11   No current facility-administered medications on file prior to visit.     Allergies  Allergen Reactions  . Aggrenox [Aspirin-Dipyridamole Er] Tinitus  . Atorvastatin Other (See Comments)    Myalgias   . Codeine Itching    Nose itches  . Imitrex [Sumatriptan Base] Swelling    Throat swelling  . Isosorbide Nitrate Other (See Comments)    headaches  . Other Other (See Comments)    Artificial sweeteners cause heart palpitations (stevia is  ok)  . Rosuvastatin Other (See Comments)    Restless legs  . Simvastatin Other (See Comments)    Myalgias    Mixed Hyperlipidemia:  Patient with history of TIA, 60-70% ALD stenosis and elevated LDL while on maximum tolerated statin. Noted patient following lifestyle modifications as well. Documented intolerant to rosuvastatin 50m, atorvastatin 127m simvastatin, pravastatin and ezetimibe 1033m Option of PCSK9 inhibitor discussed with patient and she is willing to try medication.  Device use, injection administration video and SE discussed during this visit.  Repatha copay discount card also provided.  Will submit paperwork for pre-authorization and follow up with patient as needed.  Vaniah Chambers Rodriguez-Guzman PharmD, BCPG[facility] [id number] 7:49 AM

## 2016-05-31 NOTE — Patient Instructions (Signed)
LDL goal for you is <70 (current level 147) Continue lifestyle modifications (physyscal activity, avoid fried foods, weight loss) Call clinic if question -  [phone] Breely Panik or Kristin    Cholesterol Cholesterol is a fat. Your body needs a small amount of cholesterol. Cholesterol (plaque) may build up in your blood vessels (arteries). That makes you more likely to have a heart attack or stroke. You cannot feel your cholesterol level. Having a blood test is the only way to find out if your level is high. Keep your test results. Work with your doctor to keep your cholesterol at a good level. What do the results mean?  Total cholesterol is how much cholesterol is in your blood.  LDL is bad cholesterol. This is the type that can build up. Try to have low LDL.  HDL is good cholesterol. It cleans your blood vessels and carries LDL away. Try to have high HDL.  Triglycerides are fat that the body can store or burn for energy. What are good levels of cholesterol?  Total cholesterol below 200.  LDL below 100 is good for people who have health risks. LDL below 70 is good for people who have very high risks.  HDL above 40 is good. It is best to have HDL of 60 or higher.  Triglycerides below 150. How can I lower my cholesterol? Diet  Follow your diet program as told by your doctor.  Choose fish, white meat chicken, or [place] that is roasted or baked. Try not to eat red meat, fried foods, sausage, or lunch meats.  Eat lots of fresh fruits and vegetables.  Choose whole grains, beans, pasta, potatoes, and cereals.  Choose olive oil, corn oil, or canola oil. Only use small amounts.  Try not to eat butter, mayonnaise, shortening, or palm kernel oils.  Try not to eat foods with trans fats.  Choose low-fat or nonfat dairy foods.  Drink skim or nonfat milk.  Eat low-fat or nonfat yogurt and cheeses.  Try not to drink whole milk or cream.  Try not to eat ice cream, egg yolks, or  full-fat cheeses.  Healthy desserts include angel food cake, ginger snaps, animal crackers, hard candy, popsicles, and low-fat or nonfat frozen yogurt. Try not to eat pastries, cakes, pies, and cookies. Exercise  Follow your exercise program as told by your doctor.  Be more active. Try gardening, walking, and taking the stairs.  Ask your doctor about ways that you can be more active. Medicine  Take over-the-counter and prescription medicines only as told by your doctor. This information is not intended to replace advice given to you by your health care provider. Make sure you discuss any questions you have with your health care provider. Document Released: 07/13/2008 Document Revised: 11/16/2015 Document Reviewed: 10/27/2015 Elsevier Interactive Patient Education  2017 [employer].

## 2016-06-06 ENCOUNTER — Ambulatory Visit (INDEPENDENT_AMBULATORY_CARE_PROVIDER_SITE_OTHER): Payer: 59 | Admitting: Surgery

## 2016-06-06 ENCOUNTER — Encounter: Payer: Self-pay | Admitting: Surgery

## 2016-06-06 ENCOUNTER — Ambulatory Visit (HOSPITAL_COMMUNITY)
Admission: RE | Admit: 2016-06-06 | Discharge: 2016-06-06 | Disposition: A | Discharge status: Home / Self Care | Payer: 59 | Source: Ambulatory Visit | Attending: Surgery | Admitting: Surgery

## 2016-06-06 VITALS — BP 133/89 | HR 75 | Temp 98.2°F | Resp 18 | Ht 68.0 in | Wt 188.4 lb

## 2016-06-06 DIAGNOSIS — I6523 Occlusion and stenosis of bilateral carotid arteries: Secondary | ICD-10-CM | POA: Diagnosis not present

## 2016-06-06 DIAGNOSIS — I7771 Dissection of carotid artery: Secondary | ICD-10-CM

## 2016-06-06 DIAGNOSIS — I6522 Occlusion and stenosis of left carotid artery: Secondary | ICD-10-CM | POA: Diagnosis not present

## 2016-06-06 LAB — VAS US CAROTID
LCCADDIAS: 16 cm/s
LCCADSYS: 110 cm/s
LCCAPDIAS: 24 cm/s
LCCAPSYS: 73 cm/s
LEFT ECA DIAS: -22 cm/s
LEFT VERTEBRAL DIAS: 12 cm/s
LICADDIAS: -22 cm/s
LICADSYS: -59 cm/s
LICAPDIAS: -18 cm/s
LICAPSYS: -51 cm/s
RCCAPSYS: 77 cm/s
RIGHT CCA MID DIAS: -18 cm/s
RIGHT ECA DIAS: -28 cm/s
RIGHT VERTEBRAL DIAS: -10 cm/s
Right CCA prox dias: 19 cm/s
Right cca dist sys: -42 cm/s

## 2016-06-06 NOTE — Progress Notes (Signed)
[facility] [place]  Patient name: Janet [name] MRN: [id number] DOB: 12-31-1965 Sex: female   REFERRING PROVIDER:    Dr. Servando Snare   REASON FOR CONSULT:    TIA  HISTORY OF PRESENT ILLNESS:   Janet [name] is a 51 y.o. female, who is Today for evaluation of a history of amaurosis fugax on the left.  She tells me that she initially began having a crescent shaped visual defect in the summer of 2017.  She reports having approximately 2-3 of these episodes which lasted about 30 seconds.  The initial and she describes having some facial numbness.  She did not have any weakness or numbness in her extremities.  During her workup she was found to have an old infarct on the left and a possible carotid dissection which was confirmed with CT scan.  She has a history of myocardial infarction which was treated medically and 2005.  She has been a long-term smoker however she has recently quit.  She suffers from hypercholesterolemia.  She has been intolerant of statins and is being evaluated for Repatha.  She is currently on aspirin and Plavix although she does not routinely take her aspirin.  She had a negative bubble study.  PAST MEDICAL HISTORY    Past Medical History:  Diagnosis Date  . Adenoma of right adrenal gland    Benign  . Allergy    SEASONAL  . Anxiety   . Arthritis   . Asthma   . Barrett's esophagus 02/09/2014  . CAD (coronary artery disease)    MI (2005) concern for coronary vasospasm  . Depression   . Elevated LFTs   . Focal nodular hyperplasia of liver   . GERD (gastroesophageal reflux disease)   . Heart murmur    AS A CHILD  . Hepatic steatosis   . History of adenomatous polyp of colon 02/09/2014  . HLD (hyperlipidemia)   . HTN (hypertension)   . Liver hemangioma   . Liver hemangioma   . Migraine headache   . Myocardial infarction   . Nonalcoholic steatohepatitis (NASH) 12/11/2013   sees [facility]  at [facility]  . Palpitations   . Stroke [facility])      FAMILY HISTORY   Family History  Problem Relation Age of Onset  . Coronary artery disease Father   . Aneurysm Father     AAA  . Melanoma Father   . Transient ischemic attack Father   . Heart disease Father   . AAA (abdominal aortic aneurysm) Father   . Aneurysm Mother     AAA  . Colon polyps Mother   . Heart disease Mother   . Irritable bowel syndrome Mother   . Diverticulitis Mother   . Other Mother     Evlyn Clines  . AAA (abdominal aortic aneurysm) Mother   . Breast cancer Paternal Aunt   . Liver disease Maternal Uncle   . Heart disease Brother     SOCIAL HISTORY:   Social History   Social History  . Marital status: Married    Spouse name: N/A  . Number of children: 1  . Years of education: N/A   Occupational History  . Mechanical designer    Social History Main Topics  . Smoking status: Former Smoker    Packs/day: 0.30    Types: Cigarettes    Quit date: 03/30/2016  . Smokeless tobacco: Never Used  . Alcohol use Yes     Comment: occasional; 1 per  day  . Drug use: No  . Sexual activity: Not on file   Other Topics Concern  . Not on file   Social History Narrative  . No narrative on file    ALLERGIES:    Allergies  Allergen Reactions  . Aggrenox [Aspirin-Dipyridamole Er] Tinitus  . Atorvastatin Other (See Comments)    Myalgias   . Codeine Itching    Nose itches  . Imitrex [Sumatriptan Base] Swelling    Throat swelling  . Isosorbide Nitrate Other (See Comments)    headaches  . Other Other (See Comments)    Artificial sweeteners cause heart palpitations (stevia is ok)  . Rosuvastatin Other (See Comments)    Restless legs  . Simvastatin Other (See Comments)    Myalgias    CURRENT MEDICATIONS:    Current Outpatient Prescriptions  Medication Sig Dispense Refill  . amLODipine (NORVASC) 10 MG tablet TAKE 1 TABLET EVERY DAY 90 tablet 2  . benazepril (LOTENSIN) 20 MG tablet  TAKE 1 TABLET BY MOUTH EVERY DAY 90 tablet 0  . butorphanol (STADOL) 10 MG/ML nasal spray USE 1 SPRAY IN ONE NOSTIL ONCE A DAY AS NEEDED ONLY 2.5 mL 1  . clopidogrel (PLAVIX) 75 MG tablet Take 1 tablet (75 mg total) by mouth daily. 90 tablet 3  . fluticasone (FLONASE) 50 MCG/ACT nasal spray PLACE 2 SPRAYS INTO BOTH NOSTRILS DAILY. (Patient taking differently: PLACE 2 SPRAYS INTO BOTH NOSTRILS DAILY AS NEEDED FOR SEASONAL ALLERGIES) 16 g 11  . ibuprofen (ADVIL,MOTRIN) 200 MG tablet Take 400 mg by mouth 2 (two) times daily as needed (pain).    . pantoprazole (PROTONIX) 40 MG tablet TAKE 1 TABLET (40 MG TOTAL) BY MOUTH DAILY BEFORE BREAKFAST. 30 tablet 0  . Pitavastatin Calcium (LIVALO) 4 MG TABS Take 1 tablet (4 mg total) by mouth daily. 30 tablet 11  . cetirizine (ZYRTEC) 10 MG tablet Take 10 mg by mouth daily as needed (seasonal allergies).     Marland Kitchen HYDROcodone-acetaminophen (NORCO/VICODIN) 5-325 MG tablet Take 1 tablet by mouth every 6 (six) hours as needed for moderate pain. (Patient not taking: Reported on 06/06/2016) 30 tablet 0  . nitroGLYCERIN (NITROSTAT) 0.4 MG SL tablet Place 0.4 mg under the tongue every 5 (five) minutes as needed for chest pain.      No current facility-administered medications for this visit.     REVIEW OF SYSTEMS:   [X]  denotes positive finding, [ ]  denotes negative finding Cardiac  Comments:  Chest pain or chest pressure:    Shortness of breath upon exertion:    Short of breath when lying flat:    Irregular heart rhythm:        Vascular    Pain in calf, thigh, or hip brought on by ambulation:    Pain in feet at night that wakes you up from your sleep:     Blood clot in your veins:    Leg swelling:         Pulmonary    Oxygen at home:    Productive cough:     Wheezing:         Neurologic    Sudden weakness in arms or legs:     Sudden numbness in arms or legs:     Sudden onset of difficulty speaking or slurred speech:    Temporary loss of vision in one  eye:  x   Problems with dizziness:         Gastrointestinal    Blood in stool:  Vomited blood:         Genitourinary    Burning when urinating:     Blood in urine:        Psychiatric    Major depression:         Hematologic    Bleeding problems:    Problems with blood clotting too easily:        Skin    Rashes or ulcers:        Constitutional    Fever or chills:     PHYSICAL EXAM:   Vitals:   06/06/16 1451 06/06/16 1454  BP: (!) 135/94 133/89  Pulse: 75   Resp: 18   Temp: 98.2 F (36.8 C)   TempSrc: Oral   SpO2: 97%   Weight: 188 lb 6.4 oz (85.5 kg)   Height: 5' 8"  (1.727 m)     GENERAL: The patient is a well-nourished female, in no acute distress. The vital signs are documented above. CARDIAC: There is a regular rate and rhythm.  VASCULAR: No carotid bruit PULMONARY: Nonlabored respirations MUSCULOSKELETAL: There are no major deformities or cyanosis. NEUROLOGIC: No focal weakness or paresthesias are detected. SKIN: There are no ulcers or rashes noted. PSYCHIATRIC: The patient has a normal affect.  STUDIES:   MRI HEAD:   No acute brain finding. Mild chronic small-vessel ischemic changes, premature for age. Small old left posterior frontal cortical infarction.  ASSESSMENT and PLAN   Proximal left carotid dissection: The patient is scheduled for repeat CT scan in March.  However I have contemplated scheduling her for angiography as I feel this may give [place] more information.  She potentially would be a candidate for stenting.  This could be evaluated at the time of her arteriogram.  The decision would be whether or not to Center from an antegrade approach coming from the groin or whether or not she would do best from a small neck incision and a retrograde approach.  I'm going to try to coordinate her arteriogram within the next couple weeks.  I will further discuss this with Dr. Servando Snare.  She will continue her aspirin and Plavix   Annamarie Major,  MD [facility] [phone] Pager [phone]

## 2016-06-07 DIAGNOSIS — K7581 Nonalcoholic steatohepatitis (NASH): Secondary | ICD-10-CM | POA: Diagnosis not present

## 2016-06-11 ENCOUNTER — Encounter: Payer: 59 | Admitting: Surgery

## 2016-06-11 ENCOUNTER — Ambulatory Visit (HOSPITAL_COMMUNITY): Payer: 59

## 2016-06-14 ENCOUNTER — Ambulatory Visit (INDEPENDENT_AMBULATORY_CARE_PROVIDER_SITE_OTHER): Payer: 59 | Admitting: Family Medicine

## 2016-06-14 ENCOUNTER — Encounter: Payer: Self-pay | Admitting: Family Medicine

## 2016-06-14 VITALS — BP 138/88 | HR 74 | Temp 98.8°F | Resp 18 | Ht 68.0 in | Wt 184.0 lb

## 2016-06-14 DIAGNOSIS — J069 Acute upper respiratory infection, unspecified: Secondary | ICD-10-CM

## 2016-06-14 DIAGNOSIS — B9789 Other viral agents as the cause of diseases classified elsewhere: Secondary | ICD-10-CM | POA: Diagnosis not present

## 2016-06-14 LAB — INFLUENZA A AND B AG, IMMUNOASSAY
INFLUENZA A ANTIGEN: NOT DETECTED
INFLUENZA B ANTIGEN: NOT DETECTED

## 2016-06-14 MED ORDER — PREDNISONE 20 MG PO TABS: 40.0000 mg | ORAL_TABLET | Freq: Every day | ORAL | 0 refills | Status: DC

## 2016-06-14 MED ORDER — FLUTICASONE PROPIONATE 50 MCG/ACT NA SUSP: NASAL | 11 refills | Status: DC

## 2016-06-14 MED ORDER — CETIRIZINE HCL 10 MG PO TABS: 10.0000 mg | ORAL_TABLET | Freq: Every day | ORAL | 3 refills | Status: DC | PRN

## 2016-06-14 NOTE — Patient Instructions (Signed)
Take vitamin C Zyrtec, flonase  Given script for prednisone in case your start wheezing/cough worsens Mucinex DM  Flu NEGATIVE F/U as needed

## 2016-06-14 NOTE — Progress Notes (Signed)
   Subjective:    Patient ID: Janet [name], female    DOB: June 15, 1965, 51 y.o.   MRN: [id number]  Patient presents for Illness (x1 day- has had head cold x1 week, but noted increased nasal congestion and fever last night) Last night spiked fever 57F, head congestion/ ear congested, mild cough. No known sick contacts    Taken nyquil, and benadryl  Has exercise induced asthma and allergies, has not used flonase, takes zyrtec  She has not had any wheezing or shortness of breath      Review Of Systems:  GEN- denies fatigue,+ fever, weight loss,weakness, recent illness HEENT- denies eye drainage, change in vision, +nasal discharge, CVS- denies chest pain, palpitations RESP- denies SOB,+ cough, wheeze ABD- denies N/V, change in stools, abd pain GU- denies dysuria, hematuria, dribbling, incontinence MSK- denies joint pain, muscle aches, injury Neuro- denies headache, dizziness, syncope, seizure activity       Objective:    BP 138/88   Pulse 74   Temp 98.8 F (37.1 C) (Oral)   Resp 18   Ht 5' 8"  (1.727 m)   Wt 184 lb (83.5 kg)   SpO2 98%   BMI 27.98 kg/m  GEN- NAD, alert and oriented x3 HEENT- PERRL, EOMI, non injected sclera, pink conjunctiva, MMM, oropharynx mild injection, TM clear bilat no effusion,  No maxillary sinus tenderness, inflammed turbinates,  Nasal drainage  Neck- Supple, no LAD CVS- RRR, no murmur RESP-CTAB EXT- No edema Pulses- Radial 2+          Assessment & Plan:      Problem List Items Addressed This Visit    None    Visit Diagnoses    Viral URI    -  Primary   Viral illness, early in course, she has history of asthma with bronchitis. Ihave given her script of prednisone to keep on hand if she worsens, flu neg, flonase,zyrtec,. Can use Afrin sparingly for 3 days    Relevant Orders   Influenza A and B Ag, Immunoassay (Completed)      Note: This dictation was prepared with Dragon dictation along with smaller phrase technology. Any  transcriptional errors that result from this process are unintentional.

## 2016-06-18 ENCOUNTER — Other Ambulatory Visit: Payer: Self-pay | Admitting: Internal Medicine

## 2016-06-20 ENCOUNTER — Other Ambulatory Visit: Payer: Self-pay | Admitting: *Deleted

## 2016-06-20 ENCOUNTER — Telehealth: Payer: Self-pay | Admitting: Pharmacist

## 2016-06-20 DIAGNOSIS — I712 Thoracic aortic aneurysm, without rupture, unspecified: Secondary | ICD-10-CM

## 2016-06-20 NOTE — Telephone Encounter (Signed)
Gave update to patient on insurance pre-authorization process.  Repatha request denied - Praluent prefer agent  Praluent 24m denied - unknown reason. Currently awaiting appeal.

## 2016-07-19 ENCOUNTER — Ambulatory Visit
Admission: RE | Admit: 2016-07-19 | Discharge: 2016-07-19 | Disposition: A | Discharge status: Home / Self Care | Payer: 59 | Source: Ambulatory Visit | Attending: Cardiothoracic Surgery | Admitting: Cardiothoracic Surgery

## 2016-07-19 ENCOUNTER — Ambulatory Visit (INDEPENDENT_AMBULATORY_CARE_PROVIDER_SITE_OTHER): Payer: 59 | Admitting: Cardiothoracic Surgery

## 2016-07-19 ENCOUNTER — Encounter: Payer: Self-pay | Admitting: Cardiothoracic Surgery

## 2016-07-19 VITALS — BP 136/80 | HR 73 | Resp 16 | Ht 68.0 in | Wt 184.0 lb

## 2016-07-19 DIAGNOSIS — I712 Thoracic aortic aneurysm, without rupture, unspecified: Secondary | ICD-10-CM

## 2016-07-19 DIAGNOSIS — I7771 Dissection of carotid artery: Secondary | ICD-10-CM | POA: Diagnosis not present

## 2016-07-19 DIAGNOSIS — I725 Aneurysm of other precerebral arteries: Secondary | ICD-10-CM | POA: Diagnosis not present

## 2016-07-19 DIAGNOSIS — I6523 Occlusion and stenosis of bilateral carotid arteries: Secondary | ICD-10-CM | POA: Diagnosis not present

## 2016-07-19 MED ORDER — IOPAMIDOL (ISOVUE-370) INJECTION 76%
100.0000 mL | Freq: Once | INTRAVENOUS | Status: AC | PRN
  Administered 2016-07-19: 100 mL via INTRAVENOUS

## 2016-07-19 NOTE — Progress Notes (Signed)
[address]       [place] [name],Janet [name] [id number]             [name]                    Janet [name] Janet [name] Date of Birth: 1965/11/11  Referring: Pieter Partridge, DO Primary Care: Pixie Casino, MD  Chief Complaint:    Chief Complaint  Patient presents with  . Follow-up    with CTA NECK/CHEST...has seen Dr. Debara Pickett and Dr. Trula Slade    History of Present Illness:    Janet [name] 51 y.o. female is seen in the office  today for Abnormal CT scan of the neck done 03/29/2016. The patient is a 51 year old female with known history of vascular disease including having a myocardial infarction in 2005 treated medically. In the spring of 2017 patient noted visual changes in her eyes with a "crescent shaped" visual defect but she could not remember which. Subsequently she had episode of left hand numbness which lasted about 30 seconds and numbness of the left side of her mouth and face. She also describes visual field deficit toward the left visual field transiently in the summer. Since these episodes she has been placed on Plavix, she had been on long-term aspirin. She been intolerant of multiple statins in the past.  Because of these symptoms she was seen by neurology, MRI of the brain and neck was performed was an abnormality in the proximal left carotid origin and a CT scan/CTA of the neck was performed.   The patient also describes increasing fatigue with minimal exertion and shortness of breath, she denies any specific chest discomfort or chest pain.  She is a long-term smoker. Since our discussion in November she and her husband have completely stop smoking.     Current Activity/ Functional Status:  Patient is independent with mobility/ambulation, transfers, ADL's, IADL's.   Zubrod Score: At the time of surgery this patient's most appropriate activity status/level should be described as: []     0    Normal activity, no  symptoms [x]     1    Restricted in physical strenuous activity but ambulatory, able to do out light work []     2    Ambulatory and capable of self care, unable to do work activities, up and about               >50 % of waking hours                              []     3    Only limited self care, in bed greater than 50% of waking hours []     4    Completely disabled, no self care, confined to bed or chair []     5    Moribund   Past Medical History:  Diagnosis Date  . Adenoma of right adrenal gland    Benign  . Allergy    SEASONAL  . Anxiety   . Arthritis   . Asthma   . Barrett's esophagus 02/09/2014  . CAD (coronary artery disease)    MI (2005) concern for coronary vasospasm  . Depression   . Elevated LFTs   . Focal nodular hyperplasia of liver   . GERD (gastroesophageal reflux disease)   . Heart murmur    AS A CHILD  .  Hepatic steatosis   . History of adenomatous polyp of colon 02/09/2014  . HLD (hyperlipidemia)   . HTN (hypertension)   . Liver hemangioma   . Liver hemangioma   . Migraine headache   . Myocardial infarction   . Nonalcoholic steatohepatitis (NASH) 12/11/2013   sees [facility] at [facility]  . Palpitations   . Stroke [facility])     Past Surgical History:  Procedure Laterality Date  . ABDOMINOPLASTY    . BLADDER SUSPENSION    . CERVICAL FUSION     C4, 5, 6  . COLONOSCOPY W/ BIOPSIES    . ESOPHAGOGASTRODUODENOSCOPY    . TONSILLECTOMY    . TUBAL LIGATION      Family History  Problem Relation Age of Onset  . Coronary artery disease Father   . Aneurysm Father     AAA  . Melanoma Father   . Transient ischemic attack Father   . Heart disease Father   . AAA (abdominal aortic aneurysm) Father   . Aneurysm Mother     AAA  . Colon polyps Mother   . Heart disease Mother   . Irritable bowel syndrome Mother   . Diverticulitis Mother   . Other Mother     Evlyn Clines  . AAA (abdominal aortic aneurysm) Mother   . Breast cancer Paternal Aunt    . Liver disease Maternal Uncle   . Heart disease Brother     Social History   Social History  . Marital status: Married    Spouse name: N/A  . Number of children: 1  . Years of education: N/A   Occupational History  . Mechanical designer    Social History Main Topics  . Smoking status: Former Smoker    Packs/day: 0.30    Types: Cigarettes    Quit date: 03/30/2016  . Smokeless tobacco: Never Used  . Alcohol use Yes     Comment: occasional; 1 per day  . Drug use: No  . Sexual activity: Not on file   Other Topics Concern  . Not on file   Social History Narrative  . No narrative on file    History  Smoking Status  . Former Smoker  . Packs/day: 0.30  . Types: Cigarettes  . Quit date: 03/30/2016  Smokeless Tobacco  . Never Used    History  Alcohol Use  . Yes    Comment: occasional; 1 per day     Allergies  Allergen Reactions  . Aggrenox [Aspirin-Dipyridamole Er] Tinitus  . Atorvastatin Other (See Comments)    Myalgias   . Codeine Itching    Nose itches  . Imitrex [Sumatriptan Base] Swelling    Throat swelling  . Isosorbide Nitrate Other (See Comments)    headaches  . Other Other (See Comments)    Artificial sweeteners cause heart palpitations (stevia is ok)  . Rosuvastatin Other (See Comments)    Restless legs  . Simvastatin Other (See Comments)    Myalgias    Current Outpatient Prescriptions  Medication Sig Dispense Refill  . amLODipine (NORVASC) 10 MG tablet TAKE 1 TABLET EVERY DAY 90 tablet 2  . benazepril (LOTENSIN) 20 MG tablet TAKE 1 TABLET BY MOUTH EVERY DAY 90 tablet 0  . butorphanol (STADOL) 10 MG/ML nasal spray USE 1 SPRAY IN ONE NOSTIL ONCE A DAY AS NEEDED ONLY 2.5 mL 1  . cetirizine (ZYRTEC) 10 MG tablet Take 1 tablet (10 mg total) by mouth daily as needed (seasonal allergies). 30 tablet 3  .  clopidogrel (PLAVIX) 75 MG tablet Take 1 tablet (75 mg total) by mouth daily. 90 tablet 3  . fluticasone (FLONASE) 50 MCG/ACT nasal spray PLACE 2  SPRAYS INTO BOTH NOSTRILS DAILY AS NEEDED FOR SEASONAL ALLERGIES 16 g 11  . HYDROcodone-acetaminophen (NORCO/VICODIN) 5-325 MG tablet Take 1 tablet by mouth every 6 (six) hours as needed for moderate pain. 30 tablet 0  . ibuprofen (ADVIL,MOTRIN) 200 MG tablet Take 400 mg by mouth 2 (two) times daily as needed (pain).    . nitroGLYCERIN (NITROSTAT) 0.4 MG SL tablet Place 0.4 mg under the tongue every 5 (five) minutes as needed for chest pain.     . pantoprazole (PROTONIX) 40 MG tablet TAKE 1 TABLET BY MOUTH EVERY DAY BEFORE BREAKFAST 30 tablet 0  . Pitavastatin Calcium (LIVALO) 4 MG TABS Take 1 tablet (4 mg total) by mouth daily. 30 tablet 11   No current facility-administered medications for this visit.       Review of Systems:     Cardiac Review of Systems: Y or N  Chest Pain [ n   ]  Resting SOB [ n  ] Exertional SOB  Blue.Reese  ]  Orthopnea [  ]   Pedal Edema [   n]    Palpitations Florencio.Farrier  ] Syncope  Florencio.Farrier  ]   Presyncope [ n  ]  General Review of Systems: [Y] = yes [  ]=no Constitional: recent weight change [n  ];  Wt loss over the last 3 months [   ] anorexia [  ]; fatigue [ y ]; nausea [  ]; night sweats [  ]; fever [  ]; or chills [  ];          Dental: poor dentition[  ]; Last Dentist visit:   Eye : blurred vision [  ]; diplopia [   ]; vision changes [  ];  Amaurosis fugax[  ]; Resp: cough [  ];  wheezing[y  ];  hemoptysis[ n ]; shortness of breath[y  ]; paroxysmal nocturnal dyspnea[  ]; dyspnea on exertion[ y ]; or orthopnea[  ];  GI:  gallstones[  ], vomiting[  ];  dysphagia[  ]; melena[  ];  hematochezia [  ]; heartburn[  ];   Hx of  Colonoscopy[  ]; GU: kidney stones [  ]; hematuria[  ];   dysuria [  ];  nocturia[  ];  history of     obstruction [  ]; urinary frequency [  ]             Skin: rash, swelling[  ];, hair loss[  ];  peripheral edema[  ];  or itching[  ]; Musculosketetal: myalgias[  ];  joint swelling[  ];  joint erythema[  ];  joint pain[  ];  back pain[  ];  Heme/Lymph: bruising[   ];  bleeding[  ];  anemia[  ];  Neuro: TIA[ t ];  headaches[  ];  stroke[  t];  vertigo[  n];  seizures[n  ];   paresthesias[ n ];  difficulty walking[ny  ];  Psych:depression[  ]; anxiety[  ];  Endocrine: diabetes[n  ];  thyroid dysfunction[  ];  Immunizations: Flu up to date [  ]; Pneumococcal up to date [  ];  Other:  Physical Exam: BP 136/80 (BP Location: Right Arm, Patient Position: Sitting, Cuff Size: Large)   Pulse 73   Resp 16   Ht 5' 8"  (1.727 m)   Wt 184 lb (83.5  kg)   SpO2 98% Comment: ON RA  BMI 27.98 kg/m  Blood pressure was in the left arm.  PHYSICAL EXAMINATION: General appearance: alert, cooperative, appears older than stated age and no distress Head: Normocephalic, without obvious abnormality, atraumatic Neck: no adenopathy, no carotid bruit, no JVD, supple, symmetrical, trachea midline and thyroid not enlarged, symmetric, no tenderness/mass/nodules Lymph nodes: Cervical, supraclavicular, and axillary nodes normal. Resp: clear to auscultation bilaterally Back: symmetric, no curvature. ROM normal. No CVA tenderness. Cardio: regular rate and rhythm, S1, S2 normal, no murmur, click, rub or gallop GI: soft, non-tender; bowel sounds normal; no masses,  no organomegaly Extremities: extremities normal, atraumatic, no cyanosis or edema, Homans sign is negative, no sign of DVT and patient has palpable DP and PT pulses bilaterally Neurologic: Grossly normal  Diagnostic Studies & Laboratory data:     Recent Radiology Findings:   Ct Angio Neck W Or Wo Contrast  Result Date: 07/19/2016 CLINICAL DATA:  51 year old female with aortic arch superiorly directed pseudoaneurysm, situated between the brachiocephalic and left CCA origins, and discovered in 2017. Subsequent encounter. EXAM: CT ANGIOGRAPHY NECK TECHNIQUE: Multidetector CT imaging of the neck was performed using the standard protocol during bolus administration of intravenous contrast. Multiplanar CT image reconstructions  and MIPs were obtained to evaluate the vascular anatomy. Carotid stenosis measurements (when applicable) are obtained utilizing NASCET criteria, using the distal internal carotid diameter as the denominator. CONTRAST:  100 mL Isovue 370 in conjunction with contrast enhanced imaging of the chest reported separately. COMPARISON:  Neck CTA 03/29/2016. Chest CTA today reported separately. FINDINGS: Skeleton: Previous C5-C6 and C6-C7 ACDF. Fusion hardware and arthrodesis appears intact. No acute osseous abnormality identified. Visualized paranasal sinuses and mastoids are stable and well pneumatized. Upper chest: Chest CTA findings are reported separately today. Other neck: Thyroid, larynx, pharynx, parapharyngeal spaces, retropharyngeal space, sublingual space, submandibular glands and parotid glands are within normal limits. No cervical lymphadenopathy. Negative visualized brain parenchyma. Aortic arch: A saccular 13 x 11 x 17 mm (AP by transverse by CC) superiorly directed outpouching from the midportion of the aortic arch adjacent to or arising from the origin of the left common carotid artery appears stable in size and configuration since November (series 301, image 96 today versus series 603, image 104 in 2017). No surrounding soft tissue inflammation. The otherwise 3 vessel arch configuration remain stable. Ectasia of the ascending aorta appears grossly stable and is detailed on the chest study today. Right carotid system: Stable brachiocephalic artery with no stenosis. There is soft and calcified plaque at the brachiocephalic artery bifurcation into the right CCA and proximal right subclavian artery which appears stable without stenosis. Negative right CCA proximal to the bifurcation. Soft and calcified plaque at the posterior right ICA origin and bulb is stable without stenosis. Mildly tortuous cervical right ICA. Negative visible right ICA siphon. Left carotid system: Less than 50 % narrowing at the left CCA  origin (with respect to the distal vessel) related to mass effect from the adjacent arch lesion is stable. On axial images today the proximal left CCA and superiorly directed saccular lesion appear more convincingly contained within the same adventitia, see series 4, image 78. Beyond its origin the left CCA is negative until the bifurcation. At the bifurcation there is calcified and soft atherosclerotic plaque affecting the lateral and posterior walls of the right ICA origin and bulb with no stenosis. Tortuous cervical right ICA distal to the bulb is stable. Negative visible right ICA siphon. Vertebral arteries: No proximal  right subclavian artery stenosis despite calcified plaque. Normal right vertebral artery origin. No proximal left subclavian artery stenosis despite mild soft and calcified plaque. Normal left vertebral artery origin. The left vertebral artery is dominant, but the right has a relatively normal size. No vertebral artery stenosis to the vertebrobasilar junction, and on the left there is only mild left V4 segment calcified plaque. Both PICA origins are patent. Tortuous but otherwise normal vertebrobasilar junction. Review of the MIP images confirms the above findings IMPRESSION: 1. 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 On axial images today this lesion and the adjacent proximal left CCA appear more convincingly contained within the same adventitia (series 4, image 78) suggesting it may have begun as a dissection of the left CCA origin. 2. Other Chest CTA findings today are reported separately. 3. Stable mild bilateral ICA origin and bulb atherosclerosis without stenosis. Minimal calcified atherosclerosis of the dominant left vertebral artery V4 segment without stenosis. Mild generalized carotid and vertebral artery dolichoectasia. Electronically Signed   By: Genevie Ann M.D.    On: 07/19/2016 12:03     I have independently reviewed the above radiologic studies.  Recent Lab Findings: Lab Results  Component Value Date   WBC 10.3 11/27/2014   HGB 16.2 (H) 11/27/2014   HCT 48.3 (H) 11/27/2014   PLT 230 11/27/2014   GLUCOSE 86 08/16/2015   CHOL 251 (H) 02/09/2016   TRIG 154 (H) 02/09/2016   HDL 73 02/09/2016   LDLDIRECT 173.6 12/17/2011   LDLCALC 147 (H) 02/09/2016   ALT 60 (H) 08/16/2015   AST 39 (H) 08/16/2015   NA 139 08/16/2015   K 4.6 08/16/2015   CL 102 08/16/2015   CREATININE 0.66 08/16/2015   BUN 11 08/16/2015   CO2 25 08/16/2015   TSH 3.00 05/30/2009   INR 0.94 12/25/2013   HGBA1C 5.8 (H) 08/16/2015      Assessment / Plan:   #1 long-standing tobacco use- Now off cigarettes for several months #2 history of myocardial infarction 2005 at age 26-has established a cardiologist #3 evidence on MRI of old left hemispheric stroke #4 patient has multiple bilateral 3-4 mm pulmonary nodules-will need follow-up scan #5 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 Patient is also seen vascular surgery. I will discuss and review the films with Dr. Trula Slade to consider arteriogram and possible stenting of the proximal left carotid artery.   Currently the patient continues on aspirin and Plavix without any further neurologic symptoms.  Grace Isaac MD      [address] [place] [address] Office [phone]   Beeper [phone]  07/19/2016 1:12 PM

## 2016-07-21 ENCOUNTER — Encounter: Payer: Self-pay | Admitting: Family Medicine

## 2016-07-26 ENCOUNTER — Telehealth: Payer: Self-pay | Admitting: Pharmacist

## 2016-07-26 MED ORDER — ALIROCUMAB 75 MG/ML ~~LOC~~ SOPN: 75.0000 mg | PEN_INJECTOR | SUBCUTANEOUS | 11 refills | Status: DC

## 2016-07-26 NOTE — Telephone Encounter (Signed)
Appeal for Praluent was approved (Medication approved until Sept/22/2018).  Patient informed of decision - Rx sent to [facility] ([facility]). Co-pay card available for $0 x 6 months co-pay.   Janet [name] is to call back if problems to afford co-pay. Also instructed to call when 1st shipment received , so we can schedule follow up Lipid panel and LFT 3 months after 1st injection.

## 2016-07-27 ENCOUNTER — Telehealth: Payer: Self-pay | Admitting: *Deleted

## 2016-07-27 NOTE — Telephone Encounter (Signed)
[employer] rep called for dx code for patient's praluent. This was provided, aware to call back if further inquires.

## 2016-08-02 ENCOUNTER — Encounter: Payer: Self-pay | Admitting: Cardiothoracic Surgery

## 2016-08-07 ENCOUNTER — Ambulatory Visit (INDEPENDENT_AMBULATORY_CARE_PROVIDER_SITE_OTHER): Payer: 59 | Admitting: Neurology

## 2016-08-07 ENCOUNTER — Encounter: Payer: Self-pay | Admitting: Neurology

## 2016-08-07 VITALS — BP 144/86 | HR 74 | Ht 68.0 in | Wt 192.5 lb

## 2016-08-07 DIAGNOSIS — I679 Cerebrovascular disease, unspecified: Secondary | ICD-10-CM

## 2016-08-07 DIAGNOSIS — I7771 Dissection of carotid artery: Secondary | ICD-10-CM

## 2016-08-07 DIAGNOSIS — G4440 Drug-induced headache, not elsewhere classified, not intractable: Secondary | ICD-10-CM

## 2016-08-07 DIAGNOSIS — Z8673 Personal history of transient ischemic attack (TIA), and cerebral infarction without residual deficits: Secondary | ICD-10-CM

## 2016-08-07 DIAGNOSIS — I10 Essential (primary) hypertension: Secondary | ICD-10-CM | POA: Diagnosis not present

## 2016-08-07 DIAGNOSIS — G43709 Chronic migraine without aura, not intractable, without status migrainosus: Secondary | ICD-10-CM

## 2016-08-07 DIAGNOSIS — E785 Hyperlipidemia, unspecified: Secondary | ICD-10-CM | POA: Diagnosis not present

## 2016-08-07 MED ORDER — VENLAFAXINE HCL ER 75 MG PO CP24: 75.0000 mg | ORAL_CAPSULE | Freq: Every day | ORAL | 5 refills | Status: DC

## 2016-08-07 NOTE — Patient Instructions (Signed)
1.  For migraine prevention and anxiety, we will start venlafaxine XR 80m daily.  You may contact me in 6 weeks with update. 2.  Continue Plavix, cholesterol control, blood pressure control 3.  Limit use of pain relievers to no more than 2 days out of the week to prevent rebound headache 4.  Follow up in 6 months.

## 2016-08-07 NOTE — Progress Notes (Signed)
NEUROLOGY FOLLOW UP OFFICE NOTE  CHIRSTINE [name] [id number]  HISTORY OF PRESENT ILLNESS: Janet [name] is a 51 year old right-handed woman with CAD, smoker, hypertension, hyperlipidemia, asthma, arthritis, depression, NASH and liver hemangioma and history of migraines who follows up for TIA.   UPDATE: In September, she had an episode of left sided vision loss in both eyes, lasting for about 20 seconds, consistent with a TIA presenting as left homonymous hemianopia.  She denies any other symptoms, such as slurred speech, focal weakness, focal numbness or headache.  LDL from 02/09/16 was 147, so she was switched from Livalo to Lipitor 100m daily.  MRI of brain from 02/17/16 was personally reviewed was negative for acute infarct.  MRA of head showed no large or medium intracranial stenosis or occlusion.  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  It also revealed ectatic ascending aorta.  Repeat CTA of neck from 07/19/16 was stable.  She was referred to cardiothoracic surgery, where arteriogram and possible stenting was considered.  However, since she was asymptomatic and the scan from March was stable compared to November, it was decided to hold off on intervention and monitor.  She also had a cardiac monitor which did not reveal any obvious arrhythmias.   For the past 3 or 4 months, she has had increased migraines.  They start in the back of the neck and are holocephalic, throbbing.  Usually moderate intensity but sometimes severe.  The headache is constant but has a severe headache every 2 to 3 days.  She  has a severe migraine headache associated with nausea, photophobia and phonophobia every 7 to 10 days.  She uses Stadol.  Otherwise, she uses over the counter ibuprofen daily.  She reports stress, so that is a trigger.  Rest and Stadol help.   [id number] had a TIA in March 2014, presenting as left sided numbness of her head and face.  Carotid doppler performed on 08/12/12 showed no hemodynamically significant ICA stenosis.  She was started on ASA afterwards.     In April 2017, she was riding in the car with her husband when suddenly her left hand became numb with weakness and wrist drop.  It lasted just a few seconds but she also had left perioral numbness lasting several hours.  She denied neck pain, facial weakness, or slurred speech.  She followed up with her PCP, who started her on Plavix.  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  2D echo from 08/26/15 showed EF 60-65% with no cardiac source of emboli.  Carotid doppler showed moderate to large amount of atherosclerotic plaque in both ICAs, but without hemodynamically significant stenosis.  Hgb A1c from 08/16/15 was 5.8 and fasting lipid panel showed total cholesterol was 287, TG 185, HDL 63 and LDL 187.  She was started on Livalo 236mevery other day.  Repeat fasting lipid panel from 09/29/15 showed LDL decreased to 125.     In the past, she was unable to tolerate  Aggrenox and statins.   She has prior neck surgery with C3 to C5 fusions, however she denies neck pain or radicular pain down the arms.  She does report numbness in the hands when she goes to sleep, in which she needs to shake it out.   She also has history of ocular migraines.  One involves only the right eye, in which she sees a crescent shape with triangles in her visual field.  The other involves flashing moving lights in both eyes.   There is no associated headache.  She does have history of migraine.  She quit smoking.  PAST MEDICAL HISTORY: Past Medical History:  Diagnosis Date  . Adenoma of right adrenal gland    Benign  . Allergy    SEASONAL  . Anxiety   . Arthritis   . Asthma   . Barrett's esophagus 02/09/2014  . CAD (coronary artery disease)    MI (2005) concern for coronary vasospasm  . Depression   . Elevated LFTs   . Focal nodular hyperplasia of liver   . GERD (gastroesophageal reflux disease)   . Heart murmur    AS A CHILD  . Hepatic steatosis   . History of adenomatous polyp of colon 02/09/2014  . HLD (hyperlipidemia)   . HTN (hypertension)   . Liver hemangioma   . Liver hemangioma   . Migraine headache   . Myocardial infarction   . Nonalcoholic steatohepatitis (NASH) 12/11/2013   sees [facility] at [facility]  . Palpitations   . Stroke [facility])     MEDICATIONS: Current Outpatient Prescriptions on File Prior to Visit  Medication Sig Dispense Refill  . amLODipine (NORVASC) 10 MG tablet TAKE 1 TABLET EVERY DAY 90 tablet 2  . benazepril (LOTENSIN) 20 MG tablet TAKE 1 TABLET BY MOUTH EVERY DAY 90 tablet 0  . butorphanol (STADOL) 10 MG/ML nasal spray USE 1 SPRAY IN ONE NOSTIL ONCE A DAY AS NEEDED ONLY 2.5 mL 1  . cetirizine (ZYRTEC) 10 MG tablet Take 1 tablet (10 mg total) by mouth daily as needed (seasonal allergies). 30 tablet 3  . clopidogrel (PLAVIX) 75 MG tablet Take 1 tablet (75 mg total) by mouth daily. 90 tablet 3  . fluticasone (FLONASE) 50 MCG/ACT nasal spray PLACE 2 SPRAYS INTO BOTH NOSTRILS DAILY AS NEEDED FOR SEASONAL ALLERGIES 16 g 11  . HYDROcodone-acetaminophen (NORCO/VICODIN) 5-325 MG tablet Take 1 tablet by mouth every 6 (six) hours as needed for moderate pain. 30 tablet 0  . ibuprofen (ADVIL,MOTRIN) 200 MG tablet Take 400 mg by mouth 2 (two) times daily as needed (pain).    . pantoprazole (PROTONIX) 40 MG tablet TAKE 1 TABLET BY MOUTH EVERY DAY BEFORE BREAKFAST  30 tablet 0  . Pitavastatin Calcium (LIVALO) 4 MG TABS Take 1 tablet (4 mg total) by mouth daily. 30 tablet 11  . Alirocumab (PRALUENT) 75 MG/ML SOPN Inject 75 mg into the skin every 14 (fourteen) days. (Patient not taking: Reported on 08/07/2016) 2 pen 11  . nitroGLYCERIN (NITROSTAT) 0.4 MG SL tablet Place 0.4 mg under the tongue every 5 (five) minutes as needed for chest pain.      No current facility-administered medications on file prior to visit.     ALLERGIES: Allergies  Allergen Reactions  . Aggrenox [Aspirin-Dipyridamole Er] Tinitus  . Atorvastatin Other (See Comments)    Myalgias   . Codeine Itching    Nose itches  . Imitrex [Sumatriptan Base] Swelling    Throat swelling  . Isosorbide  Nitrate Other (See Comments)    headaches  . Other Other (See Comments)    Artificial sweeteners cause heart palpitations (stevia is ok)  . Rosuvastatin Other (See Comments)    Restless legs  . Simvastatin Other (See Comments)    Myalgias    FAMILY HISTORY: Family History  Problem Relation Age of Onset  . Coronary artery disease Father   . Aneurysm Father     AAA  . Melanoma Father   . Transient ischemic attack Father   . Heart disease Father   . AAA (abdominal aortic aneurysm) Father   . Aneurysm Mother     AAA  . Colon polyps Mother   . Heart disease Mother   . Irritable bowel syndrome Mother   . Diverticulitis Mother   . Other Mother     Evlyn Clines  . AAA (abdominal aortic aneurysm) Mother   . Breast cancer Paternal Aunt   . Liver disease Maternal Uncle   . Heart disease Brother     SOCIAL HISTORY: Social History   Social History  . Marital status: Married    Spouse name: N/A  . Number of children: 1  . Years of education: N/A   Occupational History  . Mechanical designer    Social History Main Topics  . Smoking status: Former Smoker    Packs/day: 0.30    Types: Cigarettes    Quit date: 03/30/2016  . Smokeless tobacco: Never Used  . Alcohol use Yes      Comment: occasional; 1 per day  . Drug use: No  . Sexual activity: Not on file   Other Topics Concern  . Not on file   Social History Narrative  . No narrative on file    REVIEW OF SYSTEMS: Constitutional: No fevers, chills, or sweats, no generalized fatigue, change in appetite Eyes: No visual changes, double vision, eye pain Ear, nose and throat: No hearing loss, ear pain, nasal congestion, sore throat Cardiovascular: No chest pain, palpitations Respiratory:  No shortness of breath at rest or with exertion, wheezes GastrointestinaI: No nausea, vomiting, diarrhea, abdominal pain, fecal incontinence Genitourinary:  No dysuria, urinary retention or frequency Musculoskeletal:  No neck pain, back pain Integumentary: No rash, pruritus, skin lesions Neurological: as above Psychiatric: No depression, insomnia, anxiety Endocrine: No palpitations, fatigue, diaphoresis, mood swings, change in appetite, change in weight, increased thirst Hematologic/Lymphatic:  No purpura, petechiae. Allergic/Immunologic: no itchy/runny eyes, nasal congestion, recent allergic reactions, rashes  PHYSICAL EXAM: Vitals:   08/07/16 1057  BP: (!) 144/86  Pulse: 74   General: No acute distress.  Patient appears well-groomed.   Head:  Normocephalic/atraumatic Eyes:  Fundi examined but not visualized Neck: supple, no paraspinal tenderness, full range of motion Heart:  Regular rate and rhythm Lungs:  Clear to auscultation bilaterally Back: No paraspinal tenderness Neurological Exam: alert and oriented to person, place, and time. Attention span and concentration intact, recent and remote memory intact, fund of knowledge intact.  Speech fluent and not dysarthric, language intact.  CN II-XII intact. Bulk and tone normal, muscle strength 5/5 throughout.  Sensation to light touch  intact.  Deep tendon reflexes 2+ throughout.  Finger to nose testing intact.  Gait normal, Romberg negative.  IMPRESSION: Chronic  migraine complicated by medication overuse Cerebrovascular disease with history of stroke and TIAs Hypertension Hyperlipidemia Carotid artery dissection  PLAN: 1.  ASA and Plavix as for secondary stroke prevention 2.  Livalo for hyperlipidemia as per cardio/PCP (LDL goal less than 70) 3.  Blood  pressure control as per PCP.  Borderline elevated today.  Recheck with PCP. 4.  For migraines and anxiety, will start venlafaxine XR 83m daily. 5.  Advised to limit pain relievers to no more than 2 days per week to prevent rebound headache. 6.  Follow up in 6 months.  AMetta Clines DO  CC: WJenna Luo MD

## 2016-08-10 ENCOUNTER — Other Ambulatory Visit: Payer: Self-pay

## 2016-08-10 MED ORDER — PANTOPRAZOLE SODIUM 40 MG PO TBEC: DELAYED_RELEASE_TABLET | ORAL | 0 refills | Status: DC

## 2016-08-15 ENCOUNTER — Other Ambulatory Visit: Payer: Self-pay | Admitting: Family Medicine

## 2016-08-15 MED ORDER — BENAZEPRIL HCL 20 MG PO TABS: 20.0000 mg | ORAL_TABLET | Freq: Every day | ORAL | 3 refills | Status: DC

## 2016-09-13 ENCOUNTER — Encounter: Payer: Self-pay | Admitting: Cardiothoracic Surgery

## 2016-09-21 ENCOUNTER — Ambulatory Visit: Payer: 59 | Admitting: Student

## 2016-09-30 NOTE — Progress Notes (Signed)
Cardiology Office Note    Date:  10/01/2016   ID:  Janet [name], DOB 1965/08/22, MRN [id number]  PCP:  Susy Frizzle, MD  Cardiologist: Dr. Debara Pickett  Chief Complaint  Patient presents with  . Follow-up visit    recent episode of neck pain    History of Present Illness:    Janet [name] is a 51 y.o. 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   She was evaluated by Dr. Debara Pickett in 04/2016 and reported doing well from a cardiac perspective at that time. With her LDL elevation of 147, she was referred to the Lipid Clinic for initiation of PCSK9 inhibitor. Repatha was denied and she has been started on Praulent after initially being denied but later approved.   In talking with the patient today, she reports overall doing well since her last office visit. She did experience an episode of neck pain radiating to her right ear occurring last week. She describes this as an aching sensation which lasted for the entire day. This was not worse with exertion. She denies any repeat episodes since.  No recent chest discomfort or dyspnea on exertion. No orthopnea, PND, lower extremity edema, or palpitations. She is being followed by Dr. Servando Snare and Dr. Trula Slade in regards to her pseudoaneurysm. She was last evaluated by Dr. Servando Snare in 06/2016 and was mentioned at that time that he was going to discuss her recent CT scans to see if an arteriogram and possible stenting of the left carotid artery would be beneficial. The patient reports there are no plans for procedures at this time.   Past Medical History:  Diagnosis Date  . Adenoma of right adrenal gland    Benign  . Allergy    SEASONAL  . Anxiety   . Arthritis   . Asthma   .  Barrett's esophagus 02/09/2014  . CAD (coronary artery disease)    a. prior MI in 2005 with 60-70% dLAD stenosis and 60-70% D2 stenosis (concern for coronary vasospasm)  . Depression   . Elevated LFTs   . Focal nodular hyperplasia of liver   . GERD (gastroesophageal reflux disease)   . Heart murmur    AS A CHILD  . Hepatic steatosis   . History of adenomatous polyp of colon 02/09/2014  . HLD (hyperlipidemia)   . HTN (hypertension)   . Liver hemangioma   . Liver hemangioma   . Migraine headache   . Myocardial infarction ([place])   . Nonalcoholic steatohepatitis (NASH) 12/11/2013   sees [facility] at [facility]  . Palpitations   . Stroke [facility])     Past Surgical History:  Procedure Laterality Date  . ABDOMINOPLASTY    . BLADDER SUSPENSION    . CERVICAL FUSION     C4, 5, 6  . COLONOSCOPY W/ BIOPSIES    . ESOPHAGOGASTRODUODENOSCOPY    . TONSILLECTOMY    . TUBAL LIGATION      Current Medications: Outpatient Medications Prior to Visit  Medication Sig Dispense Refill  . amLODipine (NORVASC) 10 MG tablet TAKE 1 TABLET EVERY DAY 90 tablet 2  . benazepril (LOTENSIN) 20 MG tablet Take 1 tablet (20 mg total) by mouth daily. 90 tablet 3  . butorphanol (STADOL) 10  MG/ML nasal spray USE 1 SPRAY IN ONE NOSTIL ONCE A DAY AS NEEDED ONLY 2.5 mL 1  . cetirizine (ZYRTEC) 10 MG tablet Take 1 tablet (10 mg total) by mouth daily as needed (seasonal allergies). 30 tablet 3  . clopidogrel (PLAVIX) 75 MG tablet Take 1 tablet (75 mg total) by mouth daily. 90 tablet 3  . fluticasone (FLONASE) 50 MCG/ACT nasal spray PLACE 2 SPRAYS INTO BOTH NOSTRILS DAILY AS NEEDED FOR SEASONAL ALLERGIES 16 g 11  . HYDROcodone-acetaminophen (NORCO/VICODIN) 5-325 MG tablet Take 1 tablet by mouth every 6 (six) hours as needed for moderate pain. 30 tablet 0  . ibuprofen (ADVIL,MOTRIN) 200 MG tablet Take 400 mg by mouth 2 (two) times daily as needed (pain).    . nitroGLYCERIN (NITROSTAT) 0.4 MG SL tablet Place  0.4 mg under the tongue every 5 (five) minutes as needed for chest pain.     . pantoprazole (PROTONIX) 40 MG tablet TAKE 1 TABLET BY MOUTH EVERY DAY BEFORE BREAKFAST 90 tablet 0  . Pitavastatin Calcium (LIVALO) 4 MG TABS Take 1 tablet (4 mg total) by mouth daily. 30 tablet 11  . venlafaxine XR (EFFEXOR-XR) 75 MG 24 hr capsule Take 1 capsule (75 mg total) by mouth daily with breakfast. 30 capsule 5  . Alirocumab (PRALUENT) 75 MG/ML SOPN Inject 75 mg into the skin every 14 (fourteen) days. 2 pen 11   No facility-administered medications prior to visit.      Allergies:   Aggrenox [aspirin-dipyridamole er]; Atorvastatin; Codeine; Imitrex [sumatriptan base]; Isosorbide nitrate; Other; Rosuvastatin; and Simvastatin   Social History   Social History  . Marital status: Married    Spouse name: N/A  . Number of children: 1  . Years of education: N/A   Occupational History  . Mechanical designer    Social History Main Topics  . Smoking status: Former Smoker    Packs/day: 0.30    Types: Cigarettes    Quit date: 03/30/2016  . Smokeless tobacco: Never Used  . Alcohol use Yes     Comment: occasional; 1 per day  . Drug use: No  . Sexual activity: Not Asked   Other Topics Concern  . None   Social History Narrative  . None     Family History:  The patient's family history includes AAA (abdominal aortic aneurysm) in her father and mother; Aneurysm in her father and mother; Breast cancer in her paternal aunt; Colon polyps in her mother; Coronary artery disease in her father; Diverticulitis in her mother; Heart disease in her brother, father, and mother; Irritable bowel syndrome in her mother; Liver disease in her maternal uncle; Melanoma in her father; Other in her mother; Transient ischemic attack in her father.   Review of Systems:   Please see the history of present illness.     General:  No chills, fever, night sweats or weight changes. Positive for neck pain.  Cardiovascular:  No chest  pain, dyspnea on exertion, edema, orthopnea, palpitations, paroxysmal nocturnal dyspnea. Dermatological: No rash, lesions/masses Respiratory: No cough, dyspnea Urologic: No hematuria, dysuria Abdominal:   No nausea, vomiting, diarrhea, bright red blood per rectum, melena, or hematemesis Neurologic:  No visual changes, wkns, changes in mental status.  All other systems reviewed and are otherwise negative except as noted above.   Physical Exam:    VS:  BP 136/84   Pulse 71   Ht 5' 8"  (1.727 m)   Wt 193 lb 12.8 oz (87.9 kg)   BMI 29.47 kg/m  General: Well developed, well nourished Caucasian female appearing in no acute distress. Head: Normocephalic, atraumatic, sclera non-icteric, no xanthomas, nares are without discharge.  Neck: No carotid bruits. JVD not elevated.  Lungs: Respirations regular and unlabored, without wheezes or rales.  Heart: Regular rate and rhythm. No S3 or S4.  No murmur, no rubs, or gallops appreciated. Abdomen: Soft, non-tender, non-distended with normoactive bowel sounds. No hepatomegaly. No rebound/guarding. No obvious abdominal masses. Msk:  Strength and tone appear normal for age. No joint deformities or effusions. Extremities: No clubbing or cyanosis. No lower extremity edema.  Distal pedal pulses are 2+ bilaterally. Neuro: Alert and oriented X 3. Moves all extremities spontaneously. No focal deficits noted. Psych:  Responds to questions appropriately with a normal affect. Skin: No rashes or lesions noted  Wt Readings from Last 3 Encounters:  10/01/16 193 lb 12.8 oz (87.9 kg)  08/07/16 192 lb 8 oz (87.3 kg)  07/19/16 184 lb (83.5 kg)     Studies/Labs Reviewed:   EKG:  EKG is ordered today.  The ekg ordered today demonstrates NSR, HR 71, LAD, and ST depression along the lateral leads (similar to prior tracings).   Recent Labs: No results found for requested labs within last 8760 hours.   Lipid Panel    Component Value Date/Time   CHOL 251 (H)  02/09/2016 0937   TRIG 154 (H) 02/09/2016 0937   HDL 73 02/09/2016 0937   CHOLHDL 3.4 02/09/2016 0937   VLDL 31 (H) 02/09/2016 0937   LDLCALC 147 (H) 02/09/2016 0937   LDLDIRECT 173.6 12/17/2011 0951    Additional studies/ records that were reviewed today include:   Echocardiogram: 04/2016 Study Conclusions  - Left ventricle: The cavity size was normal. Wall thickness was   normal. Systolic function was normal. The estimated ejection   fraction was in the range of 60% to 65%. Doppler parameters are   consistent with abnormal left ventricular relaxation (grade 1   diastolic dysfunction). - Aorta: Borderline aortic root. Aortic root dimension: 37 mm (ED). - Right ventricle: The cavity size was normal. Systolic function   was normal. - Atrial septum: No defect or patent foramen ovale was identified.   Echo contrast study showed no right-to-left atrial level shunt,   at baseline or with provocation. - Tricuspid valve: Peak RV-RA gradient (S): 21 mm Hg. - Pulmonary arteries: PA peak pressure: 24 mm Hg (S). - Inferior vena cava: The vessel was normal in size. The   respirophasic diameter changes were in the normal range (>= 50%),   consistent with normal central venous pressure.  Impressions:  - Limited echo for bubble study.   Assessment:    1. Coronary artery disease involving native coronary artery of native heart without angina pectoris   2. Pseudoaneurysm of aortic arch ([place])   3. Essential hypertension   4. Hyperlipidemia LDL goal <70   5. Transient cerebral ischemia, unspecified type     Plan:   In order of problems listed above:  1. CAD - s/p prior MI in 2005 with 60-70% dLAD stenosis and 60-70% D2 stenosis at that time. - she denies any recent chest pain or dyspnea on exertion.  Did have an episode of neck pain occurring two weeks ago which lasted throughout the day, not worse with exertion. EKG today is without acute ischemic changes. - continue Plavix and  statin therapy.   2. Pseudoaneurysm of aortic arch  - followed by CT Surgery and Vascular Surgery. Prior notes mention possible stenting of  the left carotid artery would be beneficial but the patient reports there are no plans for procedures at this time.  3. HTN - BP is elevated to 142/96 on initial check, at 136/84 on recheck. She reports having not yet taken her Amlodipine or Benazepril today. - continue Amlodipine 60m daily and Benazepril 212mdaily.   4. HLD - Lipid Panel in 01/2016 showed a total cholesterol of 251, HDL 73, and LDL 147. - LDL goal is < 70 with known CAD and prior TIA's. - has been started on Praulent. Being followed by the Lipid Clinic.    5. Recurrent TIA's - followed by Neurology.  - remains on Plavix along with Praulent and statin therapy.   Medication Adjustments/Labs and Tests Ordered: Current medicines are reviewed at length with the patient today.  Concerns regarding medicines are outlined above.  Medication changes, Labs and Tests ordered today are listed in the Patient Instructions below. Patient Instructions  Medication Instructions:  Your physician recommends that you continue on your current medications as directed. Please refer to the Current Medication list given to you today.  If you need a refill on your cardiac medications before your next appointment, please call your pharmacy.  Follow-Up: Your physician wants you to follow-up in: 6 [place] will receive a reminder letter in the mail two months in advance. If you don't receive a letter, please call our office OCTOBER 2019 to schedule the December 2018 follow-up appointment.  Thank you for choosing [facility] at [facility]6/07/2016 3:19 PM    [address]  [id number]: [phone]: [phone] [address] LaneSuRopesvillerMalaga[place] [id number]:  [phone]

## 2016-10-01 ENCOUNTER — Encounter: Payer: Self-pay | Admitting: Student

## 2016-10-01 ENCOUNTER — Ambulatory Visit (INDEPENDENT_AMBULATORY_CARE_PROVIDER_SITE_OTHER): Payer: 59 | Admitting: Student

## 2016-10-01 VITALS — BP 136/84 | HR 71 | Ht 68.0 in | Wt 193.8 lb

## 2016-10-01 DIAGNOSIS — G459 Transient cerebral ischemic attack, unspecified: Secondary | ICD-10-CM

## 2016-10-01 DIAGNOSIS — I712 Thoracic aortic aneurysm, without rupture: Secondary | ICD-10-CM | POA: Diagnosis not present

## 2016-10-01 DIAGNOSIS — E785 Hyperlipidemia, unspecified: Secondary | ICD-10-CM | POA: Diagnosis not present

## 2016-10-01 DIAGNOSIS — I2510 Atherosclerotic heart disease of native coronary artery without angina pectoris: Secondary | ICD-10-CM

## 2016-10-01 DIAGNOSIS — I10 Essential (primary) hypertension: Secondary | ICD-10-CM | POA: Diagnosis not present

## 2016-10-01 DIAGNOSIS — I7122 Aneurysm of the aortic arch, without rupture: Secondary | ICD-10-CM

## 2016-10-01 MED ORDER — ALIROCUMAB 75 MG/ML ~~LOC~~ SOPN: 75.0000 mg | PEN_INJECTOR | SUBCUTANEOUS | 3 refills | Status: DC

## 2016-10-01 NOTE — Patient Instructions (Signed)
Medication Instructions:  Your physician recommends that you continue on your current medications as directed. Please refer to the Current Medication list given to you today.  If you need a refill on your cardiac medications before your next appointment, please call your pharmacy.  Follow-Up: Your physician wants you to follow-up in: [place]. You will receive a reminder letter in the mail two months in advance. If you don't receive a letter, please call our office OCTOBER 2019 to schedule the December 2018 follow-up appointment.  Thank you for choosing [facility] at [facility]!!

## 2016-10-11 ENCOUNTER — Other Ambulatory Visit: Payer: Self-pay | Admitting: Pharmacist

## 2016-10-11 MED ORDER — ALIROCUMAB 75 MG/ML ~~LOC~~ SOPN: 75.0000 mg | PEN_INJECTOR | SUBCUTANEOUS | 3 refills | Status: DC

## 2016-10-25 ENCOUNTER — Ambulatory Visit: Payer: 59 | Admitting: Cardiothoracic Surgery

## 2016-10-25 ENCOUNTER — Telehealth: Payer: Self-pay | Admitting: Pharmacist

## 2016-10-25 DIAGNOSIS — E785 Hyperlipidemia, unspecified: Secondary | ICD-10-CM

## 2016-10-25 NOTE — Telephone Encounter (Signed)
Patient recently added Praluent 50m every 14 days to her livalo therapy. Doing well with medication. No ADRs to report.  Dueto receive 5th injection in August. Will order lipid profile and LFT follow up for 2nd week in August.  Patient instructed to come to clinic to complete blood work.   Insurance pre-authorization renewal due Sept/22/2018

## 2016-10-26 ENCOUNTER — Encounter: Payer: Self-pay | Admitting: Cardiothoracic Surgery

## 2016-10-26 ENCOUNTER — Ambulatory Visit (INDEPENDENT_AMBULATORY_CARE_PROVIDER_SITE_OTHER): Payer: 59 | Admitting: Cardiothoracic Surgery

## 2016-10-26 VITALS — BP 141/93 | HR 74 | Resp 20 | Ht 68.0 in | Wt 195.0 lb

## 2016-10-26 DIAGNOSIS — I7771 Dissection of carotid artery: Secondary | ICD-10-CM

## 2016-10-26 DIAGNOSIS — R918 Other nonspecific abnormal finding of lung field: Secondary | ICD-10-CM | POA: Diagnosis not present

## 2016-10-26 DIAGNOSIS — I719 Aortic aneurysm of unspecified site, without rupture: Secondary | ICD-10-CM | POA: Diagnosis not present

## 2016-10-26 NOTE — Progress Notes (Signed)
[address]       [place] [address]             [phone]                    Janet [name] [place] Medical Record #[id number] Date of Birth: 1966/04/06  Referring: Janet Partridge, DO Primary Care: Janet Frizzle, MD  Chief Complaint:    Chief Complaint  Patient presents with  . Lung Lesion    33monthf/u, last CTA Chest was 07/19/16    History of Present Illness:  Since last seen she has had no further neurologic symptoms, she has been diligent about not smoking and monitoring her blood pressure. She continues on aspirin and Plavix.    Janet [id number].[id number].  female followed in the office  for Abnormal CT scan of the neck done 03/29/2016 and repeated in March 2018. The patient is a 51year old female with known history of vascular disease including having a myocardial infarction in 2005 treated medically. In the spring of 2017 patient noted visual changes in her eyes with a "crescent shaped" visual defect but she could not remember which. Subsequently she had episode of left hand numbness which lasted about 30 seconds and numbness of the left side of her mouth and face. She also describes visual field deficit toward the left visual field transiently in the summer. Since these episodes she has been placed on Plavix, she had been on long-term aspirin. She been intolerant of multiple statins in the past.  Because of these symptoms she was seen by neurology, MRI of the brain and neck was performed was an abnormality in the proximal left carotid origin and a CT scan/CTA of the neck was performed.   The patient also describes increasing fatigue with minimal exertion and shortness of breath, she denies any specific chest discomfort or chest pain.  She had been a long-term smoker. Since our discussion in November 2017 she and her husband have completely stop smoking.     Current Activity/ Functional Status:  Patient is independent with  mobility/ambulation, transfers, ADL's, IADL's.   Zubrod Score: At the time of surgery this patient's most appropriate activity status/level should be described as: []     0    Normal activity, no symptoms [x]     1    Restricted in physical strenuous activity but ambulatory, able to do out light work []     2    Ambulatory and capable of self care, unable to do work activities, up and about               >50 % of waking hours                              []     3    Only limited self care, in bed greater than 50% of waking hours []     4    Completely disabled, no self care, confined to bed or chair []     5    Moribund   Past Medical History:  Diagnosis Date  . Adenoma of right adrenal gland    Benign  . Allergy    SEASONAL  . Anxiety   . Arthritis   . Asthma   . Barrett's esophagus 02/09/2014  . CAD (coronary artery disease)    a. prior MI in 2005 with 60-70% dLAD stenosis  and 60-70% D2 stenosis (concern for coronary vasospasm)  . Depression   . Elevated LFTs   . Focal nodular hyperplasia of liver   . GERD (gastroesophageal reflux disease)   . Heart murmur    AS A CHILD  . Hepatic steatosis   . History of adenomatous polyp of colon 02/09/2014  . HLD (hyperlipidemia)   . HTN (hypertension)   . Liver hemangioma   . Liver hemangioma   . Migraine headache   . Myocardial infarction ([place])   . Nonalcoholic steatohepatitis (NASH) 12/11/2013   sees Janet [name] at [facility]  . Palpitations   . Stroke [facility])     Past Surgical History:  Procedure Laterality Date  . ABDOMINOPLASTY    . BLADDER SUSPENSION    . CERVICAL FUSION     C4, 5, 6  . COLONOSCOPY W/ BIOPSIES    . ESOPHAGOGASTRODUODENOSCOPY    . TONSILLECTOMY    . TUBAL LIGATION      Family History  Problem Relation Age of Onset  . Coronary artery disease Father   . Aneurysm Father        AAA  . Melanoma Father   . Transient ischemic attack Father   . Heart disease Father   . AAA (abdominal aortic  aneurysm) Father   . Aneurysm Mother        AAA  . Colon polyps Mother   . Heart disease Mother   . Irritable bowel syndrome Mother   . Diverticulitis Mother   . Other Mother        Evlyn Clines  . AAA (abdominal aortic aneurysm) Mother   . Breast cancer Paternal Aunt   . Liver disease Maternal Uncle   . Heart disease Brother     Social History   Social History  . Marital status: Married    Spouse name: N/A  . Number of children: 1  . Years of education: N/A   Occupational History  . Mechanical designer    Social History Main Topics  . Smoking status: Former Smoker    Packs/day: 0.30    Types: Cigarettes    Quit date: 03/30/2016  . Smokeless tobacco: Never Used  . Alcohol use Yes     Comment: occasional; 1 per day  . Drug use: No  . Sexual activity: Not on file   Other Topics Concern  . Not on file   Social History Narrative  . No narrative on file    History  Smoking Status  . Former Smoker  . Packs/day: 0.30  . Types: Cigarettes  . Quit date: 03/30/2016  Smokeless Tobacco  . Never Used    History  Alcohol Use  . Yes    Comment: occasional; 1 per day     Allergies  Allergen Reactions  . Aggrenox [Aspirin-Dipyridamole Er] Tinitus  . Atorvastatin Other (See Comments)    Myalgias   . Codeine Itching    Nose itches  . Imitrex [Sumatriptan Base] Swelling    Throat swelling  . Isosorbide Nitrate Other (See Comments)    headaches  . Other Other (See Comments)    Artificial sweeteners cause heart palpitations (stevia is ok)  . Rosuvastatin Other (See Comments)    Restless legs  . Simvastatin Other (See Comments)    Myalgias    Current Outpatient Prescriptions  Medication Sig Dispense Refill  . Alirocumab (PRALUENT) 75 MG/ML SOPN Inject 75 mg into the skin every 14 (fourteen) days. 6 pen 3  . amLODipine (NORVASC) 10  MG tablet TAKE 1 TABLET EVERY DAY 90 tablet 2  . benazepril (LOTENSIN) 20 MG tablet Take 1 tablet (20 mg total) by mouth daily. 90  tablet 3  . butorphanol (STADOL) 10 MG/ML nasal spray USE 1 SPRAY IN ONE NOSTIL ONCE A DAY AS NEEDED ONLY 2.5 mL 1  . cetirizine (ZYRTEC) 10 MG tablet Take 1 tablet (10 mg total) by mouth daily as needed (seasonal allergies). 30 tablet 3  . clopidogrel (PLAVIX) 75 MG tablet Take 1 tablet (75 mg total) by mouth daily. 90 tablet 3  . fluticasone (FLONASE) 50 MCG/ACT nasal spray PLACE 2 SPRAYS INTO BOTH NOSTRILS DAILY AS NEEDED FOR SEASONAL ALLERGIES 16 g 11  . HYDROcodone-acetaminophen (NORCO/VICODIN) 5-325 MG tablet Take 1 tablet by mouth every 6 (six) hours as needed for moderate pain. 30 tablet 0  . ibuprofen (ADVIL,MOTRIN) 200 MG tablet Take 400 mg by mouth 2 (two) times daily as needed (pain).    . nitroGLYCERIN (NITROSTAT) 0.4 MG SL tablet Place 0.4 mg under the tongue every 5 (five) minutes as needed for chest pain.     . pantoprazole (PROTONIX) 40 MG tablet TAKE 1 TABLET BY MOUTH EVERY DAY BEFORE BREAKFAST 90 tablet 0  . Pitavastatin Calcium (LIVALO) 4 MG TABS Take 1 tablet (4 mg total) by mouth daily. 30 tablet 11  . venlafaxine XR (EFFEXOR-XR) 75 MG 24 hr capsule Take 1 capsule (75 mg total) by mouth daily with breakfast. 30 capsule 5   No current facility-administered medications for this visit.       Review of Systems:     Cardiac Review of Systems: Y or N  Chest Pain Aqua.Slicker  ]  Resting SOB [ N  ] Exertional SOB  [Y  ]  Orthopnea [  ]   Pedal Edema [   n]    Palpitations Florencio.Farrier  ] Syncope  Florencio.Farrier  ]   Presyncope [ n  ]  General Review of Systems: [Y] = yes [  ]=no Constitional: recent weight change [n  ];  Wt loss over the last 3 months [   ] anorexia [  ]; fatigue [ y ]; nausea [  ]; night sweats [  ]; fever [  ]; or chills [  ];          Dental: poor dentition[  ]; Last Dentist visit:   Eye : blurred vision [  ]; diplopia [   ]; vision changes [  ];  Amaurosis fugax[  ]; Resp: cough [  ];  wheezing[y  ];  hemoptysis[ n ]; shortness of breath[y  ]; paroxysmal nocturnal dyspnea[  ]; dyspnea on  exertion[ y ]; or orthopnea[  ];  GI:  gallstones[  ], vomiting[  ];  dysphagia[  ]; melena[  ];  hematochezia [  ]; heartburn[  ];   Hx of  Colonoscopy[  ]; GU: kidney stones [  ]; hematuria[  ];   dysuria [  ];  nocturia[  ];  history of     obstruction [  ]; urinary frequency [  ]             Skin: rash, swelling[  ];, hair loss[  ];  peripheral edema[  ];  or itching[  ]; Musculosketetal: myalgias[  ];  joint swelling[  ];  joint erythema[  ];  joint pain[  ];  back pain[  ];  Heme/Lymph: bruising[  ];  bleeding[  ];  anemia[  ];  Neuro: TIA[  Y ];  headaches[  ];  stroke[  t];  vertigo[  n];  seizures[n  ];   paresthesias[ n ];  difficulty walking[ny  ];  Psych:depression[  ]; anxiety[  ];  Endocrine: diabetes[n  ];  thyroid dysfunction[  ];  Immunizations: Flu up to date [  ]; Pneumococcal up to date [  ];  Other:  Physical Exam: BP (!) 141/93 (BP Location: Right Arm, Cuff Size: Normal)   Pulse 74   Resp 20   Ht 5' 8"  (1.727 m)   Wt 195 lb (88.5 kg)   SpO2 98% Comment: RA  BMI 29.65 kg/m  Blood pressure was in the left arm.  PHYSICAL EXAMINATION: Physical Exam  Constitutional: She appears well-developed and well-nourished.  HENT:  Head: Normocephalic and atraumatic.  Mouth/Throat: No oropharyngeal exudate.  Eyes: Left eye exhibits no discharge.  Neck: No JVD present. No tracheal deviation present. No thyromegaly present.  Cardiovascular: Normal rate and regular rhythm.  Exam reveals no gallop and no friction rub.   No murmur heard. Respiratory: No stridor. No respiratory distress. She has no wheezes. She has no rales. She exhibits no tenderness.  GI: She exhibits no distension and no mass. There is no tenderness. There is no rebound and no guarding.  Musculoskeletal: She exhibits no deformity.  Skin: Skin is warm and dry.  Psychiatric: She has a normal mood and affect. Her behavior is normal. Judgment and thought content normal.   Diagnostic Studies & Laboratory data:       Recent Radiology Findings:   Ct Angio Neck W Or Wo Contrast  Result Date: 07/19/2016 CLINICAL DATA:  51 year old female with aortic arch superiorly directed pseudoaneurysm, situated between the brachiocephalic and left CCA origins, and discovered in 2017. Subsequent encounter. EXAM: CT ANGIOGRAPHY NECK TECHNIQUE: Multidetector CT imaging of the neck was performed using the standard protocol during bolus administration of intravenous contrast. Multiplanar CT image reconstructions and MIPs were obtained to evaluate the vascular anatomy. Carotid stenosis measurements (when applicable) are obtained utilizing NASCET criteria, using the distal internal carotid diameter as the denominator. CONTRAST:  100 mL Isovue 370 in conjunction with contrast enhanced imaging of the chest reported separately. COMPARISON:  Neck CTA 03/29/2016. Chest CTA today reported separately. FINDINGS: Skeleton: Previous C5-C6 and C6-C7 ACDF. Fusion hardware and arthrodesis appears intact. No acute osseous abnormality identified. Visualized paranasal sinuses and mastoids are stable and well pneumatized. Upper chest: Chest CTA findings are reported separately today. Other neck: Thyroid, larynx, pharynx, parapharyngeal spaces, retropharyngeal space, sublingual space, submandibular glands and parotid glands are within normal limits. No cervical lymphadenopathy. Negative visualized brain parenchyma. Aortic arch: A saccular 13 x 11 x 17 mm (AP by transverse by CC) superiorly directed outpouching from the midportion of the aortic arch adjacent to or arising from the origin of the left common carotid artery appears stable in size and configuration since November (series 301, image 96 today versus series 603, image 104 in 2017). No surrounding soft tissue inflammation. The otherwise 3 vessel arch configuration remain stable. Ectasia of the ascending aorta appears grossly stable and is detailed on the chest study today. Right carotid system: Stable  brachiocephalic artery with no stenosis. There is soft and calcified plaque at the brachiocephalic artery bifurcation into the right CCA and proximal right subclavian artery which appears stable without stenosis. Negative right CCA proximal to the bifurcation. Soft and calcified plaque at the posterior right ICA origin and bulb is stable without stenosis. Mildly tortuous cervical right ICA. Negative  visible right ICA siphon. Left carotid system: Less than 50 % narrowing at the left CCA origin (with respect to the distal vessel) related to mass effect from the adjacent arch lesion is stable. On axial images today the proximal left CCA and superiorly directed saccular lesion appear more convincingly contained within the same adventitia, see series 4, image 78. Beyond its origin the left CCA is negative until the bifurcation. At the bifurcation there is calcified and soft atherosclerotic plaque affecting the lateral and posterior walls of the right ICA origin and bulb with no stenosis. Tortuous cervical right ICA distal to the bulb is stable. Negative visible right ICA siphon. Vertebral arteries: No proximal right subclavian artery stenosis despite calcified plaque. Normal right vertebral artery origin. No proximal left subclavian artery stenosis despite mild soft and calcified plaque. Normal left vertebral artery origin. The left vertebral artery is dominant, but the right has a relatively normal size. No vertebral artery stenosis to the vertebrobasilar junction, and on the left there is only mild left V4 segment calcified plaque. Both PICA origins are patent. Tortuous but otherwise normal vertebrobasilar junction. Review of the MIP images confirms the above findings IMPRESSION: 1. 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 On axial images today this lesion and the adjacent proximal left CCA  appear more convincingly contained within the same adventitia (series 4, image 78) suggesting it may have begun as a dissection of the left CCA origin. 2. Other Chest CTA findings today are reported separately. 3. Stable mild bilateral ICA origin and bulb atherosclerosis without stenosis. Minimal calcified atherosclerosis of the dominant left vertebral artery V4 segment without stenosis. Mild generalized carotid and vertebral artery dolichoectasia. Electronically Signed   By: Genevie Ann M.D.   On: 07/19/2016 12:03     I have independently reviewed the above radiologic studies.  Recent Lab Findings: Lab Results  Component Value Date   WBC 10.3 11/27/2014   HGB 16.2 (H) 11/27/2014   HCT 48.3 (H) 11/27/2014   PLT 230 11/27/2014   GLUCOSE 86 08/16/2015   CHOL 251 (H) 02/09/2016   TRIG 154 (H) 02/09/2016   HDL 73 02/09/2016   LDLDIRECT 173.6 12/17/2011   LDLCALC 147 (H) 02/09/2016   ALT 60 (H) 08/16/2015   AST 39 (H) 08/16/2015   NA 139 08/16/2015   K 4.6 08/16/2015   CL 102 08/16/2015   CREATININE 0.66 08/16/2015   BUN 11 08/16/2015   CO2 25 08/16/2015   TSH 3.00 05/30/2009   INR 0.94 12/25/2013   HGBA1C 5.8 (H) 08/16/2015      Assessment / Plan:   #1 long-standing tobacco use- Now off cigarettes for several months #2 history of myocardial infarction 2005 at age 22-has established a cardiologist #3 evidence on MRI of old left hemispheric stroke #4 patient has multiple bilateral 3-4 mm pulmonary nodules-will need follow-up scan #5 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 HE'S HAD NO FURTHER SYMPTOMS AS SHE DID AT PRESENTATION INVOLVING THE LEFT VISION AND RIGHT HAND.  WE'LL PLAN ON FOLLOW-UP CTA OF THE CHEST 6 MONTHS AFTER THE PREVIOUS ONE WHICH WILL BE LATE AUGUST EARLY SEPTEMBER OF THIS YEAR. I'll plan to see her back following CTA of the chest Grace Isaac MD      [address] [place] [address] Office [phone]   [place]  10/26/2016 4:33 PM

## 2016-11-13 ENCOUNTER — Encounter: Payer: Self-pay | Admitting: Internal Medicine

## 2016-11-14 ENCOUNTER — Telehealth: Payer: Self-pay | Admitting: Pharmacist

## 2016-11-14 NOTE — Telephone Encounter (Signed)
Patient called to report "insane" itching in her arm potentially due to Praluent. Last dose was 8 days ago, she is taking benadryl and using cortisone OTC cream but still experiencing itching.  Will hold therapy for 1 full cycle (28 days) and re-assess symptoms. Plan to re-challenge with Praluent after 28 days. If patient continues to experience itching/rash; plan to change therapy to Repatha.

## 2016-12-05 DIAGNOSIS — Z124 Encounter for screening for malignant neoplasm of cervix: Secondary | ICD-10-CM | POA: Diagnosis not present

## 2016-12-05 DIAGNOSIS — Z01419 Encounter for gynecological examination (general) (routine) without abnormal findings: Secondary | ICD-10-CM | POA: Diagnosis not present

## 2016-12-05 DIAGNOSIS — Z1231 Encounter for screening mammogram for malignant neoplasm of breast: Secondary | ICD-10-CM | POA: Diagnosis not present

## 2016-12-20 ENCOUNTER — Ambulatory Visit (INDEPENDENT_AMBULATORY_CARE_PROVIDER_SITE_OTHER): Payer: 59 | Admitting: Family Medicine

## 2016-12-20 ENCOUNTER — Encounter: Payer: Self-pay | Admitting: Family Medicine

## 2016-12-20 VITALS — BP 160/100 | HR 80 | Temp 98.7°F | Resp 16 | Ht 68.0 in | Wt 195.0 lb

## 2016-12-20 DIAGNOSIS — L299 Pruritus, unspecified: Secondary | ICD-10-CM

## 2016-12-20 MED ORDER — GABAPENTIN 300 MG PO CAPS: 300.0000 mg | ORAL_CAPSULE | Freq: Three times a day (TID) | ORAL | 3 refills | Status: DC

## 2016-12-20 NOTE — Progress Notes (Signed)
Subjective:    Patient ID: Janet [name], female    DOB: Jan 29, 1966, 51 y.o.   MRN: [id number]  HPI Patient presents to a with 2 months of persistent constant itching on the dorsal surfaces of both forearms. She has a history of cervical radiculopathy and neuropathic pain in the arms in the past. She denies any neck pain or pain in the arms at this time there is diffuse widespread itching from her elbow to her wrist bilaterally. There is no visible rash. There are no papules. It is not spreading around her body. Her husband does not have a similar rash. There is no jaundice. She reports normal urine output. There are no blisters. She denies any fevers or chills or signs of illness. She is tried numerous emollients and creams without benefit. Itching sounds neurogenic in nature Past Medical History:  Diagnosis Date  . Adenoma of right adrenal gland    Benign  . Allergy    SEASONAL  . Anxiety   . Arthritis   . Asthma   . Barrett's esophagus 02/09/2014  . CAD (coronary artery disease)    a. prior MI in 2005 with 60-70% dLAD stenosis and 60-70% D2 stenosis (concern for coronary vasospasm)  . Depression   . Elevated LFTs   . Focal nodular hyperplasia of liver   . GERD (gastroesophageal reflux disease)   . Heart murmur    AS A CHILD  . Hepatic steatosis   . History of adenomatous polyp of colon 02/09/2014  . HLD (hyperlipidemia)   . HTN (hypertension)   . Liver hemangioma   . Liver hemangioma   . Migraine headache   . Myocardial infarction ([place])   . Nonalcoholic steatohepatitis (NASH) 12/11/2013   sees Roosevelt Locks at [facility]  . Palpitations   . Stroke [facility])    Past Surgical History:  Procedure Laterality Date  . ABDOMINOPLASTY    . BLADDER SUSPENSION    . CERVICAL FUSION     C4, 5, 6  . COLONOSCOPY W/ BIOPSIES    . ESOPHAGOGASTRODUODENOSCOPY    . TONSILLECTOMY    . TUBAL LIGATION     Current Outpatient Prescriptions on File Prior to Visit  Medication  Sig Dispense Refill  . Alirocumab (PRALUENT) 75 MG/ML SOPN Inject 75 mg into the skin every 14 (fourteen) days. 6 pen 3  . amLODipine (NORVASC) 10 MG tablet TAKE 1 TABLET EVERY DAY 90 tablet 2  . benazepril (LOTENSIN) 20 MG tablet Take 1 tablet (20 mg total) by mouth daily. 90 tablet 3  . butorphanol (STADOL) 10 MG/ML nasal spray USE 1 SPRAY IN ONE NOSTIL ONCE A DAY AS NEEDED ONLY 2.5 mL 1  . cetirizine (ZYRTEC) 10 MG tablet Take 1 tablet (10 mg total) by mouth daily as needed (seasonal allergies). 30 tablet 3  . clopidogrel (PLAVIX) 75 MG tablet Take 1 tablet (75 mg total) by mouth daily. 90 tablet 3  . fluticasone (FLONASE) 50 MCG/ACT nasal spray PLACE 2 SPRAYS INTO BOTH NOSTRILS DAILY AS NEEDED FOR SEASONAL ALLERGIES 16 g 11  . HYDROcodone-acetaminophen (NORCO/VICODIN) 5-325 MG tablet Take 1 tablet by mouth every 6 (six) hours as needed for moderate pain. 30 tablet 0  . ibuprofen (ADVIL,MOTRIN) 200 MG tablet Take 400 mg by mouth 2 (two) times daily as needed (pain).    . nitroGLYCERIN (NITROSTAT) 0.4 MG SL tablet Place 0.4 mg under the tongue every 5 (five) minutes as needed for chest pain.     . pantoprazole (  PROTONIX) 40 MG tablet TAKE 1 TABLET BY MOUTH EVERY DAY BEFORE BREAKFAST 90 tablet 0  . Pitavastatin Calcium (LIVALO) 4 MG TABS Take 1 tablet (4 mg total) by mouth daily. 30 tablet 11   No current facility-administered medications on file prior to visit.    Allergies  Allergen Reactions  . Aggrenox [Aspirin-Dipyridamole Er] Tinitus  . Atorvastatin Other (See Comments)    Myalgias   . Codeine Itching    Nose itches  . Imitrex [Sumatriptan Base] Swelling    Throat swelling  . Isosorbide Nitrate Other (See Comments)    headaches  . Other Other (See Comments)    Artificial sweeteners cause heart palpitations (stevia is ok)  . Rosuvastatin Other (See Comments)    Restless legs  . Simvastatin Other (See Comments)    Myalgias   Social History   Social History  . Marital  status: Married    Spouse name: N/A  . Number of children: 1  . Years of education: N/A   Occupational History  . Mechanical designer    Social History Main Topics  . Smoking status: Former Smoker    Packs/day: 0.30    Types: Cigarettes    Quit date: 03/30/2016  . Smokeless tobacco: Never Used  . Alcohol use Yes     Comment: occasional; 1 per day  . Drug use: No  . Sexual activity: Not on file   Other Topics Concern  . Not on file   Social History Narrative  . No narrative on file      Review of Systems  All other systems reviewed and are negative.      Objective:   Physical Exam  Cardiovascular: Normal rate, regular rhythm and normal heart sounds.   Pulmonary/Chest: Effort normal and breath sounds normal. No respiratory distress. She has no wheezes. She has no rales.  Neurological: No cranial nerve deficit. She exhibits normal muscle tone. Coordination normal.  Skin: No rash noted. No erythema.  Psychiatric: She has a normal mood and affect. Her behavior is normal. Judgment and thought content normal.  Vitals reviewed.         Assessment & Plan:  Pruritus - Plan: gabapentin (NEURONTIN) 300 MG capsule, CBC with Differential/Platelet, COMPLETE METABOLIC PANEL WITH GFR  Begin by checking a CMP to evaluate renal function as well as liver function test. Also check a CBC to rule out polycythemia vera. 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 Her blood pressure today is extremely high. Unfortunately her father recently died. I've asked the patient check her blood pressure several times over the weekend and notify me of the values on Monday. If levels are consistently this high, we will need to adjust her medication to achieve better blood pressure control

## 2016-12-21 ENCOUNTER — Other Ambulatory Visit: Payer: Self-pay | Admitting: Cardiothoracic Surgery

## 2016-12-21 DIAGNOSIS — I7771 Dissection of carotid artery: Secondary | ICD-10-CM

## 2016-12-21 LAB — CBC WITH DIFFERENTIAL/PLATELET
BASOS PCT: 1 %
Basophils Absolute: 76 cells/uL (ref 0–200)
EOS PCT: 1 %
Eosinophils Absolute: 76 cells/uL (ref 15–500)
HCT: 45 % (ref 35.0–45.0)
Hemoglobin: 15 g/dL (ref 12.0–15.0)
Lymphocytes Relative: 43 %
Lymphs Abs: 3268 cells/uL (ref 850–3900)
MCH: 29.9 pg (ref 27.0–33.0)
MCHC: 33.3 g/dL (ref 32.0–36.0)
MCV: 89.8 fL (ref 80.0–100.0)
MONO ABS: 532 {cells}/uL (ref 200–950)
MPV: 10.2 fL (ref 7.5–12.5)
Monocytes Relative: 7 %
Neutro Abs: 3648 cells/uL (ref 1500–7800)
Neutrophils Relative %: 48 %
PLATELETS: 293 10*3/uL (ref 140–400)
RBC: 5.01 MIL/uL (ref 3.80–5.10)
RDW: 14 % (ref 11.0–15.0)
WBC: 7.6 10*3/uL (ref 3.8–10.8)

## 2016-12-21 LAB — COMPLETE METABOLIC PANEL WITH GFR
ALT: 237 U/L — AB (ref 6–29)
AST: 125 U/L — ABNORMAL HIGH (ref 10–35)
Albumin: 4.5 g/dL (ref 3.6–5.1)
Alkaline Phosphatase: 79 U/L (ref 33–130)
BUN: 13 mg/dL (ref 7–25)
CHLORIDE: 102 mmol/L (ref 98–110)
CO2: 28 mmol/L (ref 20–32)
Calcium: 10 mg/dL (ref 8.6–10.4)
Creat: 0.9 mg/dL (ref 0.50–1.05)
GFR, EST AFRICAN AMERICAN: 84 mL/min (ref >=60)
GFR, EST NON AFRICAN AMERICAN: 73 mL/min (ref >=60)
Glucose, Bld: 116 mg/dL — ABNORMAL HIGH (ref 70–99)
Potassium: 4.1 mmol/L (ref 3.5–5.3)
SODIUM: 139 mmol/L (ref 135–146)
Total Bilirubin: 0.4 mg/dL (ref 0.2–1.2)
Total Protein: 6.9 g/dL (ref 6.1–8.1)

## 2016-12-24 ENCOUNTER — Encounter: Payer: Self-pay | Admitting: Family Medicine

## 2016-12-25 ENCOUNTER — Encounter: Payer: Self-pay | Admitting: Internal Medicine

## 2016-12-25 ENCOUNTER — Other Ambulatory Visit: Payer: Self-pay | Admitting: Family Medicine

## 2016-12-25 DIAGNOSIS — R7989 Other specified abnormal findings of blood chemistry: Secondary | ICD-10-CM

## 2016-12-25 DIAGNOSIS — R945 Abnormal results of liver function studies: Principal | ICD-10-CM

## 2016-12-27 ENCOUNTER — Telehealth: Payer: Self-pay | Admitting: Pharmacist

## 2016-12-27 DIAGNOSIS — E785 Hyperlipidemia, unspecified: Secondary | ICD-10-CM

## 2016-12-27 NOTE — Telephone Encounter (Signed)
2nd order sent to patient to re-assess ;ipid panel after initiation of Praluent therapy.

## 2017-01-07 ENCOUNTER — Encounter: Payer: Self-pay | Admitting: Family Medicine

## 2017-01-09 DIAGNOSIS — E785 Hyperlipidemia, unspecified: Secondary | ICD-10-CM | POA: Diagnosis not present

## 2017-01-09 LAB — LIPID PANEL
CHOL/HDL RATIO: 3.5 ratio (ref 0.0–4.4)
Cholesterol, Total: 253 mg/dL — ABNORMAL HIGH (ref 100–199)
HDL: 72 mg/dL (ref >39)
LDL Calculated: 141 mg/dL — ABNORMAL HIGH (ref 0–99)
Triglycerides: 200 mg/dL — ABNORMAL HIGH (ref 0–149)
VLDL Cholesterol Cal: 40 mg/dL (ref 5–40)

## 2017-01-10 ENCOUNTER — Telehealth: Payer: Self-pay | Admitting: Pharmacist

## 2017-01-10 NOTE — Telephone Encounter (Signed)
Talked to Janet [name] today 9/13 to discuss unchanged LDL level after 3 months after initiating PCSK9i therapy.  Patient is currently following up with PCP (Dr Dennard Schaumann) for elevated LFTs. Livalo 33m was place on HOLD 2 weeks ago.  She is holding Praluent as well. Reports low extremity edema since initiating Praluent and expressed concern because her LFTs were controlled until Praleunt was added to therapy.  Recommendation:  1. Will repeat LFTs in 1 week at Dr PDennard Schaumannoffice.  2. If LFTs back to baseline -  resume livalo 470mdaily\ 3. Re-challenge with PCSK9i . Patient will prefer trial with Repatha [address] to assess response and potential ADR.

## 2017-01-11 ENCOUNTER — Telehealth: Payer: Self-pay | Admitting: *Deleted

## 2017-01-11 NOTE — Telephone Encounter (Signed)
She is correct, the gabapentin can cause some pitting edema. Unfortunately there are no medications for neuropathic itching that do not cause swelling in the legs. She could try hydroxyzine 25 mg every 8 hours as needed for itching however this causes sedation and fatigue. I would recommend sticking with the gabapentin because of believe it's safer than the hydroxyzine if it's working

## 2017-01-11 NOTE — Telephone Encounter (Signed)
Call placed to patient and patient made aware.   States that she will continue on Gabapentin.

## 2017-01-11 NOTE — Telephone Encounter (Signed)
Received call from patient.   Reports that she has been taking Gabapentin for itching. Reports that medication has been effective in resolving the itching, but she has noted BLE Edema. States that she stopped taking it for a day and did not have any edema, but once she resumed medication, the swelling returned.   Requested MD to recommend another treatment for itching.   MD please advise.

## 2017-01-14 NOTE — Telephone Encounter (Signed)
Thanks .. I totally agree with that plan.  Dr. Lemmie Evens

## 2017-01-15 ENCOUNTER — Other Ambulatory Visit: Payer: Self-pay | Admitting: Family Medicine

## 2017-01-15 ENCOUNTER — Other Ambulatory Visit: Payer: Self-pay | Admitting: Internal Medicine

## 2017-01-15 DIAGNOSIS — G459 Transient cerebral ischemic attack, unspecified: Secondary | ICD-10-CM

## 2017-01-16 ENCOUNTER — Other Ambulatory Visit: Payer: 59

## 2017-01-16 DIAGNOSIS — R7989 Other specified abnormal findings of blood chemistry: Secondary | ICD-10-CM

## 2017-01-16 DIAGNOSIS — R945 Abnormal results of liver function studies: Principal | ICD-10-CM

## 2017-01-16 LAB — COMPREHENSIVE METABOLIC PANEL
AG Ratio: 1.8 (calc) (ref 1.0–2.5)
ALKALINE PHOSPHATASE (APISO): 80 U/L (ref 33–130)
ALT: 116 U/L — ABNORMAL HIGH (ref 6–29)
AST: 61 U/L — ABNORMAL HIGH (ref 10–35)
Albumin: 4.2 g/dL (ref 3.6–5.1)
BILIRUBIN TOTAL: 0.4 mg/dL (ref 0.2–1.2)
BUN: 14 mg/dL (ref 7–25)
CHLORIDE: 105 mmol/L (ref 98–110)
CO2: 26 mmol/L (ref 20–32)
Calcium: 9.4 mg/dL (ref 8.6–10.4)
Creat: 0.71 mg/dL (ref 0.50–1.05)
GLOBULIN: 2.4 g/dL (ref 1.9–3.7)
Glucose, Bld: 101 mg/dL — ABNORMAL HIGH (ref 65–99)
POTASSIUM: 4.7 mmol/L (ref 3.5–5.3)
Sodium: 139 mmol/L (ref 135–146)
Total Protein: 6.6 g/dL (ref 6.1–8.1)

## 2017-01-17 ENCOUNTER — Ambulatory Visit
Admission: RE | Admit: 2017-01-17 | Discharge: 2017-01-17 | Disposition: A | Discharge status: Home / Self Care | Payer: 59 | Source: Ambulatory Visit | Attending: Cardiothoracic Surgery | Admitting: Cardiothoracic Surgery

## 2017-01-17 ENCOUNTER — Ambulatory Visit (INDEPENDENT_AMBULATORY_CARE_PROVIDER_SITE_OTHER): Payer: 59 | Admitting: Cardiothoracic Surgery

## 2017-01-17 ENCOUNTER — Encounter: Payer: Self-pay | Admitting: Cardiothoracic Surgery

## 2017-01-17 VITALS — BP 144/103 | HR 70 | Resp 16 | Ht 68.0 in | Wt 195.0 lb

## 2017-01-17 DIAGNOSIS — I719 Aortic aneurysm of unspecified site, without rupture: Secondary | ICD-10-CM | POA: Diagnosis not present

## 2017-01-17 DIAGNOSIS — I7771 Dissection of carotid artery: Secondary | ICD-10-CM

## 2017-01-17 DIAGNOSIS — R918 Other nonspecific abnormal finding of lung field: Secondary | ICD-10-CM

## 2017-01-17 MED ORDER — IOPAMIDOL (ISOVUE-370) INJECTION 76%
75.0000 mL | Freq: Once | INTRAVENOUS | Status: AC | PRN
  Administered 2017-01-17: 75 mL via INTRAVENOUS

## 2017-01-17 NOTE — Progress Notes (Signed)
[address]       [place] [address]             [phone]                    Jamesetta T Cerveny [place] Medical Record #[id number] Date of Birth: [birth year]  Referring: Pieter Partridge, DO Primary Care: Susy Frizzle, MD  Chief Complaint:    Chief Complaint  Patient presents with  . Thoracic Aortic Aneurysm    3 month f/u with CTA Chest    History of Present Illness:  Since last seen she has had no further neurologic symptoms, she has been diligent about not smoking and monitoring her blood pressure. She continues on aspirin and Plavix.    SURABHI GADEA 51 y.o. female followed in the office  for Abnormal CT scan of the neck done 03/29/2016 and repeated in March 2018. The patient is a 51 year old female with known history of vascular disease including having a myocardial infarction in 2005 treated medically. In the spring of 2017 patient noted visual changes in her eyes with a "crescent shaped" visual defect but she could not remember which. Subsequently she had episode of left hand numbness which lasted about 30 seconds and numbness of the left side of her mouth and face. She also describes visual field deficit toward the left visual field transiently in the summer. Since these episodes she has been placed on Plavix, she had been on long-term aspirin. She been intolerant of multiple statins in the past.  Because of these symptoms she was seen by neurology, MRI of the brain and neck was performed was an abnormality in the proximal left carotid origin and a CT scan/CTA of the neck was performed.   She had been a long-term smoker. Since our discussion in November 2017 she and her husband have completely stop smoking. She remains nonsmoking.  She notes that today her liver enzymes were noted to be elevated and she was instructed by cardiology to stop her cholesterol medication.  She's had no further TIA type symptoms. However she does have chest  discomfort with exertion and increasing shortness of breath climbing stairs.     Current Activity/ Functional Status:  Patient is independent with mobility/ambulation, transfers, ADL's, IADL's.   Zubrod Score: At the time of surgery this patient's most appropriate activity status/level should be described as: []     0    Normal activity, no symptoms [x]     1    Restricted in physical strenuous activity but ambulatory, able to do out light work []     2    Ambulatory and capable of self care, unable to do work activities, up and about               >50 % of waking hours                              []     3    Only limited self care, in bed greater than 50% of waking hours []     4    Completely disabled, no self care, confined to bed or chair []     5    Moribund   Past Medical History:  Diagnosis Date  . Adenoma of right adrenal gland    Benign  . Allergy    SEASONAL  . Anxiety   . Arthritis   .  Asthma   . Barrett's esophagus 02/09/2014  . CAD (coronary artery disease)    a. prior MI in 2005 with 60-70% dLAD stenosis and 60-70% D2 stenosis (concern for coronary vasospasm)  . Depression   . Elevated LFTs   . Focal nodular hyperplasia of liver   . GERD (gastroesophageal reflux disease)   . Heart murmur    AS A CHILD  . Hepatic steatosis   . History of adenomatous polyp of colon 02/09/2014  . HLD (hyperlipidemia)   . HTN (hypertension)   . Liver hemangioma   . Liver hemangioma   . Migraine headache   . Myocardial infarction ([place])   . Nonalcoholic steatohepatitis (NASH) 12/11/2013   sees [facility] at [facility]  . Palpitations   . Stroke [facility])     Past Surgical History:  Procedure Laterality Date  . ABDOMINOPLASTY    . BLADDER SUSPENSION    . CERVICAL FUSION     C4, 5, 6  . COLONOSCOPY W/ BIOPSIES    . ESOPHAGOGASTRODUODENOSCOPY    . TONSILLECTOMY    . TUBAL LIGATION      Family History  Problem Relation Age of Onset  . Coronary artery disease  Father   . Aneurysm Father        AAA  . Melanoma Father   . Transient ischemic attack Father   . Heart disease Father   . AAA (abdominal aortic aneurysm) Father   . Aneurysm Mother        AAA  . Colon polyps Mother   . Heart disease Mother   . Irritable bowel syndrome Mother   . Diverticulitis Mother   . Other Mother        Evlyn Clines  . AAA (abdominal aortic aneurysm) Mother   . Breast cancer Paternal Aunt   . Liver disease Maternal Uncle   . Heart disease Brother    Both of the patient's parents had abdominal aortic aneurysm repaired in the past   Social History   Social History  . Marital status: Married    Spouse name: N/A  . Number of children: 1  . Years of education: N/A   Occupational History  . Mechanical designer    Social History Main Topics  . Smoking status: Former Smoker    Packs/day: 0.30    Types: Cigarettes    Quit date: 03/30/2016  . Smokeless tobacco: Never Used  . Alcohol use Yes     Comment: occasional; 1 per day  . Drug use: No  . Sexual activity: Not on file   Other Topics Concern  . Not on file   Social History Narrative  . No narrative on file    History  Smoking Status  . Former Smoker  . Packs/day: 0.30  . Types: Cigarettes  . Quit date: 03/30/2016  Smokeless Tobacco  . Never Used    History  Alcohol Use  . Yes    Comment: occasional; 1 per day     Allergies  Allergen Reactions  . Aggrenox [Aspirin-Dipyridamole Er] Tinitus  . Atorvastatin Other (See Comments)    Myalgias   . Codeine Itching    Nose itches  . Imitrex [Sumatriptan Base] Swelling    Throat swelling  . Isosorbide Nitrate Other (See Comments)    headaches  . Other Other (See Comments)    Artificial sweeteners cause heart palpitations (stevia is ok)  . Rosuvastatin Other (See Comments)    Restless legs  . Simvastatin Other (See Comments)  Myalgias    Current Outpatient Prescriptions  Medication Sig Dispense Refill  . amLODipine (NORVASC)  10 MG tablet TAKE 1 TABLET EVERY DAY 90 tablet 2  . benazepril (LOTENSIN) 20 MG tablet Take 1 tablet (20 mg total) by mouth daily. 90 tablet 3  . butorphanol (STADOL) 10 MG/ML nasal spray USE 1 SPRAY IN ONE NOSTIL ONCE A DAY AS NEEDED ONLY 2.5 mL 1  . cetirizine (ZYRTEC) 10 MG tablet Take 1 tablet (10 mg total) by mouth daily as needed (seasonal allergies). 30 tablet 3  . clopidogrel (PLAVIX) 75 MG tablet TAKE 1 TABLET (75 MG TOTAL) BY MOUTH DAILY. 90 tablet 2  . fluticasone (FLONASE) 50 MCG/ACT nasal spray PLACE 2 SPRAYS INTO BOTH NOSTRILS DAILY AS NEEDED FOR SEASONAL ALLERGIES 16 g 11  . gabapentin (NEURONTIN) 300 MG capsule Take 1 capsule (300 mg total) by mouth 3 (three) times daily. 90 capsule 3  . HYDROcodone-acetaminophen (NORCO/VICODIN) 5-325 MG tablet Take 1 tablet by mouth every 6 (six) hours as needed for moderate pain. 30 tablet 0  . ibuprofen (ADVIL,MOTRIN) 200 MG tablet Take 400 mg by mouth 2 (two) times daily as needed (pain).    . nitroGLYCERIN (NITROSTAT) 0.4 MG SL tablet Place 0.4 mg under the tongue every 5 (five) minutes as needed for chest pain.     . pantoprazole (PROTONIX) 40 MG tablet TAKE 1 TABLET BY MOUTH EVERY DAY BEFORE BREAKFAST 90 tablet 0   No current facility-administered medications for this visit.       Review of Systems:     Cardiac Review of Systems: Y or N  Chest Pain Aqua.Slicker  ]  Resting SOB [ N  ] Exertional SOB  [Y  ]  Orthopnea [  ]   Pedal Edema [   n]    Palpitations Florencio.Farrier  ] Syncope  Florencio.Farrier  ]   Presyncope [ n  ]  General Review of Systems: [Y] = yes [  ]=no Constitional: recent weight change [n  ];  Wt loss over the last 3 months [   ] anorexia [  ]; fatigue [ y ]; nausea [  ]; night sweats [  ]; fever [  ]; or chills [  ];          Dental: poor dentition[  ]; Last Dentist visit:   Eye : blurred vision [  ]; diplopia [   ]; vision changes [  ];  Amaurosis fugax[  ]; Resp: cough [  ];  wheezing[y  ];  hemoptysis[ n ]; shortness of breath[y  ]; paroxysmal  nocturnal dyspnea[  ]; dyspnea on exertion[ y ]; or orthopnea[  ];  GI:  gallstones[  ], vomiting[  ];  dysphagia[  ]; melena[  ];  hematochezia [  ]; heartburn[  ];   Hx of  Colonoscopy[  ]; GU: kidney stones [  ]; hematuria[  ];   dysuria [  ];  nocturia[  ];  history of     obstruction [  ]; urinary frequency [  ]             Skin: rash, swelling[  ];, hair loss[  ];  peripheral edema[  ];  or itching[  ]; Musculosketetal: myalgias[  ];  joint swelling[  ];  joint erythema[  ];  joint pain[  ];  back pain[  ];  Heme/Lymph: bruising[  ];  bleeding[  ];  anemia[  ];  Neuro: TIA[ Y ];  headaches[  ];  stroke[  t];  vertigo[  n];  seizures[n  ];   paresthesias[ n ];  difficulty walking[ny  ];  Psych:depression[  ]; anxiety[  ];  Endocrine: diabetes[n  ];  thyroid dysfunction[  ];  Immunizations: Flu up to date [  ]; Pneumococcal up to date [  ];  Other:  Physical Exam: BP (!) 144/103 (BP Location: Right Arm, Patient Position: Sitting, Cuff Size: Large)   Pulse 70   Resp 16   Ht 5' 8"  (1.727 m)   Wt 195 lb (88.5 kg)   SpO2 (!) 70% Comment: RA  BMI 29.65 kg/m  Blood pressure was in the left arm.  PHYSICAL EXAMINATION: Physical Exam  Constitutional: She is oriented to person, place, and time. She appears well-developed and well-nourished.  HENT:  Head: Normocephalic and atraumatic.  Mouth/Throat: No oropharyngeal exudate.  Eyes: No scleral icterus.  Neck: No JVD present. No tracheal deviation present. No thyromegaly present.  Cardiovascular: Normal rate and regular rhythm.  Exam reveals no gallop and no friction rub.   No murmur heard. Respiratory: No stridor. No respiratory distress. She has no wheezes. She has no rales. She exhibits no tenderness.  GI: Soft. Bowel sounds are normal. She exhibits no distension and no mass. There is no tenderness. There is no rebound and no guarding.  Musculoskeletal: She exhibits no deformity.  Lymphadenopathy:    She has no cervical adenopathy.    Neurological: She is alert and oriented to person, place, and time.  Skin: Skin is warm and dry.  Psychiatric: She has a normal mood and affect. Her behavior is normal. Judgment and thought content normal.   Diagnostic Studies & Laboratory data:     Recent Radiology Findings: Ct Angio Chest Aorta W/cm &/or Wo/cm  Result Date: 01/17/2017 CLINICAL DATA:  Carotid artery dissection EXAM: CT ANGIOGRAPHY CHEST WITH CONTRAST TECHNIQUE: Multidetector CT imaging of the chest was performed using the standard protocol during bolus administration of intravenous contrast. Multiplanar CT image reconstructions and MIPs were obtained to evaluate the vascular anatomy. CONTRAST:  75 cc Isovue 370 COMPARISON:  07/17/2016 chest.  MR abdomen 08/14/2015 FINDINGS: Cardiovascular: Maximal diameter of the ascending aorta is 4.1 cm. There is no evidence of aortic dissection or intramural hematoma. Mild calcified plaque in the aortic arch and descending thoracic aorta. The focal dissection at the base of the left common carotid artery is stable. Both lumens opacified. Maximal diameter is 1.6 cm. This is stable based on my direct measurements on the prior study. There is only minimal narrowing of the true lumen. No new dissection. No evidence of extension of the dissection. Mild LAD territory coronary artery calcification. Mediastinum/Nodes: No evidence of abnormal mediastinal adenopathy. No pericardial effusion. Esophagus is unremarkable. Lungs/Pleura: Minimal dependent subsegmental atelectasis. No pneumothorax or pleural effusion. No lung mass. Upper Abdomen: Stable right lobe liver lesion. Focal enhancement in the lateral segment of the left lobe is likely a vascular phenomenon. Stable right adrenal nodule measuring 1.3 cm. Musculoskeletal: Stable L1 compression fracture. Review of the MIP images confirms the above findings. 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 There is only mild  narrowing of the true lumen. Stable aneurysmal dilatation of the ascending aorta at 4.1 cm. Recommend annual imaging followup by CTA or MRA. This recommendation follows 2010 ACCF/AHA/AATS/ACR/ASA/SCA/SCAI/SIR/STS/SVM Guidelines for the Diagnosis and Management of Patients with Thoracic Aortic Disease. Circulation. 2010; 121: D638-V564 Chronic findings as described. Aortic Atherosclerosis (ICD10-I70.0). Electronically Signed   By: Arnell Sieving  Hoss M.D.   On: 01/17/2017 12:48    Ct Angio Neck W Or Wo Contrast  Result Date: 07/19/2016 CLINICAL DATA:  51 year old female with aortic arch superiorly directed pseudoaneurysm, situated between the brachiocephalic and left CCA origins, and discovered in 2017. Subsequent encounter. EXAM: CT ANGIOGRAPHY NECK TECHNIQUE: Multidetector CT imaging of the neck was performed using the standard protocol during bolus administration of intravenous contrast. Multiplanar CT image reconstructions and MIPs were obtained to evaluate the vascular anatomy. Carotid stenosis measurements (when applicable) are obtained utilizing NASCET criteria, using the distal internal carotid diameter as the denominator. CONTRAST:  100 mL Isovue 370 in conjunction with contrast enhanced imaging of the chest reported separately. COMPARISON:  Neck CTA 03/29/2016. Chest CTA today reported separately. FINDINGS: Skeleton: Previous C5-C6 and C6-C7 ACDF. Fusion hardware and arthrodesis appears intact. No acute osseous abnormality identified. Visualized paranasal sinuses and mastoids are stable and well pneumatized. Upper chest: Chest CTA findings are reported separately today. Other neck: Thyroid, larynx, pharynx, parapharyngeal spaces, retropharyngeal space, sublingual space, submandibular glands and parotid glands are within normal limits. No cervical lymphadenopathy. Negative visualized brain parenchyma. Aortic arch: A saccular 13 x 11 x 17 mm (AP by transverse by CC) superiorly directed outpouching from the  midportion of the aortic arch adjacent to or arising from the origin of the left common carotid artery appears stable in size and configuration since November (series 301, image 96 today versus series 603, image 104 in 2017). No surrounding soft tissue inflammation. The otherwise 3 vessel arch configuration remain stable. Ectasia of the ascending aorta appears grossly stable and is detailed on the chest study today. Right carotid system: Stable brachiocephalic artery with no stenosis. There is soft and calcified plaque at the brachiocephalic artery bifurcation into the right CCA and proximal right subclavian artery which appears stable without stenosis. Negative right CCA proximal to the bifurcation. Soft and calcified plaque at the posterior right ICA origin and bulb is stable without stenosis. Mildly tortuous cervical right ICA. Negative visible right ICA siphon. Left carotid system: Less than 50 % narrowing at the left CCA origin (with respect to the distal vessel) related to mass effect from the adjacent arch lesion is stable. On axial images today the proximal left CCA and superiorly directed saccular lesion appear more convincingly contained within the same adventitia, see series 4, image 78. Beyond its origin the left CCA is negative until the bifurcation. At the bifurcation there is calcified and soft atherosclerotic plaque affecting the lateral and posterior walls of the right ICA origin and bulb with no stenosis. Tortuous cervical right ICA distal to the bulb is stable. Negative visible right ICA siphon. Vertebral arteries: No proximal right subclavian artery stenosis despite calcified plaque. Normal right vertebral artery origin. No proximal left subclavian artery stenosis despite mild soft and calcified plaque. Normal left vertebral artery origin. The left vertebral artery is dominant, but the right has a relatively normal size. No vertebral artery stenosis to the vertebrobasilar junction, and on the  left there is only mild left V4 segment calcified plaque. Both PICA origins are patent. Tortuous but otherwise normal vertebrobasilar junction. Review of the MIP images confirms the above findings IMPRESSION: 1. 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 On axial images today this lesion and the adjacent proximal left CCA appear more convincingly contained within the same adventitia (series 4, image 78) suggesting it may have  begun as a dissection of the left CCA origin. 2. Other Chest CTA findings today are reported separately. 3. Stable mild bilateral ICA origin and bulb atherosclerosis without stenosis. Minimal calcified atherosclerosis of the dominant left vertebral artery V4 segment without stenosis. Mild generalized carotid and vertebral artery dolichoectasia. Electronically Signed   By: Genevie Ann M.D.   On: 07/19/2016 12:03     I have independently reviewed the above radiologic studies.  Recent Lab Findings: Lab Results  Component Value Date   WBC 7.6 12/20/2016   HGB 15.0 12/20/2016   HCT 45.0 12/20/2016   PLT 293 12/20/2016   GLUCOSE 101 (H) 01/16/2017   CHOL 253 (H) 01/09/2017   TRIG 200 (H) 01/09/2017   HDL 72 01/09/2017   LDLDIRECT 173.6 12/17/2011   LDLCALC 141 (H) 01/09/2017   ALT 116 (H) 01/16/2017   AST 61 (H) 01/16/2017   NA 139 01/16/2017   K 4.7 01/16/2017   CL 105 01/16/2017   CREATININE 0.71 01/16/2017   BUN 14 01/16/2017   CO2 26 01/16/2017   TSH 3.00 05/30/2009   INR 0.94 12/25/2013   HGBA1C 5.8 (H) 08/16/2015   Aortic Size Index=     4.1     /Body surface area is 2.06 meters squared. 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 Recent studies have raised concern that fluoroquinolone antibiotics could be associated with an increased risk of aortic aneurysm Fluoroquinolones have non-antimicrobial properties that might jeopardise the integrity of the extracellular matrix of the vascular wall In a  propensity score matched cohort study in [place], there was a 66% increased rate of aortic aneurysm or dissection associated with oral fluoroquinolone use, compared with amoxicillin use, within a 60 day risk period from start of treatment  We'll plan to see the patient back in one year with a follow-up CTA of the chest, because of her exertionally related shortness of breath and chest discomfort she will follow-up with Dr. Debara Pickett. She has had a previous stress test done in 2011    Grace Isaac MD      [address] [place] [address] Office [phone]   Beeper [phone]  01/17/2017 1:12 PM

## 2017-01-17 NOTE — Patient Instructions (Signed)

## 2017-01-21 ENCOUNTER — Encounter: Payer: Self-pay | Admitting: Family Medicine

## 2017-02-06 ENCOUNTER — Encounter: Payer: Self-pay | Admitting: Neurology

## 2017-02-06 ENCOUNTER — Ambulatory Visit (INDEPENDENT_AMBULATORY_CARE_PROVIDER_SITE_OTHER): Payer: 59 | Admitting: Neurology

## 2017-02-06 VITALS — BP 124/86 | HR 75 | Ht 68.0 in | Wt 198.6 lb

## 2017-02-06 DIAGNOSIS — G43009 Migraine without aura, not intractable, without status migrainosus: Secondary | ICD-10-CM

## 2017-02-06 DIAGNOSIS — I10 Essential (primary) hypertension: Secondary | ICD-10-CM | POA: Diagnosis not present

## 2017-02-06 DIAGNOSIS — Z8673 Personal history of transient ischemic attack (TIA), and cerebral infarction without residual deficits: Secondary | ICD-10-CM

## 2017-02-06 DIAGNOSIS — I679 Cerebrovascular disease, unspecified: Secondary | ICD-10-CM

## 2017-02-06 DIAGNOSIS — E785 Hyperlipidemia, unspecified: Secondary | ICD-10-CM | POA: Diagnosis not present

## 2017-02-06 NOTE — Progress Notes (Signed)
NEUROLOGY FOLLOW UP OFFICE NOTE  Janet [name] [id number]  HISTORY OF PRESENT ILLNESS: Janet [name] is a 51 year old right-handed woman with CAD, smoker, hypertension, hyperlipidemia, asthma, arthritis, depression, NASH and liver hemangioma and history of migraines who follows up for TIA and migraines.   RECURRENT TIA: Update: She is doing well.  No TIA events since last visit.  It has been difficult to find a cholesterol-lowering drug for her.  She tried Praluent which caused elevation of her liver enzymes, so it had to be discontinued. She is not on any statins at the moment.  LDL from 01/09/17 was 141.  History: She had a TIA in March 2014, presenting as left sided numbness of her head and face.  Carotid doppler performed on 08/12/12 showed no hemodynamically significant ICA stenosis.  She was started on ASA afterwards.     In April 2017, she was riding in the car with her husband when suddenly her left hand became numb with weakness and wrist drop.  It lasted just a few seconds but she also had left perioral numbness lasting several hours.  She denied neck pain, facial weakness, or slurred speech.  She followed up with her PCP, who started her on Plavix.  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  2D echo from 08/26/15 showed EF 60-65% with no cardiac source of emboli.  Carotid doppler showed moderate to large amount of atherosclerotic plaque in both ICAs, but without hemodynamically significant stenosis.  Hgb A1c from 08/16/15 was 5.8 and fasting lipid panel showed total cholesterol was 287, TG 185, HDL 63 and LDL 187.  She was started on Livalo 51m every other day.  Repeat fasting lipid panel from 09/29/15 showed LDL decreased to 125.    In September 2017, she had an episode of left sided vision loss in both eyes, lasting for about 20  seconds, consistent with a TIA presenting as left homonymous hemianopia.  She denies any other symptoms, such as slurred speech, focal weakness, focal numbness or headache.  LDL from 02/09/16 was 147, so she was switched from Livalo to Lipitor 831mdaily.  MRI of brain from 02/17/16 was personally reviewed was negative for acute infarct.  MRA of head showed no large or medium intracranial stenosis or occlusion.  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  It also revealed ectatic ascending aorta.  Repeat CTA of neck from 07/19/16 was stable.  She was referred to cardiothoracic surgery, where arteriogram and possible stenting was considered.  However, since she was asymptomatic and the scan from March was stable compared to November, it was decided to hold off on intervention and monitor.  She also had a cardiac monitor which did not reveal any obvious arrhythmias.    In the past, she was unable to tolerate Aggrenox and statins.   MIGRAINES: Update: For migraines, she was started on venlafaxine XR 7572maily.  Migraines resolved, so she discontinued it 2 months ago.  History: Over the past year,  she has had increased migraines.  They start in the back of the neck and are holocephalic, throbbing.  Usually moderate intensity but sometimes severe.  The headache is constant but has a severe headache every 2 to 3 days.  She has a severe migraine headache associated with nausea, photophobia and phonophobia every 7 to 10 days.  She uses Stadol.  Otherwise, she uses over the counter ibuprofen daily.  She reports stress, so  that is a trigger.  Rest and Stadol help. She also has history of ocular migraines.  One involves only the right eye, in which she sees a crescent shape with triangles in her visual field.  The other involves flashing moving lights in both eyes.  There is no associated headache.  She does have history of migraine.   She has prior neck surgery with C3 to C5 fusions, however she denies neck pain or radicular pain down the arms.  She does report numbness in the hands when she goes to sleep, in which she needs to shake it out.  PAST MEDICAL HISTORY: Past Medical History:  Diagnosis Date  . Adenoma of right adrenal gland    Benign  . Allergy    SEASONAL  . Anxiety   . Arthritis   . Asthma   . Barrett's esophagus 02/09/2014  . CAD (coronary artery disease)    a. prior MI in 2005 with 60-70% dLAD stenosis and 60-70% D2 stenosis (concern for coronary vasospasm)  . Depression   . Elevated LFTs   . Focal nodular hyperplasia of liver   . GERD (gastroesophageal reflux disease)   . Heart murmur    AS A CHILD  . Hepatic steatosis   . History of adenomatous polyp of colon 02/09/2014  . HLD (hyperlipidemia)   . HTN (hypertension)   . Liver hemangioma   . Liver hemangioma   . Migraine headache   . Myocardial infarction ([place])   . Nonalcoholic steatohepatitis (NASH) 12/11/2013   sees Roosevelt Locks at [facility]  . Palpitations   . Stroke [facility])     MEDICATIONS: Current Outpatient Prescriptions on File Prior to Visit  Medication Sig Dispense Refill  . amLODipine (NORVASC) 10 MG tablet TAKE 1 TABLET EVERY DAY 90 tablet 2  . benazepril (LOTENSIN) 20 MG tablet Take 1 tablet (20 mg total) by mouth daily. 90 tablet 3  . butorphanol (STADOL) 10 MG/ML nasal spray USE 1 SPRAY IN ONE NOSTIL ONCE A DAY AS NEEDED ONLY 2.5 mL 1  . cetirizine (ZYRTEC) 10 MG tablet Take 1 tablet (10 mg total) by mouth daily as needed (seasonal allergies). 30 tablet 3  . clopidogrel (PLAVIX) 75 MG tablet TAKE 1  TABLET (75 MG TOTAL) BY MOUTH DAILY. 90 tablet 2  . fluticasone (FLONASE) 50 MCG/ACT nasal spray PLACE 2 SPRAYS INTO BOTH NOSTRILS DAILY AS NEEDED FOR SEASONAL ALLERGIES 16 g 11  . gabapentin (NEURONTIN) 300 MG capsule Take 1 capsule (300 mg total) by mouth 3 (three) times daily. 90 capsule 3  . HYDROcodone-acetaminophen (NORCO/VICODIN) 5-325 MG tablet Take 1 tablet by mouth every 6 (six) hours as needed for moderate pain. 30 tablet 0  . ibuprofen (ADVIL,MOTRIN) 200 MG tablet Take 400 mg by mouth 2 (two) times daily as needed (pain).    . nitroGLYCERIN (NITROSTAT) 0.4 MG SL tablet Place 0.4 mg under the tongue every 5 (five) minutes as needed for chest pain.     . pantoprazole (PROTONIX) 40 MG tablet TAKE 1 TABLET BY MOUTH EVERY  DAY BEFORE BREAKFAST 90 tablet 0   No current facility-administered medications on file prior to visit.     ALLERGIES: Allergies  Allergen Reactions  . Aggrenox [Aspirin-Dipyridamole Er] Tinitus  . Atorvastatin Other (See Comments)    Myalgias   . Codeine Itching    Nose itches  . Imitrex [Sumatriptan Base] Swelling    Throat swelling  . Isosorbide Nitrate Other (See Comments)    headaches  . Other Other (See Comments)    Artificial sweeteners cause heart palpitations (stevia is ok)  . Rosuvastatin Other (See Comments)    Restless legs  . Simvastatin Other (See Comments)    Myalgias    FAMILY HISTORY: Family History  Problem Relation Age of Onset  . Coronary artery disease Father   . Aneurysm Father        AAA  . Melanoma Father   . Transient ischemic attack Father   . Heart disease Father   . AAA (abdominal aortic aneurysm) Father   . Aneurysm Mother        AAA  . Colon polyps Mother   . Heart disease Mother   . Irritable bowel syndrome Mother   . Diverticulitis Mother   . Other Mother        Evlyn Clines  . AAA (abdominal aortic aneurysm) Mother   . Breast cancer Paternal Aunt   . Liver disease Maternal Uncle   . Heart disease Brother      SOCIAL HISTORY: Social History   Social History  . Marital status: Married    Spouse name: N/A  . Number of children: 1  . Years of education: N/A   Occupational History  . Mechanical designer    Social History Main Topics  . Smoking status: Former Smoker    Packs/day: 0.30    Types: Cigarettes    Quit date: 03/30/2016  . Smokeless tobacco: Never Used  . Alcohol use Yes     Comment: occasional; 1 per day  . Drug use: No  . Sexual activity: Not on file   Other Topics Concern  . Not on file   Social History Narrative  . No narrative on file    REVIEW OF SYSTEMS: Constitutional: No fevers, chills, or sweats, no generalized fatigue, change in appetite Eyes: No visual changes, double vision, eye pain Ear, nose and throat: No hearing loss, ear pain, nasal congestion, sore throat Cardiovascular: No chest pain, palpitations Respiratory:  No shortness of breath at rest or with exertion, wheezes GastrointestinaI: No nausea, vomiting, diarrhea, abdominal pain, fecal incontinence Genitourinary:  No dysuria, urinary retention or frequency Musculoskeletal:  No neck pain, back pain Integumentary: No rash, pruritus, skin lesions Neurological: as above Psychiatric: No depression, insomnia, anxiety Endocrine: No palpitations, fatigue, diaphoresis, mood swings, change in appetite, change in weight, increased thirst Hematologic/Lymphatic:  No purpura, petechiae. Allergic/Immunologic: no itchy/runny eyes, nasal congestion, recent allergic reactions, rashes  PHYSICAL EXAM: Vitals:   02/06/17 1107  BP: 124/86  Pulse: 75  SpO2: 98%   General: No acute distress.  Patient appears well-groomed.  Head:  Normocephalic/atraumatic Eyes:  Fundi examined but not visualized Neck: supple, no paraspinal tenderness, full range of motion Heart:  Regular rate and rhythm Lungs:  Clear to auscultation bilaterally Back: No paraspinal tenderness Neurological Exam: Neurological Exam: alert and  oriented to person, place, and time. Attention span and concentration intact, recent and remote memory intact, fund of knowledge intact.  Speech fluent and not dysarthric, language intact.  CN II-XII intact. Bulk and tone  normal, muscle strength 5/5 throughout.  Sensation to light touch  intact.  Deep tendon reflexes 2+ throughout.  Finger to nose testing intact.  Gait normal, Romberg negative.  IMPRESSION: Chronic migraine complicated by medication overuse Cerebrovascular disease with history of stroke and TIAs Hypertension Hyperlipidemia.  Difficult to reat. Carotid artery dissection  PLAN: 1.  Plavix for secondary stroke prevention 2.  Treatment of hyperlipidemia as per cardio/PCP (LDL goal less than 70).  In meantime, recommended Mediterranean diet and exercise.   3.  Continue blood pressure management as per PCP 4.  Follow up carotid doppler as per vascular surgery 5.  Follow up with me in one year  16 minutes spent face to face with patient, over 50% spent discussing management.   Metta Clines, DO  CC:  Jenna Luo, MD

## 2017-02-13 ENCOUNTER — Other Ambulatory Visit: Payer: Self-pay | Admitting: Family Medicine

## 2017-02-13 DIAGNOSIS — R899 Unspecified abnormal finding in specimens from other organs, systems and tissues: Secondary | ICD-10-CM

## 2017-02-13 NOTE — Telephone Encounter (Signed)
Medication refilled per protocol.  Letter to pt to remind her to come for her 1 month CMP repeat

## 2017-02-20 ENCOUNTER — Other Ambulatory Visit: Payer: Self-pay | Admitting: Family Medicine

## 2017-02-20 DIAGNOSIS — L299 Pruritus, unspecified: Secondary | ICD-10-CM

## 2017-02-20 MED ORDER — GABAPENTIN 300 MG PO CAPS: 300.0000 mg | ORAL_CAPSULE | Freq: Three times a day (TID) | ORAL | 1 refills | Status: DC

## 2017-02-26 ENCOUNTER — Encounter: Payer: Self-pay | Admitting: Internal Medicine

## 2017-02-26 ENCOUNTER — Other Ambulatory Visit: Payer: Self-pay | Admitting: *Deleted

## 2017-02-26 MED ORDER — NITROGLYCERIN 0.4 MG SL SUBL: 0.4000 mg | SUBLINGUAL_TABLET | SUBLINGUAL | 3 refills | Status: DC | PRN

## 2017-03-04 ENCOUNTER — Other Ambulatory Visit: Payer: 59

## 2017-03-04 DIAGNOSIS — R899 Unspecified abnormal finding in specimens from other organs, systems and tissues: Secondary | ICD-10-CM

## 2017-03-04 LAB — COMPLETE METABOLIC PANEL WITH GFR
AG RATIO: 1.6 (calc) (ref 1.0–2.5)
ALT: 98 U/L — ABNORMAL HIGH (ref 6–29)
AST: 58 U/L — AB (ref 10–35)
Albumin: 4.3 g/dL (ref 3.6–5.1)
Alkaline phosphatase (APISO): 84 U/L (ref 33–130)
BILIRUBIN TOTAL: 0.4 mg/dL (ref 0.2–1.2)
BUN: 15 mg/dL (ref 7–25)
CALCIUM: 9.7 mg/dL (ref 8.6–10.4)
CO2: 29 mmol/L (ref 20–32)
Chloride: 103 mmol/L (ref 98–110)
Creat: 0.75 mg/dL (ref 0.50–1.05)
GFR, EST AFRICAN AMERICAN: 105 mL/min/{1.73_m2} (ref >=60)
GFR, EST NON AFRICAN AMERICAN: 91 mL/min/{1.73_m2} (ref >=60)
GLOBULIN: 2.7 g/dL (ref 1.9–3.7)
Glucose, Bld: 101 mg/dL — ABNORMAL HIGH (ref 65–99)
POTASSIUM: 4.6 mmol/L (ref 3.5–5.3)
SODIUM: 138 mmol/L (ref 135–146)
Total Protein: 7 g/dL (ref 6.1–8.1)

## 2017-03-07 ENCOUNTER — Encounter: Payer: Self-pay | Admitting: Family Medicine

## 2017-04-03 ENCOUNTER — Encounter: Payer: Self-pay | Admitting: Internal Medicine

## 2017-04-08 ENCOUNTER — Ambulatory Visit: Payer: 59 | Admitting: Internal Medicine

## 2017-04-18 ENCOUNTER — Encounter: Payer: Self-pay | Admitting: Internal Medicine

## 2017-04-18 ENCOUNTER — Ambulatory Visit: Payer: 59 | Admitting: Internal Medicine

## 2017-04-18 VITALS — BP 141/101 | HR 74 | Ht 68.0 in | Wt 191.6 lb

## 2017-04-18 DIAGNOSIS — I10 Essential (primary) hypertension: Secondary | ICD-10-CM

## 2017-04-18 DIAGNOSIS — G459 Transient cerebral ischemic attack, unspecified: Secondary | ICD-10-CM

## 2017-04-18 DIAGNOSIS — E785 Hyperlipidemia, unspecified: Secondary | ICD-10-CM

## 2017-04-18 DIAGNOSIS — I2510 Atherosclerotic heart disease of native coronary artery without angina pectoris: Secondary | ICD-10-CM

## 2017-04-18 NOTE — Progress Notes (Signed)
OFFICE NOTE  Chief Complaint:  Follow-up dyslipidemia  Primary Care Physician: Susy Frizzle, MD  HPI:  Janet [name] is a 51 y.o. female with a history of moderate coronary artery disease and MI in 2005, at which time she was noted to have a 60-70% distal LAD stenosis and a 60-70% ostial D1 stenosis on cath. There is no clear culprit lesion and she did not receive a stent. She was thought to have vasospasm. Since then she's had numerous stress tests without any recurrent ischemia and she denies any recurrent chest pain. She has had ongoing problems with TIA and has been on and off of Plavix. Mostly she notes visual field cuts as well as arm and facial numbness. She has been seen by Dr. Tomi Likens with neurology and had an echocardiogram this past year which showed an EF of 60-65%, mild LVH, mild AI and mild TR. A bubble study was not performed. She's also had bilateral carotid artery ultrasounds which showed moderate to large amount of atherosclerotic plaque on the right greater than left without hemodynamically significant stenosis. 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 She was then referred to Dr. Servando Snare with thoracic surgery. It was also noted that she had multiple subcentimeter pulmonary nodules which require follow-up. There are no plans for surgery at this time. She was placed on a monitor which demonstrated no obvious arrhythmias or atrial fibrillation. She has a history of statin intolerance in the past. She is previously been on Lipitor, Crestor, Zocor, Pravachol and is currently tolerating Livalo, however, LDL-C on 02/09/2016 was 147, which represents poor control given her high carotid artery plaque burden and history of coronary artery disease (ASCVD).   04/18/2017  Janet [name] returns today for follow-up of her dyslipidemia and coronary disease.  Fortunately she has had no further TIA events this year.   In January she underwent echo with bubble study which was negative for PFO.  She also had carotid Dopplers which were normal.  She does have ASCVD and is been statin intolerant in the past.  Given her prior MI and LAD and diagonal stenosis on catheterization, we recommended aggressive medical therapy with a goal LDL less than 70.  She was recently started on Praluent, however her LDL was not improved.  Subsequently it seems that she was taken off the medication due to elevated liver enzymes.  She does have a history of nonalcoholic fatty liver disease.  Her AST and ALT at baseline were 39 and 60, but increased to 125 and 237 on medication.  Praluent was discontinued and her AST return to 61 with ALT 116.  She was asymptomatic with this.  PMHx:  Past Medical History:  Diagnosis Date  . Adenoma of right adrenal gland    Benign  . Allergy    SEASONAL  . Anxiety   . Arthritis   . Asthma   . Barrett's esophagus 02/09/2014  . CAD (coronary artery disease)    a. prior MI in 2005 with 60-70% dLAD stenosis and 60-70% D2 stenosis (concern for coronary vasospasm)  . Depression   . Elevated LFTs   . Focal nodular hyperplasia of liver   . GERD (gastroesophageal reflux disease)   . Heart murmur    AS A CHILD  . Hepatic steatosis   . History of adenomatous polyp of colon 02/09/2014  . HLD (hyperlipidemia)   . HTN (hypertension)   . Liver  hemangioma   . Liver hemangioma   . Migraine headache   . Myocardial infarction ([place])   . Nonalcoholic steatohepatitis (NASH) 12/11/2013   se[facility]  . Palpitations   . Stroke [facility])     Past Surgical History:  Procedure Laterality Date  . ABDOMINOPLASTY    . BLADDER SUSPENSION    . CERVICAL FUSION     C4, 5, 6  . COLONOSCOPY W/ BIOPSIES    . ESOPHAGOGASTRODUODENOSCOPY    . TONSILLECTOMY    . TUBAL LIGATION      FAMHx:  Family History  Problem Relation Age of Onset  . Coronary artery disease Father   . Aneurysm  Father        AAA  . Melanoma Father   . Transient ischemic attack Father   . Heart disease Father   . AAA (abdominal aortic aneurysm) Father   . Aneurysm Mother        AAA  . Colon polyps Mother   . Heart disease Mother   . Irritable bowel syndrome Mother   . Diverticulitis Mother   . Other Mother        Evlyn Clines  . AAA (abdominal aortic aneurysm) Mother   . Breast cancer Paternal Aunt   . Liver disease Maternal Uncle   . Heart disease Brother     SOCHx:   reports that she quit smoking about 12 months ago. Her smoking use included cigarettes. She smoked 0.30 packs per day. she has never used smokeless tobacco. She reports that she drinks alcohol. She reports that she does not use drugs.  ALLERGIES:  Allergies  Allergen Reactions  . Aggrenox [Aspirin-Dipyridamole Er] Tinitus  . Atorvastatin Other (See Comments)    Myalgias   . Codeine Itching    Nose itches  . Imitrex [Sumatriptan Base] Swelling    Throat swelling  . Isosorbide Nitrate Other (See Comments)    headaches  . Other Other (See Comments)    Artificial sweeteners cause heart palpitations (stevia is ok)  . Rosuvastatin Other (See Comments)    Restless legs  . Simvastatin Other (See Comments)    Myalgias    ROS: Pertinent items noted in HPI and remainder of comprehensive ROS otherwise negative.  HOME MEDS: Current Outpatient Medications on File Prior to Visit  Medication Sig Dispense Refill  . amLODipine (NORVASC) 10 MG tablet TAKE 1 TABLET BY MOUTH EVERY DAY 90 tablet 1  . benazepril (LOTENSIN) 20 MG tablet Take 1 tablet (20 mg total) by mouth daily. 90 tablet 3  . butorphanol (STADOL) 10 MG/ML nasal spray USE 1 SPRAY IN ONE NOSTIL ONCE A DAY AS NEEDED ONLY 2.5 mL 1  . cetirizine (ZYRTEC) 10 MG tablet Take 1 tablet (10 mg total) by mouth daily as needed (seasonal allergies). 30 tablet 3  . clopidogrel (PLAVIX) 75 MG tablet TAKE 1 TABLET (75 MG TOTAL) BY MOUTH DAILY. 90 tablet 2  . fluticasone  (FLONASE) 50 MCG/ACT nasal spray PLACE 2 SPRAYS INTO BOTH NOSTRILS DAILY AS NEEDED FOR SEASONAL ALLERGIES 16 g 11  . gabapentin (NEURONTIN) 300 MG capsule Take 1 capsule (300 mg total) by mouth 3 (three) times daily. 270 capsule 1  . HYDROcodone-acetaminophen (NORCO/VICODIN) 5-325 MG tablet Take 1 tablet by mouth every 6 (six) hours as needed for moderate pain. 30 tablet 0  . ibuprofen (ADVIL,MOTRIN) 200 MG tablet Take 400 mg by mouth 2 (two) times daily as needed (pain).    . nitroGLYCERIN (NITROSTAT) 0.4  MG SL tablet Place 1 tablet (0.4 mg total) under the tongue every 5 (five) minutes as needed for chest pain. 25 tablet 3  . pantoprazole (PROTONIX) 40 MG tablet TAKE 1 TABLET BY MOUTH EVERY DAY BEFORE BREAKFAST 90 tablet 0   No current facility-administered medications on file prior to visit.     LABS/IMAGING: No results found for this or any previous visit (from the past 48 hour(s)). No results found.  WEIGHTS: Wt Readings from Last 3 Encounters:  04/18/17 191 lb 9.6 oz (86.9 kg)  02/06/17 198 lb 9.6 oz (90.1 kg)  01/17/17 195 lb (88.5 kg)    VITALS: BP (!) 141/101   Pulse 74   Ht 5' 8"  (1.727 m)   Wt 191 lb 9.6 oz (86.9 kg)   SpO2 95%   BMI 29.13 kg/m   EXAM: General appearance: alert and no distress Neck: no carotid bruit and no JVD Lungs: clear to auscultation bilaterally Heart: regular rate and rhythm Abdomen: soft, non-tender; bowel sounds normal; no masses,  no organomegaly Extremities: extremities normal, atraumatic, no cyanosis or edema Pulses: 2+ and symmetric Skin: Skin color, texture, turgor normal. No rashes or lesions Neurologic: Grossly normal Psych: Pleasant  EKG: Normal sinus rhythm at 74, inferior and anterior lateral T wave inversions-personally reviewed  ASSESSMENT: 1. Moderate nonobstructive CAD with history of MI in 2005 2. Recurrent TIAs without clear etiology 3. 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 Multiple bilateral pulmonary nodules 5. 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 Mixed dyslipidemia 8. Tobacco abuse - quit x 1 month ago 9. Statin intolerance  PLAN: 1.   Janet [name] has been intolerant to statins and has an elevated LDL of 147.  Her goal LDL is less than 70 and we will not likely reach that target without a PCSK9 inhibitor.  Unfortunately she received 4 doses of Praluent and had an increase in her liver enzymes.  This is greater than 3 times the upper limit of normal but still considered acceptable.  She was taken off the medication.  Her liver enzymes have returned to near baseline.  I think is reasonable to rechallenge her with a PCSK9 inhibitor.  I would consider once monthly Repatha.  We will coordinate that with our pharmacists.  Follow-up with me in 3-6 months.  Pixie Casino, MD, [facility], [place] Director of the Advanced Lipid Disorders &  Cardiovascular Risk Reduction [facility] Attending Cardiologist  Direct Dial: [phone]  Fax: [phone]  Website:  [email or web address] 04/18/2017, 11:49 AM

## 2017-04-18 NOTE — Patient Instructions (Signed)
Your physician wants you to follow-up in: 6 months with Dr. Hilty. You will receive a reminder letter in the mail two months in advance. If you don't receive a letter, please call our office to schedule the follow-up appointment.    

## 2017-05-14 ENCOUNTER — Encounter: Payer: Self-pay | Admitting: Internal Medicine

## 2017-05-14 ENCOUNTER — Other Ambulatory Visit: Payer: Self-pay | Admitting: Family Medicine

## 2017-05-14 ENCOUNTER — Telehealth: Payer: Self-pay | Admitting: Internal Medicine

## 2017-05-14 ENCOUNTER — Other Ambulatory Visit: Payer: Self-pay | Admitting: Internal Medicine

## 2017-05-14 DIAGNOSIS — E785 Hyperlipidemia, unspecified: Secondary | ICD-10-CM

## 2017-05-14 NOTE — Telephone Encounter (Signed)
Dr. Debara Pickett >> Returned call to patient of Dr. Debara Pickett who reports chest pain/jaw pain for about 1.5 weeks. She has used NTGx1 on 3 occasions with relief. She reports fatigue and feels tight in her chest. She reports shortness of breath.   Patient with h/o non-obstructive CAD, last stress test I could locate was from 2011.  Will route to MD ______________________________ CVRR/pharmacy >> Patient also would like to know when to have blood work to check cholesterol. She has had 2 doses of Repatha and is due for another dose on Thursday. She has no more samples. Will route to [place], [facility]

## 2017-05-14 NOTE — Telephone Encounter (Signed)
Patient called and scheduled to see MD 05/15/17 @ 945am

## 2017-05-14 NOTE — Telephone Encounter (Signed)
Used 2 samples of Repatha used without problems. Last dose given ~ 10 days ago.  Patient to repeat fasting Lipid panel and LFTs tomorrow

## 2017-05-14 NOTE — Telephone Encounter (Signed)
-----   Message from [place], Generic sent at 05/14/2017 9:26 AM EST -----    I HAVE BEEN HAVING ALOT OF MILD CHEST PAIN THAT SOMETIMES RADIATES UP INTO MY JAW. THE NITRO DOES HELP BUT IT IS GETTING MORE FREQUENT. PLEASE ADVISE. I ALSO HAVE BEEN TAKING REPATHA AN WOULD LIKE TO KNOW WHEN AND WHERE TO DO BLOOD WORK.  Janet [name]

## 2017-05-14 NOTE — Telephone Encounter (Signed)
Will need to see patient - discuss stress test.  Dr. Lemmie Evens

## 2017-05-15 ENCOUNTER — Encounter: Payer: Self-pay | Admitting: Internal Medicine

## 2017-05-15 ENCOUNTER — Ambulatory Visit: Payer: 59 | Admitting: Internal Medicine

## 2017-05-15 ENCOUNTER — Other Ambulatory Visit: Payer: Self-pay | Admitting: *Deleted

## 2017-05-15 VITALS — BP 143/98 | HR 71 | Ht 68.0 in | Wt 193.0 lb

## 2017-05-15 DIAGNOSIS — I10 Essential (primary) hypertension: Secondary | ICD-10-CM

## 2017-05-15 DIAGNOSIS — I200 Unstable angina: Secondary | ICD-10-CM

## 2017-05-15 DIAGNOSIS — Z01812 Encounter for preprocedural laboratory examination: Secondary | ICD-10-CM | POA: Diagnosis not present

## 2017-05-15 DIAGNOSIS — I7122 Aneurysm of the aortic arch, without rupture: Secondary | ICD-10-CM

## 2017-05-15 DIAGNOSIS — D689 Coagulation defect, unspecified: Secondary | ICD-10-CM

## 2017-05-15 DIAGNOSIS — E785 Hyperlipidemia, unspecified: Secondary | ICD-10-CM | POA: Diagnosis not present

## 2017-05-15 DIAGNOSIS — I712 Thoracic aortic aneurysm, without rupture: Secondary | ICD-10-CM

## 2017-05-15 DIAGNOSIS — I2510 Atherosclerotic heart disease of native coronary artery without angina pectoris: Secondary | ICD-10-CM | POA: Diagnosis not present

## 2017-05-15 HISTORY — DX: Unstable angina: I20.0

## 2017-05-15 LAB — TSH: TSH: 3.09 u[IU]/mL (ref 0.450–4.500)

## 2017-05-15 LAB — HEPATIC FUNCTION PANEL
ALBUMIN: 4.4 g/dL (ref 3.5–5.5)
ALT: 99 IU/L — ABNORMAL HIGH (ref 0–32)
AST: 59 IU/L — AB (ref 0–40)
Alkaline Phosphatase: 96 IU/L (ref 39–117)
BILIRUBIN TOTAL: 0.4 mg/dL (ref 0.0–1.2)
Bilirubin, Direct: 0.16 mg/dL (ref 0.00–0.40)
TOTAL PROTEIN: 7 g/dL (ref 6.0–8.5)

## 2017-05-15 LAB — LIPID PANEL
CHOLESTEROL TOTAL: 241 mg/dL — AB (ref 100–199)
Chol/HDL Ratio: 3.5 ratio (ref 0.0–4.4)
HDL: 68 mg/dL (ref >39)
LDL CALC: 139 mg/dL — AB (ref 0–99)
Triglycerides: 168 mg/dL — ABNORMAL HIGH (ref 0–149)
VLDL Cholesterol Cal: 34 mg/dL (ref 5–40)

## 2017-05-15 LAB — PROTIME-INR
INR: 1 (ref 0.8–1.2)
PROTHROMBIN TIME: 10.4 s (ref 9.1–12.0)

## 2017-05-15 LAB — CBC
HEMOGLOBIN: 17.1 g/dL — AB (ref 11.1–15.9)
Hematocrit: 49.2 % — ABNORMAL HIGH (ref 34.0–46.6)
MCH: 31.1 pg (ref 26.6–33.0)
MCHC: 34.8 g/dL (ref 31.5–35.7)
MCV: 90 fL (ref 79–97)
Platelets: 240 10*3/uL (ref 150–379)
RBC: 5.49 x10E6/uL — ABNORMAL HIGH (ref 3.77–5.28)
RDW: 14.2 % (ref 12.3–15.4)
WBC: 9.5 10*3/uL (ref 3.4–10.8)

## 2017-05-15 LAB — BASIC METABOLIC PANEL
BUN / CREAT RATIO: 13 (ref 9–23)
BUN: 9 mg/dL (ref 6–24)
CO2: 22 mmol/L (ref 20–29)
Calcium: 9.7 mg/dL (ref 8.7–10.2)
Chloride: 100 mmol/L (ref 96–106)
Creatinine, Ser: 0.68 mg/dL (ref 0.57–1.00)
GFR calc Af Amer: 115 mL/min/{1.73_m2} (ref >59)
GFR, EST NON AFRICAN AMERICAN: 100 mL/min/{1.73_m2} (ref >59)
Glucose: 93 mg/dL (ref 65–99)
Potassium: 4.7 mmol/L (ref 3.5–5.2)
SODIUM: 140 mmol/L (ref 134–144)

## 2017-05-15 LAB — APTT: aPTT: 29 s (ref 24–33)

## 2017-05-15 NOTE — Progress Notes (Signed)
OFFICE NOTE  Chief Complaint:  Progressive chest pain  Primary Care Physician: Susy Frizzle, MD  HPI:  Janet [name] is a 52 y.o. female with a history of moderate coronary artery disease and MI in 2005, at which time she was noted to have a 60-70% distal LAD stenosis and a 60-70% ostial D1 stenosis on cath. There is no clear culprit lesion and she did not receive a stent. She was thought to have vasospasm. Since then she's had numerous stress tests without any recurrent ischemia and she denies any recurrent chest pain. She has had ongoing problems with TIA and has been on and off of Plavix. Mostly she notes visual field cuts as well as arm and facial numbness. She has been seen by Dr. Tomi Likens with neurology and had an echocardiogram this past year which showed an EF of 60-65%, mild LVH, mild AI and mild TR. A bubble study was not performed. She's also had bilateral carotid artery ultrasounds which showed moderate to large amount of atherosclerotic plaque on the right greater than left without hemodynamically significant stenosis. 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 She was then referred to Dr. Servando Snare with thoracic surgery. It was also noted that she had multiple subcentimeter pulmonary nodules which require follow-up. There are no plans for surgery at this time. She was placed on a monitor which demonstrated no obvious arrhythmias or atrial fibrillation. She has a history of statin intolerance in the past. She is previously been on Lipitor, Crestor, Zocor, Pravachol and is currently tolerating Livalo, however, LDL-C on 02/09/2016 was 147, which represents poor control given her high carotid artery plaque burden and history of coronary artery disease (ASCVD).   04/18/2017  Janet [name] returns today for follow-up of her dyslipidemia and coronary disease.  Fortunately she has had no further TIA events this year.   In January she underwent echo with bubble study which was negative for PFO.  She also had carotid Dopplers which were normal.  She does have ASCVD and is been statin intolerant in the past.  Given her prior MI and LAD and diagonal stenosis on catheterization, we recommended aggressive medical therapy with a goal LDL less than 70.  She was recently started on Praluent, however her LDL was not improved.  Subsequently it seems that she was taken off the medication due to elevated liver enzymes.  She does have a history of nonalcoholic fatty liver disease.  Her AST and ALT at baseline were 39 and 60, but increased to 125 and 237 on medication.  Praluent was discontinued and her AST return to 61 with ALT 116.  She was asymptomatic with this.  05/15/2017  Janet [name] was seen today as an add-on for acute chest pain.  This was not much of an issue in December when I saw her however over the past 2 weeks she has had some progressive chest discomfort.  She is required nitroglycerin.  She feels as a squeezing pain in the center of her chest which can occur with exertion or at rest.  She said the pain radiates up to her jaw and into her teeth.  She takes nitroglycerin and does have some relief.  This past week she has had to take for 5 nitroglycerin and is concerned about coronary disease.  We repeated her EKG today which demonstrates normal sinus rhythm and inferior and lateral ischemic changes.  This was present  in her EKG in December however may be somewhat worse today.  These changes have been somewhat stable and were considered repolarization abnormalities.  PMHx:  Past Medical History:  Diagnosis Date  . Adenoma of right adrenal gland    Benign  . Allergy    SEASONAL  . Anxiety   . Arthritis   . Asthma   . Barrett's esophagus 02/09/2014  . CAD (coronary artery disease)    a. prior MI in 2005 with 60-70% dLAD stenosis and 60-70% D2 stenosis (concern for coronary vasospasm)  . Depression   . Elevated  LFTs   . Focal nodular hyperplasia of liver   . GERD (gastroesophageal reflux disease)   . Heart murmur    AS A CHILD  . Hepatic steatosis   . History of adenomatous polyp of colon 02/09/2014  . HLD (hyperlipidemia)   . HTN (hypertension)   . Liver hemangioma   . Liver hemangioma   . Migraine headache   . Myocardial infarction ([place])   . Nonalcoholic steatohepatitis (NASH) 12/11/2013   sees Roosevelt Locks at [facility]  . Palpitations   . Stroke [facility])     Past Surgical History:  Procedure Laterality Date  . ABDOMINOPLASTY    . BLADDER SUSPENSION    . CERVICAL FUSION     C4, 5, 6  . COLONOSCOPY W/ BIOPSIES    . ESOPHAGOGASTRODUODENOSCOPY    . TONSILLECTOMY    . TUBAL LIGATION      FAMHx:  Family History  Problem Relation Age of Onset  . Coronary artery disease Father   . Aneurysm Father        AAA  . Melanoma Father   . Transient ischemic attack Father   . Heart disease Father   . AAA (abdominal aortic aneurysm) Father   . Aneurysm Mother        AAA  . Colon polyps Mother   . Heart disease Mother   . Irritable bowel syndrome Mother   . Diverticulitis Mother   . Other Mother        Evlyn Clines  . AAA (abdominal aortic aneurysm) Mother   . Breast cancer Paternal Aunt   . Liver disease Maternal Uncle   . Heart disease Brother     SOCHx:   reports that she quit smoking about 13 months ago. Her smoking use included cigarettes. She smoked 0.30 packs per day. she has never used smokeless tobacco. She reports that she drinks alcohol. She reports that she does not use drugs.  ALLERGIES:  Allergies  Allergen Reactions  . Aggrenox [Aspirin-Dipyridamole Er] Tinitus  . Atorvastatin Other (See Comments)    Myalgias   . Codeine Itching    Nose itches  . Imitrex [Sumatriptan Base] Swelling    Throat swelling  . Isosorbide Nitrate Other (See Comments)    headaches  . Other Other (See Comments)    Artificial sweeteners cause heart palpitations (stevia is  ok)  . Rosuvastatin Other (See Comments)    Restless legs  . Simvastatin Other (See Comments)    Myalgias    ROS: Pertinent items noted in HPI and remainder of comprehensive ROS otherwise negative.  HOME MEDS: Current Outpatient Medications on File Prior to Visit  Medication Sig Dispense Refill  . amLODipine (NORVASC) 10 MG tablet TAKE 1 TABLET BY MOUTH EVERY DAY 90 tablet 1  . benazepril (LOTENSIN) 20 MG tablet Take 1 tablet (20 mg total) by mouth daily. 90 tablet 3  . butorphanol (STADOL) 10 MG/ML  nasal spray USE 1 SPRAY IN ONE NOSTIL ONCE A DAY AS NEEDED ONLY 2.5 mL 1  . cetirizine (ZYRTEC) 10 MG tablet Take 1 tablet (10 mg total) by mouth daily as needed (seasonal allergies). 30 tablet 3  . clopidogrel (PLAVIX) 75 MG tablet TAKE 1 TABLET (75 MG TOTAL) BY MOUTH DAILY. 90 tablet 2  . Evolocumab (REPATHA SURECLICK) 119 MG/ML SOAJ Inject into the skin every 14 (fourteen) days.    . fluticasone (FLONASE) 50 MCG/ACT nasal spray PLACE 2 SPRAYS INTO BOTH NOSTRILS DAILY AS NEEDED FOR SEASONAL ALLERGIES 16 g 11  . gabapentin (NEURONTIN) 300 MG capsule Take 1 capsule (300 mg total) by mouth 3 (three) times daily. 270 capsule 1  . HYDROcodone-acetaminophen (NORCO/VICODIN) 5-325 MG tablet Take 1 tablet by mouth every 6 (six) hours as needed for moderate pain. 30 tablet 0  . ibuprofen (ADVIL,MOTRIN) 200 MG tablet Take 400 mg by mouth 2 (two) times daily as needed (pain).    . nitroGLYCERIN (NITROSTAT) 0.4 MG SL tablet Place 1 tablet (0.4 mg total) under the tongue every 5 (five) minutes as needed for chest pain. 25 tablet 3  . pantoprazole (PROTONIX) 40 MG tablet TAKE 1 TABLET BY MOUTH EVERY DAY BEFORE BREAKFAST 90 tablet 0   No current facility-administered medications on file prior to visit.     LABS/IMAGING: No results found for this or any previous visit (from the past 48 hour(s)). No results found.  WEIGHTS: Wt Readings from Last 3 Encounters:  05/15/17 193 lb (87.5 kg)  04/18/17 191  lb 9.6 oz (86.9 kg)  02/06/17 198 lb 9.6 oz (90.1 kg)    VITALS: BP (!) 143/98   Pulse 71   Ht 5' 8"  (1.727 m)   Wt 193 lb (87.5 kg)   BMI 29.35 kg/m   EXAM: General appearance: alert and no distress Neck: no carotid bruit and no JVD Lungs: clear to auscultation bilaterally Heart: regular rate and rhythm Abdomen: soft, non-tender; bowel sounds normal; no masses,  no organomegaly Extremities: extremities normal, atraumatic, no cyanosis or edema Pulses: 2+ and symmetric Skin: Skin color, texture, turgor normal. No rashes or lesions Neurologic: Grossly normal Psych: Pleasant  EKG: Normal sinus rhythm at 71, voltage criteria for LVH, inferior and anterolateral T wave changes suggestive of ischemia-personally reviewed  ASSESSMENT: 1. Unstable angina 2. Moderate nonobstructive CAD with history of MI in 2005 3. Recurrent TIAs without clear etiology 4. 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 Multiple bilateral pulmonary nodules 6. 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 Mixed dyslipidemia 9. Tobacco abuse - quit x 1 month ago 10. Statin intolerance  PLAN: 1.   Janet [name] is describing increasing nitroglycerin use over the past 2 weeks consistent with unstable angina.  She has a history of moderate, nonobstructive coronary disease with MI in 2005.  She has a marked dyslipidemia and recently restarted Praluent.  We were planning to check a fasting lipid profile today which we will do in addition to her normal labs.  She has been statin intolerant.  I am recommending left heart catheterization for definitive evaluation of her coronary arteries.  We discussed the risks, benefits and alternatives to her and she is agreeable to proceed.  She does have a possible thoracic pseudoaneurysm and this may need to be considered when planning the procedural approach.  Follow-up with me afterward.  Pixie Casino, MD, [facility], [place] Director of the Advanced  Lipid Disorders &  Cardiovascular Risk Reduction Clinic Attending Cardiologist  Direct Dial: [phone]  Fax: [phone]  Website:  [email or web address] 05/15/2017, 10:06 AM

## 2017-05-15 NOTE — Patient Instructions (Signed)
   [facility] [address] [place] [address] Dept: [phone] Loc: [place]  05/15/2017  You are scheduled for a Cardiac Catheterization on Thursday, January 17 with Dr. Shelva Majestic.  1. Please arrive at the [facility] (Main Entrance A) at [facility]: [address] [place], [place] [address] at 9:30 AM (two hours before your procedure to ensure your preparation). Free valet parking service is available.   Special note: Every effort is made to have your procedure done on time. Please understand that emergencies sometimes delay scheduled procedures.  2. Diet: Do not eat or drink anything after midnight prior to your procedure except sips of water to take medications.  3. Labs: blood work today  4. Medication instructions in preparation for your procedure:  On the morning of your procedure, take your Plavix/Clopidogrel and any morning medicines NOT listed above.  You may use sips of water.  5. Plan for one night stay--bring personal belongings. 6. Bring a current list of your medications and current insurance cards. 7. You MUST have a responsible person to drive you home. 8. Someone MUST be with you the first 24 hours after you arrive home or your discharge will be delayed. 9. Please wear clothes that are easy to get on and off and wear slip-on shoes.  Thank you for allowing [place] to care for you!   -- [facility]

## 2017-05-15 NOTE — H&P (View-Only) (Signed)
OFFICE NOTE  Chief Complaint:  Progressive chest pain  Primary Care Physician: Susy Frizzle, MD  HPI:  Janet [name] is a 52 y.o. female with a history of moderate coronary artery disease and MI in 2005, at which time she was noted to have a 60-70% distal LAD stenosis and a 60-70% ostial D1 stenosis on cath. There is no clear culprit lesion and she did not receive a stent. She was thought to have vasospasm. Since then she's had numerous stress tests without any recurrent ischemia and she denies any recurrent chest pain. She has had ongoing problems with TIA and has been on and off of Plavix. Mostly she notes visual field cuts as well as arm and facial numbness. She has been seen by Dr. Tomi Likens with neurology and had an echocardiogram this past year which showed an EF of 60-65%, mild LVH, mild AI and mild TR. A bubble study was not performed. She's also had bilateral carotid artery ultrasounds which showed moderate to large amount of atherosclerotic plaque on the right greater than left without hemodynamically significant stenosis. 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 She was then referred to Dr. Servando Snare with thoracic surgery. It was also noted that she had multiple subcentimeter pulmonary nodules which require follow-up. There are no plans for surgery at this time. She was placed on a monitor which demonstrated no obvious arrhythmias or atrial fibrillation. She has a history of statin intolerance in the past. She is previously been on Lipitor, Crestor, Zocor, Pravachol and is currently tolerating Livalo, however, LDL-C on 02/09/2016 was 147, which represents poor control given her high carotid artery plaque burden and history of coronary artery disease (ASCVD).   04/18/2017  Janet [name] returns today for follow-up of her dyslipidemia and coronary disease.  Fortunately she has had no further TIA events this year.   In January she underwent echo with bubble study which was negative for PFO.  She also had carotid Dopplers which were normal.  She does have ASCVD and is been statin intolerant in the past.  Given her prior MI and LAD and diagonal stenosis on catheterization, we recommended aggressive medical therapy with a goal LDL less than 70.  She was recently started on Praluent, however her LDL was not improved.  Subsequently it seems that she was taken off the medication due to elevated liver enzymes.  She does have a history of nonalcoholic fatty liver disease.  Her AST and ALT at baseline were 39 and 60, but increased to 125 and 237 on medication.  Praluent was discontinued and her AST return to 61 with ALT 116.  She was asymptomatic with this.  05/15/2017  Janet [name] was seen today as an add-on for acute chest pain.  This was not much of an issue in December when I saw her however over the past 2 weeks she has had some progressive chest discomfort.  She is required nitroglycerin.  She feels as a squeezing pain in the center of her chest which can occur with exertion or at rest.  She said the pain radiates up to her jaw and into her teeth.  She takes nitroglycerin and does have some relief.  This past week she has had to take for 5 nitroglycerin and is concerned about coronary disease.  We repeated her EKG today which demonstrates normal sinus rhythm and inferior and lateral ischemic changes.  This was present  in her EKG in December however may be somewhat worse today.  These changes have been somewhat stable and were considered repolarization abnormalities.  PMHx:  Past Medical History:  Diagnosis Date  . Adenoma of right adrenal gland    Benign  . Allergy    SEASONAL  . Anxiety   . Arthritis   . Asthma   . Barrett's esophagus 02/09/2014  . CAD (coronary artery disease)    a. prior MI in 2005 with 60-70% dLAD stenosis and 60-70% D2 stenosis (concern for coronary vasospasm)  . Depression   . Elevated  LFTs   . Focal nodular hyperplasia of liver   . GERD (gastroesophageal reflux disease)   . Heart murmur    AS A CHILD  . Hepatic steatosis   . History of adenomatous polyp of colon 02/09/2014  . HLD (hyperlipidemia)   . HTN (hypertension)   . Liver hemangioma   . Liver hemangioma   . Migraine headache   . Myocardial infarction ([place])   . Nonalcoholic steatohepatitis (NASH) 12/11/2013   sees Roosevelt Locks at [facility]  . Palpitations   . Stroke [facility])     Past Surgical History:  Procedure Laterality Date  . ABDOMINOPLASTY    . BLADDER SUSPENSION    . CERVICAL FUSION     C4, 5, 6  . COLONOSCOPY W/ BIOPSIES    . ESOPHAGOGASTRODUODENOSCOPY    . TONSILLECTOMY    . TUBAL LIGATION      FAMHx:  Family History  Problem Relation Age of Onset  . Coronary artery disease Father   . Aneurysm Father        AAA  . Melanoma Father   . Transient ischemic attack Father   . Heart disease Father   . AAA (abdominal aortic aneurysm) Father   . Aneurysm Mother        AAA  . Colon polyps Mother   . Heart disease Mother   . Irritable bowel syndrome Mother   . Diverticulitis Mother   . Other Mother        Evlyn Clines  . AAA (abdominal aortic aneurysm) Mother   . Breast cancer Paternal Aunt   . Liver disease Maternal Uncle   . Heart disease Brother     SOCHx:   reports that she quit smoking about 13 months ago. Her smoking use included cigarettes. She smoked 0.30 packs per day. she has never used smokeless tobacco. She reports that she drinks alcohol. She reports that she does not use drugs.  ALLERGIES:  Allergies  Allergen Reactions  . Aggrenox [Aspirin-Dipyridamole Er] Tinitus  . Atorvastatin Other (See Comments)    Myalgias   . Codeine Itching    Nose itches  . Imitrex [Sumatriptan Base] Swelling    Throat swelling  . Isosorbide Nitrate Other (See Comments)    headaches  . Other Other (See Comments)    Artificial sweeteners cause heart palpitations (stevia is  ok)  . Rosuvastatin Other (See Comments)    Restless legs  . Simvastatin Other (See Comments)    Myalgias    ROS: Pertinent items noted in HPI and remainder of comprehensive ROS otherwise negative.  HOME MEDS: Current Outpatient Medications on File Prior to Visit  Medication Sig Dispense Refill  . amLODipine (NORVASC) 10 MG tablet TAKE 1 TABLET BY MOUTH EVERY DAY 90 tablet 1  . benazepril (LOTENSIN) 20 MG tablet Take 1 tablet (20 mg total) by mouth daily. 90 tablet 3  . butorphanol (STADOL) 10 MG/ML  nasal spray USE 1 SPRAY IN ONE NOSTIL ONCE A DAY AS NEEDED ONLY 2.5 mL 1  . cetirizine (ZYRTEC) 10 MG tablet Take 1 tablet (10 mg total) by mouth daily as needed (seasonal allergies). 30 tablet 3  . clopidogrel (PLAVIX) 75 MG tablet TAKE 1 TABLET (75 MG TOTAL) BY MOUTH DAILY. 90 tablet 2  . Evolocumab (REPATHA SURECLICK) 937 MG/ML SOAJ Inject into the skin every 14 (fourteen) days.    . fluticasone (FLONASE) 50 MCG/ACT nasal spray PLACE 2 SPRAYS INTO BOTH NOSTRILS DAILY AS NEEDED FOR SEASONAL ALLERGIES 16 g 11  . gabapentin (NEURONTIN) 300 MG capsule Take 1 capsule (300 mg total) by mouth 3 (three) times daily. 270 capsule 1  . HYDROcodone-acetaminophen (NORCO/VICODIN) 5-325 MG tablet Take 1 tablet by mouth every 6 (six) hours as needed for moderate pain. 30 tablet 0  . ibuprofen (ADVIL,MOTRIN) 200 MG tablet Take 400 mg by mouth 2 (two) times daily as needed (pain).    . nitroGLYCERIN (NITROSTAT) 0.4 MG SL tablet Place 1 tablet (0.4 mg total) under the tongue every 5 (five) minutes as needed for chest pain. 25 tablet 3  . pantoprazole (PROTONIX) 40 MG tablet TAKE 1 TABLET BY MOUTH EVERY DAY BEFORE BREAKFAST 90 tablet 0   No current facility-administered medications on file prior to visit.     LABS/IMAGING: No results found for this or any previous visit (from the past 48 hour(s)). No results found.  WEIGHTS: Wt Readings from Last 3 Encounters:  05/15/17 193 lb (87.5 kg)  04/18/17 191  lb 9.6 oz (86.9 kg)  02/06/17 198 lb 9.6 oz (90.1 kg)    VITALS: BP (!) 143/98   Pulse 71   Ht 5' 8"  (1.727 m)   Wt 193 lb (87.5 kg)   BMI 29.35 kg/m   EXAM: General appearance: alert and no distress Neck: no carotid bruit and no JVD Lungs: clear to auscultation bilaterally Heart: regular rate and rhythm Abdomen: soft, non-tender; bowel sounds normal; no masses,  no organomegaly Extremities: extremities normal, atraumatic, no cyanosis or edema Pulses: 2+ and symmetric Skin: Skin color, texture, turgor normal. No rashes or lesions Neurologic: Grossly normal Psych: Pleasant  EKG: Normal sinus rhythm at 71, voltage criteria for LVH, inferior and anterolateral T wave changes suggestive of ischemia-personally reviewed  ASSESSMENT: 1. Unstable angina 2. Moderate nonobstructive CAD with history of MI in 2005 3. Recurrent TIAs without clear etiology 4. 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 Multiple bilateral pulmonary nodules 6. 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 Mixed dyslipidemia 9. Tobacco abuse - quit x 1 month ago 10. Statin intolerance  PLAN: 1.   Janet [name] is describing increasing nitroglycerin use over the past 2 weeks consistent with unstable angina.  She has a history of moderate, nonobstructive coronary disease with MI in 2005.  She has a marked dyslipidemia and recently restarted Praluent.  We were planning to check a fasting lipid profile today which we will do in addition to her normal labs.  She has been statin intolerant.  I am recommending left heart catheterization for definitive evaluation of her coronary arteries.  We discussed the risks, benefits and alternatives to her and she is agreeable to proceed.  She does have a possible thoracic pseudoaneurysm and this may need to be considered when planning the procedural approach.  Follow-up with me afterward.  Pixie Casino, MD, [facility], [place] Director of the Advanced  Lipid Disorders &  Cardiovascular Risk Reduction Clinic Attending Cardiologist  Direct Dial: [phone]  Fax: [phone]  Website:  [email or web address] 05/15/2017, 10:06 AM

## 2017-05-15 NOTE — Telephone Encounter (Signed)
Requesting refill    Stadol  LOV:  12/20/16  LRF:  03/26/16

## 2017-05-16 ENCOUNTER — Ambulatory Visit (HOSPITAL_COMMUNITY)
Admission: RE | Admit: 2017-05-16 | Discharge: 2017-05-16 | Disposition: A | Discharge status: Home / Self Care | Payer: 59 | Source: Ambulatory Visit | Attending: Cardiovascular Disease | Admitting: Cardiovascular Disease

## 2017-05-16 ENCOUNTER — Encounter (HOSPITAL_COMMUNITY)
Admission: RE | Disposition: A | Discharge status: Home / Self Care | Payer: Self-pay | Source: Ambulatory Visit | Attending: Cardiovascular Disease

## 2017-05-16 DIAGNOSIS — R58 Hemorrhage, not elsewhere classified: Secondary | ICD-10-CM | POA: Insufficient documentation

## 2017-05-16 DIAGNOSIS — E782 Mixed hyperlipidemia: Secondary | ICD-10-CM | POA: Insufficient documentation

## 2017-05-16 DIAGNOSIS — Z8249 Family history of ischemic heart disease and other diseases of the circulatory system: Secondary | ICD-10-CM | POA: Insufficient documentation

## 2017-05-16 DIAGNOSIS — I10 Essential (primary) hypertension: Secondary | ICD-10-CM | POA: Insufficient documentation

## 2017-05-16 DIAGNOSIS — Z7902 Long term (current) use of antithrombotics/antiplatelets: Secondary | ICD-10-CM | POA: Insufficient documentation

## 2017-05-16 DIAGNOSIS — Z79899 Other long term (current) drug therapy: Secondary | ICD-10-CM | POA: Insufficient documentation

## 2017-05-16 DIAGNOSIS — Z87891 Personal history of nicotine dependence: Secondary | ICD-10-CM | POA: Insufficient documentation

## 2017-05-16 DIAGNOSIS — K219 Gastro-esophageal reflux disease without esophagitis: Secondary | ICD-10-CM | POA: Insufficient documentation

## 2017-05-16 DIAGNOSIS — Z8673 Personal history of transient ischemic attack (TIA), and cerebral infarction without residual deficits: Secondary | ICD-10-CM | POA: Insufficient documentation

## 2017-05-16 DIAGNOSIS — T466X5A Adverse effect of antihyperlipidemic and antiarteriosclerotic drugs, initial encounter: Secondary | ICD-10-CM | POA: Diagnosis not present

## 2017-05-16 DIAGNOSIS — I252 Old myocardial infarction: Secondary | ICD-10-CM | POA: Diagnosis not present

## 2017-05-16 DIAGNOSIS — R079 Chest pain, unspecified: Secondary | ICD-10-CM | POA: Diagnosis present

## 2017-05-16 DIAGNOSIS — I200 Unstable angina: Secondary | ICD-10-CM

## 2017-05-16 DIAGNOSIS — R918 Other nonspecific abnormal finding of lung field: Secondary | ICD-10-CM | POA: Insufficient documentation

## 2017-05-16 DIAGNOSIS — I25110 Atherosclerotic heart disease of native coronary artery with unstable angina pectoris: Secondary | ICD-10-CM | POA: Insufficient documentation

## 2017-05-16 HISTORY — PX: LEFT HEART CATH AND CORONARY ANGIOGRAPHY: CATH118249

## 2017-05-16 SURGERY — LEFT HEART CATH AND CORONARY ANGIOGRAPHY
Anesthesia: LOCAL

## 2017-05-16 MED ORDER — FENTANYL CITRATE (PF) 100 MCG/2ML IJ SOLN
INTRAMUSCULAR | Status: DC | PRN
  Administered 2017-05-16 (×2): 25 ug via INTRAVENOUS

## 2017-05-16 MED ORDER — LIDOCAINE HCL (PF) 1 % IJ SOLN
INTRAMUSCULAR | Status: AC
  Filled 2017-05-16: qty 30

## 2017-05-16 MED ORDER — ASPIRIN 81 MG PO CHEW: 81.0000 mg | CHEWABLE_TABLET | Freq: Every day | ORAL | Status: DC

## 2017-05-16 MED ORDER — LIDOCAINE HCL (PF) 1 % IJ SOLN
INTRAMUSCULAR | Status: DC | PRN
  Administered 2017-05-16: 2 mL

## 2017-05-16 MED ORDER — HEPARIN (PORCINE) IN NACL 2-0.9 UNIT/ML-% IJ SOLN
INTRAMUSCULAR | Status: AC
  Filled 2017-05-16: qty 1000

## 2017-05-16 MED ORDER — MIDAZOLAM HCL 2 MG/2ML IJ SOLN
INTRAMUSCULAR | Status: AC
  Filled 2017-05-16: qty 2

## 2017-05-16 MED ORDER — ONDANSETRON HCL 4 MG/2ML IJ SOLN: 4.0000 mg | Freq: Four times a day (QID) | INTRAMUSCULAR | Status: DC | PRN

## 2017-05-16 MED ORDER — SODIUM CHLORIDE 0.9 % IV SOLN
INTRAVENOUS | Status: DC
  Administered 2017-05-16: 13:00:00 via INTRAVENOUS

## 2017-05-16 MED ORDER — IOPAMIDOL (ISOVUE-370) INJECTION 76%
INTRAVENOUS | Status: AC
  Filled 2017-05-16: qty 100

## 2017-05-16 MED ORDER — HYDRALAZINE HCL 20 MG/ML IJ SOLN: 10.0000 mg | INTRAMUSCULAR | Status: DC | PRN

## 2017-05-16 MED ORDER — MIDAZOLAM HCL 2 MG/2ML IJ SOLN
INTRAMUSCULAR | Status: DC | PRN
  Administered 2017-05-16: 1 mg via INTRAVENOUS
  Administered 2017-05-16: 2 mg via INTRAVENOUS

## 2017-05-16 MED ORDER — SODIUM CHLORIDE 0.9 % IV SOLN: 250.0000 mL | INTRAVENOUS | Status: DC | PRN

## 2017-05-16 MED ORDER — HYDRALAZINE HCL 20 MG/ML IJ SOLN
5.0000 mg | Freq: Once | INTRAMUSCULAR | Status: AC
  Administered 2017-05-16: 5 mg via INTRAVENOUS

## 2017-05-16 MED ORDER — HEPARIN SODIUM (PORCINE) 1000 UNIT/ML IJ SOLN
INTRAMUSCULAR | Status: AC
  Filled 2017-05-16: qty 1

## 2017-05-16 MED ORDER — SODIUM CHLORIDE 0.9 % IV SOLN: INTRAVENOUS | Status: DC

## 2017-05-16 MED ORDER — HEPARIN SODIUM (PORCINE) 1000 UNIT/ML IJ SOLN
INTRAMUSCULAR | Status: DC | PRN
  Administered 2017-05-16: 4500 [IU] via INTRAVENOUS

## 2017-05-16 MED ORDER — HYDRALAZINE HCL 20 MG/ML IJ SOLN
INTRAMUSCULAR | Status: AC
  Administered 2017-05-16: 5 mg via INTRAVENOUS
  Filled 2017-05-16: qty 1

## 2017-05-16 MED ORDER — SODIUM CHLORIDE 0.9% FLUSH: 3.0000 mL | Freq: Two times a day (BID) | INTRAVENOUS | Status: DC

## 2017-05-16 MED ORDER — CLOPIDOGREL BISULFATE 75 MG PO TABS: 75.0000 mg | ORAL_TABLET | Freq: Every day | ORAL | Status: DC

## 2017-05-16 MED ORDER — DIAZEPAM 5 MG PO TABS: 5.0000 mg | ORAL_TABLET | Freq: Four times a day (QID) | ORAL | Status: DC | PRN

## 2017-05-16 MED ORDER — FENTANYL CITRATE (PF) 100 MCG/2ML IJ SOLN
INTRAMUSCULAR | Status: AC
  Filled 2017-05-16: qty 2

## 2017-05-16 MED ORDER — HEPARIN (PORCINE) IN NACL 2-0.9 UNIT/ML-% IJ SOLN
INTRAMUSCULAR | Status: AC | PRN
  Administered 2017-05-16: 1000 mL

## 2017-05-16 MED ORDER — VERAPAMIL HCL 2.5 MG/ML IV SOLN
INTRAVENOUS | Status: AC
  Filled 2017-05-16: qty 2

## 2017-05-16 MED ORDER — SODIUM CHLORIDE 0.9% FLUSH: 3.0000 mL | INTRAVENOUS | Status: DC | PRN

## 2017-05-16 MED ORDER — SODIUM CHLORIDE 0.9% FLUSH
3.0000 mL | INTRAVENOUS | Status: DC | PRN
Start: 2017-05-16 — End: 2017-05-16

## 2017-05-16 MED ORDER — VERAPAMIL HCL 2.5 MG/ML IV SOLN
INTRAVENOUS | Status: DC | PRN
  Administered 2017-05-16: 10 mL via INTRA_ARTERIAL

## 2017-05-16 MED ORDER — ACETAMINOPHEN 325 MG PO TABS: 650.0000 mg | ORAL_TABLET | ORAL | Status: DC | PRN

## 2017-05-16 MED ORDER — IOPAMIDOL (ISOVUE-370) INJECTION 76%
INTRAVENOUS | Status: DC | PRN
  Administered 2017-05-16: 85 mL via INTRA_ARTERIAL

## 2017-05-16 MED ORDER — HYDRALAZINE HCL 10 MG PO TABS
20.0000 mg | ORAL_TABLET | Freq: Once | ORAL | Status: AC
  Administered 2017-05-16: 20 mg via ORAL
  Filled 2017-05-16: qty 2

## 2017-05-16 SURGICAL SUPPLY — 14 items
CATH INFINITI 5 FR JL3.5 (CATHETERS) ×2 IMPLANT
CATH INFINITI 5FR ANG PIGTAIL (CATHETERS) ×2 IMPLANT
CATH OPTITORQUE TIG 4.0 5F (CATHETERS) ×2 IMPLANT
COVER PRB 48X5XTLSCP FOLD TPE (BAG) ×1 IMPLANT
COVER PROBE 5X48 (BAG) ×1
DEVICE RAD COMP TR BAND LRG (VASCULAR PRODUCTS) ×2 IMPLANT
GLIDESHEATH SLEND SS 6F .021 (SHEATH) ×2 IMPLANT
GUIDEWIRE INQWIRE 1.5J.035X260 (WIRE) ×1 IMPLANT
INQWIRE 1.5J .035X260CM (WIRE) ×2
KIT HEART LEFT (KITS) ×2 IMPLANT
PACK CARDIAC CATHETERIZATION (CUSTOM PROCEDURE TRAY) ×2 IMPLANT
SYR MEDRAD MARK V 150ML (SYRINGE) ×2 IMPLANT
TRANSDUCER W/STOPCOCK (MISCELLANEOUS) ×2 IMPLANT
TUBING CIL FLEX 10 FLL-RA (TUBING) ×2 IMPLANT

## 2017-05-16 NOTE — Interval H&P Note (Signed)
Cath Lab Visit (complete for each Cath Lab visit)  Clinical Evaluation Leading to the Procedure:   ACS: No.  Non-ACS:    Anginal Classification: CCS III  Anti-ischemic medical therapy: Minimal Therapy (1 class of medications)  Non-Invasive Test Results: No non-invasive testing performed  Prior CABG: No previous CABG      History and Physical Interval Note:  05/16/2017 2:56 PM  Janet [name]  has presented today for surgery, with the diagnosis of unstable angina  The various methods of treatment have been discussed with the patient and family. After consideration of risks, benefits and other options for treatment, the patient has consented to  Procedure(s): LEFT HEART CATH AND CORONARY ANGIOGRAPHY (N/A) as a surgical intervention .  The patient's history has been reviewed, patient examined, no change in status, stable for surgery.  I have reviewed the patient's chart and labs.  Questions were answered to the patient's satisfaction.     Shelva Majestic

## 2017-05-16 NOTE — Discharge Instructions (Signed)

## 2017-05-16 NOTE — Progress Notes (Signed)
Called to see pt re: ecchymosis at cath site and HTN  Pt BP elevated 130s-160s w/ DBP 90s  Pt had 1 dose of hydralazine w/ some improvement, but BP is going back up now.   Ecchymosis is 3 cm wide at most, tapers down and extends 1/3 the way up her forearm. It is soft and no evidence of ongoing hemorrhage.  OK to d/c later today if does well once TR band removed. Pt and husband aware that she must not sleep on her stomach tonight. She agrees to sleep in a recliner or on the couch with her hand propped up.   Will give her hydralazine 10 mg IV and 10 mg po to make sure her BP is controlled overnight.  F/u as scheduled.  Rosaria Ferries, PA-C 05/16/2017 7:15 PM Bee[place] [phone]

## 2017-05-17 ENCOUNTER — Encounter (HOSPITAL_COMMUNITY): Payer: Self-pay | Admitting: Cardiovascular Disease

## 2017-06-03 NOTE — Progress Notes (Addendum)
Cardiology Office Note:    Date:  06/04/2017   ID:  Janet [name], DOB Sep 09, 1965, MRN [id number]  PCP:  Susy Frizzle, MD  Cardiologist:  Pixie Casino, MD   Referring MD: Susy Frizzle, MD   Chief Complaint  Patient presents with  . Hospitalization Follow-up    heart cath    History of Present Illness:    Janet [name] is a 52 y.o. female with a hx of hypertension, CAD status post MI (2005 no intervention, thought to be spasm, at age 32), carotid artery stenosis by [place] with moderate to large atherosclerotic plaque, hx of TIA/stroke 2017 (on and off of plavix), HLD intolerant to multiple statins (previously on livalo and praluent, praluent stopped for elevated LFT and no improvement in LDL, now on repatha), HTN, HLD, GERD, and anxiety. 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   Janet [name] presents today for hospital follow up.  She was seen in clinic on 05/15/17 by Dr. Debara Pickett for 2 weeks of progressive chest discomfort, responsive to nitro. She was admitted to [facility] and underwent heart cath 05/16/17 in which no intervention was made (40-50% stenosis in 1st diagnoal and 20% stenosis in midLAD.  She was continued on medical therapy.    Since that admission, she continues to have chest pain.  She reports that this chest pain is different than the chest pain she was having prior to her heart catheterization last month.  This chest pain happens at rest, is described as a tightness or pressure, lasts 1-2 minutes, has no aggravating or relieving symptoms.  He has not taken any sublingual nitro for this chest pain hence her heart catheterization.  He also reports that she gets short of breath when going to bed and this is associated with rapid heart rate.  She denies heart palpitations and heart skipping, she states that now her heart feels like it  races and this lasts 30-60 minutes.  This heart racing happens every 2-3 days.  She last wore a Holter monitor in 2017 that showed normal sinus rhythm.  At that time, event monitor was worn for feelings of heart skipping.  These episodes resolved when she discontinued artificial sweeteners.  She also states that she has felt fatigued, with low appetite for the past year.  Review of prior labs are negative for anemia.  Right radial site is clean, dry, and without bleeding.  EKG today with normal sinus rhythm, unchanged from prior.  Pressure today is slightly elevated at 144/102.  Patient states that her pressure is generally well controlled at home in the 130s/80s.  He is compliant on all medications.  Past Medical History:  Diagnosis Date  . Adenoma of right adrenal gland    Benign  . Allergy    SEASONAL  . Anxiety   . Arthritis   . Asthma   . Barrett's esophagus 02/09/2014  . CAD (coronary artery disease)    a. prior MI in 2005 with 60-70% dLAD stenosis and 60-70% D2 stenosis (concern for coronary vasospasm)  . Depression   . Elevated LFTs   . Focal nodular hyperplasia of liver   . GERD (gastroesophageal reflux disease)   . Heart murmur    AS A CHILD  . Hepatic steatosis   . History of adenomatous polyp of colon 02/09/2014  . HLD (hyperlipidemia)   . HTN (hypertension)   .  Liver hemangioma   . Liver hemangioma   . Migraine headache   . Myocardial infarction ([place])   . Nonalcoholic steatohepatitis ([facility]) 12/11/2013   sees [facility] at [facility]  . Palpitations   . Stroke [facility])     Past Surgical History:  Procedure Laterality Date  . ABDOMINOPLASTY    . BLADDER SUSPENSION    . CERVICAL FUSION     C4, 5, 6  . COLONOSCOPY W/ BIOPSIES    . ESOPHAGOGASTRODUODENOSCOPY    . LEFT HEART CATH AND CORONARY ANGIOGRAPHY N/A 05/16/2017   Procedure: LEFT HEART CATH AND CORONARY ANGIOGRAPHY;  Surgeon: Troy Sine, MD;  Location: [place] [facility];  Service:  Cardiovascular;  Laterality: N/A;  . TONSILLECTOMY    . TUBAL LIGATION      Current Medications: Current Meds  Medication Sig  . amLODipine (NORVASC) 10 MG tablet TAKE 1 TABLET BY MOUTH EVERY DAY (Patient taking differently: TAKE 10 MG BY MOUTH EVERY DAY)  . aspirin EC 81 MG tablet Take 81 mg by mouth daily.  . benazepril (LOTENSIN) 20 MG tablet Take 1 tablet (20 mg total) by mouth daily.  . butorphanol (STADOL) 10 MG/ML nasal spray INSTILL 1 SPRAY INTO ONE NOSTRIL ONCE DAILY IF NEEDED  . cetirizine (ZYRTEC) 10 MG tablet Take 1 tablet (10 mg total) by mouth daily as needed (seasonal allergies).  . clopidogrel (PLAVIX) 75 MG tablet TAKE 1 TABLET (75 MG TOTAL) BY MOUTH DAILY.  Marland Kitchen Evolocumab (REPATHA SURECLICK) 497 MG/ML SOAJ Inject 140 mg into the skin every 14 (fourteen) days.   . fluticasone (FLONASE) 50 MCG/ACT nasal spray PLACE 2 SPRAYS INTO BOTH NOSTRILS DAILY AS NEEDED FOR SEASONAL ALLERGIES  . gabapentin (NEURONTIN) 300 MG capsule Take 1 capsule (300 mg total) by mouth 3 (three) times daily.  Marland Kitchen HYDROcodone-acetaminophen (NORCO/VICODIN) 5-325 MG tablet Take 1 tablet by mouth every 6 (six) hours as needed for moderate pain.  Marland Kitchen ibuprofen (ADVIL,MOTRIN) 200 MG tablet Take 400 mg by mouth 2 (two) times daily as needed for headache or moderate pain.   . nitroGLYCERIN (NITROSTAT) 0.4 MG SL tablet Place 1 tablet (0.4 mg total) under the tongue every 5 (five) minutes as needed for chest pain.  . pantoprazole (PROTONIX) 40 MG tablet TAKE 1 TABLET BY MOUTH EVERY DAY BEFORE BREAKFAST (Patient taking differently: Take 40 mg by mouth daily. )  . [DISCONTINUED] nitroGLYCERIN (NITROSTAT) 0.4 MG SL tablet Place 1 tablet (0.4 mg total) under the tongue every 5 (five) minutes as needed for chest pain.     Allergies:   Aggrenox [aspirin-dipyridamole er]; Atorvastatin; Codeine; Imitrex [sumatriptan base]; Isosorbide nitrate; Other; Rosuvastatin; and Simvastatin   Social History   Socioeconomic History  .  Marital status: Married    Spouse name: None  . Number of children: 1  . Years of education: None  . Highest education level: None  Social Needs  . Financial resource strain: None  . Food insecurity - worry: None  . Food insecurity - inability: None  . Transportation needs - medical: None  . Transportation needs - non-medical: None  Occupational History  . Occupation: Tax inspector  Tobacco Use  . Smoking status: Former Smoker    Packs/day: 0.30    Types: Cigarettes    Last attempt to quit: 03/30/2016    Years since quitting: 1.1  . Smokeless tobacco: Never Used  Substance and Sexual Activity  . Alcohol use: Yes    Comment: occasional; 1 per day  . Drug  use: No  . Sexual activity: None  Other Topics Concern  . None  Social History Narrative  . None     Family History: The patient's family history includes AAA (abdominal aortic aneurysm) in her father and mother; Aneurysm in her father and mother; Breast cancer in her paternal aunt; Colon polyps in her mother; Coronary artery disease in her father; Diverticulitis in her mother; Heart disease in her brother, father, and mother; Irritable bowel syndrome in her mother; Liver disease in her maternal uncle; Melanoma in her father; Other in her mother; Transient ischemic attack in her father.  ROS:   Please see the history of present illness.    All other systems reviewed and are negative.  EKGs/Labs/Other Studies Reviewed:    The following studies were reviewed today:  Left heart cath 05/16/17  Ost 1st Diag lesion is 45% stenosed.  Mid LAD lesion is 20% stenosed.  The left ventricular systolic function is normal.  LV end diastolic pressure is normal.  The left ventricular ejection fraction is 55-65% by visual estimate.   Normal LV size and function without focal segmental wall motion abnormal marrow days.  Ejection fraction at 60-65%.  Mild not significantly obstructive CAD with 40-50% smooth ostial narrowing  involving the ostium of the diagonal 1 vessel, and 20% mid LAD narrowing; normal left circumflex coronary artery; and dominant RCA with mild luminal irregularities and narrowing of 20% proximally.  RECOMMENDATION: Medical therapy.  The patient will follow-up with Dr. Debara Pickett.   EKG:  EKG is ordered today.  The ekg ordered today demonstrates normal sinus rhythm, unchanged from prior.  Recent Labs: 05/15/2017: ALT 99; BUN 9; Creatinine, Ser 0.68; Hemoglobin 17.1; Platelets 240; Potassium 4.7; Sodium 140; TSH 3.090  Recent Lipid Panel    Component Value Date/Time   CHOL 241 (H) 05/15/2017 1025   TRIG 168 (H) 05/15/2017 1025   HDL 68 05/15/2017 1025   CHOLHDL 3.5 05/15/2017 1025   CHOLHDL 3.4 02/09/2016 0937   VLDL 31 (H) 02/09/2016 0937   LDLCALC 139 (H) 05/15/2017 1025   LDLDIRECT 173.6 12/17/2011 0951    Physical Exam:    VS:  BP (!) 144/102   Pulse 81   Ht 5' 8"  (1.727 m)   Wt 190 lb (86.2 kg)   BMI 28.89 kg/m     Wt Readings from Last 3 Encounters:  06/04/17 190 lb (86.2 kg)  05/16/17 193 lb (87.5 kg)  05/15/17 193 lb (87.5 kg)     GEN: Well nourished, well developed in no acute distress HEENT: Normal NECK: No JVD; No carotid bruits LYMPHATICS: No lymphadenopathy CARDIAC: RRR, no murmurs, rubs, gallops RESPIRATORY:  Clear to auscultation without rales, wheezing or rhonchi  ABDOMEN: Soft, non-tender, non-distended MUSCULOSKELETAL:  No edema; No deformity  SKIN: Warm and dry NEUROLOGIC:  Alert and oriented x 3 PSYCHIATRIC:  Normal affect   ASSESSMENT:    1. Coronary artery disease involving native coronary artery of native heart without angina pectoris   2. Rapid heart rate   3. Hyperlipidemia with target LDL less than 70   4. Essential hypertension   5. Transient cerebral ischemia, unspecified type    PLAN:    In order of problems listed above:  1. CAD s/p MI 2005, heart cath 05/16/17 without intervention, medical therapy Pt continues to have chest pain  that occurs at rest and is described as a tightness/pressure.  This is different than the chest pain she had prior to her recent heart catheterization.  EKG  today is unchanged from prior.  Low suspicion that an ACS process is happening.  She is already on Norvasc, which is an appropriate medication for possible vasospasm.  Has previously been intolerant to Imdur secondary to headache. Continue ASA and plavix (hx of stroke).  If her event monitor (below) is negative for arrhythmia, will explore additional medications for ongoing chest pain.  2. Heart racing, general fatigue, loss of appetite, atypical chest pain Prior TSH was within normal limits.  Prior CBC without anemia.  I am concerned that this heart racing, which occurs 30-60 minutes at a time and happens every 2-3 days, may represent an arrhythmia.  This may be contributing to her overall feeling of fatigue and chest pain.  Had a long discussion about wearing an event monitor.  She was hesitant, but eventually agreed to wear a heart monitor.  Husband at bedside is very encouraging.  She does not think that her chest pain and palpitations/heart racing are correlated.  I will check a BMP and magnesium today.  We discussed a future sleep study given her daytime sleepiness.  She would like to hold off on this for now and revisit the issue with her PCP.  3. HLD She was previously on livalo and praluent. Both were held for elevated LFTs. Livalo since restarted. Pt tried repatha; however, did not have improvement in her lipid profile and had elevated LFTs.  She has since started taking fish oil.  Will defer to lipid clinic for management.  Discussed dietary improvements.  Encouraged walking 30 minutes daily.  States she is limited in activity due to bilateral hip pain from arthritis.  4. Essential hypertension Pressures have been well-controlled at home. Continue benazepril and norvasc. She required hydralazine during her hospitalization for hypertension.   Pressure was slightly elevated today in the office: However, she reports that her pressures generally elevated during doctor's appointments.  No medication changes today.  5.  History of TIA Continue aspirin, Plavix.  No recent events.   Medication Adjustments/Labs and Tests Ordered: Current medicines are reviewed at length with the patient today.  Concerns regarding medicines are outlined above.  Orders Placed This Encounter  Procedures  . Basic Metabolic Panel (BMET)  . Magnesium  . Cardiac event monitor  . EKG 12-Lead   Meds ordered this encounter  Medications  . nitroGLYCERIN (NITROSTAT) 0.4 MG SL tablet    Sig: Place 1 tablet (0.4 mg total) under the tongue every 5 (five) minutes as needed for chest pain.    Dispense:  25 tablet    Refill:  3    Signed, Ledora Bottcher, [place]  06/04/2017 11:22 AM    [facility]

## 2017-06-04 ENCOUNTER — Encounter: Payer: Self-pay | Admitting: Physician Assistant

## 2017-06-04 ENCOUNTER — Ambulatory Visit (INDEPENDENT_AMBULATORY_CARE_PROVIDER_SITE_OTHER): Payer: 59 | Admitting: Physician Assistant

## 2017-06-04 VITALS — BP 144/102 | HR 81 | Ht 68.0 in | Wt 190.0 lb

## 2017-06-04 DIAGNOSIS — I2510 Atherosclerotic heart disease of native coronary artery without angina pectoris: Secondary | ICD-10-CM

## 2017-06-04 DIAGNOSIS — I10 Essential (primary) hypertension: Secondary | ICD-10-CM

## 2017-06-04 DIAGNOSIS — R000 Tachycardia, unspecified: Secondary | ICD-10-CM

## 2017-06-04 DIAGNOSIS — G459 Transient cerebral ischemic attack, unspecified: Secondary | ICD-10-CM

## 2017-06-04 DIAGNOSIS — E785 Hyperlipidemia, unspecified: Secondary | ICD-10-CM

## 2017-06-04 LAB — BASIC METABOLIC PANEL
BUN/Creatinine Ratio: 14 (ref 9–23)
BUN: 10 mg/dL (ref 6–24)
CALCIUM: 10 mg/dL (ref 8.7–10.2)
CHLORIDE: 99 mmol/L (ref 96–106)
CO2: 25 mmol/L (ref 20–29)
Creatinine, Ser: 0.74 mg/dL (ref 0.57–1.00)
GFR calc Af Amer: 107 mL/min/{1.73_m2} (ref >59)
GFR calc non Af Amer: 93 mL/min/{1.73_m2} (ref >59)
GLUCOSE: 94 mg/dL (ref 65–99)
POTASSIUM: 5.3 mmol/L — AB (ref 3.5–5.2)
Sodium: 141 mmol/L (ref 134–144)

## 2017-06-04 LAB — MAGNESIUM: Magnesium: 2.3 mg/dL (ref 1.6–2.3)

## 2017-06-04 MED ORDER — NITROGLYCERIN 0.4 MG SL SUBL: 0.4000 mg | SUBLINGUAL_TABLET | SUBLINGUAL | 3 refills | Status: AC | PRN

## 2017-06-04 NOTE — Patient Instructions (Signed)
Medication Instructions:  Your physician recommends that you continue on your current medications as directed. Please refer to the Current Medication list given to you today.  Labwork: Your physician recommends that you return for lab work in: TODAY-BMET, MAG  Testing/Procedures: Your physician has recommended that you wear an 30 Day event monitor. Event monitors are medical devices that record the heart's electrical activity. Doctors most often [place] these monitors to diagnose arrhythmias. Arrhythmias are problems with the speed or rhythm of the heartbeat. The monitor is a small, portable device. You can wear one while you do your normal daily activities. This is usually used to diagnose what is causing palpitations/syncope (passing out). THIS MONITOR IS PLACED ON AT THE [place] [address] [place]  Follow-Up: Your physician recommends that you schedule a follow-up appointment in: 6 WEEKS after monitor appointment with Dr Debara Pickett.  Any Other Special Instructions Will Be Listed Below (If Applicable). If you need a refill on your cardiac medications before your next appointment, please call your pharmacy.

## 2017-06-05 DIAGNOSIS — K7581 Nonalcoholic steatohepatitis (NASH): Secondary | ICD-10-CM | POA: Diagnosis not present

## 2017-06-05 DIAGNOSIS — D1803 Hemangioma of intra-abdominal structures: Secondary | ICD-10-CM | POA: Diagnosis not present

## 2017-06-05 DIAGNOSIS — K740 Hepatic fibrosis: Secondary | ICD-10-CM | POA: Diagnosis not present

## 2017-06-14 ENCOUNTER — Encounter: Payer: Self-pay | Admitting: *Deleted

## 2017-06-17 ENCOUNTER — Ambulatory Visit (INDEPENDENT_AMBULATORY_CARE_PROVIDER_SITE_OTHER): Payer: 59

## 2017-06-17 DIAGNOSIS — G459 Transient cerebral ischemic attack, unspecified: Secondary | ICD-10-CM

## 2017-06-17 DIAGNOSIS — E785 Hyperlipidemia, unspecified: Secondary | ICD-10-CM

## 2017-06-17 DIAGNOSIS — I10 Essential (primary) hypertension: Secondary | ICD-10-CM

## 2017-06-17 DIAGNOSIS — R000 Tachycardia, unspecified: Secondary | ICD-10-CM | POA: Diagnosis not present

## 2017-06-17 DIAGNOSIS — I2510 Atherosclerotic heart disease of native coronary artery without angina pectoris: Secondary | ICD-10-CM | POA: Diagnosis not present

## 2017-07-25 ENCOUNTER — Encounter: Payer: Self-pay | Admitting: Internal Medicine

## 2017-07-25 ENCOUNTER — Ambulatory Visit (INDEPENDENT_AMBULATORY_CARE_PROVIDER_SITE_OTHER): Payer: 59 | Admitting: Internal Medicine

## 2017-07-25 VITALS — BP 152/108 | HR 53 | Ht 68.0 in | Wt 186.0 lb

## 2017-07-25 DIAGNOSIS — E785 Hyperlipidemia, unspecified: Secondary | ICD-10-CM | POA: Diagnosis not present

## 2017-07-25 DIAGNOSIS — I2510 Atherosclerotic heart disease of native coronary artery without angina pectoris: Secondary | ICD-10-CM | POA: Diagnosis not present

## 2017-07-25 DIAGNOSIS — I471 Supraventricular tachycardia: Secondary | ICD-10-CM | POA: Diagnosis not present

## 2017-07-25 MED ORDER — METOPROLOL SUCCINATE ER 25 MG PO TB24: 12.5000 mg | ORAL_TABLET | Freq: Every day | ORAL | 3 refills | Status: DC

## 2017-07-25 MED ORDER — EZETIMIBE 10 MG PO TABS: 10.0000 mg | ORAL_TABLET | Freq: Every day | ORAL | 3 refills | Status: DC

## 2017-07-25 NOTE — Patient Instructions (Addendum)
Juxtapid - cholesterol medication  Your physician has recommended you make the following change in your medication  -- START metoprolol succinate 12.45m once daily -- START zetia 185monce daily  Your physician [place] that [place] to check cholesterol and Liver function. Please have this done about 1 week prior to your next visit.   Your physician recommends that you schedule a [place] appointment [place]

## 2017-07-25 NOTE — Progress Notes (Signed)
OFFICE NOTE  Chief Complaint:  Palpitations  Primary Care Physician: Susy Frizzle, MD  HPI:  Janet [name] is a 52 y.o. female with a history of moderate coronary artery disease and MI in 2005, at which time she was noted to have a 60-70% distal LAD stenosis and a 60-70% ostial D1 stenosis on cath. There is no clear culprit lesion and she did not receive a stent. She was thought to have vasospasm. Since then she's had numerous stress tests without any recurrent ischemia and she denies any recurrent chest pain. She has had ongoing problems with TIA and has been on and off of Plavix. Mostly she notes visual field cuts as well as arm and facial numbness. She has been seen by Dr. Tomi Likens with neurology and had an echocardiogram this past year which showed an EF of 60-65%, mild LVH, mild AI and mild TR. A bubble study was not performed. She's also had bilateral carotid artery ultrasounds which showed moderate to large amount of atherosclerotic plaque on the right greater than left without hemodynamically significant stenosis. 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 She was then referred to Dr. Servando Snare with thoracic surgery. It was also noted that she had multiple subcentimeter pulmonary nodules which require follow-up. There are no plans for surgery at this time. She was placed on a monitor which demonstrated no obvious arrhythmias or atrial fibrillation. She has a history of statin intolerance in the past. She is previously been on Lipitor, Crestor, Zocor, Pravachol and is currently tolerating Livalo, however, LDL-C on 02/09/2016 was 147, which represents poor control given her high carotid artery plaque burden and history of coronary artery disease (ASCVD).   04/18/2017  Janet [name] returns today for follow-up of her dyslipidemia and coronary disease.  Fortunately she has had no further TIA events this year.  In  January she underwent echo with bubble study which was negative for PFO.  She also had carotid Dopplers which were normal.  She does have ASCVD and is been statin intolerant in the past.  Given her prior MI and LAD and diagonal stenosis on catheterization, we recommended aggressive medical therapy with a goal LDL less than 70.  She was recently started on Praluent, however her LDL was not improved.  Subsequently it seems that she was taken off the medication due to elevated liver enzymes.  She does have a history of nonalcoholic fatty liver disease.  Her AST and ALT at baseline were 39 and 60, but increased to 125 and 237 on medication.  Praluent was discontinued and her AST return to 61 with ALT 116.  She was asymptomatic with this.  05/15/2017  Janet [name] was seen today as an add-on for acute chest pain.  This was not much of an issue in December when I saw her however over the past 2 weeks she has had some progressive chest discomfort.  She is required nitroglycerin.  She feels as a squeezing pain in the center of her chest which can occur with exertion or at rest.  She said the pain radiates up to her jaw and into her teeth.  She takes nitroglycerin and does have some relief.  This past week she has had to take for 5 nitroglycerin and is concerned about coronary disease.  We repeated her EKG today which demonstrates normal sinus rhythm and inferior and lateral ischemic changes.  This was present in her  EKG in December however may be somewhat worse today.  These changes have been somewhat stable and were considered repolarization abnormalities.  07/25/2017  Janet [name] was recently seen by our advanced practice providers for symptoms of chest pain, but her symptoms so included palpitations.  She wore a monitor which showed some paroxysmal atrial tachycardia.  Short runs less than 10 beats.  It is notable that her heart rate varies.  In February heart rate was in the 80s and in January in the 70s.  Today  her heart rates in the low 50s, but I still think there is room for her to potentially be on a beta-blocker.  In addition we discussed her dyslipidemia.  Unfortunately she has been intolerant to statins and had had no response to both PCSK9 inhibitors.  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  I offered genetic testing however due to cost issues he declined.  She tells me that she has a significant family history of dyslipidemia and her brother recently started on Repatha.  I suspect he may not have improvement either.  We discussed another alternative, namely Lomitapide.  This would have to be used very cautiously given her history of abnormal liver enzymes in the past, but could significantly decrease her cholesterol by an alternative mechanism.  She wishes to discuss this further.  In the meantime I would recommend we try her on ezetimibe.  She does not have a history of trying that before.  It was ordered but apparently was too expensive when it was previously non-generic.  PMHx:  Past Medical History:  Diagnosis Date  . Adenoma of right adrenal gland    Benign  . Allergy    SEASONAL  . Anxiety   . Arthritis   . Asthma   . Barrett's esophagus 02/09/2014  . CAD (coronary artery disease)    a. prior MI in 2005 with 60-70% dLAD stenosis and 60-70% D2 stenosis (concern for coronary vasospasm)  . Depression   . Elevated LFTs   . Focal nodular hyperplasia of liver   . GERD (gastroesophageal reflux disease)   . Heart murmur    AS A CHILD  . Hepatic steatosis   . History of adenomatous polyp of colon 02/09/2014  . HLD (hyperlipidemia)   . HTN (hypertension)   . Liver hemangioma   . Liver hemangioma   . Migraine headache   . Myocardial infarction ([place])   . Nonalcoholic steatohepatitis (NASH) 12/11/2013   sees Roosevelt Locks at [facility]  . Palpitations   . Stroke [facility])     Past Surgical History:  Procedure Laterality Date  . ABDOMINOPLASTY    . BLADDER SUSPENSION      . CERVICAL FUSION     C4, 5, 6  . COLONOSCOPY W/ BIOPSIES    . ESOPHAGOGASTRODUODENOSCOPY    . LEFT HEART CATH AND CORONARY ANGIOGRAPHY N/A 05/16/2017   Procedure: LEFT HEART CATH AND CORONARY ANGIOGRAPHY;  Surgeon: Troy Sine, MD;  Location: [place] [facility];  Service: Cardiovascular;  Laterality: N/A;  . TONSILLECTOMY    . TUBAL LIGATION      FAMHx:  Family History  Problem Relation Age of Onset  . Coronary artery disease Father   . Aneurysm Father        AAA  . Melanoma Father   . Transient ischemic attack Father   . Heart disease Father   . AAA (abdominal aortic aneurysm) Father   . Aneurysm Mother  AAA  . Colon polyps Mother   . Heart disease Mother   . Irritable bowel syndrome Mother   . Diverticulitis Mother   . Other Mother        Evlyn Clines  . AAA (abdominal aortic aneurysm) Mother   . Breast cancer Paternal Aunt   . Liver disease Maternal Uncle   . Heart disease Brother     SOCHx:   reports that she quit smoking about 15 months ago. Her smoking use included cigarettes. She smoked 0.30 packs per day. She has never used smokeless tobacco. She reports that she drinks alcohol. She reports that she does not use drugs.  ALLERGIES:  Allergies  Allergen Reactions  . Aggrenox [Aspirin-Dipyridamole Er] Tinitus  . Atorvastatin Other (See Comments)    Myalgias   . Codeine Itching and Other (See Comments)    Nose itches  . Imitrex [Sumatriptan Base] Swelling and Other (See Comments)    Throat swelling  . Isosorbide Nitrate Other (See Comments)    headaches  . Other Other (See Comments)    Artificial sweeteners cause heart palpitations (stevia is ok)  . Rosuvastatin Other (See Comments)    Restless legs  . Simvastatin Other (See Comments)    Myalgias    ROS: Pertinent items noted in HPI and remainder of comprehensive ROS otherwise negative.  HOME MEDS: Current Outpatient Medications on File Prior to Visit  Medication Sig Dispense Refill  .  amLODipine (NORVASC) 10 MG tablet TAKE 1 TABLET BY MOUTH EVERY DAY (Patient taking differently: TAKE 10 MG BY MOUTH EVERY DAY) 90 tablet 1  . aspirin EC 81 MG tablet Take 81 mg by mouth daily.    . benazepril (LOTENSIN) 20 MG tablet Take 1 tablet (20 mg total) by mouth daily. 90 tablet 3  . butorphanol (STADOL) 10 MG/ML nasal spray INSTILL 1 SPRAY INTO ONE NOSTRIL ONCE DAILY IF NEEDED 2.5 mL 0  . cetirizine (ZYRTEC) 10 MG tablet Take 1 tablet (10 mg total) by mouth daily as needed (seasonal allergies). 30 tablet 3  . clopidogrel (PLAVIX) 75 MG tablet TAKE 1 TABLET (75 MG TOTAL) BY MOUTH DAILY. 90 tablet 2  . Evolocumab (REPATHA SURECLICK) 993 MG/ML SOAJ Inject 140 mg into the skin every 14 (fourteen) days.     . fluticasone (FLONASE) 50 MCG/ACT nasal spray PLACE 2 SPRAYS INTO BOTH NOSTRILS DAILY AS NEEDED FOR SEASONAL ALLERGIES 16 g 11  . gabapentin (NEURONTIN) 300 MG capsule Take 1 capsule (300 mg total) by mouth 3 (three) times daily. 270 capsule 1  . HYDROcodone-acetaminophen (NORCO/VICODIN) 5-325 MG tablet Take 1 tablet by mouth every 6 (six) hours as needed for moderate pain. 30 tablet 0  . ibuprofen (ADVIL,MOTRIN) 200 MG tablet Take 400 mg by mouth 2 (two) times daily as needed for headache or moderate pain.     . nitroGLYCERIN (NITROSTAT) 0.4 MG SL tablet Place 1 tablet (0.4 mg total) under the tongue every 5 (five) minutes as needed for chest pain. 25 tablet 3  . pantoprazole (PROTONIX) 40 MG tablet TAKE 1 TABLET BY MOUTH EVERY DAY BEFORE BREAKFAST (Patient taking differently: Take 40 mg by mouth daily. ) 90 tablet 0   No current facility-administered medications on file prior to visit.     LABS/IMAGING: No results found for this or any previous visit (from the past 48 hour(s)). No results found.  WEIGHTS: Wt Readings from Last 3 Encounters:  07/25/17 186 lb (84.4 kg)  06/04/17 190 lb (86.2 kg)  05/16/17 193 lb (  87.5 kg)    VITALS: BP (!) 152/108   Pulse (!) 53   Ht 5' 8"   (1.727 m)   Wt 186 lb (84.4 kg)   BMI 28.28 kg/m   EXAM: General appearance: alert and no distress Neck: no carotid bruit and no JVD Lungs: clear to auscultation bilaterally Heart: regular rate and rhythm Abdomen: soft, non-tender; bowel sounds normal; no masses,  no organomegaly Extremities: extremities normal, atraumatic, no cyanosis or edema Pulses: 2+ and symmetric Skin: Skin color, texture, turgor normal. No rashes or lesions Neurologic: Grossly normal Psych: Pleasant  EKG: Deferred  ASSESSMENT: 1. PAT 2. Moderate nonobstructive CAD with history of MI in 2005 3. Recurrent TIAs without clear etiology 4. 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 Multiple bilateral pulmonary nodules 6. 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 Tobacco abuse - quit x 1 month ago 10. Statin intolerance  PLAN: 1.   Janet [name] had been having symptoms of chest discomfort and paroxysmal events which were likely tachycardia.  Monitoring showed paroxysmal atrial tachycardia but no evidence for A. fib.  I would recommend starting low-dose Toprol-XL 12.5 mg daily.  Although heart rate was low today it has been in the 70s and 80s at other times.  This should help with her tachycardia.  With regards to her dyslipidemia.  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  I suspect she has an LDL receptor mutation as she was a nonresponder to PCSK9.  She is statin intolerant.  She is agreeable to try ezetimibe which was not used before mostly due to cost issues.  We discussed lomitapide.  This would be challenging to use given her elevated liver enzymes in the past.  She wishes to consider it further and will discuss it at follow-up.  Follow-up with me in 3 months with a lipid profile and liver enzymes.  Pixie Casino, MD, [facility], [place] Director of the [facility]  [facility] Attending Cardiologist  Direct Dial: [phone]  Fax: [phone]  Website:  [email or web address] Dina Mobley 07/25/2017, 10:59 AM

## 2017-07-29 ENCOUNTER — Telehealth: Payer: Self-pay | Admitting: Internal Medicine

## 2017-07-29 DIAGNOSIS — E875 Hyperkalemia: Secondary | ICD-10-CM

## 2017-07-29 DIAGNOSIS — Z79899 Other long term (current) drug therapy: Secondary | ICD-10-CM

## 2017-07-29 NOTE — Telephone Encounter (Signed)
Patient seen by MD on 07/25/17. He recommended repeat BMET to assess potassium, as this was slightly elevated on 05/2017 labs ordered by A. Duke, PA. Patient called and made aware of MD recommendation. She agrees w/plan. She is aware she needs to have non-fasting labs done this week. Provided info of 2 [facility] testing locations in [place] (our office @ [facility] @ [place] [address])

## 2017-08-01 ENCOUNTER — Encounter: Payer: Self-pay | Admitting: Internal Medicine

## 2017-08-01 ENCOUNTER — Other Ambulatory Visit (INDEPENDENT_AMBULATORY_CARE_PROVIDER_SITE_OTHER): Payer: 59

## 2017-08-01 ENCOUNTER — Ambulatory Visit (INDEPENDENT_AMBULATORY_CARE_PROVIDER_SITE_OTHER): Payer: 59 | Admitting: Internal Medicine

## 2017-08-01 VITALS — BP 122/86 | HR 75 | Ht 68.0 in | Wt 184.4 lb

## 2017-08-01 DIAGNOSIS — R6881 Early satiety: Secondary | ICD-10-CM | POA: Diagnosis not present

## 2017-08-01 DIAGNOSIS — K7581 Nonalcoholic steatohepatitis (NASH): Secondary | ICD-10-CM | POA: Diagnosis not present

## 2017-08-01 DIAGNOSIS — E875 Hyperkalemia: Secondary | ICD-10-CM

## 2017-08-01 DIAGNOSIS — K2270 Barrett's esophagus without dysplasia: Secondary | ICD-10-CM

## 2017-08-01 DIAGNOSIS — K582 Mixed irritable bowel syndrome: Secondary | ICD-10-CM | POA: Diagnosis not present

## 2017-08-01 LAB — BASIC METABOLIC PANEL
BUN: 12 mg/dL (ref 6–23)
CALCIUM: 10 mg/dL (ref 8.4–10.5)
CO2: 28 meq/L (ref 19–32)
Chloride: 100 mEq/L (ref 96–112)
Creatinine, Ser: 0.74 mg/dL (ref 0.40–1.20)
GFR: 86.91 mL/min (ref >60.00)
Glucose, Bld: 92 mg/dL (ref 70–99)
Potassium: 4.5 mEq/L (ref 3.5–5.1)
Sodium: 138 mEq/L (ref 135–145)

## 2017-08-01 MED ORDER — PANTOPRAZOLE SODIUM 40 MG PO TBEC: DELAYED_RELEASE_TABLET | ORAL | 3 refills | Status: DC

## 2017-08-01 NOTE — Progress Notes (Signed)
Janet [name] 52 y.o. 02/16/1966 [id number] Assessment & Plan:   Encounter Diagnoses  Name Primary?  . Early satiety   . Barrett's esophagus without dysplasia   . Irritable bowel syndrome with both constipation and diarrhea Yes  . NASH (nonalcoholic steatohepatitis)   . Hyperkalemia      Miralax purge to see if that "resets" bowel habits like colonoscopy prep did in past Benefiber1 tbsp qd after the purge I think she probably has irritable bowel syndrome with mixed pattern.  Hopefully regulation of bowel habits will occur here.  With colonoscopy in 2015 that was negative except for a small adenoma (due 2020) do not see a strong need to repeat that right now but it is possible.  We talked about that and she is not inclined to do that. EGD to evaluate early satiety and f/u Barrett's - ok to stay on Plavix The risks and benefits as well as alternatives of endoscopic procedure(s) have been discussed and reviewed. All questions answered. The patient agrees to proceed.  I discussed that she should limit EtOH in setting of NASH - increased risk of cirrhosis.  Fortunately despite the increased transaminases based upon her age BMI platelet count her fibrosis score still remains low though she is clearly at risk of cirrhosis.  She needs a BMET as she had recent hyperkalemia - Dr. Debara Pickett requested so will do in our lab and forward to him.  She undoubtedly has metabolic syndrome she is struggling with hyperlipidemia and lack of response to treatment and Dr. Debara Pickett is working on that.  She has coronary artery disease and fatty liver as well.  No diabetes fortunately.  However last hemoglobin A1c in 2017 was 5.8 which I think makes her prediabetic so obviously at risk.  Lifestyle modification is important.  I appreciate the opportunity to care for her.  PF:[id number], Cammie Mcgee, MD   Subjective:   Chief Complaint: constipation and diarrhea, nausea HPI  The patient is here for follow-up  visit, she has been having intermittent nausea and early satiety problems, she has a history of short segment Barrett's esophagus on EGD a number of years ago, 2015 to be exact.  For the past 6-8 months her bowel habits of alternating between loose Bristol stool scale approximately 6 or 3 to  hard rocks Bristol stool scale 1. She denies a change in medications or anything like that.  She is defecating daily.  There is a lot of bloating and she also has early satiety.  She tried MiraLAX and that helped but then eventually it caused diarrhea so she stopped it after about 4 or 5 weeks of treatment using 1 A day.  She believes she has lost weight about 10 pounds she thinks, records show a 6 pound weight loss since February.  She is drinking about 1 glass of wine a week she says.  She was last seen in this office in December 2015 she has Karlene Lineman with fibrosis.  She was referred to [facility] have pathology, she ended up having MRI of the liver twice  as well as steatosis and she has 2 enhancing hepatic lesions consistent with hemangioma and FNH each that are stable 2015-2017.  In 2015 she had a liver biopsy that demonstrated Karlene Lineman with mild early fibrosis peri-portal.  At that time she was drinking several times a week.  Platelets have been normal.  Transaminases remain elevated.  Note she said that she had bad constipation for years and then after her colonoscopy prep  she had regular easy defecation for a number of years until 6 or 8 months ago.   Wt Readings from Last 3 Encounters:  08/01/17 184 lb 6 oz (83.6 kg)  07/25/17 186 lb (84.4 kg)  06/04/17 190 lb (86.2 kg)   She was admitted in January of this year, unstable angina diagnosis, multivessel but not distensible coronary artery disease.  Good EF.  Medical management recommended.  Allergies  Allergen Reactions  . Aggrenox [Aspirin-Dipyridamole Er] Tinitus  . Atorvastatin Other (See Comments)    Myalgias   . Codeine Itching and Other (See Comments)     Nose itches  . Imitrex [Sumatriptan Base] Swelling and Other (See Comments)    Throat swelling  . Isosorbide Nitrate Other (See Comments)    headaches  . Other Other (See Comments)    Artificial sweeteners cause heart palpitations (stevia is ok)  . Rosuvastatin Other (See Comments)    Restless legs  . Simvastatin Other (See Comments)    Myalgias   Current Meds  Medication Sig  . amLODipine (NORVASC) 10 MG tablet TAKE 1 TABLET BY MOUTH EVERY DAY (Patient taking differently: TAKE 10 MG BY MOUTH EVERY DAY)  . aspirin EC 81 MG tablet Take 81 mg by mouth daily.  . benazepril (LOTENSIN) 20 MG tablet Take 1 tablet (20 mg total) by mouth daily.  . butorphanol (STADOL) 10 MG/ML nasal spray INSTILL 1 SPRAY INTO ONE NOSTRIL ONCE DAILY IF NEEDED  . cetirizine (ZYRTEC) 10 MG tablet Take 1 tablet (10 mg total) by mouth daily as needed (seasonal allergies).  . clopidogrel (PLAVIX) 75 MG tablet TAKE 1 TABLET (75 MG TOTAL) BY MOUTH DAILY.  Marland Kitchen ezetimibe (ZETIA) 10 MG tablet Take 1 tablet (10 mg total) by mouth daily.  . fluticasone (FLONASE) 50 MCG/ACT nasal spray PLACE 2 SPRAYS INTO BOTH NOSTRILS DAILY AS NEEDED FOR SEASONAL ALLERGIES  . gabapentin (NEURONTIN) 300 MG capsule Take 1 capsule (300 mg total) by mouth 3 (three) times daily.  Marland Kitchen HYDROcodone-acetaminophen (NORCO/VICODIN) 5-325 MG tablet Take 1 tablet by mouth every 6 (six) hours as needed for moderate pain.  Marland Kitchen ibuprofen (ADVIL,MOTRIN) 200 MG tablet Take 400 mg by mouth 2 (two) times daily as needed for headache or moderate pain.   . metoprolol succinate (TOPROL XL) 25 MG 24 hr tablet Take 0.5 tablets (12.5 mg total) by mouth daily.  . nitroGLYCERIN (NITROSTAT) 0.4 MG SL tablet Place 1 tablet (0.4 mg total) under the tongue every 5 (five) minutes as needed for chest pain.   Past Medical History:  Diagnosis Date  . Adenoma of right adrenal gland    Benign  . Allergy    SEASONAL  . Anxiety   . Arthritis   . Asthma   . Barrett's esophagus  02/09/2014  . CAD (coronary artery disease)    a. prior MI in 2005 with 60-70% dLAD stenosis and 60-70% D2 stenosis (concern for coronary vasospasm)  . Depression   . Elevated LFTs   . Focal nodular hyperplasia of liver   . GERD (gastroesophageal reflux disease)   . Heart murmur    AS A CHILD  . Hepatic steatosis   . History of adenomatous polyp of colon 02/09/2014  . HLD (hyperlipidemia)   . HTN (hypertension)   . Liver hemangioma   . Migraine headache   . Myocardial infarction ([place])   . Nonalcoholic steatohepatitis (NASH) 12/11/2013   sees Roosevelt Locks at [facility]  . Palpitations   . Stroke [facility])  Past Surgical History:  Procedure Laterality Date  . ABDOMINOPLASTY    . BLADDER SUSPENSION    . CERVICAL FUSION     C4, 5, 6  . COLONOSCOPY W/ BIOPSIES    . ESOPHAGOGASTRODUODENOSCOPY    . LEFT HEART CATH AND CORONARY ANGIOGRAPHY N/A 05/16/2017   Procedure: LEFT HEART CATH AND CORONARY ANGIOGRAPHY;  Surgeon: Troy Sine, MD;  Location: [place] [facility];  Service: Cardiovascular;  Laterality: N/A;  . TONSILLECTOMY    . TUBAL LIGATION     Social History   Social History Narrative   She is married with 1 son   She is a Tax inspector at a factory   Former smoker   Alcohol is used at least once a week glass of wine   Denies drug use   family history includes AAA (abdominal aortic aneurysm) in her father and mother; Aneurysm in her father and mother; Breast cancer in her paternal aunt; Colon polyps in her mother; Coronary artery disease in her father; Diverticulitis in her mother; Heart disease in her brother, father, and mother; Irritable bowel syndrome in her mother; Liver disease in her maternal uncle; Melanoma in her father; Other in her mother; Transient ischemic attack in her father.   Review of Systems See HPI.  All other review of systems are negative at this time.  Objective:   Physical Exam  @BP  122/86   Pulse 75   Ht 5' 8"  (1.727 m)    Wt 184 lb 6 oz (83.6 kg)   BMI 28.03 kg/m @  General:  Well-developed, well-nourished and in no acute distress Eyes:  anicteric. Neck:   supple w/o thyromegaly or mass.  Lungs: Clear to auscultation bilaterally. Heart:  S1S2, no rubs, murmurs, gallops. Abdomen:  soft, non-tender, no hepatosplenomegaly, hernia, or mass and BS+.  Extremities:   no edema, cyanosis or clubbing Skin   no rash.  I do not see any stigmata of chronic liver disease Neuro:  A&O x 3.  Psych:  appropriate mood and  Affect.   Data Reviewed:  see HPI.

## 2017-08-01 NOTE — Patient Instructions (Addendum)
You have been scheduled for an endoscopy. Please follow written instructions given to you at your visit today. If you use inhalers (even only as needed), please bring them with you on the day of your procedure.   Your provider has requested that you go to the basement level for lab work before leaving today. Press "B" on the elevator. The lab is located at the first door on the left as you exit the elevator.   We have sent the following medications to your pharmacy for you to pick up at your convenience: Protonix  Dr Carlean Purl recommends that you complete a bowel purge (to clean out your bowels). Please do the following: Purchase a bottle of Miralax over the counter as well as a box of 5 mg dulcolax tablets. Take 4 dulcolax tablets. Wait 1 hour. You will then drink 6-8 capfuls of Miralax mixed in an adequate amount of water/juice/gatorade (you may choose which of these liquids to drink) over the next 2-3 hours. You should expect results within 1 to 6 hours after completing the bowel purge.   Use benefiber daily, handout provided.  I appreciate the opportunity to care for you. Silvano [place], MD, [facility]

## 2017-08-01 NOTE — Progress Notes (Signed)
Potassium normal today as is kidney function and other electrolytes Cc to Dr. Debara Pickett and My Chart to patient

## 2017-08-13 ENCOUNTER — Ambulatory Visit: Payer: 59 | Admitting: Family Medicine

## 2017-08-13 ENCOUNTER — Encounter: Payer: Self-pay | Admitting: Family Medicine

## 2017-08-13 ENCOUNTER — Telehealth: Payer: Self-pay | Admitting: Internal Medicine

## 2017-08-13 VITALS — BP 130/78 | HR 78 | Temp 98.8°F | Resp 16 | Ht 68.0 in | Wt 185.0 lb

## 2017-08-13 DIAGNOSIS — R5382 Chronic fatigue, unspecified: Secondary | ICD-10-CM | POA: Diagnosis not present

## 2017-08-13 DIAGNOSIS — M25511 Pain in right shoulder: Secondary | ICD-10-CM

## 2017-08-13 NOTE — Telephone Encounter (Signed)
Return call to Pt who states she was started on Metoprolol succinate 12.5 mg once a day back on 07/25/17. Pt reports having an episode on sat where she felt dizzy and faint with some palpations. She was unable to check BP at that moment but last reading on yesterday 4/15 was 100/60. Pt concerned that her BP is dropping too low and would like to she if she can continue with the Metoprolol but decrease or stop one of her other BP medication. Routing to Dr. Debara Pickett for recommendation.

## 2017-08-13 NOTE — Progress Notes (Signed)
Subjective:    Patient ID: Janet [name], female    DOB: [birth year], 52 y.o.   MRN: [id number]  HPI Patient presents with greater than 4 months of pain in her right shoulder.  She has pain with abduction greater than 90 degrees.  She has pain with internal and external rotation.  She has a positive empty can sign.  She has a positive drop test.  She has pain with Hawkins maneuver.  She also has a positive O'Brien sign.  Passive range of motion is limited to approximately 100 degrees without pain.  She has decreased strength with resisted abduction. Patient also presents today with fatigue.  Patient states that she has no energy.  She easily becomes fatigued and tired with minimal activity.  She denies any chest pain.  She has no significant shortness of breath.  She denies any night sweats fevers or hemoptysis.  She does have a remote history of smoking.  However she has quit.  Her blood pressure has been relatively low at home.  She had a near presyncopal episode when her blood pressure was 100/60.  Cardiology just started her on metoprolol due to palpitations and her heart.  However she is also on amlodipine and she believes the blood pressure may be too low. Past Medical History:  Diagnosis Date  . Adenoma of right adrenal gland    Benign  . Allergy    SEASONAL  . Anxiety   . Arthritis   . Asthma   . Barrett's esophagus 02/09/2014  . CAD (coronary artery disease)    a. prior MI in 2005 with 60-70% dLAD stenosis and 60-70% D2 stenosis (concern for coronary vasospasm)  . Depression   . Elevated LFTs   . Focal nodular hyperplasia of liver   . GERD (gastroesophageal reflux disease)   . Heart murmur    AS A CHILD  . Hepatic steatosis   . History of adenomatous polyp of colon 02/09/2014  . HLD (hyperlipidemia)   . HTN (hypertension)   . Liver hemangioma   . Migraine headache   . Myocardial infarction ([place])   . Nonalcoholic steatohepatitis (NASH) 12/11/2013   sees Roosevelt Locks at  [facility]  . Palpitations   . Stroke [facility])    Past Surgical History:  Procedure Laterality Date  . ABDOMINOPLASTY    . BLADDER SUSPENSION    . CERVICAL FUSION     C4, 5, 6  . COLONOSCOPY W/ BIOPSIES    . ESOPHAGOGASTRODUODENOSCOPY    . LEFT HEART CATH AND CORONARY ANGIOGRAPHY N/A 05/16/2017   Procedure: LEFT HEART CATH AND CORONARY ANGIOGRAPHY;  Surgeon: Troy Sine, MD;  Location: [facility];  Service: Cardiovascular;  Laterality: N/A;  . TONSILLECTOMY    . TUBAL LIGATION     Current Outpatient Medications on File Prior to Visit  Medication Sig Dispense Refill  . amLODipine (NORVASC) 10 MG tablet TAKE 1 TABLET BY MOUTH EVERY DAY (Patient taking differently: TAKE 10 MG BY MOUTH EVERY DAY) 90 tablet 1  . aspirin EC 81 MG tablet Take 81 mg by mouth daily.    . benazepril (LOTENSIN) 20 MG tablet Take 1 tablet (20 mg total) by mouth daily. 90 tablet 3  . butorphanol (STADOL) 10 MG/ML nasal spray INSTILL 1 SPRAY INTO ONE NOSTRIL ONCE DAILY IF NEEDED 2.5 mL 0  . cetirizine (ZYRTEC) 10 MG tablet Take 1 tablet (10 mg total) by mouth daily as needed (seasonal allergies). 30 tablet 3  .  clopidogrel (PLAVIX) 75 MG tablet TAKE 1 TABLET (75 MG TOTAL) BY MOUTH DAILY. 90 tablet 2  . ezetimibe (ZETIA) 10 MG tablet Take 1 tablet (10 mg total) by mouth daily. 90 tablet 3  . fluticasone (FLONASE) 50 MCG/ACT nasal spray PLACE 2 SPRAYS INTO BOTH NOSTRILS DAILY AS NEEDED FOR SEASONAL ALLERGIES 16 g 11  . gabapentin (NEURONTIN) 300 MG capsule Take 1 capsule (300 mg total) by mouth 3 (three) times daily. 270 capsule 1  . HYDROcodone-acetaminophen (NORCO/VICODIN) 5-325 MG tablet Take 1 tablet by mouth every 6 (six) hours as needed for moderate pain. 30 tablet 0  . ibuprofen (ADVIL,MOTRIN) 200 MG tablet Take 400 mg by mouth 2 (two) times daily as needed for headache or moderate pain.     . metoprolol succinate (TOPROL XL) 25 MG 24 hr tablet Take 0.5 tablets (12.5 mg total) by mouth  daily. 45 tablet 3  . nitroGLYCERIN (NITROSTAT) 0.4 MG SL tablet Place 1 tablet (0.4 mg total) under the tongue every 5 (five) minutes as needed for chest pain. 25 tablet 3  . pantoprazole (PROTONIX) 40 MG tablet TAKE 1 TABLET BY MOUTH EVERY DAY BEFORE BREAKFAST 90 tablet 3   No current facility-administered medications on file prior to visit.    Allergies  Allergen Reactions  . Aggrenox [Aspirin-Dipyridamole Er] Tinitus  . Atorvastatin Other (See Comments)    Myalgias   . Codeine Itching and Other (See Comments)    Nose itches  . Imitrex [Sumatriptan Base] Swelling and Other (See Comments)    Throat swelling  . Isosorbide Nitrate Other (See Comments)    headaches  . Other Other (See Comments)    Artificial sweeteners cause heart palpitations (stevia is ok)  . Rosuvastatin Other (See Comments)    Restless legs  . Simvastatin Other (See Comments)    Myalgias   Social History   Socioeconomic History  . Marital status: Married    Spouse name: Not on file  . Number of children: 1  . Years of education: Not on file  . Highest education level: Not on file  Occupational History  . Occupation: MECHANICAL DESIGNER    Employer: [employer]  Social Needs  . Financial resource strain: Not on file  . Food insecurity:    Worry: Not on file    Inability: Not on file  . Transportation needs:    Medical: Not on file    Non-medical: Not on file  Tobacco Use  . Smoking status: Former Smoker    Packs/day: 0.30    Types: Cigarettes    Last attempt to quit: 03/30/2016    Years since quitting: 1.3  . Smokeless tobacco: Never Used  Substance and Sexual Activity  . Alcohol use: Yes    Comment: occasional; 1 per day  . Drug use: No  . Sexual activity: Not on file  Lifestyle  . Physical activity:    Days per week: Not on file    Minutes per session: Not on file  . Stress: Not on file  Relationships  . Social connections:    Talks on phone: Not on file    Gets together: Not on  file    Attends religious service: Not on file    Active member of club or organization: Not on file    Attends meetings of clubs or organizations: Not on file    Relationship status: Not on file  . Intimate partner violence:    Fear of current or ex partner: Not on  file    Emotionally abused: Not on file    Physically abused: Not on file    Forced sexual activity: Not on file  Other Topics Concern  . Not on file  Social History Narrative   She is married with 1 son   She is a Tax inspector at a factory   Former smoker   Alcohol is used at least once a week glass of wine   Denies drug use      Review of Systems  All other systems reviewed and are negative.      Objective:   Physical Exam  Neck: No thyromegaly present.  Cardiovascular: Normal rate, regular rhythm and normal heart sounds.  Pulmonary/Chest: Effort normal and breath sounds normal. No respiratory distress. She has no wheezes. She has no rales.  Musculoskeletal:       Right shoulder: She exhibits decreased range of motion, tenderness, pain and decreased strength. She exhibits no bony tenderness.  Vitals reviewed.         Assessment & Plan:  Chronic fatigue - Plan: Vitamin B12, TSH, Sedimentation rate, CBC with Differential/Platelet, COMPLETE METABOLIC PANEL WITH GFR  Acute pain of right shoulder  Using sterile technique, I injected the right shoulder with 2 cc of lidocaine, 2 cc of Marcaine, and 2 cc of 40 mg/mL Kenalog.  Patient tolerated procedure well.  If pain does not improve a cortisone injection, I would recommend an MRI of the shoulder to evaluate for rotator cuff tear.  Given her chronic fatigue, I will begin the workup by obtaining baseline laboratory values including a CBC to monitor for anemia or any bone marrow abnormalities, CMP to evaluate for electrolyte dysfunctions or renal or hepatic disease.  I will check a TSH to evaluate for hypothyroidism.  I will check a vitamin B12 to evaluate  for vitamin deficiency.  I will check a cortisol level to evaluate for adrenal insufficiency.  If labs are completely normal, I will also check a sed rate to evaluate for autoimmune diseases.  If this is normal, I would begin a workup for malignancy starting with regular mammogram, colonoscopy, Pap smear, and CXR.

## 2017-08-13 NOTE — Telephone Encounter (Signed)
The metoprolol also has benefit in protecting her from paroxysmal atrial tachycardia, which has been a problem in the past.  I would favor cutting back on 1 of her other blood pressure medicines.  For example: cut her amlodipine in half, down to 5 mg once daily.  Please call us back with some blood pressure readings in about a week's time.

## 2017-08-13 NOTE — Telephone Encounter (Signed)
Spoke with Pt and advised of Dr. Victorino December recommendations. Pt verbalized understanding. Med list updated with changes.

## 2017-08-13 NOTE — Telephone Encounter (Signed)
New message:     Pt is wanting to discuss a beta blocker

## 2017-08-14 LAB — CBC WITH DIFFERENTIAL/PLATELET
BASOS ABS: 63 {cells}/uL (ref 0–200)
Basophils Relative: 0.7 %
EOS PCT: 1 %
Eosinophils Absolute: 90 cells/uL (ref 15–500)
HEMATOCRIT: 53.7 % — AB (ref 35.0–45.0)
HEMOGLOBIN: 18.4 g/dL — AB (ref 11.7–15.5)
LYMPHS ABS: 3087 {cells}/uL (ref 850–3900)
MCH: 30.1 pg (ref 27.0–33.0)
MCHC: 34.3 g/dL (ref 32.0–36.0)
MCV: 87.7 fL (ref 80.0–100.0)
MPV: 10.8 fL (ref 7.5–12.5)
Monocytes Relative: 8.6 %
NEUTROS PCT: 55.4 %
Neutro Abs: 4986 cells/uL (ref 1500–7800)
Platelets: 291 10*3/uL (ref 140–400)
RBC: 6.12 10*6/uL — AB (ref 3.80–5.10)
RDW: 13.4 % (ref 11.0–15.0)
Total Lymphocyte: 34.3 %
WBC: 9 10*3/uL (ref 3.8–10.8)
WBCMIX: 774 {cells}/uL (ref 200–950)

## 2017-08-14 LAB — TSH: TSH: 3.64 mIU/L

## 2017-08-14 LAB — SEDIMENTATION RATE: SED RATE: 2 mm/h (ref 0–30)

## 2017-08-14 LAB — COMPLETE METABOLIC PANEL WITH GFR
AG RATIO: 1.7 (calc) (ref 1.0–2.5)
ALBUMIN MSPROF: 4.5 g/dL (ref 3.6–5.1)
ALT: 63 U/L — ABNORMAL HIGH (ref 6–29)
AST: 41 U/L — ABNORMAL HIGH (ref 10–35)
Alkaline phosphatase (APISO): 81 U/L (ref 33–130)
BUN: 9 mg/dL (ref 7–25)
CALCIUM: 10 mg/dL (ref 8.6–10.4)
CO2: 31 mmol/L (ref 20–32)
CREATININE: 0.74 mg/dL (ref 0.50–1.05)
Chloride: 100 mmol/L (ref 98–110)
GFR, EST AFRICAN AMERICAN: 106 mL/min/{1.73_m2} (ref >=60)
GFR, EST NON AFRICAN AMERICAN: 92 mL/min/{1.73_m2} (ref >=60)
GLOBULIN: 2.6 g/dL (ref 1.9–3.7)
Glucose, Bld: 94 mg/dL (ref 65–99)
POTASSIUM: 4.8 mmol/L (ref 3.5–5.3)
SODIUM: 137 mmol/L (ref 135–146)
Total Bilirubin: 0.6 mg/dL (ref 0.2–1.2)
Total Protein: 7.1 g/dL (ref 6.1–8.1)

## 2017-08-14 LAB — VITAMIN B12: Vitamin B-12: 425 pg/mL (ref 200–1100)

## 2017-08-15 ENCOUNTER — Other Ambulatory Visit: Payer: Self-pay | Admitting: Family Medicine

## 2017-08-15 DIAGNOSIS — D751 Secondary polycythemia: Secondary | ICD-10-CM

## 2017-08-22 ENCOUNTER — Inpatient Hospital Stay: Payer: 59

## 2017-08-22 ENCOUNTER — Other Ambulatory Visit: Payer: Self-pay

## 2017-08-22 ENCOUNTER — Ambulatory Visit
Admission: RE | Admit: 2017-08-22 | Discharge: 2017-08-22 | Disposition: A | Discharge status: Home / Self Care | Payer: 59 | Source: Ambulatory Visit | Attending: Oncology | Admitting: Oncology

## 2017-08-22 ENCOUNTER — Inpatient Hospital Stay: Payer: 59 | Attending: Oncology | Admitting: Oncology

## 2017-08-22 ENCOUNTER — Encounter: Payer: Self-pay | Admitting: Oncology

## 2017-08-22 VITALS — BP 133/93 | HR 84 | Temp 97.7°F | Resp 18 | Wt 182.3 lb

## 2017-08-22 DIAGNOSIS — I10 Essential (primary) hypertension: Secondary | ICD-10-CM | POA: Diagnosis not present

## 2017-08-22 DIAGNOSIS — Z87891 Personal history of nicotine dependence: Secondary | ICD-10-CM | POA: Insufficient documentation

## 2017-08-22 DIAGNOSIS — K219 Gastro-esophageal reflux disease without esophagitis: Secondary | ICD-10-CM | POA: Insufficient documentation

## 2017-08-22 DIAGNOSIS — F329 Major depressive disorder, single episode, unspecified: Secondary | ICD-10-CM | POA: Insufficient documentation

## 2017-08-22 DIAGNOSIS — K2270 Barrett's esophagus without dysplasia: Secondary | ICD-10-CM | POA: Insufficient documentation

## 2017-08-22 DIAGNOSIS — Z79899 Other long term (current) drug therapy: Secondary | ICD-10-CM | POA: Insufficient documentation

## 2017-08-22 DIAGNOSIS — Z8673 Personal history of transient ischemic attack (TIA), and cerebral infarction without residual deficits: Secondary | ICD-10-CM | POA: Diagnosis not present

## 2017-08-22 DIAGNOSIS — D751 Secondary polycythemia: Secondary | ICD-10-CM

## 2017-08-22 DIAGNOSIS — R5383 Other fatigue: Secondary | ICD-10-CM | POA: Diagnosis not present

## 2017-08-22 DIAGNOSIS — J45909 Unspecified asthma, uncomplicated: Secondary | ICD-10-CM | POA: Diagnosis not present

## 2017-08-22 DIAGNOSIS — I2510 Atherosclerotic heart disease of native coronary artery without angina pectoris: Secondary | ICD-10-CM | POA: Insufficient documentation

## 2017-08-22 DIAGNOSIS — F419 Anxiety disorder, unspecified: Secondary | ICD-10-CM | POA: Insufficient documentation

## 2017-08-22 DIAGNOSIS — I252 Old myocardial infarction: Secondary | ICD-10-CM | POA: Insufficient documentation

## 2017-08-22 DIAGNOSIS — E785 Hyperlipidemia, unspecified: Secondary | ICD-10-CM | POA: Insufficient documentation

## 2017-08-22 DIAGNOSIS — I639 Cerebral infarction, unspecified: Secondary | ICD-10-CM | POA: Diagnosis not present

## 2017-08-22 LAB — CBC WITH DIFFERENTIAL/PLATELET
BASOS PCT: 1 %
Basophils Absolute: 0.1 10*3/uL (ref 0–0.1)
EOS ABS: 0 10*3/uL (ref 0–0.7)
EOS PCT: 0 %
HCT: 52.5 % — ABNORMAL HIGH (ref 35.0–47.0)
HEMOGLOBIN: 17.9 g/dL — AB (ref 12.0–16.0)
Lymphocytes Relative: 20 %
Lymphs Abs: 2.3 10*3/uL (ref 1.0–3.6)
MCH: 30.5 pg (ref 26.0–34.0)
MCHC: 34 g/dL (ref 32.0–36.0)
MCV: 89.6 fL (ref 80.0–100.0)
MONOS PCT: 8 %
Monocytes Absolute: 0.9 10*3/uL (ref 0.2–0.9)
NEUTROS PCT: 71 %
Neutro Abs: 8.2 10*3/uL — ABNORMAL HIGH (ref 1.4–6.5)
Platelets: 293 10*3/uL (ref 150–440)
RBC: 5.86 MIL/uL — ABNORMAL HIGH (ref 3.80–5.20)
RDW: 14.6 % — ABNORMAL HIGH (ref 11.5–14.5)
WBC: 11.5 10*3/uL — AB (ref 3.6–11.0)

## 2017-08-22 LAB — COMPREHENSIVE METABOLIC PANEL
ALBUMIN: 4.1 g/dL (ref 3.5–5.0)
ALK PHOS: 62 U/L (ref 38–126)
ALT: 107 U/L — ABNORMAL HIGH (ref 14–54)
ANION GAP: 9 (ref 5–15)
AST: 63 U/L — ABNORMAL HIGH (ref 15–41)
BUN: 24 mg/dL — ABNORMAL HIGH (ref 6–20)
CALCIUM: 9.4 mg/dL (ref 8.9–10.3)
CO2: 26 mmol/L (ref 22–32)
Chloride: 102 mmol/L (ref 101–111)
Creatinine, Ser: 0.72 mg/dL (ref 0.44–1.00)
GFR calc non Af Amer: >60 mL/min (ref >60)
GLUCOSE: 111 mg/dL — AB (ref 65–99)
POTASSIUM: 4.6 mmol/L (ref 3.5–5.1)
SODIUM: 137 mmol/L (ref 135–145)
Total Bilirubin: 0.6 mg/dL (ref 0.3–1.2)
Total Protein: 7.3 g/dL (ref 6.5–8.1)

## 2017-08-22 LAB — URINALYSIS, COMPLETE (UACMP) WITH MICROSCOPIC
BACTERIA UA: NONE SEEN
BILIRUBIN URINE: NEGATIVE
Glucose, UA: NEGATIVE mg/dL
HGB URINE DIPSTICK: NEGATIVE
KETONES UR: NEGATIVE mg/dL
LEUKOCYTES UA: NEGATIVE
Nitrite: NEGATIVE
Protein, ur: NEGATIVE mg/dL
SQUAMOUS EPITHELIAL / LPF: NONE SEEN (ref 0–5)
Specific Gravity, Urine: 1.027 (ref 1.005–1.030)
pH: 5 (ref 5.0–8.0)

## 2017-08-22 NOTE — Progress Notes (Signed)
New patient with polycythemia reports having extreme fatigue for the last 3-4 months.

## 2017-08-23 ENCOUNTER — Encounter: Payer: Self-pay | Admitting: Oncology

## 2017-08-23 LAB — ERYTHROPOIETIN: Erythropoietin: 1.5 m[IU]/mL — ABNORMAL LOW (ref 2.6–18.5)

## 2017-08-23 NOTE — Progress Notes (Signed)
Hematology/Oncology Consult note [facility] Telephone:([phone] [phone]  Patient Care Team: Susy Frizzle, MD as PCP - General (Family Medicine) Pixie Casino, MD as PCP - Cardiology (Cardiology)   Name of the patient: Janet [name]  [id number]  04/13/1966    Reason for referral- polycythemia   Referring physician- Dr. Dennard Schaumann  Date of visit: 08/23/17   History of presenting illness-patient is a 52 year old female who was an ex-smoker and smoked about half to 1 pack of cigarettes per day for about years and quit smoking about a year ago.  She has been referred to [place] for evaluation of polycythemia.35 patient has had a chronically elevated hemoglobin and back in 2015 her H&H was 16.2/48.1.  Most recently in January 2019 her H&H was 17.1/49.2 then 18.4/53.7 in April 2019.  Her main complaint today is excessive fatigue.  She reports a slight lacy rash in the medial aspect of her left knee.  She denies any blood in her urine.  Reports that her appetite is good and she denies any unintentional weight loss.  She does not do any anabolic steroids.  She reports getting a good night sleep and wakes up refreshed in the morning.  She does not remember being told that she snores at night.  She does not have any known chronic lung disease.  ECOG PS- 0  Pain scale- 0   Review of systems- Review of Systems  Constitutional: Positive for malaise/fatigue. Negative for chills, fever and weight loss.  HENT: Negative for congestion, ear discharge and nosebleeds.   Eyes: Negative for blurred vision.  Respiratory: Negative for cough, hemoptysis, sputum production, shortness of breath and wheezing.   Cardiovascular: Negative for chest pain, palpitations, orthopnea and claudication.  Gastrointestinal: Negative for abdominal pain, blood in stool, constipation, diarrhea, heartburn, melena, nausea and vomiting.  Genitourinary: Negative for dysuria, flank pain,  frequency, hematuria and urgency.  Musculoskeletal: Negative for back pain, joint pain and myalgias.  Skin: Positive for rash.  Neurological: Negative for dizziness, tingling, focal weakness, seizures, weakness and headaches.  Endo/Heme/Allergies: Does not bruise/bleed easily.  Psychiatric/Behavioral: Negative for depression and suicidal ideas. The patient does not have insomnia.     Allergies  Allergen Reactions  . Aggrenox [Aspirin-Dipyridamole Er] Tinitus  . Atorvastatin Other (See Comments)    Myalgias   . Codeine Itching and Other (See Comments)    Nose itches  . Imitrex [Sumatriptan Base] Swelling and Other (See Comments)    Throat swelling  . Isosorbide Nitrate Other (See Comments)    headaches  . Other Other (See Comments)    Artificial sweeteners cause heart palpitations (stevia is ok)  . Rosuvastatin Other (See Comments)    Restless legs  . Simvastatin Other (See Comments)    Myalgias    Patient Active Problem List   Diagnosis Date Noted  . PAT (paroxysmal atrial tachycardia) ([place]) 07/25/2017  . Racing heart beat 06/04/2017  . Unstable angina ([place]) 05/15/2017  . Coronary artery disease involving native coronary artery of native heart without angina pectoris 04/18/2017  . Barrett's esophagus 02/09/2014  . History of adenomatous polyp of colon 02/09/2014  . Nonalcoholic steatohepatitis (NASH) - with fibrosis 12/11/2013  . Gastroesophageal reflux disease without esophagitis 12/11/2013  . Nausea alone 12/11/2013  . Focal nodular hyperplasia of liver 12/11/2013  . Anxiety   . Transient cerebral ischemia 08/08/2012  . Palpitations 08/02/2010  . SHORTNESS OF BREATH 07/11/2009  . FATIGUE / MALAISE 05/30/2009  . CHEST PAIN UNSPECIFIED 05/30/2009  .  Hyperlipidemia with target LDL less than 70 07/26/2008  . TOBACCO ABUSE 07/26/2008  . CLUSTER HEADACHE SYNDROME UNSPECIFIED 07/26/2008  . Essential hypertension 07/26/2008  . Coronary atherosclerosis 07/26/2008      Past Medical History:  Diagnosis Date  . Adenoma of right adrenal gland    Benign  . Allergy    SEASONAL  . Anemia   . Anxiety   . Arthritis   . Asthma   . Barrett's esophagus 02/09/2014  . CAD (coronary artery disease)    a. prior MI in 2005 with 60-70% dLAD stenosis and 60-70% D2 stenosis (concern for coronary vasospasm)  . Depression   . Dizziness   . Elevated LFTs   . Focal nodular hyperplasia of liver   . GERD (gastroesophageal reflux disease)   . GERD (gastroesophageal reflux disease)   . Heart murmur    AS A CHILD  . Hepatic steatosis   . History of adenomatous polyp of colon 02/09/2014  . HLD (hyperlipidemia)   . HTN (hypertension)   . Liver hemangioma   . Migraine   . Migraine headache   . Myocardial infarction ([place])   . Nonalcoholic steatohepatitis (NASH) 12/11/2013   sees Roosevelt Locks at [facility]  . Palpitations   . Sinus complaint   . Stroke [facility])      Past Surgical History:  Procedure Laterality Date  . ABDOMINOPLASTY    . BLADDER SUSPENSION    . CERVICAL FUSION     C4, 5, 6  . COLONOSCOPY W/ BIOPSIES    . ESOPHAGOGASTRODUODENOSCOPY    . LEFT HEART CATH AND CORONARY ANGIOGRAPHY N/A 05/16/2017   Procedure: LEFT HEART CATH AND CORONARY ANGIOGRAPHY;  Surgeon: Troy Sine, MD;  Location: [facility];  Service: Cardiovascular;  Laterality: N/A;  . TONSILLECTOMY    . TUBAL LIGATION      Social History   Socioeconomic History  . Marital status: Married    Spouse name: Not on file  . Number of children: 1  . Years of education: Not on file  . Highest education level: Not on file  Occupational History  . Occupation: MECHANICAL DESIGNER    Employer: [employer]  Social Needs  . Financial resource strain: Not on file  . Food insecurity:    Worry: Not on file    Inability: Not on file  . Transportation needs:    Medical: Not on file    Non-medical: Not on file  Tobacco Use  . Smoking status: Former Smoker     Packs/day: 0.30    Types: Cigarettes    Last attempt to quit: 03/30/2016    Years since quitting: 1.4  . Smokeless tobacco: Never Used  Substance and Sexual Activity  . Alcohol use: Yes    Comment: occasional; 1 per day  . Drug use: No  . Sexual activity: Not on file  Lifestyle  . Physical activity:    Days per week: Not on file    Minutes per session: Not on file  . Stress: Not on file  Relationships  . Social connections:    Talks on phone: Not on file    Gets together: Not on file    Attends religious service: Not on file    Active member of club or organization: Not on file    Attends meetings of clubs or organizations: Not on file    Relationship status: Not on file  . Intimate partner violence:    Fear of current or ex  partner: Not on file    Emotionally abused: Not on file    Physically abused: Not on file    Forced sexual activity: Not on file  Other Topics Concern  . Not on file  Social History Narrative   She is married with 1 son   She is a Tax inspector at a factory   Former smoker   Alcohol is used at least once a week glass of wine   Denies drug use     Family History  Problem Relation Age of Onset  . Coronary artery disease Father   . Aneurysm Father        AAA  . Melanoma Father   . Transient ischemic attack Father   . Heart disease Father   . AAA (abdominal aortic aneurysm) Father   . Aneurysm Mother        AAA  . Colon polyps Mother   . Heart disease Mother   . Irritable bowel syndrome Mother   . Diverticulitis Mother   . Other Mother        Evlyn Clines  . AAA (abdominal aortic aneurysm) Mother   . Brain cancer Mother   . Breast cancer Paternal Aunt   . Liver disease Maternal Uncle   . Heart disease Brother   . Prostate cancer Paternal Uncle   . Mesothelioma Paternal Uncle      Current Outpatient Medications:  .  amLODipine (NORVASC) 10 MG tablet, Take 5 mg by mouth daily., Disp: , Rfl:  .  aspirin EC 81 MG tablet, Take 81 mg  by mouth daily., Disp: , Rfl:  .  benazepril (LOTENSIN) 20 MG tablet, TAKE 1 TABLET BY MOUTH EVERY DAY, Disp: 90 tablet, Rfl: 2 .  cetirizine (ZYRTEC) 10 MG tablet, Take 1 tablet (10 mg total) by mouth daily as needed (seasonal allergies)., Disp: 30 tablet, Rfl: 3 .  clopidogrel (PLAVIX) 75 MG tablet, TAKE 1 TABLET (75 MG TOTAL) BY MOUTH DAILY., Disp: 90 tablet, Rfl: 2 .  ezetimibe (ZETIA) 10 MG tablet, Take 1 tablet (10 mg total) by mouth daily., Disp: 90 tablet, Rfl: 3 .  ibuprofen (ADVIL,MOTRIN) 200 MG tablet, Take 400 mg by mouth 2 (two) times daily as needed for headache or moderate pain. , Disp: , Rfl:  .  metoprolol succinate (TOPROL XL) 25 MG 24 hr tablet, Take 0.5 tablets (12.5 mg total) by mouth daily., Disp: 45 tablet, Rfl: 3 .  nitroGLYCERIN (NITROSTAT) 0.4 MG SL tablet, Place 1 tablet (0.4 mg total) under the tongue every 5 (five) minutes as needed for chest pain., Disp: 25 tablet, Rfl: 3 .  pantoprazole (PROTONIX) 40 MG tablet, TAKE 1 TABLET BY MOUTH EVERY DAY BEFORE BREAKFAST, Disp: 90 tablet, Rfl: 3 .  butorphanol (STADOL) 10 MG/ML nasal spray, INSTILL 1 SPRAY INTO ONE NOSTRIL ONCE DAILY IF NEEDED (Patient not taking: Reported on 08/22/2017), Disp: 2.5 mL, Rfl: 0 .  fluticasone (FLONASE) 50 MCG/ACT nasal spray, PLACE 2 SPRAYS INTO BOTH NOSTRILS DAILY AS NEEDED FOR SEASONAL ALLERGIES (Patient not taking: Reported on 08/22/2017), Disp: 16 g, Rfl: 11 .  gabapentin (NEURONTIN) 300 MG capsule, Take 1 capsule (300 mg total) by mouth 3 (three) times daily. (Patient not taking: Reported on 08/22/2017), Disp: 270 capsule, Rfl: 1 .  HYDROcodone-acetaminophen (NORCO/VICODIN) 5-325 MG tablet, Take 1 tablet by mouth every 6 (six) hours as needed for moderate pain. (Patient not taking: Reported on 08/22/2017), Disp: 30 tablet, Rfl: 0   Physical exam:  Vitals:   08/22/17 1410  BP: (!) 133/93  Pulse: 84  Resp: 18  Temp: 97.7 F (36.5 C)  TempSrc: Tympanic  SpO2: 96%  Weight: 182 lb 5 oz (82.7  kg)   Physical Exam  Constitutional: She is oriented to person, place, and time. She appears well-developed and well-nourished.  Face and neck appears  flushed and plethoric  HENT:  Head: Normocephalic and atraumatic.  Eyes: Pupils are equal, round, and reactive to light. EOM are normal.  Neck: Normal range of motion.  Cardiovascular: Normal rate, regular rhythm and normal heart sounds.  Pulmonary/Chest: Effort normal and breath sounds normal.  Abdominal: Soft. Bowel sounds are normal.  No palpable splenomegaly  Lymphadenopathy:  No palpable cervical, supraclavicular, axillary or inguinal adenopathy  Neurological: She is alert and oriented to person, place, and time.  Skin: Skin is warm and dry.  Lacy reticular rash ill-defined about 5 cm in diameter seen over the medial aspect of the left knee       CMP Latest Ref Rng & Units 08/22/2017  Glucose 65 - 99 mg/dL 111(H)  BUN 6 - 20 mg/dL 24(H)  Creatinine 0.44 - 1.00 mg/dL 0.72  Sodium 135 - 145 mmol/L 137  Potassium 3.5 - 5.1 mmol/L 4.6  Chloride 101 - 111 mmol/L 102  CO2 22 - 32 mmol/L 26  Calcium 8.9 - 10.3 mg/dL 9.4  Total Protein 6.5 - 8.1 g/dL 7.3  Total Bilirubin 0.3 - 1.2 mg/dL 0.6  Alkaline Phos 38 - 126 U/L 62  AST 15 - 41 U/L 63(H)  ALT 14 - 54 U/L 107(H)   CBC Latest Ref Rng & Units 08/22/2017  WBC 3.6 - 11.0 K/uL 11.5(H)  Hemoglobin 12.0 - 16.0 g/dL 17.9(H)  Hematocrit 35.0 - 47.0 % 52.5(H)  Platelets 150 - 440 K/uL 293    No images are attached to the encounter.  Dg Chest 2 View  Result Date: 08/23/2017 CLINICAL DATA:  Polycythemia, extreme fatigue for 3-4 months, coronary artery disease post MI, stroke, former smoker EXAM: CHEST - 2 VIEW COMPARISON:  11/27/2014 FINDINGS: Upper normal heart size. Normal mediastinal contours and pulmonary vascularity. Lungs clear. No pulmonary infiltrate, pleural effusion or pneumothorax. Prior cervical spine fusion. Bones otherwise unremarkable. IMPRESSION: No acute  abnormalities. Electronically Signed   By: Lavonia Dana M.D.   On: 08/23/2017 08:41    Assessment and plan- Patient is a 52 y.o. female referred to [place] for polycythemia  I explained to the patient that polycythemia can be primary secondary to overproduction in the bone marrow or secondaries due to a reactive cause.  Patient has not smoked for the last 1 year and has had a steadily increasing hemoglobin over the last 4 months.  Secondary causes of polycythemia could be smoking, chronic lung disease, tumor associated polycythemia, obstructive sleep apnea in case of meals testosterone associated polycythemia.  Today I will check CBC with differential, CMP, EPO level, Jak 2 mutation testing.  I will also obtain urinalysis and chest x-ray.  Her pulse ox today was 96% on room air.  I will see her back in 2 weeks time to discuss the results of the blood work and further management   Thank you for this kind referral and the opportunity to participate in the care of this patient   Visit Diagnosis 1. Polycythemia     Dr. Randa Evens, MD, MPH [facility] at [facility] [phone] 08/23/2017  9:29 AM

## 2017-08-26 ENCOUNTER — Encounter: Payer: Self-pay | Admitting: Neurology

## 2017-08-26 ENCOUNTER — Telehealth: Payer: Self-pay | Admitting: *Deleted

## 2017-08-26 ENCOUNTER — Other Ambulatory Visit: Payer: Self-pay | Admitting: *Deleted

## 2017-08-26 DIAGNOSIS — D751 Secondary polycythemia: Secondary | ICD-10-CM

## 2017-08-26 LAB — JAK2 GENOTYPR

## 2017-08-26 NOTE — Telephone Encounter (Signed)
-----   Message from Sindy Guadeloupe, MD sent at 08/26/2017 10:47 AM EDT ----- Can she come for labs- exon 12, calr and mpl mutation testing? Thanks, Astrid Divine

## 2017-08-26 NOTE — Telephone Encounter (Signed)
Called the pt and left message that Dr. Janese Banks wanted some additional labs done. The first one she got back is negative and sometimes when it is negative there is additional labs she can check to find cause of elevated hgb.  If pt can call me back and let me know if she is willing to get additonal labs done.  She does have an appt 5/3 to see [place] and if she wants to wait then just let me know. I left my number for her to call me back

## 2017-08-27 NOTE — Telephone Encounter (Signed)
We can just get those labs on 5/3

## 2017-08-28 NOTE — Telephone Encounter (Signed)
The patient called back and said that she would be able to get labs when she comes this Friday. I have entered labs .

## 2017-08-30 ENCOUNTER — Inpatient Hospital Stay: Payer: 59

## 2017-08-30 ENCOUNTER — Encounter: Payer: Self-pay | Admitting: Oncology

## 2017-08-30 ENCOUNTER — Inpatient Hospital Stay: Payer: 59 | Attending: Oncology | Admitting: Oncology

## 2017-08-30 VITALS — BP 124/86 | HR 75 | Temp 97.5°F | Resp 18 | Ht 68.0 in | Wt 186.8 lb

## 2017-08-30 DIAGNOSIS — Z8673 Personal history of transient ischemic attack (TIA), and cerebral infarction without residual deficits: Secondary | ICD-10-CM | POA: Diagnosis not present

## 2017-08-30 DIAGNOSIS — E785 Hyperlipidemia, unspecified: Secondary | ICD-10-CM | POA: Diagnosis not present

## 2017-08-30 DIAGNOSIS — K2270 Barrett's esophagus without dysplasia: Secondary | ICD-10-CM | POA: Insufficient documentation

## 2017-08-30 DIAGNOSIS — F419 Anxiety disorder, unspecified: Secondary | ICD-10-CM | POA: Insufficient documentation

## 2017-08-30 DIAGNOSIS — F329 Major depressive disorder, single episode, unspecified: Secondary | ICD-10-CM | POA: Insufficient documentation

## 2017-08-30 DIAGNOSIS — K219 Gastro-esophageal reflux disease without esophagitis: Secondary | ICD-10-CM | POA: Diagnosis not present

## 2017-08-30 DIAGNOSIS — D751 Secondary polycythemia: Secondary | ICD-10-CM

## 2017-08-30 DIAGNOSIS — Z87891 Personal history of nicotine dependence: Secondary | ICD-10-CM | POA: Diagnosis not present

## 2017-08-30 DIAGNOSIS — I10 Essential (primary) hypertension: Secondary | ICD-10-CM | POA: Insufficient documentation

## 2017-08-30 DIAGNOSIS — Z79899 Other long term (current) drug therapy: Secondary | ICD-10-CM | POA: Diagnosis not present

## 2017-08-30 DIAGNOSIS — J45909 Unspecified asthma, uncomplicated: Secondary | ICD-10-CM | POA: Diagnosis not present

## 2017-08-30 DIAGNOSIS — I252 Old myocardial infarction: Secondary | ICD-10-CM | POA: Insufficient documentation

## 2017-08-30 DIAGNOSIS — I2510 Atherosclerotic heart disease of native coronary artery without angina pectoris: Secondary | ICD-10-CM | POA: Insufficient documentation

## 2017-08-30 NOTE — Progress Notes (Signed)
Wheezing and coughing from allergies

## 2017-08-30 NOTE — Progress Notes (Signed)
Hematology/Oncology Consult note [facility]  Telephone:([phone] [phone]  Patient Care Team: Susy Frizzle, MD as PCP - General (Family Medicine) Pixie Casino, MD as PCP - Cardiology (Cardiology)   Name of the patient: Janet [name]  [id number]  10-20-1965   Date of visit: 08/30/17  Diagnosis- polycythemia of unclear etiology  Chief complaint/ Reason for visit- discuss results of bloodwork  Heme/Onc history: patient is a 52 year old female who was an ex-smoker and smoked about half to 1 pack of cigarettes per day for about years and quit smoking about a year ago.  She has been referred to [place] for evaluation of polycythemia.35 patient has had a chronically elevated hemoglobin and back in 2015 her H&H was 16.2/48.1.  Most recently in January 2019 her H&H was 17.1/49.2 then 18.4/53.7 in April 2019.  Her main complaint today is excessive fatigue.  She reports a slight lacy rash in the medial aspect of her left knee.  She denies any blood in her urine.  Reports that her appetite is good and she denies any unintentional weight loss.  She does not do any anabolic steroids.  She reports getting a good night sleep and wakes up refreshed in the morning.  She does not remember being told that she snores at night.  She does not have any known chronic lung disease.  Results of blood work from 08/22/2017 were as follows: CBC showed white count of 11.5, H&H of 17.9/52.5 with an MCV of 89.6 and a platelet count of 293.  E Po level was low at 1.5.  Jak 2 mutation testing was negative.  Urinalysis did not reveal any hematuria.  CMP showed elevated AST and ALT of 63 and 107 respectively.   Interval history-patient reports some mild chronic fatigue but denies any other complaints today  ECOG PS- 0 Pain scale- 0   Review of systems- Review of Systems  Constitutional: Positive for malaise/fatigue. Negative for chills, fever and weight loss.  HENT:  Negative for congestion, ear discharge and nosebleeds.   Eyes: Negative for blurred vision.  Respiratory: Negative for cough, hemoptysis, sputum production, shortness of breath and wheezing.   Cardiovascular: Negative for chest pain, palpitations, orthopnea and claudication.  Gastrointestinal: Negative for abdominal pain, blood in stool, constipation, diarrhea, heartburn, melena, nausea and vomiting.  Genitourinary: Negative for dysuria, flank pain, frequency, hematuria and urgency.  Musculoskeletal: Negative for back pain, joint pain and myalgias.  Skin: Negative for rash.  Neurological: Negative for dizziness, tingling, focal weakness, seizures, weakness and headaches.  Endo/Heme/Allergies: Does not bruise/bleed easily.  Psychiatric/Behavioral: Negative for depression and suicidal ideas. The patient does not have insomnia.       Allergies  Allergen Reactions  . Aggrenox [Aspirin-Dipyridamole Er] Tinitus  . Atorvastatin Other (See Comments)    Myalgias   . Codeine Itching and Other (See Comments)    Nose itches  . Imitrex [Sumatriptan Base] Swelling and Other (See Comments)    Throat swelling  . Isosorbide Nitrate Other (See Comments)    headaches  . Other Other (See Comments)    Artificial sweeteners cause heart palpitations (stevia is ok)  . Rosuvastatin Other (See Comments)    Restless legs  . Simvastatin Other (See Comments)    Myalgias     Past Medical History:  Diagnosis Date  . Adenoma of right adrenal gland    Benign  . Allergy    SEASONAL  . Anemia   . Anxiety   . Arthritis   .  Asthma   . Barrett's esophagus 02/09/2014  . CAD (coronary artery disease)    a. prior MI in 2005 with 60-70% dLAD stenosis and 60-70% D2 stenosis (concern for coronary vasospasm)  . Depression   . Dizziness   . Elevated LFTs   . Focal nodular hyperplasia of liver   . GERD (gastroesophageal reflux disease)   . GERD (gastroesophageal reflux disease)   . Heart murmur    AS A CHILD   . Hepatic steatosis   . History of adenomatous polyp of colon 02/09/2014  . HLD (hyperlipidemia)   . HTN (hypertension)   . Liver hemangioma   . Migraine   . Migraine headache   . Myocardial infarction ([place])   . Nonalcoholic steatohepatitis ([facility]) 12/11/2013   sees [facility] at [facility]  . Palpitations   . Sinus complaint   . Stroke [facility])      Past Surgical History:  Procedure Laterality Date  . ABDOMINOPLASTY    . BLADDER SUSPENSION    . CERVICAL FUSION     C4, 5, 6  . COLONOSCOPY W/ BIOPSIES    . ESOPHAGOGASTRODUODENOSCOPY    . LEFT HEART CATH AND CORONARY ANGIOGRAPHY N/A 05/16/2017   Procedure: LEFT HEART CATH AND CORONARY ANGIOGRAPHY;  Surgeon: Troy Sine, MD;  Location: [place] [facility];  Service: Cardiovascular;  Laterality: N/A;  . TONSILLECTOMY    . TUBAL LIGATION      Social History   Socioeconomic History  . Marital status: Married    Spouse name: Not on file  . Number of children: 1  . Years of education: Not on file  . Highest education level: Not on file  Occupational History  . Occupation: MECHANICAL DESIGNER    Employer: [employer]  Social Needs  . Financial resource strain: Not on file  . Food insecurity:    Worry: Not on file    Inability: Not on file  . Transportation needs:    Medical: Not on file    Non-medical: Not on file  Tobacco Use  . Smoking status: Former Smoker    Packs/day: 0.30    Types: Cigarettes    Last attempt to quit: 03/30/2016    Years since quitting: 1.4  . Smokeless tobacco: Never Used  Substance and Sexual Activity  . Alcohol use: Yes    Comment: occasional; 1 per day  . Drug use: No  . Sexual activity: Not on file  Lifestyle  . Physical activity:    Days per week: Not on file    Minutes per session: Not on file  . Stress: Not on file  Relationships  . Social connections:    Talks on phone: Not on file    Gets together: Not on file    Attends religious service: Not on file     Active member of club or organization: Not on file    Attends meetings of clubs or organizations: Not on file    Relationship status: Not on file  . Intimate partner violence:    Fear of current or ex partner: Not on file    Emotionally abused: Not on file    Physically abused: Not on file    Forced sexual activity: Not on file  Other Topics Concern  . Not on file  Social History Narrative   She is married with 1 son   She is a Tax inspector at a factory   Former smoker   Alcohol is used at least once  a week glass of wine   Denies drug use    Family History  Problem Relation Age of Onset  . Coronary artery disease Father   . Aneurysm Father        AAA  . Melanoma Father   . Transient ischemic attack Father   . Heart disease Father   . AAA (abdominal aortic aneurysm) Father   . Aneurysm Mother        AAA  . Colon polyps Mother   . Heart disease Mother   . Irritable bowel syndrome Mother   . Diverticulitis Mother   . Other Mother        Evlyn Clines  . AAA (abdominal aortic aneurysm) Mother   . Brain cancer Mother   . Breast cancer Paternal Aunt   . Liver disease Maternal Uncle   . Heart disease Brother   . Prostate cancer Paternal Uncle   . Mesothelioma Paternal Uncle      Current Outpatient Medications:  .  amLODipine (NORVASC) 10 MG tablet, Take 5 mg by mouth daily., Disp: , Rfl:  .  aspirin EC 81 MG tablet, Take 81 mg by mouth daily., Disp: , Rfl:  .  cetirizine (ZYRTEC) 10 MG tablet, Take 1 tablet (10 mg total) by mouth daily as needed (seasonal allergies)., Disp: 30 tablet, Rfl: 3 .  clopidogrel (PLAVIX) 75 MG tablet, TAKE 1 TABLET (75 MG TOTAL) BY MOUTH DAILY., Disp: 90 tablet, Rfl: 2 .  ezetimibe (ZETIA) 10 MG tablet, Take 1 tablet (10 mg total) by mouth daily., Disp: 90 tablet, Rfl: 3 .  ibuprofen (ADVIL,MOTRIN) 200 MG tablet, Take 400 mg by mouth 2 (two) times daily as needed for headache or moderate pain. , Disp: , Rfl:  .  metoprolol succinate  (TOPROL XL) 25 MG 24 hr tablet, Take 0.5 tablets (12.5 mg total) by mouth daily., Disp: 45 tablet, Rfl: 3 .  pantoprazole (PROTONIX) 40 MG tablet, TAKE 1 TABLET BY MOUTH EVERY DAY BEFORE BREAKFAST, Disp: 90 tablet, Rfl: 3 .  benazepril (LOTENSIN) 20 MG tablet, TAKE 1 TABLET BY MOUTH EVERY DAY, Disp: 90 tablet, Rfl: 2 .  butorphanol (STADOL) 10 MG/ML nasal spray, INSTILL 1 SPRAY INTO ONE NOSTRIL ONCE DAILY IF NEEDED (Patient not taking: Reported on 08/22/2017), Disp: 2.5 mL, Rfl: 0 .  fluticasone (FLONASE) 50 MCG/ACT nasal spray, PLACE 2 SPRAYS INTO BOTH NOSTRILS DAILY AS NEEDED FOR SEASONAL ALLERGIES (Patient not taking: Reported on 08/22/2017), Disp: 16 g, Rfl: 11 .  gabapentin (NEURONTIN) 300 MG capsule, Take 1 capsule (300 mg total) by mouth 3 (three) times daily. (Patient not taking: Reported on 08/22/2017), Disp: 270 capsule, Rfl: 1 .  HYDROcodone-acetaminophen (NORCO/VICODIN) 5-325 MG tablet, Take 1 tablet by mouth every 6 (six) hours as needed for moderate pain. (Patient not taking: Reported on 08/22/2017), Disp: 30 tablet, Rfl: 0 .  nitroGLYCERIN (NITROSTAT) 0.4 MG SL tablet, Place 1 tablet (0.4 mg total) under the tongue every 5 (five) minutes as needed for chest pain. (Patient not taking: Reported on 08/30/2017), Disp: 25 tablet, Rfl: 3  Physical exam:  Vitals:   08/30/17 1415  BP: 124/86  Pulse: 75  Resp: 18  Temp: (!) 97.5 F (36.4 C)  TempSrc: Tympanic  SpO2: 97%  Weight: 186 lb 12.8 oz (84.7 kg)  Height: 5' 8"  (1.727 m)   Physical Exam  Constitutional: She is oriented to person, place, and time. She appears well-developed and well-nourished.  ruddy complexion  HENT:  Head: Normocephalic and atraumatic.  Eyes: Pupils  are equal, round, and reactive to light. EOM are normal.  Neck: Normal range of motion.  Cardiovascular: Normal rate, regular rhythm and normal heart sounds.  Pulmonary/Chest: Effort normal and breath sounds normal.  Abdominal: Soft. Bowel sounds are normal.    Neurological: She is alert and oriented to person, place, and time.  Skin: Skin is warm and dry.     CMP Latest Ref Rng & Units 08/22/2017  Glucose 65 - 99 mg/dL 111(H)  BUN 6 - 20 mg/dL 24(H)  Creatinine 0.44 - 1.00 mg/dL 0.72  Sodium 135 - 145 mmol/L 137  Potassium 3.5 - 5.1 mmol/L 4.6  Chloride 101 - 111 mmol/L 102  CO2 22 - 32 mmol/L 26  Calcium 8.9 - 10.3 mg/dL 9.4  Total Protein 6.5 - 8.1 g/dL 7.3  Total Bilirubin 0.3 - 1.2 mg/dL 0.6  Alkaline Phos 38 - 126 U/L 62  AST 15 - 41 U/L 63(H)  ALT 14 - 54 U/L 107(H)   CBC Latest Ref Rng & Units 08/22/2017  WBC 3.6 - 11.0 K/uL 11.5(H)  Hemoglobin 12.0 - 16.0 g/dL 17.9(H)  Hematocrit 35.0 - 47.0 % 52.5(H)  Platelets 150 - 440 K/uL 293    No images are attached to the encounter.  Dg Chest 2 View  Result Date: 08/23/2017 CLINICAL DATA:  Polycythemia, extreme fatigue for 3-4 months, coronary artery disease post MI, stroke, former smoker EXAM: CHEST - 2 VIEW COMPARISON:  11/27/2014 FINDINGS: Upper normal heart size. Normal mediastinal contours and pulmonary vascularity. Lungs clear. No pulmonary infiltrate, pleural effusion or pneumothorax. Prior cervical spine fusion. Bones otherwise unremarkable. IMPRESSION: No acute abnormalities. Electronically Signed   By: Lavonia Dana M.D.   On: 08/23/2017 08:41     Assessment and plan- Patient is a 52 y.o. female with polycythemia of unclear etiology  Patient has polycythemia based on her blood work.  She has now quit smoking for a year and a low EPO level argues against secondary causes of polycythemia such as smoking or tumor associated polycythemia.  Her Jak 2 mutation testing is negative.  Today I will be checking CA LR and MPL mutation as well as carboxyhemoglobin level and exon 12 testing   If the results of these tests are inconclusive I am inclined to perform a bone marrow biopsy to rule out a myeloproliferative disorder given the low E Po levels.  Discussed risks and benefits of bone  marrow biopsy including all but not limited to pain at the site of biopsy and possible chance of infection.  Patient understands and agrees to proceed as planned  I will tentatively see her back second week of June after the results of a bone marrow biopsy are back.  If bone marrow biopsy is not suggestive of a primary myeloproliferative process-she has secondary polycythemia of unclear etiology and at that point I would consider phlebotomy if her hematocrit is greater than 55-60.  However if she has primary polycythemia she would have a lower target of hematocrit at less than 42-45.  Patient is in understanding of the plan   Visit Diagnosis 1. Polycythemia      Dr. Randa Evens, MD, MPH [facility] at [facility] [phone] 08/30/2017 2:41 PM

## 2017-09-02 LAB — CARBON MONOXIDE, BLOOD (PERFORMED AT REF LAB): CARBON MONOXIDE, BLOOD: 9 % — AB (ref 0.0–3.6)

## 2017-09-04 LAB — CALRETICULIN (CALR) MUTATION ANALYSIS

## 2017-09-05 LAB — MPL MUTATION ANALYSIS

## 2017-09-09 ENCOUNTER — Encounter: Payer: Self-pay | Admitting: Internal Medicine

## 2017-09-10 ENCOUNTER — Telehealth: Payer: Self-pay | Admitting: *Deleted

## 2017-09-10 DIAGNOSIS — D751 Secondary polycythemia: Secondary | ICD-10-CM

## 2017-09-10 NOTE — Telephone Encounter (Signed)
Called pt and got voicemail and let her know that pt labs were ok except for one and it is usually due to smoking to make level go up. She does not need bone marrow bx. And she is due to come back on 6/13 at 2 pm. She would like to have lab work on same day so I would like her to come in 1:30. I asked her to call me back and let me know if she is ok coming earlier and I will add additional lab appt on

## 2017-09-11 LAB — JAK2 EXONS 12-15

## 2017-09-12 ENCOUNTER — Telehealth: Payer: Self-pay | Admitting: Internal Medicine

## 2017-09-12 NOTE — Telephone Encounter (Signed)
New Message   Janet Mounts, PhD  Janet Pickett Nadean Corwin, MD  Cc: Janet [name], Janet [name]        Janet [name], I had a note in my Staff messages from you a while ago about having Janet [name] come in for genetics.   Let me know if you would want to pursue this and I'll request Janet, our scheduler, put her on my schedule.   Reagrds  Janet [name]    Please advise

## 2017-09-19 ENCOUNTER — Ambulatory Visit (AMBULATORY_SURGERY_CENTER): Payer: 59 | Admitting: Internal Medicine

## 2017-09-19 ENCOUNTER — Encounter: Payer: Self-pay | Admitting: Internal Medicine

## 2017-09-19 ENCOUNTER — Other Ambulatory Visit: Payer: Self-pay

## 2017-09-19 VITALS — BP 146/94 | HR 66 | Temp 98.2°F | Resp 13 | Ht 68.0 in | Wt 186.0 lb

## 2017-09-19 DIAGNOSIS — K2970 Gastritis, unspecified, without bleeding: Secondary | ICD-10-CM

## 2017-09-19 DIAGNOSIS — R6881 Early satiety: Secondary | ICD-10-CM | POA: Diagnosis not present

## 2017-09-19 DIAGNOSIS — K2950 Unspecified chronic gastritis without bleeding: Secondary | ICD-10-CM | POA: Diagnosis not present

## 2017-09-19 DIAGNOSIS — K2270 Barrett's esophagus without dysplasia: Secondary | ICD-10-CM | POA: Diagnosis not present

## 2017-09-19 DIAGNOSIS — K2990 Gastroduodenitis, unspecified, without bleeding: Secondary | ICD-10-CM | POA: Diagnosis not present

## 2017-09-19 MED ORDER — SODIUM CHLORIDE 0.9 % IV SOLN: 500.0000 mL | Freq: Once | INTRAVENOUS | Status: DC

## 2017-09-19 NOTE — Op Note (Addendum)
[facility] Patient Name: Janet [name] Procedure Date: 09/19/2017 10:23 AM MRN: [id number] Endoscopist: Gatha Mayer , MD Age: 52 Referring MD:  Date of Birth: Sep 12, 1965 Gender: Female Account #: [id number] Procedure:                Upper GI endoscopy Indications:              Dyspepsia, Early satiety Medicines:                Propofol per Anesthesia, Monitored Anesthesia Care Procedure:                Pre-Anesthesia Assessment:                           - Prior to the procedure, a History and Physical                            was performed, and patient medications and                            allergies were reviewed. The patient's tolerance of                            previous anesthesia was also reviewed. The risks                            and benefits of the procedure and the sedation                            options and risks were discussed with the patient.                            All questions were answered, and informed consent                            was obtained. Prior Anticoagulants: The patient                            last took Plavix (clopidogrel) on the day of the                            procedure. ASA Grade Assessment: III - A patient                            with severe systemic disease. After reviewing the                            risks and benefits, the patient was deemed in                            satisfactory condition to undergo the procedure.                           After obtaining informed consent, the endoscope was  passed under direct vision. Throughout the                            procedure, the patient's blood pressure, pulse, and                            oxygen saturations were monitored continuously. The                            Endoscope was introduced through the mouth, and                            advanced to the second part of duodenum. The upper   GI endoscopy was accomplished without difficulty.                            The patient tolerated the procedure well. Scope In: Scope Out: Findings:                 Diffuse mild inflammation characterized by                            congestion (edema), erythema and friability was                            found in the gastric body and in the gastric                            antrum. Biopsies were taken with a cold forceps for                            histology. Verification of patient identification                            for the specimen was done. Estimated blood loss was                            minimal.                           The exam was otherwise without abnormality.                           The cardia and gastric fundus were otherwise normal                            on retroflexion. Complications:            No immediate complications. Estimated Blood Loss:     Estimated blood loss was minimal. Impression:               - Chronic gastritis. Biopsied.                           - The examination was otherwise normal. Previopus  dx Barrett's esophagus - not so based upon topday's                            guidelines - Z line is sharp today - prior dx based                            upon a small erosion at GE junction dx removed Recommendation:           - Patient has a contact number available for                            emergencies. The signs and symptoms of potential                            delayed complications were discussed with the                            patient. Return to normal activities tomorrow.                            Written discharge instructions were provided to the                            patient.                           - Resume previous diet.                           - Continue present medications.                           - Await pathology results. Trial of FD Gard. May                             benefit from buspirone. ? prucalopride since she is                            constipated.                           Discussion in recovery - has IBS-mixed mainly, says                            "anxiety medication might be good" when I reviewed                            possibility of buspirone for dyspepsia Gatha Mayer, MD 09/19/2017 10:41:42 AM This report has been signed electronically.

## 2017-09-19 NOTE — Progress Notes (Signed)
Called to room to assist during endoscopic procedure.  Patient ID and intended procedure confirmed with present staff. Received instructions for my participation in the procedure from the performing physician.  

## 2017-09-19 NOTE — Patient Instructions (Addendum)
I saw stomach inflammation - gastritis - again. Took biopsies to see if any infection causing it.  This is probably causing at least some of your symptoms.  I want you to try an OTC medication called FD Donald Prose - see if that helps - take 2 before meals.  I will contact you about the biopsies.  Based upon current criteria you do not have Barrett's esophagus - removing that diagnosis.  I appreciate the opportunity to care for you. Gatha Mayer, MD, FACG   YOU HAD AN ENDOSCOPIC PROCEDURE TODAY AT [facility]:   Refer to the procedure report that was given to you for any specific questions about what was found during the examination.  If the procedure report does not answer your questions, please call your gastroenterologist to clarify.  If you requested that your care partner not be given the details of your procedure findings, then the procedure report has been included in a sealed envelope for you to review at your convenience later.  YOU SHOULD EXPECT: Some feelings of bloating in the abdomen. Passage of more gas than usual.  Walking can help get rid of the air that was put into your GI tract during the procedure and reduce the bloating. If you had a lower endoscopy (such as a colonoscopy or flexible sigmoidoscopy) you may notice spotting of blood in your stool or on the toilet paper. If you underwent a bowel prep for your procedure, you may not have a normal bowel movement for a few days.  Please Note:  You might notice some irritation and congestion in your nose or some drainage.  This is from the oxygen used during your procedure.  There is no need for concern and it should clear up in a day or so.  SYMPTOMS TO REPORT IMMEDIATELY:    Following upper endoscopy (EGD)  Vomiting of blood or coffee ground material  New chest pain or pain under the shoulder blades  Painful or persistently difficult swallowing  New shortness of breath  Fever of 100F or  higher  Black, tarry-looking stools  For urgent or emergent issues, a gastroenterologist can be reached at any hour by calling [phone].   DIET:  We do recommend a small meal at first, but then you may proceed to your regular diet.  Drink plenty of fluids but you should avoid alcoholic beverages for 24 hours.  ACTIVITY:  You should plan to take it easy for the rest of today and you should NOT DRIVE or use heavy machinery until tomorrow (because of the sedation medicines used during the test).    FOLLOW UP: Our staff will call the number listed on your records the next business day following your procedure to check on you and address any questions or concerns that you may have regarding the information given to you following your procedure. If we do not reach you, we will leave a message.  However, if you are feeling well and you are not experiencing any problems, there is no need to return our call.  We will assume that you have returned to your regular daily activities without incident.  If any biopsies were taken you will be contacted by phone or by letter within the next 1-3 weeks.  Please call us at [phone] if you have not heard about the biopsies in 3 weeks.    SIGNATURES/CONFIDENTIALITY: You and/or your care partner have signed paperwork which will be entered into your electronic medical record.  These signatures attest to the fact that that the information above on your After Visit Summary has been reviewed and is understood.  Full responsibility of the confidentiality of this discharge information lies with you and/or your care-partner.

## 2017-09-20 ENCOUNTER — Telehealth: Payer: Self-pay | Admitting: *Deleted

## 2017-09-20 NOTE — Telephone Encounter (Signed)
She has not been interested in further treatment options - I have follow-up with her in July. We will discuss genetic testing and alternative treatments with her then.  Thanks.  -Mali

## 2017-09-20 NOTE — Telephone Encounter (Signed)
  Follow up Call-  Call back number 09/19/2017  Post procedure Call Back phone  # [phone]  Permission to leave phone message Yes  Some recent data might be hidden     Patient questions:  Do you have a fever, pain , or abdominal swelling? No. Pain Score  0 *  Have you tolerated food without any problems? Yes.    Have you been able to return to your normal activities? Yes.    Do you have any questions about your discharge instructions: Diet   No. Medications  No. Follow up visit  No.  Do you have questions or concerns about your Care? No.  Actions: * If pain score is 4 or above: No action needed, pain <4.

## 2017-09-30 NOTE — Progress Notes (Signed)
[facility] no letter/recall  Office - please contact her and tell her some stomach inflammation - nonspecific - do not think it is causing most or all of sxs  1) Ask her if the [place] helps enough or does she think she needs other tx and let me know

## 2017-10-10 ENCOUNTER — Inpatient Hospital Stay: Payer: 59

## 2017-10-10 ENCOUNTER — Encounter: Payer: Self-pay | Admitting: Oncology

## 2017-10-10 ENCOUNTER — Inpatient Hospital Stay: Payer: 59 | Attending: Oncology | Admitting: Oncology

## 2017-10-10 VITALS — BP 136/98 | HR 71 | Temp 97.0°F | Resp 18 | Ht 68.0 in | Wt 177.0 lb

## 2017-10-10 DIAGNOSIS — K2270 Barrett's esophagus without dysplasia: Secondary | ICD-10-CM | POA: Diagnosis not present

## 2017-10-10 DIAGNOSIS — Z7982 Long term (current) use of aspirin: Secondary | ICD-10-CM | POA: Insufficient documentation

## 2017-10-10 DIAGNOSIS — I2510 Atherosclerotic heart disease of native coronary artery without angina pectoris: Secondary | ICD-10-CM | POA: Insufficient documentation

## 2017-10-10 DIAGNOSIS — Z87891 Personal history of nicotine dependence: Secondary | ICD-10-CM | POA: Insufficient documentation

## 2017-10-10 DIAGNOSIS — E785 Hyperlipidemia, unspecified: Secondary | ICD-10-CM | POA: Diagnosis not present

## 2017-10-10 DIAGNOSIS — D751 Secondary polycythemia: Secondary | ICD-10-CM | POA: Diagnosis present

## 2017-10-10 DIAGNOSIS — F329 Major depressive disorder, single episode, unspecified: Secondary | ICD-10-CM | POA: Insufficient documentation

## 2017-10-10 DIAGNOSIS — F419 Anxiety disorder, unspecified: Secondary | ICD-10-CM | POA: Insufficient documentation

## 2017-10-10 DIAGNOSIS — Z8673 Personal history of transient ischemic attack (TIA), and cerebral infarction without residual deficits: Secondary | ICD-10-CM | POA: Insufficient documentation

## 2017-10-10 DIAGNOSIS — K219 Gastro-esophageal reflux disease without esophagitis: Secondary | ICD-10-CM | POA: Insufficient documentation

## 2017-10-10 DIAGNOSIS — I252 Old myocardial infarction: Secondary | ICD-10-CM | POA: Insufficient documentation

## 2017-10-10 DIAGNOSIS — J45909 Unspecified asthma, uncomplicated: Secondary | ICD-10-CM | POA: Insufficient documentation

## 2017-10-10 DIAGNOSIS — Z79899 Other long term (current) drug therapy: Secondary | ICD-10-CM | POA: Insufficient documentation

## 2017-10-10 DIAGNOSIS — I10 Essential (primary) hypertension: Secondary | ICD-10-CM | POA: Diagnosis not present

## 2017-10-10 LAB — CBC WITH DIFFERENTIAL/PLATELET
BASOS PCT: 1 %
Basophils Absolute: 0.1 10*3/uL (ref 0–0.1)
EOS ABS: 0 10*3/uL (ref 0–0.7)
EOS PCT: 0 %
HCT: 52.7 % — ABNORMAL HIGH (ref 35.0–47.0)
Hemoglobin: 18.3 g/dL — ABNORMAL HIGH (ref 12.0–16.0)
Lymphocytes Relative: 40 %
Lymphs Abs: 3.9 10*3/uL — ABNORMAL HIGH (ref 1.0–3.6)
MCH: 31.4 pg (ref 26.0–34.0)
MCHC: 34.7 g/dL (ref 32.0–36.0)
MCV: 90.5 fL (ref 80.0–100.0)
MONO ABS: 0.8 10*3/uL (ref 0.2–0.9)
Monocytes Relative: 8 %
Neutro Abs: 5 10*3/uL (ref 1.4–6.5)
Neutrophils Relative %: 51 %
PLATELETS: 291 10*3/uL (ref 150–440)
RBC: 5.82 MIL/uL — AB (ref 3.80–5.20)
RDW: 15.3 % — AB (ref 11.5–14.5)
WBC: 9.7 10*3/uL (ref 3.6–11.0)

## 2017-10-10 NOTE — Progress Notes (Signed)
No new changes noted today 

## 2017-10-14 NOTE — Progress Notes (Signed)
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 She has been referred to [place] for evaluation of polycythemia.35patient has had a chronically elevated hemoglobin and back in 2015 her H&H was 16.2/48.1. Most recently in January 2019 her H&H was 17.1/49.2 then 18.4/53.7 in April 2019.Her main complaint today is excessive fatigue. She reports a slight lacy rash in the medial aspect of her left knee. She denies any blood in her urine. Reports that her appetite is good and she denies any unintentional weight loss. She does not do any anabolic steroids. She reports getting a good night sleep and wakes up refreshed in the morning. She does not remember being told that she snores at night. She does not have any known chronic lung disease.  Results of blood work from 08/22/2017 were as follows: CBC showed white count of 11.5, H&H of 17.9/52.5 with an MCV of 89.6 and a platelet count of 293.  E Po level was low at 1.5.  Jak 2 mutation testing was negative.  Urinalysis did not reveal any hematuria.  CMP showed elevated AST and ALT of 63 and 107 respectively. carboxyhb levels were elevated at 9  Interval history- patient states she has not smoked for 9 months now. She is however exposed to passive smoking through her husband. Overall feels well. Denies any complaints  ECOG PS- 0 Pain scale- 0   Review of  systems- Review of Systems  Constitutional: Negative for chills, fever, malaise/fatigue and weight loss.  HENT: Negative for congestion, ear discharge and nosebleeds.   Eyes: Negative for blurred vision.  Respiratory: Negative for cough, hemoptysis, sputum production, shortness of breath and wheezing.   Cardiovascular: Negative for chest pain, palpitations, orthopnea and claudication.  Gastrointestinal: Negative for abdominal pain, blood in stool, constipation, diarrhea, heartburn, melena, nausea and vomiting.  Genitourinary: Negative for dysuria, flank pain, frequency, hematuria and urgency.  Musculoskeletal: Negative for back pain, joint pain and myalgias.  Skin: Negative for rash.  Neurological: Negative for dizziness, tingling, focal weakness, seizures, weakness and headaches.  Endo/Heme/Allergies: Does not bruise/bleed easily.  Psychiatric/Behavioral: Negative for depression and suicidal ideas. The patient does not have insomnia.      Allergies  Allergen Reactions  . Aggrenox [Aspirin-Dipyridamole Er] Tinitus  . Atorvastatin Other (See Comments)    Myalgias   . Codeine Itching and Other (See Comments)    Nose itches  . Imitrex [Sumatriptan Base] Swelling and Other (See Comments)    Throat swelling  . Isosorbide Nitrate Other (See Comments)    headaches  . Other Other (See Comments)    Artificial sweeteners cause heart palpitations (stevia is ok)  . Rosuvastatin Other (See Comments)    Restless legs  . Simvastatin Other (See Comments)    Myalgias     Past Medical History:  Diagnosis Date  . Adenoma of right adrenal gland    Benign  . Allergy    SEASONAL  . Anemia   .  Anxiety   . Arthritis   . Asthma   . Barrett's esophagus 02/09/2014  . CAD (coronary artery disease)    a. prior MI in 2005 with 60-70% dLAD stenosis and 60-70% D2 stenosis (concern for coronary vasospasm)  . Depression   . Dizziness   . Elevated LFTs   . Focal nodular hyperplasia of liver   .  GERD (gastroesophageal reflux disease)   . Heart murmur    AS A CHILD  . Hepatic steatosis   . History of adenomatous polyp of colon 02/09/2014  . HLD (hyperlipidemia)   . HTN (hypertension)   . Liver hemangioma   . Migraine   . Migraine headache   . Myocardial infarction ([place])   . Nonalcoholic steatohepatitis ([facility]) 12/11/2013   sees [facility] at [facility]  . Palpitations   . Sinus complaint   . Stroke ([place])   . Unstable angina ([place]) 05/15/2017     Past Surgical History:  Procedure Laterality Date  . ABDOMINOPLASTY    . BLADDER SUSPENSION    . CERVICAL FUSION     C4, 5, 6  . COLONOSCOPY W/ BIOPSIES    . ESOPHAGOGASTRODUODENOSCOPY    . LEFT HEART CATH AND CORONARY ANGIOGRAPHY N/A 05/16/2017   Procedure: LEFT HEART CATH AND CORONARY ANGIOGRAPHY;  Surgeon: Troy Sine, MD;  Location: [facility];  Service: Cardiovascular;  Laterality: N/A;  . TONSILLECTOMY    . TUBAL LIGATION      Social History   Socioeconomic History  . Marital status: Married    Spouse name: Not on file  . Number of children: 1  . Years of education: Not on file  . Highest education level: Not on file  Occupational History  . Occupation: MECHANICAL DESIGNER    Employer: [employer]  Social Needs  . Financial resource strain: Not on file  . Food insecurity:    Worry: Not on file    Inability: Not on file  . Transportation needs:    Medical: Not on file    Non-medical: Not on file  Tobacco Use  . Smoking status: Former Smoker    Packs/day: 0.30    Types: Cigarettes    Last attempt to quit: 03/30/2016    Years since quitting: 1.5  . Smokeless tobacco: Never Used  Substance and Sexual Activity  . Alcohol use: Yes    Comment: occasional; 1 per day  . Drug use: No  . Sexual activity: Not on file  Lifestyle  . Physical activity:    Days per week: Not on file    Minutes per session: Not on file  . Stress: Not on file  Relationships  . Social connections:     Talks on phone: Not on file    Gets together: Not on file    Attends religious service: Not on file    Active member of club or organization: Not on file    Attends meetings of clubs or organizations: Not on file    Relationship status: Not on file  . Intimate partner violence:    Fear of current or ex partner: Not on file    Emotionally abused: Not on file    Physically abused: Not on file    Forced sexual activity: Not on file  Other Topics Concern  . Not on file  Social History Narrative   She is married with 1 son   She is a Tax inspector at a factory   Former smoker  Alcohol is used at least once a week glass of wine   Denies drug use    Family History  Problem Relation Age of Onset  . Coronary artery disease Father   . Aneurysm Father        AAA  . Melanoma Father   . Transient ischemic attack Father   . Heart disease Father   . AAA (abdominal aortic aneurysm) Father   . Aneurysm Mother        AAA  . Colon polyps Mother   . Heart disease Mother   . Irritable bowel syndrome Mother   . Diverticulitis Mother   . Other Mother        Evlyn Clines  . AAA (abdominal aortic aneurysm) Mother   . Brain cancer Mother   . Breast cancer Paternal Aunt   . Liver disease Maternal Uncle   . Heart disease Brother   . Prostate cancer Paternal Uncle   . Mesothelioma Paternal Uncle   . Colon cancer Neg Hx      Current Outpatient Medications:  .  amLODipine (NORVASC) 10 MG tablet, Take 5 mg by mouth daily., Disp: , Rfl:  .  aspirin EC 81 MG tablet, Take 81 mg by mouth daily., Disp: , Rfl:  .  benazepril (LOTENSIN) 20 MG tablet, TAKE 1 TABLET BY MOUTH EVERY DAY, Disp: 90 tablet, Rfl: 2 .  cetirizine (ZYRTEC) 10 MG tablet, Take 1 tablet (10 mg total) by mouth daily as needed (seasonal allergies)., Disp: 30 tablet, Rfl: 3 .  clopidogrel (PLAVIX) 75 MG tablet, TAKE 1 TABLET (75 MG TOTAL) BY MOUTH DAILY., Disp: 90 tablet, Rfl: 2 .  ezetimibe (ZETIA) 10 MG tablet, Take 1 tablet  (10 mg total) by mouth daily., Disp: 90 tablet, Rfl: 3 .  metoprolol succinate (TOPROL XL) 25 MG 24 hr tablet, Take 0.5 tablets (12.5 mg total) by mouth daily., Disp: 45 tablet, Rfl: 3 .  pantoprazole (PROTONIX) 40 MG tablet, TAKE 1 TABLET BY MOUTH EVERY DAY BEFORE BREAKFAST, Disp: 90 tablet, Rfl: 3 .  butorphanol (STADOL) 10 MG/ML nasal spray, INSTILL 1 SPRAY INTO ONE NOSTRIL ONCE DAILY IF NEEDED (Patient not taking: Reported on 08/22/2017), Disp: 2.5 mL, Rfl: 0 .  fluticasone (FLONASE) 50 MCG/ACT nasal spray, PLACE 2 SPRAYS INTO BOTH NOSTRILS DAILY AS NEEDED FOR SEASONAL ALLERGIES (Patient not taking: Reported on 08/22/2017), Disp: 16 g, Rfl: 11 .  gabapentin (NEURONTIN) 300 MG capsule, Take 1 capsule (300 mg total) by mouth 3 (three) times daily. (Patient not taking: Reported on 08/22/2017), Disp: 270 capsule, Rfl: 1 .  HYDROcodone-acetaminophen (NORCO/VICODIN) 5-325 MG tablet, Take 1 tablet by mouth every 6 (six) hours as needed for moderate pain. (Patient not taking: Reported on 08/22/2017), Disp: 30 tablet, Rfl: 0 .  ibuprofen (ADVIL,MOTRIN) 200 MG tablet, Take 400 mg by mouth 2 (two) times daily as needed for headache or moderate pain. , Disp: , Rfl:  .  nitroGLYCERIN (NITROSTAT) 0.4 MG SL tablet, Place 1 tablet (0.4 mg total) under the tongue every 5 (five) minutes as needed for chest pain. (Patient not taking: Reported on 08/30/2017), Disp: 25 tablet, Rfl: 3  Physical exam:  Vitals:   10/10/17 1343  BP: (!) 136/98  Pulse: 71  Resp: 18  Temp: (!) 97 F (36.1 C)  TempSrc: Tympanic  Weight: 177 lb (80.3 kg)  Height: 5' 8"  (1.727 m)   Physical Exam  Constitutional: She is oriented to person, place, and time. She appears well-developed and well-nourished.  HENT:  Head:  Normocephalic and atraumatic.  Eyes: Pupils are equal, round, and reactive to light. EOM are normal.  Neck: Normal range of motion.  Cardiovascular: Normal rate, regular rhythm and normal heart sounds.  Pulmonary/Chest:  Effort normal and breath sounds normal.  Abdominal: Soft. Bowel sounds are normal.  Neurological: She is alert and oriented to person, place, and time.  Skin: Skin is warm and dry.     CMP Latest Ref Rng & Units 08/22/2017  Glucose 65 - 99 mg/dL 111(H)  BUN 6 - 20 mg/dL 24(H)  Creatinine 0.44 - 1.00 mg/dL 0.72  Sodium 135 - 145 mmol/L 137  Potassium 3.5 - 5.1 mmol/L 4.6  Chloride 101 - 111 mmol/L 102  CO2 22 - 32 mmol/L 26  Calcium 8.9 - 10.3 mg/dL 9.4  Total Protein 6.5 - 8.1 g/dL 7.3  Total Bilirubin 0.3 - 1.2 mg/dL 0.6  Alkaline Phos 38 - 126 U/L 62  AST 15 - 41 U/L 63(H)  ALT 14 - 54 U/L 107(H)   CBC Latest Ref Rng & Units 10/10/2017  WBC 3.6 - 11.0 K/uL 9.7  Hemoglobin 12.0 - 16.0 g/dL 18.3(H)  Hematocrit 35.0 - 47.0 % 52.7(H)  Platelets 150 - 440 K/uL 291      Assessment and plan- Patient is a 52 y.o. 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 However the low EPO levels are not typically seen in secondary polycythemia. Negative JAK2 and exon 12 mutations argue against a primary process as well. I will check cbc with diff today. 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 I will see her back in 3 months but sooner if we pursue a bone marrow biopsy based on todays results   Visit Diagnosis 1. Polycythemia      Dr. Randa Evens, MD, MPH [facility] at [facility] [phone] 10/14/2017 8:05 AM

## 2017-10-15 ENCOUNTER — Ambulatory Visit: Payer: 59 | Admitting: Internal Medicine

## 2017-10-16 ENCOUNTER — Telehealth: Payer: Self-pay | Admitting: *Deleted

## 2017-10-16 NOTE — Telephone Encounter (Signed)
Called pt phone line and left message that her hgb and hct were still elevated and based on low epoeitin levels that Dr. Janese Banks would like to proceed with BM BX and she ahd spoke of the possibility in the office on your visit. If you are agreeable to have the bx. I will send a request to get one scheduled. I gave her my direct phone number to rtn my call

## 2017-10-24 ENCOUNTER — Telehealth: Payer: Self-pay | Admitting: *Deleted

## 2017-10-24 NOTE — Telephone Encounter (Signed)
[place] called pt for me to ask her if she is interested or agreeable to bone marrow bx and we will await her decision.

## 2017-10-28 ENCOUNTER — Telehealth: Payer: Self-pay

## 2017-10-28 NOTE — Telephone Encounter (Signed)
Left message to inform the patient about the BM bx that she need to get done as soon as possible. This patient has been unsuccessful to reach.

## 2017-11-19 ENCOUNTER — Ambulatory Visit: Payer: 59 | Admitting: Internal Medicine

## 2017-11-19 ENCOUNTER — Encounter: Payer: Self-pay | Admitting: Internal Medicine

## 2017-11-19 VITALS — BP 128/74 | HR 80 | Ht 68.0 in | Wt 170.0 lb

## 2017-11-19 DIAGNOSIS — E7849 Other hyperlipidemia: Secondary | ICD-10-CM | POA: Diagnosis not present

## 2017-11-19 DIAGNOSIS — I2510 Atherosclerotic heart disease of native coronary artery without angina pectoris: Secondary | ICD-10-CM

## 2017-11-19 DIAGNOSIS — I471 Supraventricular tachycardia: Secondary | ICD-10-CM

## 2017-11-19 DIAGNOSIS — E785 Hyperlipidemia, unspecified: Secondary | ICD-10-CM | POA: Diagnosis not present

## 2017-11-19 DIAGNOSIS — I10 Essential (primary) hypertension: Secondary | ICD-10-CM | POA: Diagnosis not present

## 2017-11-19 NOTE — Progress Notes (Signed)
OFFICE NOTE  Chief Complaint:  No complaints  Primary Care Physician: Susy Frizzle, MD  HPI:  Janet [name] is a 52 y.o. female with a history of moderate coronary artery disease and MI in 2005, at which time she was noted to have a 60-70% distal LAD stenosis and a 60-70% ostial D1 stenosis on cath. There is no clear culprit lesion and she did not receive a stent. She was thought to have vasospasm. Since then she's had numerous stress tests without any recurrent ischemia and she denies any recurrent chest pain. She has had ongoing problems with TIA and has been on and off of Plavix. Mostly she notes visual field cuts as well as arm and facial numbness. She has been seen by Dr. Tomi Likens with neurology and had an echocardiogram this past year which showed an EF of 60-65%, mild LVH, mild AI and mild TR. A bubble study was not performed. She's also had bilateral carotid artery ultrasounds which showed moderate to large amount of atherosclerotic plaque on the right greater than left without hemodynamically significant stenosis. 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 She was then referred to Dr. Servando Snare with thoracic surgery. It was also noted that she had multiple subcentimeter pulmonary nodules which require follow-up. There are no plans for surgery at this time. She was placed on a monitor which demonstrated no obvious arrhythmias or atrial fibrillation. She has a history of statin intolerance in the past. She is previously been on Lipitor, Crestor, Zocor, Pravachol and is currently tolerating Livalo, however, LDL-C on 02/09/2016 was 147, which represents poor control given her high carotid artery plaque burden and history of coronary artery disease (ASCVD).   04/18/2017  Janet [name] returns today for follow-up of her dyslipidemia and coronary disease.  Fortunately she has had no further TIA events this year.  In  January she underwent echo with bubble study which was negative for PFO.  She also had carotid Dopplers which were normal.  She does have ASCVD and is been statin intolerant in the past.  Given her prior MI and LAD and diagonal stenosis on catheterization, we recommended aggressive medical therapy with a goal LDL less than 70.  She was recently started on Praluent, however her LDL was not improved.  Subsequently it seems that she was taken off the medication due to elevated liver enzymes.  She does have a history of nonalcoholic fatty liver disease.  Her AST and ALT at baseline were 39 and 60, but increased to 125 and 237 on medication.  Praluent was discontinued and her AST return to 61 with ALT 116.  She was asymptomatic with this.  05/15/2017  Janet [name] was seen today as an add-on for acute chest pain.  This was not much of an issue in December when I saw her however over the past 2 weeks she has had some progressive chest discomfort.  She is required nitroglycerin.  She feels as a squeezing pain in the center of her chest which can occur with exertion or at rest.  She said the pain radiates up to her jaw and into her teeth.  She takes nitroglycerin and does have some relief.  This past week she has had to take for 5 nitroglycerin and is concerned about coronary disease.  We repeated her EKG today which demonstrates normal sinus rhythm and inferior and lateral ischemic changes.  This was present in  her EKG in December however may be somewhat worse today.  These changes have been somewhat stable and were considered repolarization abnormalities.  07/25/2017  Janet [name] was recently seen by our advanced practice providers for symptoms of chest pain, but her symptoms so included palpitations.  She wore a monitor which showed some paroxysmal atrial tachycardia.  Short runs less than 10 beats.  It is notable that her heart rate varies.  In February heart rate was in the 80s and in January in the 70s.  Today  her heart rates in the low 50s, but I still think there is room for her to potentially be on a beta-blocker.  In addition we discussed her dyslipidemia.  Unfortunately she has been intolerant to statins and had had no response to both PCSK9 inhibitors.  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  I offered genetic testing however due to cost issues he declined.  She tells me that she has a significant family history of dyslipidemia and her brother recently started on Repatha.  I suspect he may not have improvement either.  We discussed another alternative, namely Lomitapide.  This would have to be used very cautiously given her history of abnormal liver enzymes in the past, but could significantly decrease her cholesterol by an alternative mechanism.  She wishes to discuss this further.  In the meantime I would recommend we try her on ezetimibe.  She does not have a history of trying that before.  It was ordered but apparently was too expensive when it was previously non-generic.  11/19/2017  Janet [name] returns today for follow-up.  Overall she is doing well denies any recurrent palpitations or chest pain.  Heart rates in the 80s today with normal blood pressure.  We have also been seeing her in the lipid clinic for management of dyslipidemia.  She has been intolerant to statins and was a PCSK9 nonresponder.  It was thought that she might have LDL receptor mutation however genetic testing was not performed.  We discussed starting ezetimibe which she has been taking.  She reports daily fatigue but cannot directly attributed to this medicine.  We have not yet reassessed her lipid profile.  PMHx:  Past Medical History:  Diagnosis Date  . Adenoma of right adrenal gland    Benign  . Allergy    SEASONAL  . Anemia   . Anxiety   . Arthritis   . Asthma   . Barrett's esophagus 02/09/2014  . CAD (coronary artery disease)    a. prior MI in 2005 with 60-70% dLAD stenosis and 60-70% D2 stenosis (concern for  coronary vasospasm)  . Depression   . Dizziness   . Elevated LFTs   . Focal nodular hyperplasia of liver   . GERD (gastroesophageal reflux disease)   . Heart murmur    AS A CHILD  . Hepatic steatosis   . History of adenomatous polyp of colon 02/09/2014  . HLD (hyperlipidemia)   . HTN (hypertension)   . Liver hemangioma   . Migraine   . Migraine headache   . Myocardial infarction ([place])   . Nonalcoholic steatohepatitis (NASH) 12/11/2013   sees [facility] at [facility]  . Palpitations   . Sinus complaint   . Stroke ([place])   . Unstable angina ([place]) 05/15/2017    Past Surgical History:  Procedure Laterality Date  . ABDOMINOPLASTY    . BLADDER SUSPENSION    . CERVICAL FUSION     C4, 5, 6  .  COLONOSCOPY W/ BIOPSIES    . ESOPHAGOGASTRODUODENOSCOPY    . LEFT HEART CATH AND CORONARY ANGIOGRAPHY N/A 05/16/2017   Procedure: LEFT HEART CATH AND CORONARY ANGIOGRAPHY;  Surgeon: Troy Sine, MD;  Location: [place] [place] [facility];  Service: Cardiovascular;  Laterality: N/A;  . TONSILLECTOMY    . TUBAL LIGATION      FAMHx:  Family History  Problem Relation Age of Onset  . Coronary artery disease Father   . Aneurysm Father        AAA  . Melanoma Father   . Transient ischemic attack Father   . Heart disease Father   . AAA (abdominal aortic aneurysm) Father   . Aneurysm Mother        AAA  . Colon polyps Mother   . Heart disease Mother   . Irritable bowel syndrome Mother   . Diverticulitis Mother   . Other Mother        Evlyn Clines  . AAA (abdominal aortic aneurysm) Mother   . Brain cancer Mother   . Breast cancer Paternal Aunt   . Liver disease Maternal Uncle   . Heart disease Brother   . Prostate cancer Paternal Uncle   . Mesothelioma Paternal Uncle   . Colon cancer Neg Hx     SOCHx:   reports that she quit smoking about 19 months ago. Her smoking use included cigarettes. She smoked 0.30 packs per day. She has never used smokeless tobacco. She reports that  she drinks alcohol. She reports that she does not use drugs.  ALLERGIES:  Allergies  Allergen Reactions  . Aggrenox [Aspirin-Dipyridamole Er] Tinitus  . Atorvastatin Other (See Comments)    Myalgias   . Codeine Itching and Other (See Comments)    Nose itches  . Imitrex [Sumatriptan Base] Swelling and Other (See Comments)    Throat swelling  . Isosorbide Nitrate Other (See Comments)    headaches  . Other Other (See Comments)    Artificial sweeteners cause heart palpitations (stevia is ok)  . Rosuvastatin Other (See Comments)    Restless legs  . Simvastatin Other (See Comments)    Myalgias    ROS: Pertinent items noted in HPI and remainder of comprehensive ROS otherwise negative.  HOME MEDS: Current Outpatient Medications on File Prior to Visit  Medication Sig Dispense Refill  . amLODipine (NORVASC) 10 MG tablet Take 5 mg by mouth daily.    Marland Kitchen aspirin EC 81 MG tablet Take 81 mg by mouth daily.    . benazepril (LOTENSIN) 20 MG tablet TAKE 1 TABLET BY MOUTH EVERY DAY 90 tablet 2  . butorphanol (STADOL) 10 MG/ML nasal spray INSTILL 1 SPRAY INTO ONE NOSTRIL ONCE DAILY IF NEEDED 2.5 mL 0  . cetirizine (ZYRTEC) 10 MG tablet Take 1 tablet (10 mg total) by mouth daily as needed (seasonal allergies). 30 tablet 3  . clopidogrel (PLAVIX) 75 MG tablet TAKE 1 TABLET (75 MG TOTAL) BY MOUTH DAILY. 90 tablet 2  . fluticasone (FLONASE) 50 MCG/ACT nasal spray PLACE 2 SPRAYS INTO BOTH NOSTRILS DAILY AS NEEDED FOR SEASONAL ALLERGIES 16 g 11  . gabapentin (NEURONTIN) 300 MG capsule Take 1 capsule (300 mg total) by mouth 3 (three) times daily. 270 capsule 1  . HYDROcodone-acetaminophen (NORCO/VICODIN) 5-325 MG tablet Take 1 tablet by mouth every 6 (six) hours as needed for moderate pain. 30 tablet 0  . ibuprofen (ADVIL,MOTRIN) 200 MG tablet Take 400 mg by mouth 2 (two) times daily as needed for headache or moderate pain.     Marland Kitchen  metoprolol succinate (TOPROL XL) 25 MG 24 hr tablet Take 0.5 tablets (12.5  mg total) by mouth daily. 45 tablet 3  . nitroGLYCERIN (NITROSTAT) 0.4 MG SL tablet Place 1 tablet (0.4 mg total) under the tongue every 5 (five) minutes as needed for chest pain. 25 tablet 3  . pantoprazole (PROTONIX) 40 MG tablet TAKE 1 TABLET BY MOUTH EVERY DAY BEFORE BREAKFAST 90 tablet 3  . ezetimibe (ZETIA) 10 MG tablet Take 1 tablet (10 mg total) by mouth daily. 90 tablet 3   No current facility-administered medications on file prior to visit.     LABS/IMAGING: No results found for this or any previous visit (from the past 48 hour(s)). No results found.  WEIGHTS: Wt Readings from Last 3 Encounters:  11/19/17 170 lb (77.1 kg)  10/10/17 177 lb (80.3 kg)  09/19/17 186 lb (84.4 kg)    VITALS: BP 128/74   Pulse 80   Ht 5' 8"  (1.727 m)   Wt 170 lb (77.1 kg)   BMI 25.85 kg/m   EXAM: General appearance: alert and no distress Neck: no carotid bruit and no JVD Lungs: clear to auscultation bilaterally Heart: regular rate and rhythm Abdomen: soft, non-tender; bowel sounds normal; no masses,  no organomegaly Extremities: extremities normal, atraumatic, no cyanosis or edema Pulses: 2+ and symmetric Skin: Skin color, texture, turgor normal. No rashes or lesions Neurologic: Grossly normal Psych: Pleasant  EKG: Normal sinus rhythm at 80, left anterior fascicular block, voltage for LVH, possible lateral infarct pattern-personally reviewed  ASSESSMENT: 1. PAT 2. Moderate nonobstructive CAD with history of MI in 2005 3. Recurrent TIAs without clear etiology 4. 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 Multiple bilateral pulmonary nodules 6. 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 Tobacco abuse - quit x 1 month ago 10. Statin intolerance  PLAN: 1.   Mrs. Brecht denies any recent recurrent arrhythmias.  She is on beta-blocker.  She had moderate  nonobstructive coronary disease with history of MI and has a goal LDL less than 70.  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  She did not respond to PCSK9.  I started ezetimibe and will need to repeat a lipid profile she has been on this for about 3 months.  Overall though she feels fairly well.  Her only other complaint is daily fatigue.  She may be a candidate for an upcoming clinical trial involving Inclisiran (siRNA to PSCSK9).  Follow-up 6 months.  Pixie Casino, MD, [facility], [place] Director of the Advanced Lipid Disorders &  Cardiovascular Risk Reduction Clinic Attending Cardiologist  Direct Dial: [phone]  Fax: [phone]  Website:  [email or web address] [place] 11/19/2017, 11:20 AM

## 2017-11-19 NOTE — Patient Instructions (Signed)
Your physician wants you to follow-up in: 6 months with Dr. Hilty. You will receive a reminder letter in the mail two months in advance. If you don't receive a letter, please call our office to schedule the follow-up appointment.    

## 2017-11-20 LAB — LIPID PANEL
CHOL/HDL RATIO: 3.9 ratio (ref 0.0–4.4)
Cholesterol, Total: 317 mg/dL — ABNORMAL HIGH (ref 100–199)
HDL: 81 mg/dL (ref >39)
LDL CALC: 202 mg/dL — AB (ref 0–99)
TRIGLYCERIDES: 168 mg/dL — AB (ref 0–149)
VLDL CHOLESTEROL CAL: 34 mg/dL (ref 5–40)

## 2017-11-20 LAB — HEPATIC FUNCTION PANEL
ALT: 90 IU/L — AB (ref 0–32)
AST: 78 IU/L — AB (ref 0–40)
Albumin: 4.8 g/dL (ref 3.5–5.5)
Alkaline Phosphatase: 84 IU/L (ref 39–117)
BILIRUBIN TOTAL: 0.6 mg/dL (ref 0.0–1.2)
Bilirubin, Direct: 0.19 mg/dL (ref 0.00–0.40)
Total Protein: 7.5 g/dL (ref 6.0–8.5)

## 2017-11-21 ENCOUNTER — Inpatient Hospital Stay: Payer: 59 | Attending: Oncology | Admitting: Oncology

## 2017-11-21 ENCOUNTER — Encounter: Payer: Self-pay | Admitting: Oncology

## 2017-11-21 ENCOUNTER — Other Ambulatory Visit: Payer: Self-pay

## 2017-11-21 ENCOUNTER — Inpatient Hospital Stay: Payer: 59

## 2017-11-21 VITALS — BP 138/89 | HR 80 | Temp 97.7°F | Resp 18 | Ht 68.0 in | Wt 172.0 lb

## 2017-11-21 DIAGNOSIS — Z87891 Personal history of nicotine dependence: Secondary | ICD-10-CM | POA: Diagnosis not present

## 2017-11-21 DIAGNOSIS — D751 Secondary polycythemia: Secondary | ICD-10-CM | POA: Diagnosis not present

## 2017-11-21 LAB — CBC
HCT: 53.6 % — ABNORMAL HIGH (ref 35.0–47.0)
Hemoglobin: 18.3 g/dL — ABNORMAL HIGH (ref 12.0–16.0)
MCH: 32 pg (ref 26.0–34.0)
MCHC: 34.1 g/dL (ref 32.0–36.0)
MCV: 93.6 fL (ref 80.0–100.0)
PLATELETS: 266 10*3/uL (ref 150–440)
RBC: 5.73 MIL/uL — AB (ref 3.80–5.20)
RDW: 16.1 % — AB (ref 11.5–14.5)
WBC: 11 10*3/uL (ref 3.6–11.0)

## 2017-11-21 NOTE — Progress Notes (Signed)
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 She has been referred to [place] for evaluation of polycythemia.35patient has had a chronically elevated hemoglobin and back in 2015 her H&H was 16.2/48.1. Most recently in January 2019 her H&H was 17.1/49.2 then 18.4/53.7 in April 2019.Her main complaint today is excessive fatigue. She reports a slight lacy rash in the medial aspect of her left knee. She denies any blood in her urine. Reports that her appetite is good and she denies any unintentional weight loss. She does not do any anabolic steroids. She reports getting a good night sleep and wakes up refreshed in the morning. She does not remember being told that she snores at night. She does not have any known chronic lung disease.  Results of blood work from 08/22/2017 were as follows: CBC showed white count of 11.5, H&H of 17.9/52.5 with an MCV of 89.6 and a platelet count of 293. E Po level was low at 1.5. Jak 2 mutation testing was negative. Urinalysis did not reveal any hematuria. CMP showed elevated AST and ALT of 63 and 107 respectively. carboxyhb levels were elevated at 9   Interval history- she has gone back to smoking after quitting for last 8 months. She smokes 8-10 cigarettes a day. Denies any headache, fatigue or chest pain  ECOG PS- 0 Pain scale- 0   Review of systems-  Review of Systems  Constitutional: Negative for chills, fever, malaise/fatigue and weight loss.  HENT: Negative for congestion, ear discharge and nosebleeds.   Eyes: Negative for blurred vision.  Respiratory: Negative for cough, hemoptysis, sputum production, shortness of breath and wheezing.   Cardiovascular: Negative for chest pain, palpitations, orthopnea and claudication.  Gastrointestinal: Negative for abdominal pain, blood in stool, constipation, diarrhea, heartburn, melena, nausea and vomiting.  Genitourinary: Negative for dysuria, flank pain, frequency, hematuria and urgency.  Musculoskeletal: Negative for back pain, joint pain and myalgias.  Skin: Negative for rash.  Neurological: Negative for dizziness, tingling, focal weakness, seizures, weakness and headaches.  Endo/Heme/Allergies: Does not bruise/bleed easily.  Psychiatric/Behavioral: Negative for depression and suicidal ideas. The patient does not have insomnia.       Allergies  Allergen Reactions  . Aggrenox [Aspirin-Dipyridamole Er] Tinitus  . Atorvastatin Other (See Comments)    Myalgias   . Codeine Itching and Other (See Comments)    Nose itches  . Imitrex [Sumatriptan Base] Swelling and Other (See Comments)    Throat swelling  . Isosorbide Nitrate Other (See Comments)    headaches  . Other Other (See Comments)    Artificial sweeteners cause heart palpitations (stevia is ok)  . Rosuvastatin Other (See Comments)    Restless legs  . Simvastatin Other (See Comments)    Myalgias     Past Medical History:  Diagnosis Date  . Adenoma of right adrenal gland    Benign  . Allergy    SEASONAL  . Anemia   .  Anxiety   . Arthritis   . Asthma   . Barrett's esophagus 02/09/2014  . CAD (coronary artery disease)    a. prior MI in 2005 with 60-70% dLAD stenosis and 60-70% D2 stenosis (concern for coronary vasospasm)  . Depression   . Dizziness   . Elevated LFTs   . Focal nodular hyperplasia of liver   . GERD  (gastroesophageal reflux disease)   . Heart murmur    AS A CHILD  . Hepatic steatosis   . History of adenomatous polyp of colon 02/09/2014  . HLD (hyperlipidemia)   . HTN (hypertension)   . Liver hemangioma   . Migraine   . Migraine headache   . Myocardial infarction ([place])   . Nonalcoholic steatohepatitis (NASH) 12/11/2013   se[facility] at [facility]  . Palpitations   . Sinus complaint   . Stroke ([place])   . Unstable angina ([place]) 05/15/2017     Past Surgical History:  Procedure Laterality Date  . ABDOMINOPLASTY    . BLADDER SUSPENSION    . CERVICAL FUSION     C4, 5, 6  . COLONOSCOPY W/ BIOPSIES    . ESOPHAGOGASTRODUODENOSCOPY    . LEFT HEART CATH AND CORONARY ANGIOGRAPHY N/A 05/16/2017   Procedure: LEFT HEART CATH AND CORONARY ANGIOGRAPHY;  Surgeon: Troy Sine, MD;  Location: [place] [facility];  Service: Cardiovascular;  Laterality: N/A;  . TONSILLECTOMY    . TUBAL LIGATION      Social History   Socioeconomic History  . Marital status: Married    Spouse name: Not on file  . Number of children: 1  . Years of education: Not on file  . Highest education level: Not on file  Occupational History  . Occupation: MECHANICAL DESIGNER    Employer: [employer]  Social Needs  . Financial resource strain: Not on file  . Food insecurity:    Worry: Not on file    Inability: Not on file  . Transportation needs:    Medical: Not on file    Non-medical: Not on file  Tobacco Use  . Smoking status: Former Smoker    Packs/day: 0.30    Types: Cigarettes    Last attempt to quit: 03/30/2016    Years since quitting: 1.6  . Smokeless tobacco: Never Used  Substance and Sexual Activity  . Alcohol use: Yes    Comment: occasional; 1 per day  . Drug use: No  . Sexual activity: Not on file  Lifestyle  . Physical activity:    Days per week: Not on file    Minutes per session: Not on file  . Stress: Not on file  Relationships  . Social connections:    Talks  on phone: Not on file    Gets together: Not on file    Attends religious service: Not on file    Active member of club or organization: Not on file    Attends meetings of clubs or organizations: Not on file    Relationship status: Not on file  . Intimate partner violence:    Fear of current or ex partner: Not on file    Emotionally abused: Not on file    Physically abused: Not on file    Forced sexual activity: Not on file  Other Topics Concern  . Not on file  Social History Narrative   She is married with 1 son   She is a Tax inspector at a factory   Former smoker  Alcohol is used at least once a week glass of wine   Denies drug use    Family History  Problem Relation Age of Onset  . Coronary artery disease Father   . Aneurysm Father        AAA  . Melanoma Father   . Transient ischemic attack Father   . Heart disease Father   . AAA (abdominal aortic aneurysm) Father   . Aneurysm Mother        AAA  . Colon polyps Mother   . Heart disease Mother   . Irritable bowel syndrome Mother   . Diverticulitis Mother   . Other Mother        Evlyn Clines  . AAA (abdominal aortic aneurysm) Mother   . Brain cancer Mother   . Breast cancer Paternal Aunt   . Liver disease Maternal Uncle   . Heart disease Brother   . Prostate cancer Paternal Uncle   . Mesothelioma Paternal Uncle   . Colon cancer Neg Hx      Current Outpatient Medications:  .  amLODipine (NORVASC) 10 MG tablet, Take 5 mg by mouth daily., Disp: , Rfl:  .  aspirin EC 81 MG tablet, Take 81 mg by mouth daily., Disp: , Rfl:  .  benazepril (LOTENSIN) 20 MG tablet, TAKE 1 TABLET BY MOUTH EVERY DAY, Disp: 90 tablet, Rfl: 2 .  butorphanol (STADOL) 10 MG/ML nasal spray, INSTILL 1 SPRAY INTO ONE NOSTRIL ONCE DAILY IF NEEDED, Disp: 2.5 mL, Rfl: 0 .  cetirizine (ZYRTEC) 10 MG tablet, Take 1 tablet (10 mg total) by mouth daily as needed (seasonal allergies)., Disp: 30 tablet, Rfl: 3 .  clopidogrel (PLAVIX) 75 MG tablet,  TAKE 1 TABLET (75 MG TOTAL) BY MOUTH DAILY., Disp: 90 tablet, Rfl: 2 .  ezetimibe (ZETIA) 10 MG tablet, Take 1 tablet (10 mg total) by mouth daily., Disp: 90 tablet, Rfl: 3 .  fluticasone (FLONASE) 50 MCG/ACT nasal spray, PLACE 2 SPRAYS INTO BOTH NOSTRILS DAILY AS NEEDED FOR SEASONAL ALLERGIES, Disp: 16 g, Rfl: 11 .  gabapentin (NEURONTIN) 300 MG capsule, Take 1 capsule (300 mg total) by mouth 3 (three) times daily., Disp: 270 capsule, Rfl: 1 .  HYDROcodone-acetaminophen (NORCO/VICODIN) 5-325 MG tablet, Take 1 tablet by mouth every 6 (six) hours as needed for moderate pain., Disp: 30 tablet, Rfl: 0 .  ibuprofen (ADVIL,MOTRIN) 200 MG tablet, Take 400 mg by mouth 2 (two) times daily as needed for headache or moderate pain. , Disp: , Rfl:  .  metoprolol succinate (TOPROL XL) 25 MG 24 hr tablet, Take 0.5 tablets (12.5 mg total) by mouth daily., Disp: 45 tablet, Rfl: 3 .  nitroGLYCERIN (NITROSTAT) 0.4 MG SL tablet, Place 1 tablet (0.4 mg total) under the tongue every 5 (five) minutes as needed for chest pain., Disp: 25 tablet, Rfl: 3 .  pantoprazole (PROTONIX) 40 MG tablet, TAKE 1 TABLET BY MOUTH EVERY DAY BEFORE BREAKFAST, Disp: 90 tablet, Rfl: 3  Physical exam:  Vitals:   11/21/17 1409 11/21/17 1418  BP:  138/89  Pulse:  80  Resp:  18  Temp:  97.7 F (36.5 C)  TempSrc:  Tympanic  SpO2:  99%  Weight: (P) 172 lb (78 kg) 172 lb (78 kg)  Height: (P) 5' 8"  (1.727 m) 5' 8"  (1.727 m)   Physical Exam  Constitutional: She is oriented to person, place, and time.  HENT:  Head: Normocephalic and atraumatic.  Eyes: Pupils are equal, round, and reactive to light. EOM are normal.  Neck: Normal range of motion.  Cardiovascular: Normal rate, regular rhythm and normal heart sounds.  Pulmonary/Chest: Effort normal and breath sounds normal.  Abdominal: Soft. Bowel sounds are normal.  Neurological: She is alert and oriented to person, place, and time.  Skin: Skin is warm and dry.     CMP Latest Ref Rng &  Units 11/19/2017  Glucose 65 - 99 mg/dL -  BUN 6 - 20 mg/dL -  Creatinine 0.44 - 1.00 mg/dL -  Sodium 135 - 145 mmol/L -  Potassium 3.5 - 5.1 mmol/L -  Chloride 101 - 111 mmol/L -  CO2 22 - 32 mmol/L -  Calcium 8.9 - 10.3 mg/dL -  Total Protein 6.0 - 8.5 g/dL 7.5  Total Bilirubin 0.0 - 1.2 mg/dL 0.6  Alkaline Phos 39 - 117 IU/L 84  AST 0 - 40 IU/L 78(H)  ALT 0 - 32 IU/L 90(H)   CBC Latest Ref Rng & Units 11/21/2017  WBC 3.6 - 11.0 K/uL 11.0  Hemoglobin 12.0 - 16.0 g/dL 18.3(H)  Hematocrit 35.0 - 47.0 % 53.6(H)  Platelets 150 - 440 K/uL 266     Assessment and plan- Patient is a 52 y.o. 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 JAK2 and exon 12 negative which argues against PV but low EPO levels typically do not go with secondary polycythemia either. We wanted to pursue bone marrow biopsy after her last visit but we were unable to get in touch with her. I will proceed with bone marrow biopsy at this time and see her back in 2-3 weeks time for possible phlebotomy  Discussed risks and benefits of bone marrow biopsy including all but not limited to pain, infection and bleeding. Patient understands and agrees to proceed as planned.   Visit Diagnosis 1. Polycythemia      Dr. Randa Evens, MD, MPH [facility] at [facility] [phone] 11/21/2017 2:19 PM

## 2017-11-21 NOTE — Progress Notes (Signed)
No new changes noted today 

## 2017-11-28 ENCOUNTER — Telehealth: Payer: Self-pay | Admitting: Family Medicine

## 2017-11-28 ENCOUNTER — Telehealth: Payer: Self-pay | Admitting: *Deleted

## 2017-11-28 NOTE — Telephone Encounter (Signed)
Pt called yest. To tell me that she is agreeable to set up the BM bx.  I called her back late in evening and let her know that I got the message and I will set it up. I first have to call her PCP Dr. Dennard Schaumann and get permission for her to come off the plavix.. She is agreeable for the plan. She also states she checked with her insurance and there may need prior approval.  I called the md office today and left message with his nurse to get permission to come off plavix 5 days and then get bx day 6 and she can resume after her procedure.  I am waiting a call back and in my message I did state that I need this in writing

## 2017-11-28 NOTE — Telephone Encounter (Signed)
Janet [name] from cancer center called and pt needs to have a bone marrow biopsy and needs permission to come off plavix 5 days prior to procedure and then get bx day 6 and she can resume after her procedure.   Call [place] at [phone]

## 2017-11-29 ENCOUNTER — Telehealth: Payer: Self-pay | Admitting: Internal Medicine

## 2017-11-29 NOTE — Telephone Encounter (Signed)
   Primary Cardiologist: Janet Casino, MD  Chart reviewed as part of pre-operative protocol coverage. Patient was contacted 11/29/2017 in reference to pre-operative risk assessment for pending surgery as outlined below.  Janet [name] was last seen 11/19/17 by Dr. Debara Pickett. H/o CAD, TIA (on and off Plavix), HLD with statin intolerance, carotid artery disease, PAT, HTN, migraines, NASH. Doing well at 11/19/17 OV.  Plavix is prescribed by primary care but Dr. Dennard Schaumann has requested input from Dr. Debara Pickett. Per his phone note yesterday he states, "From my standpoint I am fine with her discontinuing Plavix 5 days prior to the procedure and then resuming after the procedure.  However she also would need to get approval from her cardiologist who is also prescribing the Plavix given her history of coronary artery disease.  This is Dr. Debara Pickett."  Will route to Dr. Debara Pickett for input on clearance given recent OV and Plavix. Dr. Debara Pickett - - Please route response to [facility] PREOP (the pre-op pool). Thank you.  Charlie Pitter, PA-C 11/29/2017, 1:07 PM

## 2017-11-29 NOTE — Telephone Encounter (Signed)
From my standpoint I am fine with her discontinuing Plavix 5 days prior to the procedure and then resuming after the procedure.  However she also would need to get approval from her cardiologist who is also prescribing the Plavix given her history of coronary artery disease.  This is Dr. Debara Pickett.

## 2017-11-29 NOTE — Telephone Encounter (Signed)
New Message:          [facility] Pre-operative Risk Assessment    Request for surgical clearance:  1. What type of surgery is being performed? Bone Marrow Biopsy  2. When is this surgery scheduled? TBD  3. What type of clearance is required (medical clearance vs. Pharmacy clearance to hold med vs. Both)?Pharmacy  4. Are there any medications that need to be held prior to surgery and how long?Plavix-5 days before procedure  5. Practice name and name of physician performing surgery? Dr. Rao/ [facility]  6. What is your office phone number [phone]   7.   What is your office fax number [phone]  8.   Anesthesia type (None, local, MAC, general) ? Local   Janet [name] 11/29/2017, 9:42 AM  _________________________________________________________________   (provider comments below)

## 2017-12-03 ENCOUNTER — Encounter: Payer: Self-pay | Admitting: Oncology

## 2017-12-03 NOTE — Telephone Encounter (Signed)
Ok to hold plavix for 5 days prior to procedure and restart after. Acceptable risk.  Dr. Lemmie Evens

## 2017-12-04 ENCOUNTER — Other Ambulatory Visit: Payer: Self-pay | Admitting: *Deleted

## 2017-12-04 DIAGNOSIS — D751 Secondary polycythemia: Secondary | ICD-10-CM

## 2017-12-04 NOTE — Progress Notes (Signed)
[id number]

## 2017-12-04 NOTE — Telephone Encounter (Signed)
Routing to Dr. Janese Banks - patient is to hold Plavix for 5 days prior to bone marrow biopsy.   Burtis Junes, RN, [facility] [address] [place], [place]  [address] [phone]

## 2017-12-06 ENCOUNTER — Other Ambulatory Visit: Payer: Self-pay | Admitting: Family Medicine

## 2017-12-06 DIAGNOSIS — G459 Transient cerebral ischemic attack, unspecified: Secondary | ICD-10-CM

## 2017-12-10 ENCOUNTER — Telehealth: Payer: Self-pay | Admitting: *Deleted

## 2017-12-10 NOTE — Telephone Encounter (Signed)
Called pt and let her know about appt for 8/30 1:30 labs, then see md and then poss. Phlebotomy. She is agreeable to 8/30 at 1:30

## 2017-12-11 DIAGNOSIS — R102 Pelvic and perineal pain: Secondary | ICD-10-CM | POA: Diagnosis not present

## 2017-12-11 DIAGNOSIS — Z124 Encounter for screening for malignant neoplasm of cervix: Secondary | ICD-10-CM | POA: Diagnosis not present

## 2017-12-11 DIAGNOSIS — Z01419 Encounter for gynecological examination (general) (routine) without abnormal findings: Secondary | ICD-10-CM | POA: Diagnosis not present

## 2017-12-12 ENCOUNTER — Other Ambulatory Visit: Payer: Self-pay | Admitting: Cardiothoracic Surgery

## 2017-12-12 ENCOUNTER — Other Ambulatory Visit: Payer: Self-pay | Admitting: Radiology

## 2017-12-12 DIAGNOSIS — I2510 Atherosclerotic heart disease of native coronary artery without angina pectoris: Secondary | ICD-10-CM

## 2017-12-12 DIAGNOSIS — I712 Thoracic aortic aneurysm, without rupture, unspecified: Secondary | ICD-10-CM

## 2017-12-13 ENCOUNTER — Other Ambulatory Visit (HOSPITAL_COMMUNITY)
Admission: RE | Admit: 2017-12-13 | Discharge: 2017-12-13 | Disposition: A | Discharge status: Home / Self Care | Payer: 59 | Source: Ambulatory Visit | Attending: Oncology | Admitting: Oncology

## 2017-12-13 ENCOUNTER — Ambulatory Visit
Admission: RE | Admit: 2017-12-13 | Discharge: 2017-12-13 | Disposition: A | Discharge status: Home / Self Care | Payer: 59 | Source: Ambulatory Visit | Attending: Oncology | Admitting: Oncology

## 2017-12-13 DIAGNOSIS — D751 Secondary polycythemia: Secondary | ICD-10-CM | POA: Diagnosis not present

## 2017-12-13 DIAGNOSIS — D7589 Other specified diseases of blood and blood-forming organs: Secondary | ICD-10-CM | POA: Diagnosis not present

## 2017-12-13 HISTORY — DX: Nonalcoholic steatohepatitis (NASH): K75.81

## 2017-12-13 LAB — CBC WITH DIFFERENTIAL/PLATELET
Basophils Absolute: 0.1 10*3/uL (ref 0–0.1)
Basophils Relative: 1 %
EOS PCT: 1 %
Eosinophils Absolute: 0.1 10*3/uL (ref 0–0.7)
HEMATOCRIT: 54.3 % — AB (ref 35.0–47.0)
Hemoglobin: 18.3 g/dL — ABNORMAL HIGH (ref 12.0–16.0)
LYMPHS ABS: 3.5 10*3/uL (ref 1.0–3.6)
LYMPHS PCT: 34 %
MCH: 31.9 pg (ref 26.0–34.0)
MCHC: 33.7 g/dL (ref 32.0–36.0)
MCV: 94.8 fL (ref 80.0–100.0)
Monocytes Absolute: 0.8 10*3/uL (ref 0.2–0.9)
Monocytes Relative: 8 %
Neutro Abs: 5.8 10*3/uL (ref 1.4–6.5)
Neutrophils Relative %: 56 %
PLATELETS: 268 10*3/uL (ref 150–440)
RBC: 5.73 MIL/uL — ABNORMAL HIGH (ref 3.80–5.20)
RDW: 15.5 % — ABNORMAL HIGH (ref 11.5–14.5)
WBC: 10.3 10*3/uL (ref 3.6–11.0)

## 2017-12-13 LAB — PROTIME-INR
INR: 0.87
Prothrombin Time: 11.7 seconds (ref 11.4–15.2)

## 2017-12-13 MED ORDER — MIDAZOLAM HCL 5 MG/5ML IJ SOLN
INTRAMUSCULAR | Status: AC | PRN
  Administered 2017-12-13: 0.5 mg via INTRAVENOUS
  Administered 2017-12-13 (×3): 1 mg via INTRAVENOUS
  Administered 2017-12-13: 0.5 mg via INTRAVENOUS

## 2017-12-13 MED ORDER — FENTANYL CITRATE (PF) 100 MCG/2ML IJ SOLN
INTRAMUSCULAR | Status: AC | PRN
  Administered 2017-12-13 (×3): 25 ug via INTRAVENOUS
  Administered 2017-12-13: 50 ug via INTRAVENOUS
  Administered 2017-12-13: 25 ug via INTRAVENOUS

## 2017-12-13 MED ORDER — LIDOCAINE HCL (PF) 1 % IJ SOLN
INTRAMUSCULAR | Status: AC | PRN
  Administered 2017-12-13: 10 mL

## 2017-12-13 MED ORDER — SODIUM CHLORIDE 0.9 % IV SOLN
INTRAVENOUS | Status: DC
  Administered 2017-12-13: 09:00:00 via INTRAVENOUS

## 2017-12-13 MED ORDER — HEPARIN SOD (PORK) LOCK FLUSH 100 UNIT/ML IV SOLN
INTRAVENOUS | Status: AC
  Filled 2017-12-13: qty 5

## 2017-12-13 MED ORDER — MIDAZOLAM HCL 5 MG/5ML IJ SOLN
INTRAMUSCULAR | Status: AC
  Filled 2017-12-13: qty 5

## 2017-12-13 MED ORDER — FENTANYL CITRATE (PF) 100 MCG/2ML IJ SOLN
INTRAMUSCULAR | Status: AC
  Filled 2017-12-13: qty 4

## 2017-12-13 NOTE — Procedures (Addendum)
  Procedure: CT bone marrow biopsy R iliac EBL:   minimal Complications:  none immediate  See full dictation in [facility] PACS.  D. Selah Klang MD Main # [phone] Pager  [phone]    

## 2017-12-13 NOTE — Discharge Instructions (Signed)
Bone Marrow Aspiration and Bone Marrow Biopsy, Adult, Care After °This sheet gives you information about how to care for yourself after your procedure. Your health care provider may also give you more specific instructions. If you have problems or questions, contact your health care provider. °What can I expect after the procedure? °After the procedure, it is common to have: °· Mild pain and tenderness. °· Swelling. °· Bruising. ° °Follow these instructions at home: °· Take over-the-counter or prescription medicines only as told by your health care provider. °· Do not take baths, swim, or use a hot tub until your health care provider approves. Ask if you can take a shower or have a sponge bath. °· Follow instructions from your health care provider about how to take care of the puncture site. Make sure you: °? Wash your hands with soap and water before you change your bandage (dressing). If soap and water are not available, use hand sanitizer. °? Change your dressing as told by your health care provider. °· Check your puncture site every day for signs of infection. Check for: °? More redness, swelling, or pain. °? More fluid or blood. °? Warmth. °? Pus or a bad smell. °· Return to your normal activities as told by your health care provider. Ask your health care provider what activities are safe for you. °· Do not drive for 24 hours if you were given a medicine to help you relax (sedative). °· Keep all follow-up visits as told by your health care provider. This is important. °Contact a health care provider if: °· You have more redness, swelling, or pain around the puncture site. °· You have more fluid or blood coming from the puncture site. °· Your puncture site feels warm to the touch. °· You have pus or a bad smell coming from the puncture site. °· You have a fever. °· Your pain is not controlled with medicine. °This information is not intended to replace advice given to you by your health care provider. Make sure  you discuss any questions you have with your health care provider. °Document Released: 11/03/2004 Document Revised: 11/04/2015 Document Reviewed: 09/28/2015 °Elsevier Interactive Patient Education © 2018 Elsevier Inc. ° °

## 2017-12-13 NOTE — Progress Notes (Signed)
Patient clinically stable post bone marrow biopsy per DR Vernard Gambles, dressing c/d/i, discharge instructions given with questions answered.

## 2017-12-17 ENCOUNTER — Ambulatory Visit: Payer: 59

## 2017-12-20 ENCOUNTER — Encounter (HOSPITAL_COMMUNITY): Payer: Self-pay | Admitting: Oncology

## 2017-12-27 ENCOUNTER — Inpatient Hospital Stay: Payer: 59

## 2017-12-27 ENCOUNTER — Inpatient Hospital Stay: Payer: 59 | Attending: Oncology | Admitting: Oncology

## 2017-12-27 ENCOUNTER — Encounter: Payer: Self-pay | Admitting: Oncology

## 2017-12-27 ENCOUNTER — Other Ambulatory Visit: Payer: Self-pay

## 2017-12-27 VITALS — BP 127/87 | HR 76 | Temp 97.7°F | Resp 18 | Ht 68.0 in | Wt 167.8 lb

## 2017-12-27 DIAGNOSIS — F419 Anxiety disorder, unspecified: Secondary | ICD-10-CM | POA: Insufficient documentation

## 2017-12-27 DIAGNOSIS — I252 Old myocardial infarction: Secondary | ICD-10-CM | POA: Diagnosis not present

## 2017-12-27 DIAGNOSIS — J45909 Unspecified asthma, uncomplicated: Secondary | ICD-10-CM | POA: Diagnosis not present

## 2017-12-27 DIAGNOSIS — D751 Secondary polycythemia: Secondary | ICD-10-CM

## 2017-12-27 DIAGNOSIS — E785 Hyperlipidemia, unspecified: Secondary | ICD-10-CM | POA: Insufficient documentation

## 2017-12-27 DIAGNOSIS — F329 Major depressive disorder, single episode, unspecified: Secondary | ICD-10-CM | POA: Insufficient documentation

## 2017-12-27 DIAGNOSIS — K219 Gastro-esophageal reflux disease without esophagitis: Secondary | ICD-10-CM | POA: Diagnosis not present

## 2017-12-27 DIAGNOSIS — Z7982 Long term (current) use of aspirin: Secondary | ICD-10-CM | POA: Diagnosis not present

## 2017-12-27 DIAGNOSIS — I712 Thoracic aortic aneurysm, without rupture: Secondary | ICD-10-CM | POA: Diagnosis not present

## 2017-12-27 DIAGNOSIS — M1990 Unspecified osteoarthritis, unspecified site: Secondary | ICD-10-CM | POA: Diagnosis not present

## 2017-12-27 DIAGNOSIS — Z87891 Personal history of nicotine dependence: Secondary | ICD-10-CM | POA: Insufficient documentation

## 2017-12-27 DIAGNOSIS — Z8673 Personal history of transient ischemic attack (TIA), and cerebral infarction without residual deficits: Secondary | ICD-10-CM | POA: Insufficient documentation

## 2017-12-27 DIAGNOSIS — I2510 Atherosclerotic heart disease of native coronary artery without angina pectoris: Secondary | ICD-10-CM | POA: Insufficient documentation

## 2017-12-27 DIAGNOSIS — I10 Essential (primary) hypertension: Secondary | ICD-10-CM | POA: Insufficient documentation

## 2017-12-27 DIAGNOSIS — Z79899 Other long term (current) drug therapy: Secondary | ICD-10-CM

## 2017-12-27 LAB — CBC WITH DIFFERENTIAL/PLATELET
BASOS ABS: 0.2 10*3/uL — AB (ref 0–0.1)
BASOS PCT: 1 %
EOS ABS: 0.1 10*3/uL (ref 0–0.7)
EOS PCT: 1 %
HCT: 55.3 % — ABNORMAL HIGH (ref 35.0–47.0)
Hemoglobin: 18.6 g/dL — ABNORMAL HIGH (ref 12.0–16.0)
Lymphocytes Relative: 34 %
Lymphs Abs: 4.5 10*3/uL — ABNORMAL HIGH (ref 1.0–3.6)
MCH: 32.2 pg (ref 26.0–34.0)
MCHC: 33.7 g/dL (ref 32.0–36.0)
MCV: 95.4 fL (ref 80.0–100.0)
MONO ABS: 0.8 10*3/uL (ref 0.2–0.9)
MONOS PCT: 6 %
Neutro Abs: 7.6 10*3/uL — ABNORMAL HIGH (ref 1.4–6.5)
Neutrophils Relative %: 58 %
PLATELETS: 313 10*3/uL (ref 150–440)
RBC: 5.79 MIL/uL — ABNORMAL HIGH (ref 3.80–5.20)
RDW: 14.6 % — AB (ref 11.5–14.5)
WBC: 13.2 10*3/uL — ABNORMAL HIGH (ref 3.6–11.0)

## 2017-12-27 NOTE — Progress Notes (Signed)
No new changes noted today 

## 2017-12-27 NOTE — Progress Notes (Signed)
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 She has been referred to [place] for evaluation of polycythemia.35patient has had a chronically elevated hemoglobin and back in 2015 her H&H was 16.2/48.1. Most recently in January 2019 her H&H was 17.1/49.2 then 18.4/53.7 in April 2019.Her main complaint today is excessive fatigue. She reports a slight lacy rash in the medial aspect of her left knee. She denies any blood in her urine. Reports that her appetite is good and she denies any unintentional weight loss. She does not do any anabolic steroids. She reports getting a good night sleep and wakes up refreshed in the morning. She does not remember being told that she snores at night. She does not have any known chronic lung disease.  Results of blood work from 08/22/2017 were as follows: CBC showed white count of 11.5, H&H of 17.9/52.5 with an MCV of 89.6 and a platelet count of 293. E Po level was low at 1.5. Jak 2, CALR and MPL mutation testing was negative.  Exon 12 testing was also negative. Urinalysis did not reveal any hematuria. CMP showed elevated AST and ALT of 63 and 107 respectively. carboxyhb levels were elevated at 9  When bone marrow biopsy on 12/13/2017 showed hypercellular bone marrow with trilineage hematopoiesis.  There were no morphologic features diagnostic of myeloproliferative  neoplasm seen.  Cytogenetic studies were normal  Interval history- she feels fatigued. She reports having 3-5 pounds of weight loss. Appetite is fair  ECOG PS- 1 Pain scale- 0 Opioid associated constipation- no  Review of systems- Review of Systems  Constitutional: Positive for malaise/fatigue. Negative for chills, fever and weight loss.  HENT: Negative for congestion, ear discharge and nosebleeds.   Eyes: Negative for blurred vision.  Respiratory: Negative for cough, hemoptysis, sputum production, shortness of breath and wheezing.   Cardiovascular: Negative for chest pain, palpitations, orthopnea and claudication.  Gastrointestinal: Negative for abdominal pain, blood in stool, constipation, diarrhea, heartburn, melena, nausea and vomiting.  Genitourinary: Negative for dysuria, flank pain, frequency, hematuria and urgency.  Musculoskeletal: Negative for back pain, joint pain and myalgias.  Skin: Negative for rash.  Neurological: Negative for dizziness, tingling, focal weakness, seizures, weakness and headaches.  Endo/Heme/Allergies: Does not bruise/bleed easily.  Psychiatric/Behavioral: Negative for depression and suicidal ideas. The patient does not have insomnia.       Allergies  Allergen Reactions  . Aggrenox [Aspirin-Dipyridamole Er] Tinitus  . Atorvastatin Other (See Comments)    Myalgias   . Codeine Itching and Other (See Comments)    Nose itches  . Imitrex [Sumatriptan Base] Swelling and Other (See Comments)    Throat swelling  . Isosorbide Nitrate Other (See Comments)    headaches  . Other Other (See Comments)    Artificial sweeteners cause heart palpitations (stevia is ok)  . Rosuvastatin Other (See Comments)    Restless legs  . Simvastatin Other (See Comments)    Myalgias  Past Medical History:  Diagnosis Date  . Adenoma of right adrenal gland    Benign  . Allergy    SEASONAL  . Anemia   . Anxiety   . Arthritis   . Asthma   . Barrett's esophagus  02/09/2014  . CAD (coronary artery disease)    a. prior MI in 2005 with 60-70% dLAD stenosis and 60-70% D2 stenosis (concern for coronary vasospasm)  . Depression   . Dizziness   . Elevated LFTs   . Focal nodular hyperplasia of liver   . GERD (gastroesophageal reflux disease)   . Heart murmur    AS A CHILD  . Hepatic steatosis   . History of adenomatous polyp of colon 02/09/2014  . HLD (hyperlipidemia)   . HTN (hypertension)   . Liver hemangioma   . Migraine   . Migraine headache   . Myocardial infarction ([place])   . NASH (nonalcoholic steatohepatitis)    nash  . Palpitations   . Sinus complaint   . Stroke ([place])   . Unstable angina ([place]) 05/15/2017     Past Surgical History:  Procedure Laterality Date  . ABDOMINOPLASTY    . BLADDER SUSPENSION    . CERVICAL FUSION     C4, 5, 6  . COLONOSCOPY W/ BIOPSIES    . ESOPHAGOGASTRODUODENOSCOPY    . LEFT HEART CATH AND CORONARY ANGIOGRAPHY N/A 05/16/2017   Procedure: LEFT HEART CATH AND CORONARY ANGIOGRAPHY;  Surgeon: Troy Sine, MD;  Location: [facility];  Service: Cardiovascular;  Laterality: N/A;  . TONSILLECTOMY    . TUBAL LIGATION      Social History   Socioeconomic History  . Marital status: Married    Spouse name: Not on file  . Number of children: 1  . Years of education: Not on file  . Highest education level: Not on file  Occupational History  . Occupation: MECHANICAL DESIGNER    Employer: [employer]  Social Needs  . Financial resource strain: Not on file  . Food insecurity:    Worry: Not on file    Inability: Not on file  . Transportation needs:    Medical: Not on file    Non-medical: Not on file  Tobacco Use  . Smoking status: Former Smoker    Packs/day: 0.25    Years: 30.00    Pack years: 7.50    Types: Cigarettes  . Smokeless tobacco: Never Used  . Tobacco comment: restarted 3 weeks ago  Substance and Sexual Activity  . Alcohol use: Yes    Comment: occasional; 1 per day  . Drug  use: No  . Sexual activity: Not on file  Lifestyle  . Physical activity:    Days per week: Not on file    Minutes per session: Not on file  . Stress: Not on file  Relationships  . Social connections:    Talks on phone: Not on file    Gets together: Not on file    Attends religious service: Not on file    Active member of club or organization: Not on file    Attends meetings of clubs or organizations: Not on file    Relationship status: Not on file  . Intimate partner violence:    Fear of current or ex partner: Not on file    Emotionally abused: Not on file    Physically abused: Not on file    Forced sexual activity: Not on file  Other Topics Concern  . Not on  file  Social History Narrative   She is married with 1 son   She is a Tax inspector at a factory   Former smoker   Alcohol is used at least once a week glass of wine   Denies drug use    Family History  Problem Relation Age of Onset  . Coronary artery disease Father   . Aneurysm Father        AAA  . Melanoma Father   . Transient ischemic attack Father   . Heart disease Father   . AAA (abdominal aortic aneurysm) Father   . Aneurysm Mother        AAA  . Colon polyps Mother   . Heart disease Mother   . Irritable bowel syndrome Mother   . Diverticulitis Mother   . Other Mother        Evlyn Clines  . AAA (abdominal aortic aneurysm) Mother   . Brain cancer Mother   . Breast cancer Paternal Aunt   . Liver disease Maternal Uncle   . Heart disease Brother   . Prostate cancer Paternal Uncle   . Mesothelioma Paternal Uncle   . Colon cancer Neg Hx      Current Outpatient Medications:  .  amLODipine (NORVASC) 10 MG tablet, Take 10 mg by mouth daily. , Disp: , Rfl:  .  aspirin EC 81 MG tablet, Take 81 mg by mouth daily., Disp: , Rfl:  .  benazepril (LOTENSIN) 20 MG tablet, TAKE 1 TABLET BY MOUTH EVERY DAY, Disp: 90 tablet, Rfl: 2 .  butorphanol (STADOL) 10 MG/ML nasal spray, INSTILL 1 SPRAY INTO ONE NOSTRIL  ONCE DAILY IF NEEDED, Disp: 2.5 mL, Rfl: 0 .  cetirizine (ZYRTEC) 10 MG tablet, Take 1 tablet (10 mg total) by mouth daily as needed (seasonal allergies). (Patient not taking: Reported on 11/21/2017), Disp: 30 tablet, Rfl: 3 .  clopidogrel (PLAVIX) 75 MG tablet, TAKE 1 TABLET BY MOUTH EVERY DAY, Disp: 90 tablet, Rfl: 2 .  ezetimibe (ZETIA) 10 MG tablet, Take 1 tablet (10 mg total) by mouth daily., Disp: 90 tablet, Rfl: 3 .  fluticasone (FLONASE) 50 MCG/ACT nasal spray, PLACE 2 SPRAYS INTO BOTH NOSTRILS DAILY AS NEEDED FOR SEASONAL ALLERGIES (Patient not taking: Reported on 11/21/2017), Disp: 16 g, Rfl: 11 .  gabapentin (NEURONTIN) 300 MG capsule, Take 1 capsule (300 mg total) by mouth 3 (three) times daily. (Patient not taking: Reported on 11/21/2017), Disp: 270 capsule, Rfl: 1 .  HYDROcodone-acetaminophen (NORCO/VICODIN) 5-325 MG tablet, Take 1 tablet by mouth every 6 (six) hours as needed for moderate pain., Disp: 30 tablet, Rfl: 0 .  ibuprofen (ADVIL,MOTRIN) 200 MG tablet, Take 400 mg by mouth 2 (two) times daily as needed for headache or moderate pain. , Disp: , Rfl:  .  metoprolol succinate (TOPROL XL) 25 MG 24 hr tablet, Take 0.5 tablets (12.5 mg total) by mouth daily., Disp: 45 tablet, Rfl: 3 .  nitroGLYCERIN (NITROSTAT) 0.4 MG SL tablet, Place 1 tablet (0.4 mg total) under the tongue every 5 (five) minutes as needed for chest pain. (Patient not taking: Reported on 11/21/2017), Disp: 25 tablet, Rfl: 3 .  pantoprazole (PROTONIX) 40 MG tablet, TAKE 1 TABLET BY MOUTH EVERY DAY BEFORE BREAKFAST, Disp: 90 tablet, Rfl: 3  Physical exam:  Vitals:   12/27/17 1340  BP: 127/87  Pulse: 76  Resp: 18  Temp: 97.7 F (36.5 C)  TempSrc: Tympanic  SpO2: 96%  Weight: 167 lb 12.8 oz (76.1 kg)  Height: 5' 8"  (  1.727 m)   Physical Exam  Constitutional: She is oriented to person, place, and time. She appears well-developed and well-nourished.  Face appears plethoric  HENT:  Head: Normocephalic and  atraumatic.  Eyes: Pupils are equal, round, and reactive to light. EOM are normal.  Neck: Normal range of motion.  Cardiovascular: Normal rate, regular rhythm and normal heart sounds.  Pulmonary/Chest: Effort normal and breath sounds normal.  Abdominal: Soft. Bowel sounds are normal.  Neurological: She is alert and oriented to person, place, and time.  Skin: Skin is warm and dry.     CMP Latest Ref Rng & Units 11/19/2017  Glucose 65 - 99 mg/dL -  BUN 6 - 20 mg/dL -  Creatinine 0.44 - 1.00 mg/dL -  Sodium 135 - 145 mmol/L -  Potassium 3.5 - 5.1 mmol/L -  Chloride 101 - 111 mmol/L -  CO2 22 - 32 mmol/L -  Calcium 8.9 - 10.3 mg/dL -  Total Protein 6.0 - 8.5 g/dL 7.5  Total Bilirubin 0.0 - 1.2 mg/dL 0.6  Alkaline Phos 39 - 117 IU/L 84  AST 0 - 40 IU/L 78(H)  ALT 0 - 32 IU/L 90(H)   CBC Latest Ref Rng & Units 12/27/2017  WBC 3.6 - 11.0 K/uL 13.2(H)  Hemoglobin 12.0 - 16.0 g/dL 18.6(H)  Hematocrit 35.0 - 47.0 % 55.3(H)  Platelets 150 - 440 K/uL 313    No images are attached to the encounter.  Ct Bone Marrow Biopsy & Aspiration  Result Date: 12/13/2017 CLINICAL DATA:  Polycythemia EXAM: CT GUIDED DEEP ILIAC BONE ASPIRATION AND CORE BIOPSY TECHNIQUE: The procedure, risks (including but not limited to bleeding, infection, organ damage ), benefits, and alternatives were explained to the patient. Questions regarding the procedure were encouraged and answered. The patient understands and consents to the procedure. Patient was placed supine on the CT gantry and limited axial scans through the pelvis were obtained. Appropriate skin entry site was identified. Skin site was marked, prepped with chlorhexidine, draped in usual sterile fashion, and infiltrated locally with 1% lidocaine. Intravenous Fentanyl and Versed were administered as conscious sedation during continuous monitoring of the patient's level of consciousness and physiological / cardiorespiratory status by the radiology RN, with a  total moderate sedation time of 12 minutes. Under CT fluoroscopic guidance an 11-gauge Cook trocar bone needle was advanced into the right iliac bone just lateral to the sacroiliac joint. Once needle tip position was confirmed, core and aspiration samples were obtained, submitted to pathology for approval. Post procedure scans show no hematoma or fracture. Patient tolerated procedure well. COMPLICATIONS: COMPLICATIONS none IMPRESSION: 1. Technically successful CT guided right iliac bone core and aspiration biopsy. Electronically Signed   By: Lucrezia Europe M.D.   On: 12/13/2017 11:57     Assessment and plan- Patient is a 52 y.o. female with secondary polycythemia here to discuss the results of bone marrow biopsy  Bone marrow biopsy does not reveal any evidence of primary myeloproliferative neoplasm.  Also her Jak 2 mutation testing was negative which argues against polycythemia vera.  Patient smoked heavily in the past and then quit for some time but resorted back to smoking recently.  Her hemoglobin was starting to trend up slowly despite smoking cessation in the past.  Because of her polycythemia remains unclear but this is unlikely a primary process given the negative marrow.  Her CBC today shows hematocrit of 54.  She is also significantly fatigued.  She will therefore proceed with weekly phlebotomy at this time  with a goal hematocrit of less than 50.  She will continue weekly phlebotomy and I will see her back in 4 weeks time.  We will see if her symptoms of fatigue improved after phlebotomy.    Patient has a history of thoracic aneurysm for which she sees Dr. Servando Snare and will be getting a CTA.  I will review that.  She also has a history of focal nodular hyperplasia of the liver.  Her last CT abdomen was in 2018.  She follows up with GI for this.  I will also get a repeat CT abdomen at this time to look for any tumor associated polycythemia on that would be highly unlikely given the low EPO  levels   Visit Diagnosis 1. Polycythemia, secondary      Dr. Randa Evens, MD, MPH [facility] [phone] 12/27/2017 2:22 PM

## 2018-01-06 ENCOUNTER — Inpatient Hospital Stay: Payer: 59

## 2018-01-06 ENCOUNTER — Inpatient Hospital Stay: Payer: 59 | Attending: Oncology

## 2018-01-06 VITALS — BP 150/100 | HR 66 | Temp 95.7°F | Resp 18

## 2018-01-06 DIAGNOSIS — D751 Secondary polycythemia: Secondary | ICD-10-CM | POA: Diagnosis not present

## 2018-01-06 LAB — CBC WITH DIFFERENTIAL/PLATELET
Basophils Absolute: 0.1 10*3/uL (ref 0–0.1)
Basophils Relative: 1 %
EOS ABS: 0.1 10*3/uL (ref 0–0.7)
Eosinophils Relative: 1 %
HEMATOCRIT: 54.3 % — AB (ref 35.0–47.0)
Hemoglobin: 18.1 g/dL — ABNORMAL HIGH (ref 12.0–16.0)
LYMPHS ABS: 3.1 10*3/uL (ref 1.0–3.6)
Lymphocytes Relative: 27 %
MCH: 31.8 pg (ref 26.0–34.0)
MCHC: 33.4 g/dL (ref 32.0–36.0)
MCV: 95.2 fL (ref 80.0–100.0)
MONOS PCT: 7 %
Monocytes Absolute: 0.9 10*3/uL (ref 0.2–0.9)
NEUTROS ABS: 7.5 10*3/uL — AB (ref 1.4–6.5)
Neutrophils Relative %: 64 %
Platelets: 303 10*3/uL (ref 150–440)
RBC: 5.71 MIL/uL — AB (ref 3.80–5.20)
RDW: 14.7 % — ABNORMAL HIGH (ref 11.5–14.5)
WBC: 11.7 10*3/uL — AB (ref 3.6–11.0)

## 2018-01-06 NOTE — Progress Notes (Signed)
1450: See VS flow sheet, Dr. Janese Banks notified of B/P, pt states no symptoms of high b/p present, and that "I forgot to take my blood pressure medication this morning". Pt states she has medication with her and pt took home medication at this time. Dr. Janese Banks notified and per MD okay to proceed with phlebotomy.  Therapeutic phlebotomy performed per MD order using a 20 gauge PIV in left AC removing a total of 250 cc starting at 1504 and ending at 1515. Pt tolerated procedure well. Pt stable at discharge.

## 2018-01-14 ENCOUNTER — Inpatient Hospital Stay: Payer: 59

## 2018-01-14 DIAGNOSIS — D751 Secondary polycythemia: Secondary | ICD-10-CM

## 2018-01-14 LAB — CBC WITH DIFFERENTIAL/PLATELET
BASOS ABS: 0.1 10*3/uL (ref 0–0.1)
BASOS PCT: 1 %
Eosinophils Absolute: 0.1 10*3/uL (ref 0–0.7)
Eosinophils Relative: 1 %
HEMATOCRIT: 53 % — AB (ref 35.0–47.0)
HEMOGLOBIN: 17.8 g/dL — AB (ref 12.0–16.0)
Lymphocytes Relative: 40 %
Lymphs Abs: 4 10*3/uL — ABNORMAL HIGH (ref 1.0–3.6)
MCH: 31.9 pg (ref 26.0–34.0)
MCHC: 33.6 g/dL (ref 32.0–36.0)
MCV: 95 fL (ref 80.0–100.0)
MONOS PCT: 7 %
Monocytes Absolute: 0.6 10*3/uL (ref 0.2–0.9)
NEUTROS ABS: 5.1 10*3/uL (ref 1.4–6.5)
NEUTROS PCT: 51 %
Platelets: 294 10*3/uL (ref 150–440)
RBC: 5.58 MIL/uL — ABNORMAL HIGH (ref 3.80–5.20)
RDW: 14.3 % (ref 11.5–14.5)
WBC: 9.9 10*3/uL (ref 3.6–11.0)

## 2018-01-15 ENCOUNTER — Ambulatory Visit
Admission: RE | Admit: 2018-01-15 | Discharge: 2018-01-15 | Disposition: A | Discharge status: Home / Self Care | Payer: 59 | Source: Ambulatory Visit | Attending: Oncology | Admitting: Oncology

## 2018-01-15 DIAGNOSIS — E278 Other specified disorders of adrenal gland: Secondary | ICD-10-CM | POA: Diagnosis not present

## 2018-01-15 DIAGNOSIS — D259 Leiomyoma of uterus, unspecified: Secondary | ICD-10-CM | POA: Insufficient documentation

## 2018-01-15 DIAGNOSIS — D751 Secondary polycythemia: Secondary | ICD-10-CM | POA: Insufficient documentation

## 2018-01-15 DIAGNOSIS — K760 Fatty (change of) liver, not elsewhere classified: Secondary | ICD-10-CM | POA: Insufficient documentation

## 2018-01-15 LAB — POCT I-STAT CREATININE: CREATININE: 0.7 mg/dL (ref 0.44–1.00)

## 2018-01-15 MED ORDER — IOPAMIDOL (ISOVUE-300) INJECTION 61%
100.0000 mL | Freq: Once | INTRAVENOUS | Status: AC | PRN
  Administered 2018-01-15: 100 mL via INTRAVENOUS

## 2018-01-21 ENCOUNTER — Inpatient Hospital Stay: Payer: 59

## 2018-01-21 ENCOUNTER — Other Ambulatory Visit: Payer: Self-pay

## 2018-01-21 DIAGNOSIS — D751 Secondary polycythemia: Secondary | ICD-10-CM | POA: Diagnosis not present

## 2018-01-21 LAB — CBC WITH DIFFERENTIAL/PLATELET
BASOS ABS: 0.1 10*3/uL (ref 0–0.1)
Basophils Relative: 1 %
EOS PCT: 0 %
Eosinophils Absolute: 0 10*3/uL (ref 0–0.7)
HCT: 51.2 % — ABNORMAL HIGH (ref 35.0–47.0)
Hemoglobin: 17.4 g/dL — ABNORMAL HIGH (ref 12.0–16.0)
LYMPHS PCT: 35 %
Lymphs Abs: 3.6 10*3/uL (ref 1.0–3.6)
MCH: 32.2 pg (ref 26.0–34.0)
MCHC: 34 g/dL (ref 32.0–36.0)
MCV: 94.6 fL (ref 80.0–100.0)
MONO ABS: 0.8 10*3/uL (ref 0.2–0.9)
MONOS PCT: 7 %
Neutro Abs: 5.9 10*3/uL (ref 1.4–6.5)
Neutrophils Relative %: 57 %
PLATELETS: 294 10*3/uL (ref 150–440)
RBC: 5.42 MIL/uL — ABNORMAL HIGH (ref 3.80–5.20)
RDW: 14.3 % (ref 11.5–14.5)
WBC: 10.5 10*3/uL (ref 3.6–11.0)

## 2018-01-28 ENCOUNTER — Other Ambulatory Visit: Payer: Self-pay

## 2018-01-28 ENCOUNTER — Encounter: Payer: Self-pay | Admitting: Oncology

## 2018-01-28 ENCOUNTER — Inpatient Hospital Stay: Payer: 59

## 2018-01-28 ENCOUNTER — Inpatient Hospital Stay: Payer: 59 | Attending: Oncology | Admitting: Oncology

## 2018-01-28 VITALS — BP 116/84 | HR 83 | Temp 97.1°F | Resp 18 | Ht 68.0 in | Wt 163.9 lb

## 2018-01-28 VITALS — BP 116/84 | HR 77 | Resp 18

## 2018-01-28 DIAGNOSIS — M1990 Unspecified osteoarthritis, unspecified site: Secondary | ICD-10-CM | POA: Diagnosis not present

## 2018-01-28 DIAGNOSIS — D751 Secondary polycythemia: Secondary | ICD-10-CM | POA: Insufficient documentation

## 2018-01-28 DIAGNOSIS — Z8673 Personal history of transient ischemic attack (TIA), and cerebral infarction without residual deficits: Secondary | ICD-10-CM | POA: Diagnosis not present

## 2018-01-28 DIAGNOSIS — I252 Old myocardial infarction: Secondary | ICD-10-CM | POA: Insufficient documentation

## 2018-01-28 DIAGNOSIS — E785 Hyperlipidemia, unspecified: Secondary | ICD-10-CM | POA: Insufficient documentation

## 2018-01-28 DIAGNOSIS — F329 Major depressive disorder, single episode, unspecified: Secondary | ICD-10-CM | POA: Insufficient documentation

## 2018-01-28 DIAGNOSIS — I700 Atherosclerosis of aorta: Secondary | ICD-10-CM | POA: Insufficient documentation

## 2018-01-28 DIAGNOSIS — I10 Essential (primary) hypertension: Secondary | ICD-10-CM | POA: Insufficient documentation

## 2018-01-28 DIAGNOSIS — R634 Abnormal weight loss: Secondary | ICD-10-CM | POA: Diagnosis not present

## 2018-01-28 DIAGNOSIS — R5383 Other fatigue: Secondary | ICD-10-CM | POA: Diagnosis not present

## 2018-01-28 DIAGNOSIS — Z87891 Personal history of nicotine dependence: Secondary | ICD-10-CM | POA: Insufficient documentation

## 2018-01-28 DIAGNOSIS — I712 Thoracic aortic aneurysm, without rupture: Secondary | ICD-10-CM | POA: Insufficient documentation

## 2018-01-28 DIAGNOSIS — I2510 Atherosclerotic heart disease of native coronary artery without angina pectoris: Secondary | ICD-10-CM | POA: Insufficient documentation

## 2018-01-28 DIAGNOSIS — J45909 Unspecified asthma, uncomplicated: Secondary | ICD-10-CM | POA: Insufficient documentation

## 2018-01-28 DIAGNOSIS — Z79899 Other long term (current) drug therapy: Secondary | ICD-10-CM | POA: Insufficient documentation

## 2018-01-28 DIAGNOSIS — F419 Anxiety disorder, unspecified: Secondary | ICD-10-CM | POA: Insufficient documentation

## 2018-01-28 DIAGNOSIS — K219 Gastro-esophageal reflux disease without esophagitis: Secondary | ICD-10-CM | POA: Diagnosis not present

## 2018-01-28 DIAGNOSIS — K7581 Nonalcoholic steatohepatitis (NASH): Secondary | ICD-10-CM | POA: Insufficient documentation

## 2018-01-28 LAB — CBC WITH DIFFERENTIAL/PLATELET
BASOS ABS: 0.1 10*3/uL (ref 0–0.1)
BASOS PCT: 1 %
EOS ABS: 0 10*3/uL (ref 0–0.7)
Eosinophils Relative: 0 %
HEMATOCRIT: 51.3 % — AB (ref 35.0–47.0)
Hemoglobin: 17.1 g/dL — ABNORMAL HIGH (ref 12.0–16.0)
Lymphocytes Relative: 15 %
Lymphs Abs: 2.3 10*3/uL (ref 1.0–3.6)
MCH: 31.3 pg (ref 26.0–34.0)
MCHC: 33.3 g/dL (ref 32.0–36.0)
MCV: 93.9 fL (ref 80.0–100.0)
Monocytes Absolute: 0.9 10*3/uL (ref 0.2–0.9)
Monocytes Relative: 6 %
NEUTROS ABS: 11.5 10*3/uL — AB (ref 1.4–6.5)
Neutrophils Relative %: 78 %
PLATELETS: 319 10*3/uL (ref 150–440)
RBC: 5.46 MIL/uL — ABNORMAL HIGH (ref 3.80–5.20)
RDW: 14.3 % (ref 11.5–14.5)
WBC: 14.7 10*3/uL — AB (ref 3.6–11.0)

## 2018-01-28 NOTE — Progress Notes (Signed)
Patient c/o light headed

## 2018-01-30 ENCOUNTER — Ambulatory Visit
Admission: RE | Admit: 2018-01-30 | Discharge: 2018-01-30 | Disposition: A | Discharge status: Home / Self Care | Payer: 59 | Source: Ambulatory Visit | Attending: Cardiothoracic Surgery | Admitting: Cardiothoracic Surgery

## 2018-01-30 ENCOUNTER — Ambulatory Visit: Payer: 59 | Admitting: Cardiothoracic Surgery

## 2018-01-30 DIAGNOSIS — I712 Thoracic aortic aneurysm, without rupture, unspecified: Secondary | ICD-10-CM

## 2018-01-30 MED ORDER — IOPAMIDOL (ISOVUE-370) INJECTION 76%
75.0000 mL | Freq: Once | INTRAVENOUS | Status: AC | PRN
  Administered 2018-01-30: 75 mL via INTRAVENOUS

## 2018-01-31 ENCOUNTER — Ambulatory Visit (INDEPENDENT_AMBULATORY_CARE_PROVIDER_SITE_OTHER): Payer: 59 | Admitting: Cardiothoracic Surgery

## 2018-01-31 ENCOUNTER — Encounter: Payer: Self-pay | Admitting: Cardiothoracic Surgery

## 2018-01-31 ENCOUNTER — Other Ambulatory Visit: Payer: Self-pay

## 2018-01-31 VITALS — BP 120/90 | HR 70 | Resp 18 | Ht 68.0 in | Wt 162.0 lb

## 2018-01-31 DIAGNOSIS — I712 Thoracic aortic aneurysm, without rupture, unspecified: Secondary | ICD-10-CM

## 2018-01-31 DIAGNOSIS — I719 Aortic aneurysm of unspecified site, without rupture: Secondary | ICD-10-CM

## 2018-01-31 NOTE — Progress Notes (Signed)
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 She has been referred to [place] for evaluation of polycythemia.35patient has had a chronically elevated hemoglobin and back in 2015 her H&H was 16.2/48.1. Most recently in January 2019 her H&H was 17.1/49.2 then 18.4/53.7 in April 2019.Her main complaint today is excessive fatigue. She reports a slight lacy rash in the medial aspect of her left knee. She denies any blood in her urine. Reports that her appetite is good and she denies any unintentional weight loss. She does not do any anabolic steroids. She reports getting a good night sleep and wakes up refreshed in the morning. She does not remember being told that she snores at night. She does not have any known chronic lung disease.  Results of blood work from 08/22/2017 were as follows: CBC showed white count of 11.5, H&H of 17.9/52.5 with an MCV of 89.6 and a platelet count of 293. E Po level was low at 1.5. Jak 2, CALR and MPL mutation testing was negative.  Exon 12 testing was also negative. Urinalysis did not reveal any hematuria. CMP showed elevated AST and ALT of 63 and 107 respectively. carboxyhb levels were elevated at 9  When bone marrow biopsy on 12/13/2017 showed hypercellular bone marrow with trilineage hematopoiesis.  There were no morphologic features diagnostic of myeloproliferative  neoplasm seen.  Cytogenetic studies were normal  Ct abdomen did not reveal any malignancy.   Interval history- reports that despite having phlebotomy she has not had any improvement in her fatigue. She barely has energy to do her chores. Appetite is poor. She has lost 10 pounds in 3 months  ECOG PS- 1 Pain scale- 0 Opioid associated constipation- no  Review of systems- Review of Systems  Constitutional: Positive for malaise/fatigue and weight loss. Negative for chills and fever.       Lack of appetite  HENT: Negative for congestion, ear discharge and nosebleeds.   Eyes: Negative for blurred vision.  Respiratory: Negative for cough, hemoptysis, sputum production, shortness of breath and wheezing.   Cardiovascular: Negative for chest pain, palpitations, orthopnea and claudication.  Gastrointestinal: Negative for abdominal pain, blood in stool, constipation, diarrhea, heartburn, melena, nausea and vomiting.  Genitourinary: Negative for dysuria, flank pain, frequency, hematuria and urgency.  Musculoskeletal: Negative for back pain, joint pain and myalgias.  Skin: Negative for rash.  Neurological: Negative for dizziness, tingling, focal weakness, seizures, weakness and headaches.  Endo/Heme/Allergies: Does not bruise/bleed easily.  Psychiatric/Behavioral: Negative for depression and suicidal ideas. The patient does not have insomnia.       Allergies  Allergen Reactions  . Aggrenox [Aspirin-Dipyridamole Er] Tinitus  . Atorvastatin Other (See Comments)    Myalgias   . Codeine Itching and Other (See Comments)    Nose itches  . Imitrex [Sumatriptan Base] Swelling and Other (See Comments)    Throat swelling  . Isosorbide Nitrate Other (See Comments)    headaches  .  Other Other (See Comments)    Artificial sweeteners cause heart palpitations (stevia is ok)  . Rosuvastatin Other (See Comments)    Restless legs  . Simvastatin Other (See Comments)    Myalgias     Past Medical  History:  Diagnosis Date  . Adenoma of right adrenal gland    Benign  . Allergy    SEASONAL  . Anemia   . Anxiety   . Arthritis   . Asthma   . Barrett's esophagus 02/09/2014  . CAD (coronary artery disease)    a. prior MI in 2005 with 60-70% dLAD stenosis and 60-70% D2 stenosis (concern for coronary vasospasm)  . Depression   . Dizziness   . Elevated LFTs   . Focal nodular hyperplasia of liver   . GERD (gastroesophageal reflux disease)   . Heart murmur    AS A CHILD  . Hepatic steatosis   . History of adenomatous polyp of colon 02/09/2014  . HLD (hyperlipidemia)   . HTN (hypertension)   . Liver hemangioma   . Migraine   . Migraine headache   . Myocardial infarction ([place])   . NASH (nonalcoholic steatohepatitis)    nash  . Palpitations   . Sinus complaint   . Stroke ([place])   . Unstable angina ([place]) 05/15/2017     Past Surgical History:  Procedure Laterality Date  . ABDOMINOPLASTY    . BLADDER SUSPENSION    . CERVICAL FUSION     C4, 5, 6  . COLONOSCOPY W/ BIOPSIES    . ESOPHAGOGASTRODUODENOSCOPY    . LEFT HEART CATH AND CORONARY ANGIOGRAPHY N/A 05/16/2017   Procedure: LEFT HEART CATH AND CORONARY ANGIOGRAPHY;  Surgeon: Troy Sine, MD;  Location: [facility];  Service: Cardiovascular;  Laterality: N/A;  . TONSILLECTOMY    . TUBAL LIGATION      Social History   Socioeconomic History  . Marital status: Married    Spouse name: Not on file  . Number of children: 1  . Years of education: Not on file  . Highest education level: Not on file  Occupational History  . Occupation: MECHANICAL DESIGNER    Employer: [employer]  Social Needs  . Financial resource strain: Not on file  . Food insecurity:    Worry: Not on file    Inability: Not on file  . Transportation needs:    Medical: Not on file    Non-medical: Not on file  Tobacco Use  . Smoking status: Former Smoker    Packs/day: 0.25    Years: 30.00    Pack years: 7.50    Types: Cigarettes    . Smokeless tobacco: Never Used  . Tobacco comment: restarted 3 weeks ago  Substance and Sexual Activity  . Alcohol use: Yes    Comment: occasional; 1 per day  . Drug use: No  . Sexual activity: Not on file  Lifestyle  . Physical activity:    Days per week: Not on file    Minutes per session: Not on file  . Stress: Not on file  Relationships  . Social connections:    Talks on phone: Not on file    Gets together: Not on file    Attends religious service: Not on file    Active member of club or organization: Not on file    Attends meetings of clubs or organizations: Not on file    Relationship status: Not on file  . Intimate partner violence:    Fear  of current or ex partner: Not on file    Emotionally abused: Not on file    Physically abused: Not on file    Forced sexual activity: Not on file  Other Topics Concern  . Not on file  Social History Narrative   She is married with 1 son   She is a Tax inspector at a factory   Former smoker   Alcohol is used at least once a week glass of wine   Denies drug use    Family History  Problem Relation Age of Onset  . Coronary artery disease Father   . Aneurysm Father        AAA  . Melanoma Father   . Transient ischemic attack Father   . Heart disease Father   . AAA (abdominal aortic aneurysm) Father   . Aneurysm Mother        AAA  . Colon polyps Mother   . Heart disease Mother   . Irritable bowel syndrome Mother   . Diverticulitis Mother   . Other Mother        Evlyn Clines  . AAA (abdominal aortic aneurysm) Mother   . Brain cancer Mother   . Breast cancer Paternal Aunt   . Liver disease Maternal Uncle   . Heart disease Brother   . Prostate cancer Paternal Uncle   . Mesothelioma Paternal Uncle   . Colon cancer Neg Hx      Current Outpatient Medications:  .  amLODipine (NORVASC) 10 MG tablet, Take 10 mg by mouth daily. , Disp: , Rfl:  .  aspirin EC 81 MG tablet, Take 81 mg by mouth daily., Disp: , Rfl:  .   benazepril (LOTENSIN) 20 MG tablet, TAKE 1 TABLET BY MOUTH EVERY DAY, Disp: 90 tablet, Rfl: 2 .  clopidogrel (PLAVIX) 75 MG tablet, TAKE 1 TABLET BY MOUTH EVERY DAY, Disp: 90 tablet, Rfl: 2 .  metoprolol succinate (TOPROL XL) 25 MG 24 hr tablet, Take 0.5 tablets (12.5 mg total) by mouth daily., Disp: 45 tablet, Rfl: 3 .  pantoprazole (PROTONIX) 40 MG tablet, TAKE 1 TABLET BY MOUTH EVERY DAY BEFORE BREAKFAST, Disp: 90 tablet, Rfl: 3 .  butorphanol (STADOL) 10 MG/ML nasal spray, INSTILL 1 SPRAY INTO ONE NOSTRIL ONCE DAILY IF NEEDED (Patient not taking: Reported on 12/27/2017), Disp: 2.5 mL, Rfl: 0 .  cetirizine (ZYRTEC) 10 MG tablet, Take 1 tablet (10 mg total) by mouth daily as needed (seasonal allergies). (Patient not taking: Reported on 11/21/2017), Disp: 30 tablet, Rfl: 3 .  ezetimibe (ZETIA) 10 MG tablet, Take 1 tablet (10 mg total) by mouth daily., Disp: 90 tablet, Rfl: 3 .  fluticasone (FLONASE) 50 MCG/ACT nasal spray, PLACE 2 SPRAYS INTO BOTH NOSTRILS DAILY AS NEEDED FOR SEASONAL ALLERGIES (Patient not taking: Reported on 11/21/2017), Disp: 16 g, Rfl: 11 .  gabapentin (NEURONTIN) 300 MG capsule, Take 1 capsule (300 mg total) by mouth 3 (three) times daily. (Patient not taking: Reported on 11/21/2017), Disp: 270 capsule, Rfl: 1 .  HYDROcodone-acetaminophen (NORCO/VICODIN) 5-325 MG tablet, Take 1 tablet by mouth every 6 (six) hours as needed for moderate pain. (Patient not taking: Reported on 12/27/2017), Disp: 30 tablet, Rfl: 0 .  ibuprofen (ADVIL,MOTRIN) 200 MG tablet, Take 400 mg by mouth 2 (two) times daily as needed for headache or moderate pain. , Disp: , Rfl:  .  nitroGLYCERIN (NITROSTAT) 0.4 MG SL tablet, Place 1 tablet (0.4 mg total) under the tongue every 5 (five) minutes as needed for chest pain. (  Patient not taking: Reported on 11/21/2017), Disp: 25 tablet, Rfl: 3  Physical exam:  Vitals:   01/28/18 1323  BP: 116/84  Pulse: 83  Resp: 18  Temp: (!) 97.1 F (36.2 C)  TempSrc: Tympanic    SpO2: 96%  Weight: 163 lb 14.4 oz (74.3 kg)  Height: 5' 8"  (1.727 m)   Physical Exam  Constitutional: She is oriented to person, place, and time. She appears well-developed and well-nourished.  HENT:  Head: Normocephalic and atraumatic.  Eyes: Pupils are equal, round, and reactive to light. EOM are normal.  Neck: Normal range of motion.  Cardiovascular: Normal rate, regular rhythm and normal heart sounds.  Pulmonary/Chest: Effort normal and breath sounds normal.  Abdominal: Soft. Bowel sounds are normal.  Neurological: She is alert and oriented to person, place, and time.  Skin: Skin is warm and dry.     CMP Latest Ref Rng & Units 01/15/2018  Glucose 65 - 99 mg/dL -  BUN 6 - 20 mg/dL -  Creatinine 0.44 - 1.00 mg/dL 0.70  Sodium 135 - 145 mmol/L -  Potassium 3.5 - 5.1 mmol/L -  Chloride 101 - 111 mmol/L -  CO2 22 - 32 mmol/L -  Calcium 8.9 - 10.3 mg/dL -  Total Protein 6.0 - 8.5 g/dL -  Total Bilirubin 0.0 - 1.2 mg/dL -  Alkaline Phos 39 - 117 IU/L -  AST 0 - 40 IU/L -  ALT 0 - 32 IU/L -   CBC Latest Ref Rng & Units 01/28/2018  WBC 3.6 - 11.0 K/uL 14.7(H)  Hemoglobin 12.0 - 16.0 g/dL 17.1(H)  Hematocrit 35.0 - 47.0 % 51.3(H)  Platelets 150 - 440 K/uL 319    No images are attached to the encounter.  Ct Abdomen Pelvis W Contrast  Result Date: 01/15/2018 CLINICAL DATA:  Polycythemia, hair loss, weight loss, fatigue, decreased appetite. EXAM: CT ABDOMEN AND PELVIS WITH CONTRAST TECHNIQUE: Multidetector CT imaging of the abdomen and pelvis was performed using the standard protocol following bolus administration of intravenous contrast. CONTRAST:  169m ISOVUE-300 IOPAMIDOL (ISOVUE-300) INJECTION 61% COMPARISON:  MRI abdomen dated 08/14/2015 FINDINGS: Lower chest: Lung bases are clear. 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 Regional geographic hepatic steatosis in the right hepatic  lobe. Stable 1.7 cm lesion with peripheral nodular enhancement in segment 3 (series 2/image 20), likely reflecting a benign hemangioma when corresponding with prior MRI. Gallbladder is unremarkable. No intrahepatic or extrahepatic ductal dilatation. Pancreas: Within normal limits. Spleen: Within normal limits. Adrenals/Urinary Tract: Bilateral adrenal nodules, measuring up to 12 mm on the right, technically indeterminate but stable, likely reflecting benign adrenal adenomas. Kidneys are within normal limits.  No hydronephrosis. Bladder is thick-walled although underdistended. Stomach/Bowel: Stomach is within normal limits. No evidence of bowel obstruction. Normal appendix (series 2/image 60). Vascular/Lymphatic: No evidence of abdominal aortic aneurysm. Atherosclerotic calcifications of the abdominal aorta and branch vessels. No suspicious abdominopelvic lymphadenopathy. Reproductive: Uterine fibroids, including a dominant 3.5 cm subserosal fibroid in the left uterine fundus (series 2/image 70). Left ovary is unremarkable.  3.4 cm right ovarian cyst/follicle. Other: No abdominopelvic ascites. Musculoskeletal: Visualized osseous structures are within normal limits. IMPRESSION: No findings suspicious for malignancy. Stable 4.5 cm mass in the right hepatic lobe, suggestive of benign FNH. Additional probable hemangioma in the left hepatic lobe. Regional hepatic steatosis. Bilateral adrenal nodules, measuring up to 12 mm, stable and likely reflecting benign adrenal adenomas. Uterine fibroids. Electronically Signed  By: Julian Hy M.D.   On: 01/15/2018 13:26   Ct Angio Chest Aorta W/cm &/or Wo/cm  Result Date: 01/30/2018 CLINICAL DATA:  Thoracic aortic aneurysm. Carotid dissection. Follow-up. EXAM: CT ANGIOGRAPHY CHEST WITH CONTRAST TECHNIQUE: Multidetector CT imaging of the chest was performed using the standard protocol during bolus administration of intravenous contrast. Multiplanar CT image reconstructions  and MIPs were obtained to evaluate the vascular anatomy. CONTRAST:  32m ISOVUE-370 IOPAMIDOL (ISOVUE-370) INJECTION 76% COMPARISON:  01/17/2017 FINDINGS: Cardiovascular: Stable mild aneurysmal dilatation of the ascending thoracic aorta, 4 cm in the proximal ascending aorta. No change. 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 Heart is normal size. Moderate coronary artery calcifications. Scattered aortic calcifications. Mediastinum/Nodes: No mediastinal, hilar, or axillary adenopathy. Lungs/Pleura: Mild biapical scarring and paraseptal emphysema. No confluent opacities or effusions. Upper Abdomen: Stable lesion in the right lobe of the liver measuring up to 4.3 cm. Small right adrenal nodule is stable. Musculoskeletal: No acute bony abnormality. Review of the MIP images confirms the above findings. IMPRESSION: Stable mild aneurysmal dilatation of the ascending thoracic aorta, 4 cm maximally. Recommend annual imaging followup by CTA or MRA. This recommendation follows 2010 ACCF/AHA/AATS/ACR/ASA/SCA/SCAI/SIR/STS/SVM Guidelines for the Diagnosis and Management of Patients with Thoracic Aortic Disease. Circulation. 2010; 121: [id number] focal dissection in the proximal left common carotid artery. The larger lumen is now thrombosed, presumably the false lumen with mild-to-moderate narrowing of the true lumen. Mild biapical scarring. 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 Coronary artery disease.  Aortic atherosclerosis. Electronically Signed   By: KRolm BaptiseM.D.   On: 01/30/2018 10:31     Assessment and plan- Patient is a 52y.o. 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  Jak 2 mutation testing was negative.  Patient does not have primary polycythemia.  Patient was noted to have polycythemia even before she started smoking.  She does not report any symptoms of obstructive sleep apnea but I would recommend that her primary care doctor should look into investigating obstructive sleep apnea as a possible cause of her polycythemia.  Obstructive sleep apnea and undiagnosed can also lead to chronic fatigue.  Patient reports no improvement in her symptoms despite a phlebotomy.  I will keep her goal hematocrit for phlebotomy to be greater than 50.  She will get phlebotomy today.  However I will hold off on further phlebotomies and repeat her CBC with differential in 1 month's time for a possible phlebotomy and see her at that time.  Patient reports persistent fatigue loss of appetite as well as weight loss.  No discernible malignancy so far.  Her CT abdomen and pelvis was negative for malignancy and bone marrow biopsy also did not reveal any evidence of myeloproliferative process or lymphoma or leukemia.  She did get a repeat CT chest on 01/30/2018 2 days after her appointment which I reviewed today and again that does not show any evidence of malignancy.  Recommend follow-up with PCP to evaluate her ongoing symptoms of fatigue.  Consider ruling out endocrinopathies such as adrenal disorders.   Visit Diagnosis 1. Polycythemia, secondary   2. Other fatigue   3. Abnormal weight loss  Dr. Randa Evens, MD, MPH [facility] [phone] 01/31/2018 9:21 AM

## 2018-01-31 NOTE — Progress Notes (Signed)
[address]       [place] [address]             [phone]                    Emmarie T Sampath Troy Medical Record #[id number] Date of Birth: Oct 11, 1971  Referring: Pieter Partridge, DO Primary Care: Susy Frizzle, MD  Chief Complaint:    Chief Complaint  Patient presents with  . Thoracic Aortic Aneurysm    52 year f/u with CTA Chest    History of Present Illness:  Since last seen the patient has had increasing problems with polycythemia.  She has returned to smoking.  She also notes extreme fatigue to the point where she is now unable to work.  She has been recently seen hematology and primary care to evaluate the symptoms.      The patient has been followed in the office  for Abnormal CT scan of the neck done 03/29/2016 and repeated in March 2018.  She has a known history of vascular disease including having a myocardial infarction in 2005 treated medically. In the spring of 2017 patient noted visual changes in her eyes with a "crescent shaped" visual defect but she could not remember which. Subsequently she had episode of left hand numbness which lasted about 30 seconds and numbness of the left side of her mouth and face. She also describes visual field deficit toward the left visual field transiently in the summer. Since these episodes she has been placed on Plavix, she had been on long-term aspirin. She been intolerant of multiple statins in the past.  Because of these symptoms she was seen by neurology, MRI of the brain and neck was performed was an abnormality in the proximal left carotid origin and a CT scan/CTA of the neck was performed.  Family history is significant for abdominal aortic aneurysms in both her mother and her father which were repaired with open techniques.  There is no family history of dissection   Current Activity/ Functional Status:  Patient is independent with mobility/ambulation, transfers, ADL's, IADL's.   Zubrod  Score: 52 year At the time of surgery this patient's most appropriate activity status/level should be described as: []     0    Normal activity, no symptoms [x]     1    Restricted in physical strenuous activity but ambulatory, able to do out light work []     2    Ambulatory and capable of self care, unable to do work activities, up and about               >50 % of waking hours                              []     3    Only limited self care, in bed greater than 50% of waking hours []     4    Completely disabled, no self care, confined to bed or chair []     5    Moribund   Past Medical History:  Diagnosis Date  . 52Adenoma of right adrenal gland    Benign  . Allergy    SEASONAL  . Anemia   . Anxiety   . Arthritis   . Asthma   . Barrett's esophagus 02/09/2014  . CAD (coronary artery disease)    a. prior MI in 2005 with  60-70% dLAD stenosis and 60-70% D2 stenosis (concern for coronary vasospasm)  . Depression   . Dizziness   . Elevated LFTs   . Focal nodular hyperplasia of liver   . GERD (gastroesophageal reflux disease)   . Heart murmur    AS A CHILD  . Hepatic steatosis   . History of adenomatous polyp of colon 02/09/2014  . HLD (hyperlipidemia)   . HTN (hypertension)   . Liver hemangioma   . Migraine   . Migraine headache   . Myocardial infarction ([place])   . NASH (nonalcoholic steatohepatitis)    nash  . Palpitations   . Sinus complaint   . Stroke ([place])   . Unstable angina ([place]) 05/15/2017    Past Surgical History:  Procedure Laterality Date  . ABDOMINOPLASTY    . BLADDER SUSPENSION    . CERVICAL FUSION     C4, 5, 6  . COLONOSCOPY W/ BIOPSIES    . ESOPHAGOGASTRODUODENOSCOPY    . LEFT HEART CATH AND CORONARY ANGIOGRAPHY N/A 05/16/2017   Procedure: LEFT HEART CATH AND CORONARY ANGIOGRAPHY;  Surgeon: Troy Sine, MD;  Location: [facility];  Service: Cardiovascular;  Laterality: N/A;  . TONSILLECTOMY    . TUBAL LIGATION      Family History  Problem Relation Age of  Onset  . Coronary artery disease Father   . Aneurysm Father        AAA  . Melanoma Father   . Transient ischemic attack Father   . Heart disease Father   . AAA (abdominal aortic aneurysm) Father   . Aneurysm Mother        AAA  . Colon polyps Mother   . Heart disease Mother   . Irritable bowel syndrome Mother   . Diverticulitis Mother   . Other Mother        Evlyn Clines  . AAA (abdominal aortic aneurysm) Mother   . Brain cancer Mother   . Breast cancer Paternal Aunt   . Liver disease Maternal Uncle   . Heart disease Brother   . Prostate cancer Paternal Uncle   . Mesothelioma Paternal Uncle   . Colon cancer Neg Hx    Both of the patient's parents had abdominal aortic aneurysm repaired in the past   Social History   Socioeconomic History  . Marital status: Married    Spouse name: Not on file  . Number of children: 1  . Years of education: Not on file  . Highest education level: Not on file  Occupational History  . Occupation: MECHANICAL DESIGNER    Employer: [employer]  Social Needs  . Financial resource strain: Not on file  . Food insecurity:    Worry: Not on file    Inability: Not on file  . Transportation needs:    Medical: Not on file    Non-medical: Not on file  Tobacco Use  . Smoking status: Former Smoker    Packs/day: 0.25    Years: 30.00    Pack years: 7.50    Types: Cigarettes  . Smokeless tobacco: Never Used  . Tobacco comment: restarted 3 weeks ago  Substance and Sexual Activity  . Alcohol use: Yes    Comment: occasional; 1 per day  . Drug use: No  . Sexual activity: Not on file  Lifestyle  . Physical activity:    Days per week: Not on file    Minutes per session: Not on file  . Stress: Not on file  Relationships  .  Social connections:    Talks on phone: Not on file    Gets together: Not on file    Attends religious service: Not on file    Active member of club or organization: Not on file    Attends meetings of clubs or  organizations: Not on file    Relationship status: Not on file  . Intimate partner violence:    Fear of current or ex partner: Not on file    Emotionally abused: Not on file    Physically abused: Not on file    Forced sexual activity: Not on file  Other Topics Concern  . Not on file  Social History Narrative   She is married with 1 son   She is a Tax inspector at a factory   Former smoker   Alcohol is used at least once a week glass of wine   Denies drug use    Social History   Tobacco Use  Smoking Status Former Smoker  . Packs/day: 0.25  . Years: 30.00  . Pack years: 7.50  . Types: Cigarettes  Smokeless Tobacco Never Used  Tobacco Comment   restarted 3 weeks ago    Social History   Substance and Sexual Activity  Alcohol Use Yes   Comment: occasional; 1 per day     Allergies  Allergen Reactions  . Aggrenox [Aspirin-Dipyridamole Er] Tinitus  . Atorvastatin Other (See Comments)    Myalgias   . Codeine Itching and Other (See Comments)    Nose itches  . Imitrex [Sumatriptan Base] Swelling and Other (See Comments)    Throat swelling  . Isosorbide Nitrate Other (See Comments)    headaches  . Other Other (See Comments)    Artificial sweeteners cause heart palpitations (stevia is ok)  . Rosuvastatin Other (See Comments)    Restless legs  . Simvastatin Other (See Comments)    Myalgias    Current Outpatient Medications  Medication Sig Dispense Refill  . amLODipine (NORVASC) 10 MG tablet Take 10 mg by mouth daily.     Marland Kitchen aspirin EC 81 MG tablet Take 81 mg by mouth daily.    . benazepril (LOTENSIN) 20 MG tablet TAKE 1 TABLET BY MOUTH EVERY DAY 90 tablet 2  . butorphanol (STADOL) 10 MG/ML nasal spray INSTILL 1 SPRAY INTO ONE NOSTRIL ONCE DAILY IF NEEDED 2.5 mL 0  . clopidogrel (PLAVIX) 75 MG tablet TAKE 1 TABLET BY MOUTH EVERY DAY 90 tablet 2  . HYDROcodone-acetaminophen (NORCO/VICODIN) 5-325 MG tablet Take 1 tablet by mouth every 6 (six) hours as needed for  moderate pain. 30 tablet 0  . ibuprofen (ADVIL,MOTRIN) 200 MG tablet Take 400 mg by mouth 2 (two) times daily as needed for headache or moderate pain.     . metoprolol succinate (TOPROL XL) 25 MG 24 hr tablet Take 0.5 tablets (12.5 mg total) by mouth daily. 45 tablet 3  . nitroGLYCERIN (NITROSTAT) 0.4 MG SL tablet Place 1 tablet (0.4 mg total) under the tongue every 5 (five) minutes as needed for chest pain. 25 tablet 3  . pantoprazole (PROTONIX) 40 MG tablet TAKE 1 TABLET BY MOUTH EVERY DAY BEFORE BREAKFAST 90 tablet 3   No current facility-administered medications for this visit.       Review of Systems:     Cardiac Review of Systems: Y or N  Chest Pain Aqua.Slicker ]  Resting SOB [ N ] Exertional SOB  [Y ]  Orthopnea [  ]   Pedal Edema [  N]    Palpitations Aqua.Slicker ] Syncope  [N]   Presyncope Aqua.Slicker ]  General Review of Systems: [Y] = yes [  ]=no Constitional: recent weight change [n  ];  Wt loss over the last 3 months [   ] anorexia [  ]; fatigue [ y ]; nausea [  ]; night sweats [  ]; fever [  ]; or chills [  ];          Dental: poor dentition[  ]; Last Dentist visit:   Eye : blurred vision [  ]; diplopia [   ]; vision changes [  ];  Amaurosis fugax[  ]; Resp: cough [  ];  wheezing[y  ];  hemoptysis[ n ]; shortness of breath[y  ]; paroxysmal nocturnal dyspnea[  ]; dyspnea on exertion[ y ]; or orthopnea[  ];  GI:  gallstones[  ], vomiting[  ];  dysphagia[  ]; melena[  ];  hematochezia [  ]; heartburn[  ];   Hx of  Colonoscopy[  ]; GU: kidney stones [  ]; hematuria[  ];   dysuria [  ];  nocturia[  ];  history of     obstruction [  ]; urinary frequency [  ]             Skin: rash, swelling[  ];, hair loss[  ];  peripheral edema[  ];  or itching[  ]; Musculosketetal: myalgias[  ];  joint swelling[  ];  joint erythema[  ];  joint pain[  ];  back pain[  ];  Heme/Lymph: bruising[  ];  bleeding[  ];  anemia[  ];  Neuro: TIA[ Y ];  headaches[  ];  stroke[  t];  vertigo[  n];  seizures[n  ];   paresthesias[ n ];   difficulty walking[ny  ];  Psych:depression[  ]; anxiety[  ];  Endocrine: diabetes[n  ];  thyroid dysfunction[  ];  Immunizations: Flu up to date [  ]; Pneumococcal up to date [  ];  Other:  Physical Exam: BP 120/90 (BP Location: Left Arm, Patient Position: Sitting, Cuff Size: Normal)   Pulse 70   Resp 18   Ht 5' 8"  (1.727 m)   Wt 162 lb (73.5 kg)   SpO2 97% Comment: RA  BMI 24.63 kg/m  Blood pressure was in the left arm.  PHYSICAL EXAMINATION: General appearance: alert, cooperative and appears older than stated age Head: Normocephalic, without obvious abnormality, atraumatic Neck: no adenopathy, no carotid bruit, no JVD, supple, symmetrical, trachea midline and thyroid not enlarged, symmetric, no tenderness/mass/nodules Lymph nodes: Cervical, supraclavicular, and axillary nodes normal. Resp: clear to auscultation bilaterally Back: symmetric, no curvature. ROM normal. No CVA tenderness. Cardio: regular rate and rhythm, S1, S2 normal, no murmur, click, rub or gallop GI: soft, non-tender; bowel sounds normal; no masses,  no organomegaly Extremities: extremities normal, atraumatic, no cyanosis or edema and Homans sign is negative, no sign of DVT Neurologic: Grossly normal Patient has no palpable popliteal aneurysm bilaterally  Diagnostic Studies & Laboratory data:     Recent Radiology Findings:   Ct Angio Chest Aorta W/cm &/or Wo/cm  Result Date: 01/30/2018 CLINICAL DATA:  Thoracic aortic aneurysm. Carotid dissection. Follow-up. EXAM: CT ANGIOGRAPHY CHEST WITH CONTRAST TECHNIQUE: Multidetector CT imaging of the chest was performed using the standard protocol during bolus administration of intravenous contrast. Multiplanar CT image reconstructions and MIPs were obtained to evaluate the vascular anatomy. CONTRAST:  33m ISOVUE-370 IOPAMIDOL (ISOVUE-370) INJECTION 76% COMPARISON:  01/17/2017 FINDINGS: Cardiovascular: Stable  mild aneurysmal dilatation of the ascending thoracic aorta, 4  cm in the proximal ascending aorta. No change. 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 Heart is normal size. Moderate coronary artery calcifications. Scattered aortic calcifications. Mediastinum/Nodes: No mediastinal, hilar, or axillary adenopathy. Lungs/Pleura: Mild biapical scarring and paraseptal emphysema. No confluent opacities or effusions. Upper Abdomen: Stable lesion in the right lobe of the liver measuring up to 4.3 cm. Small right adrenal nodule is stable. Musculoskeletal: No acute bony abnormality. Review of the MIP images confirms the above findings. IMPRESSION: Stable mild aneurysmal dilatation of the ascending thoracic aorta, 4 cm maximally. Recommend annual imaging followup by CTA or MRA. This recommendation follows 2010 ACCF/AHA/AATS/ACR/ASA/SCA/SCAI/SIR/STS/SVM Guidelines for the Diagnosis and Management of Patients with Thoracic Aortic Disease. Circulation. 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 The larger lumen is now thrombosed, presumably the false lumen with mild-to-moderate narrowing of the true lumen. Mild biapical scarring. 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 Coronary artery disease.  Aortic atherosclerosis. Electronically Signed   By: Rolm Baptise M.D.   On: 01/30/2018 10:31   I have independently reviewed the above radiology studies  and reviewed the findings with the patient.    Ct Angio Chest Aorta W/cm &/or Wo/cm  Result Date: 01/17/2017 CLINICAL DATA:  Carotid artery dissection EXAM: CT ANGIOGRAPHY CHEST WITH CONTRAST TECHNIQUE: Multidetector CT imaging of the chest was performed using the standard protocol during bolus administration of intravenous contrast. Multiplanar CT image reconstructions and MIPs  were obtained to evaluate the vascular anatomy. CONTRAST:  75 cc Isovue 370 COMPARISON:  07/17/2016 chest.  MR abdomen 08/14/2015 FINDINGS: Cardiovascular: Maximal diameter of the ascending aorta is 4.1 cm. There is no evidence of aortic dissection or intramural hematoma. Mild calcified plaque in the aortic arch and descending thoracic aorta. The focal dissection at the base of the left common carotid artery is stable. Both lumens opacified. Maximal diameter is 1.6 cm. This is stable based on my direct measurements on the prior study. There is only minimal narrowing of the true lumen. No new dissection. No evidence of extension of the dissection. Mild LAD territory coronary artery calcification. Mediastinum/Nodes: No evidence of abnormal mediastinal adenopathy. No pericardial effusion. Esophagus is unremarkable. Lungs/Pleura: Minimal dependent subsegmental atelectasis. No pneumothorax or pleural effusion. No lung mass. Upper Abdomen: Stable right lobe liver lesion. Focal enhancement in the lateral segment of the left lobe is likely a vascular phenomenon. Stable right adrenal nodule measuring 1.3 cm. Musculoskeletal: Stable L1 compression fracture. Review of the MIP images confirms the above findings. 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 There is only mild narrowing of the true lumen. Stable aneurysmal dilatation of the ascending aorta at 4.1 cm. Recommend annual imaging followup by CTA or MRA. This recommendation follows 2010 ACCF/AHA/AATS/ACR/ASA/SCA/SCAI/SIR/STS/SVM Guidelines for the Diagnosis and Management of Patients with Thoracic Aortic Disease. Circulation. 2010; 121: [id number] Chronic findings as described. Aortic Atherosclerosis (ICD10-I70.0). Electronically Signed   By: Marybelle Killings M.D.   On: 01/17/2017 12:48    Ct Angio Neck W Or Wo Contrast  Result Date: 07/19/2016 CLINICAL DATA:  52 year old female with aortic arch superiorly directed pseudoaneurysm,  situated between the brachiocephalic and left CCA origins, and discovered in 2017. Subsequent encounter. EXAM: CT  ANGIOGRAPHY NECK TECHNIQUE: Multidetector CT imaging of the neck was performed using the standard protocol during bolus administration of intravenous contrast. Multiplanar CT image reconstructions and MIPs were obtained to evaluate the vascular anatomy. Carotid stenosis measurements (when applicable) are obtained utilizing NASCET criteria, using the distal internal carotid diameter as the denominator. CONTRAST:  100 mL Isovue 370 in conjunction with contrast enhanced imaging of the chest reported separately. COMPARISON:  Neck CTA 03/29/2016. Chest CTA today reported separately. FINDINGS: Skeleton: Previous C5-C6 and C6-C7 ACDF. Fusion hardware and arthrodesis appears intact. No acute osseous abnormality identified. Visualized paranasal sinuses and mastoids are stable and well pneumatized. Upper chest: Chest CTA findings are reported separately today. Other neck: Thyroid, larynx, pharynx, parapharyngeal spaces, retropharyngeal space, sublingual space, submandibular glands and parotid glands are within normal limits. No cervical lymphadenopathy. Negative visualized brain parenchyma. Aortic arch: A saccular 13 x 11 x 17 mm (AP by transverse by CC) superiorly directed outpouching from the midportion of the aortic arch adjacent to or arising from the origin of the left common carotid artery appears stable in size and configuration since November (series 301, image 96 today versus series 603, image 104 in 2017). No surrounding soft tissue inflammation. The otherwise 3 vessel arch configuration remain stable. Ectasia of the ascending aorta appears grossly stable and is detailed on the chest study today. Right carotid system: Stable brachiocephalic artery with no stenosis. There is soft and calcified plaque at the brachiocephalic artery bifurcation into the right CCA and proximal right subclavian artery which  appears stable without stenosis. Negative right CCA proximal to the bifurcation. Soft and calcified plaque at the posterior right ICA origin and bulb is stable without stenosis. Mildly tortuous cervical right ICA. Negative visible right ICA siphon. Left carotid system: Less than 50 % narrowing at the left CCA origin (with respect to the distal vessel) related to mass effect from the adjacent arch lesion is stable. On axial images today the proximal left CCA and superiorly directed saccular lesion appear more convincingly contained within the same adventitia, see series 4, image 78. Beyond its origin the left CCA is negative until the bifurcation. At the bifurcation there is calcified and soft atherosclerotic plaque affecting the lateral and posterior walls of the right ICA origin and bulb with no stenosis. Tortuous cervical right ICA distal to the bulb is stable. Negative visible right ICA siphon. Vertebral arteries: No proximal right subclavian artery stenosis despite calcified plaque. Normal right vertebral artery origin. No proximal left subclavian artery stenosis despite mild soft and calcified plaque. Normal left vertebral artery origin. The left vertebral artery is dominant, but the right has a relatively normal size. No vertebral artery stenosis to the vertebrobasilar junction, and on the left there is only mild left V4 segment calcified plaque. Both PICA origins are patent. Tortuous but otherwise normal vertebrobasilar junction. Review of the MIP images confirms the above findings IMPRESSION: 1. 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 On axial images today this lesion and the adjacent proximal left CCA appear more convincingly contained within the same adventitia (series 4, image 78) suggesting it may have begun as a dissection of the left CCA origin. 2. Other Chest CTA findings  today are reported separately. 3. Stable mild bilateral ICA origin and bulb atherosclerosis without stenosis. Minimal calcified atherosclerosis of the dominant left vertebral artery V4 segment without stenosis. 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 Electronically Signed   By: Genevie Ann M.D.   On: 07/19/2016 12:03     Recent Lab Findings: Lab Results  Component Value Date   WBC 14.7 (H) 01/28/2018   HGB 17.1 (H) 01/28/2018   HCT 51.3 (H) 01/28/2018   PLT 319 01/28/2018   GLUCOSE 111 (H) 08/22/2017   CHOL 317 (H) 11/19/2017   TRIG 168 (H) 11/19/2017   HDL 81 11/19/2017   LDLDIRECT 173.6 12/17/2011   LDLCALC 202 (H) 11/19/2017   ALT 90 (H) 11/19/2017   AST 78 (H) 11/19/2017   NA 137 08/22/2017   K 4.6 08/22/2017   CL 102 08/22/2017   CREATININE 0.70 01/15/2018   BUN 24 (H) 08/22/2017   CO2 26 08/22/2017   TSH 3.64 08/13/2017   INR 0.87 12/13/2017   HGBA1C 5.8 (H) 08/16/2015   Aortic Size Index=     4.1     /Body surface area is 1.88 meters squared. 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    Grace Isaac MD      [address] [place] [address] Office [phone]   Beeper [phone]  01/31/2018 12:46 PM

## 2018-02-03 ENCOUNTER — Ambulatory Visit: Payer: 59 | Admitting: Family Medicine

## 2018-02-03 ENCOUNTER — Encounter: Payer: Self-pay | Admitting: Family Medicine

## 2018-02-03 VITALS — BP 132/100 | HR 80 | Temp 98.2°F | Resp 16 | Ht 68.0 in | Wt 163.0 lb

## 2018-02-03 DIAGNOSIS — F17200 Nicotine dependence, unspecified, uncomplicated: Secondary | ICD-10-CM | POA: Diagnosis not present

## 2018-02-03 DIAGNOSIS — D751 Secondary polycythemia: Secondary | ICD-10-CM

## 2018-02-03 DIAGNOSIS — R5382 Chronic fatigue, unspecified: Secondary | ICD-10-CM

## 2018-02-03 NOTE — Progress Notes (Signed)
Subjective:    Patient ID: Janet [name], female    DOB: [birth year], 52 y.o.   MRN: [id number]  HPI  08/13/17 Patient presents with greater than 4 months of pain in her right shoulder.  She has pain with abduction greater than 90 degrees.  She has pain with internal and external rotation.  She has a positive empty can sign.  She has a positive drop test.  She has pain with Hawkins maneuver.  She also has a positive O'Brien sign.  Passive range of motion is limited to approximately 100 degrees without pain.  She has decreased strength with resisted abduction. Patient also presents today with fatigue.  Patient states that she has no energy.  She easily becomes fatigued and tired with minimal activity.  She denies any chest pain.  She has no significant shortness of breath.  She denies any night sweats fevers or hemoptysis.  She does have a remote history of smoking.  However she has quit.  Her blood pressure has been relatively low at home.  She had a near presyncopal episode when her blood pressure was 100/60.  Cardiology just started her on metoprolol due to palpitations and her heart.  However she is also on amlodipine and she believes the blood pressure may be too low.  At that time, my plan was: Using sterile technique, I injected the right shoulder with 2 cc of lidocaine, 2 cc of Marcaine, and 2 cc of 40 mg/mL Kenalog.  Patient tolerated procedure well.  If pain does not improve a cortisone injection, I would recommend an MRI of the shoulder to evaluate for rotator cuff tear.  Given her chronic fatigue, I will begin the workup by obtaining baseline laboratory values including a CBC to monitor for anemia or any bone marrow abnormalities, CMP to evaluate for electrolyte dysfunctions or renal or hepatic disease.  I will check a TSH to evaluate for hypothyroidism.  I will check a vitamin B12 to evaluate for vitamin deficiency.  I will check a cortisol level to evaluate for adrenal insufficiency.  If  labs are completely normal, I will also check a sed rate to evaluate for autoimmune diseases.  If this is normal, I would begin a workup for malignancy starting with regular mammogram, colonoscopy, Pap smear, and CXR.  02/03/18 Patient was referred to Oncology for polycythemia.  I have copied Dr. Elroy Channel most recent A/P for a synopsis of the work up thus far:  Assessment and plan- Patient is a 52 y.o. 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  Jak 2 mutation testing was negative.  Patient does not have primary polycythemia.  Patient was noted to have polycythemia even before she started smoking.  She does not report any symptoms of obstructive sleep apnea but I would recommend that her primary care doctor should look into investigating obstructive sleep apnea as a possible cause of her polycythemia.  Obstructive sleep apnea and undiagnosed can also lead to chronic fatigue.  Patient reports no improvement in her symptoms despite a phlebotomy.  I will keep her goal hematocrit for phlebotomy to be greater than 50.  She will get phlebotomy today.  However I will hold off on further phlebotomies and repeat her CBC with differential in 1 month's time for a possible phlebotomy and see her at that time.  Patient reports persistent fatigue loss of  appetite as well as weight loss.  No discernible malignancy so far.  Her CT abdomen and pelvis was negative for malignancy and bone marrow biopsy also did not reveal any evidence of myeloproliferative process or lymphoma or leukemia.  She did get a repeat CT chest on 01/30/2018 2 days after her appointment which I reviewed today and again that does not show any evidence of malignancy.  Recommend follow-up with PCP to evaluate her ongoing symptoms of fatigue.  Consider ruling out endocrinopathies such  as adrenal disorders.   Patient continues to complain of severe fatigue.  She states she can barely even walk from her car into the building at times due to how weak and tired she feels.  She has no stamina.  She has very little energy.  Continues to smoke.  She denies any snoring or witnessed apneic events.  She does not believe she has sleep apnea.  This occurred suddenly earlier this spring after a camping trip.  She denies any rash consistent with Lyme disease however she states that it is possible.  She wants to be checked for vitamin B12 deficiency as well as celiac disease.  She does have severe diarrhea.  She is also reporting location.  She states that her hair falls out throughout her scalp and no specific area more than it ever has in the past.  There is no visible patches of hair loss but there is a generalized thinning of her hair.  She had a catheterization performed in January that showed noncritical coronary artery disease and ejection fraction of 65%.  She had a CT angiogram of the chest, as well as a CT of the abdomen and pelvis that revealed no specific cause that would explain her fatigue.  Past Medical History:  Diagnosis Date  . Adenoma of right adrenal gland    Benign  . Allergy    SEASONAL  . Anemia   . Anxiety   . Arthritis   . Asthma   . Barrett's esophagus 02/09/2014  . CAD (coronary artery disease)    a. prior MI in 2005 with 60-70% dLAD stenosis and 60-70% D2 stenosis (concern for coronary vasospasm)  . Depression   . Dizziness   . Elevated LFTs   . Focal nodular hyperplasia of liver   . GERD (gastroesophageal reflux disease)   . Heart murmur    AS A CHILD  . Hepatic steatosis   . History of adenomatous polyp of colon 02/09/2014  . HLD (hyperlipidemia)   . HTN (hypertension)   . Liver hemangioma   . Migraine   . Migraine headache   . Myocardial infarction ([place])   . NASH (nonalcoholic steatohepatitis)    nash  . Palpitations   . Sinus complaint   .  Stroke ([place])   . Unstable angina ([place]) 05/15/2017   Past Surgical History:  Procedure Laterality Date  . ABDOMINOPLASTY    . BLADDER SUSPENSION    . CERVICAL FUSION     C4, 5, 6  . COLONOSCOPY W/ BIOPSIES    . ESOPHAGOGASTRODUODENOSCOPY    . LEFT HEART CATH AND CORONARY ANGIOGRAPHY N/A 05/16/2017   Procedure: LEFT HEART CATH AND CORONARY ANGIOGRAPHY;  Surgeon: Troy Sine, MD;  Location: [facility];  Service: Cardiovascular;  Laterality: N/A;  . TONSILLECTOMY    . TUBAL LIGATION     Current Outpatient Medications on File Prior to Visit  Medication Sig Dispense Refill  . amLODipine (NORVASC) 10 MG tablet Take 10 mg by  mouth daily.     Marland Kitchen aspirin EC 81 MG tablet Take 81 mg by mouth daily.    . benazepril (LOTENSIN) 20 MG tablet TAKE 1 TABLET BY MOUTH EVERY DAY 90 tablet 2  . butorphanol (STADOL) 10 MG/ML nasal spray INSTILL 1 SPRAY INTO ONE NOSTRIL ONCE DAILY IF NEEDED 2.5 mL 0  . clopidogrel (PLAVIX) 75 MG tablet TAKE 1 TABLET BY MOUTH EVERY DAY 90 tablet 2  . HYDROcodone-acetaminophen (NORCO/VICODIN) 5-325 MG tablet Take 1 tablet by mouth every 6 (six) hours as needed for moderate pain. 30 tablet 0  . ibuprofen (ADVIL,MOTRIN) 200 MG tablet Take 400 mg by mouth 2 (two) times daily as needed for headache or moderate pain.     . metoprolol succinate (TOPROL XL) 25 MG 24 hr tablet Take 0.5 tablets (12.5 mg total) by mouth daily. 45 tablet 3  . nitroGLYCERIN (NITROSTAT) 0.4 MG SL tablet Place 1 tablet (0.4 mg total) under the tongue every 5 (five) minutes as needed for chest pain. 25 tablet 3  . pantoprazole (PROTONIX) 40 MG tablet TAKE 1 TABLET BY MOUTH EVERY DAY BEFORE BREAKFAST 90 tablet 3   No current facility-administered medications on file prior to visit.    Allergies  Allergen Reactions  . Aggrenox [Aspirin-Dipyridamole Er] Tinitus  . Atorvastatin Other (See Comments)    Myalgias   . Codeine Itching and Other (See Comments)    Nose itches  . Imitrex [Sumatriptan  Base] Swelling and Other (See Comments)    Throat swelling  . Isosorbide Nitrate Other (See Comments)    headaches  . Other Other (See Comments)    Artificial sweeteners cause heart palpitations (stevia is ok)  . Quinolones     Patient was warned about not using Cipro and similar antibiotics. Recent studies have raised concern that fluoroquinolone antibiotics could be associated with an increased risk of aortic aneurysm Fluoroquinolones have non-antimicrobial properties that might jeopardise the integrity of the extracellular matrix of the vascular wall In a  propensity score matched cohort study in [place], there was a 66% increased rate of aortic aneurysm or dissection associated with oral fluoroquinolone use, compared wit  . Rosuvastatin Other (See Comments)    Restless legs  . Simvastatin Other (See Comments)    Myalgias   Social History   Socioeconomic History  . Marital status: Married    Spouse name: Not on file  . Number of children: 1  . Years of education: Not on file  . Highest education level: Not on file  Occupational History  . Occupation: MECHANICAL DESIGNER    Employer: [employer]  Social Needs  . Financial resource strain: Not on file  . Food insecurity:    Worry: Not on file    Inability: Not on file  . Transportation needs:    Medical: Not on file    Non-medical: Not on file  Tobacco Use  . Smoking status: Former Smoker    Packs/day: 0.25    Years: 30.00    Pack years: 7.50    Types: Cigarettes  . Smokeless tobacco: Never Used  . Tobacco comment: restarted 3 weeks ago  Substance and Sexual Activity  . Alcohol use: Yes    Comment: occasional; 1 per day  . Drug use: No  . Sexual activity: Not on file  Lifestyle  . Physical activity:    Days per week: Not on file    Minutes per session: Not on file  . Stress: Not on file  Relationships  .  Social connections:    Talks on phone: Not on file    Gets together: Not on file    Attends religious  service: Not on file    Active member of club or organization: Not on file    Attends meetings of clubs or organizations: Not on file    Relationship status: Not on file  . Intimate partner violence:    Fear of current or ex partner: Not on file    Emotionally abused: Not on file    Physically abused: Not on file    Forced sexual activity: Not on file  Other Topics Concern  . Not on file  Social History Narrative   She is married with 1 son   She is a Tax inspector at a factory   Former smoker   Alcohol is used at least once a week glass of wine   Denies drug use      Review of Systems  All other systems reviewed and are negative.      Objective:   Physical Exam  Neck: No thyromegaly present.  Cardiovascular: Normal rate, regular rhythm and normal heart sounds.  Pulmonary/Chest: Effort normal and breath sounds normal. No respiratory distress. She has no wheezes. She has no rales.  Vitals reviewed.         Assessment & Plan:  Chronic fatigue - Plan: Celiac Disease Panel, Vitamin B12, Lyme Disease Abs IgG, IgM, IFA, CSF, Ambulatory referral to Sleep Studies, ACTH, Cortisol  Polycythemia  Smoking  I spent more than 25 minutes today with the patient discussing her work-up today, reviewing her medical records, and discussing possible causes of her fatigue.  I recommended a sleep study given the severity of her fatigue and her polycythemia.  Given the occurrence after a camping trip, I recommended Lyme titers to evaluate for possible chronic Lyme disease.  I will check a vitamin B12 although this was checked earlier this year and was found to be normal.  I will also check a cortisol level first thing in the morning in addition to an ACTH to determine if there is adrenal insufficiency.

## 2018-02-04 ENCOUNTER — Other Ambulatory Visit: Payer: 59

## 2018-02-04 DIAGNOSIS — R5382 Chronic fatigue, unspecified: Secondary | ICD-10-CM | POA: Diagnosis not present

## 2018-02-06 ENCOUNTER — Ambulatory Visit: Payer: 59 | Admitting: Neurology

## 2018-02-11 ENCOUNTER — Other Ambulatory Visit: Payer: Self-pay | Admitting: Family Medicine

## 2018-02-11 DIAGNOSIS — R768 Other specified abnormal immunological findings in serum: Secondary | ICD-10-CM

## 2018-02-12 ENCOUNTER — Other Ambulatory Visit: Payer: 59

## 2018-02-12 DIAGNOSIS — R768 Other specified abnormal immunological findings in serum: Secondary | ICD-10-CM

## 2018-02-12 LAB — TEST AUTHORIZATION 2

## 2018-02-12 LAB — LYME DISEASE ABS IGG, IGM, IFA, CSF

## 2018-02-12 LAB — CELIAC DISEASE PANEL
(TTG) AB, IGG: 1 U/mL
(tTG) Ab, IgA: 1 U/mL
GLIADIN IGA: 5 U
GLIADIN IGG: 1 U
Immunoglobulin A: 247 mg/dL (ref 47–310)

## 2018-02-12 LAB — CORTISOL: CORTISOL PLASMA: 20.6 ug/dL

## 2018-02-12 LAB — LYME AB SCREEN %: Lyme AB Screen: 0.97 index — ABNORMAL HIGH

## 2018-02-12 LAB — ACTH: C206 ACTH: 20 pg/mL (ref 6–50)

## 2018-02-12 LAB — B. BURGDORFI ANTIBODIES BY WB

## 2018-02-12 LAB — VITAMIN B12: Vitamin B-12: 615 pg/mL (ref 200–1100)

## 2018-02-17 LAB — B. BURGDORFI ANTIBODIES BY WB
B burgdorferi IgG Abs (IB): NEGATIVE
B burgdorferi IgM Abs (IB): NEGATIVE
LYME DISEASE 23 KD IGM: NONREACTIVE
LYME DISEASE 39 KD IGM: NONREACTIVE
LYME DISEASE 41 KD IGM: NONREACTIVE
LYME DISEASE 45 KD IGG: NONREACTIVE
LYME DISEASE 58 KD IGG: NONREACTIVE
LYME DISEASE 66 KD IGG: NONREACTIVE
LYME DISEASE 93 KD IGG: NONREACTIVE
Lyme Disease 18 kD IgG: REACTIVE — AB
Lyme Disease 23 kD IgG: NONREACTIVE
Lyme Disease 28 kD IgG: NONREACTIVE
Lyme Disease 30 kD IgG: NONREACTIVE
Lyme Disease 39 kD IgG: NONREACTIVE
Lyme Disease 41 kD IgG: NONREACTIVE

## 2018-02-18 ENCOUNTER — Encounter: Payer: Self-pay | Admitting: Neurology

## 2018-02-18 ENCOUNTER — Encounter: Payer: Self-pay | Admitting: *Deleted

## 2018-02-18 ENCOUNTER — Ambulatory Visit: Payer: 59 | Admitting: Neurology

## 2018-02-18 VITALS — BP 134/82 | HR 73 | Ht 68.0 in | Wt 164.0 lb

## 2018-02-18 DIAGNOSIS — Z720 Tobacco use: Secondary | ICD-10-CM | POA: Diagnosis not present

## 2018-02-18 DIAGNOSIS — D751 Secondary polycythemia: Secondary | ICD-10-CM | POA: Diagnosis not present

## 2018-02-18 DIAGNOSIS — R634 Abnormal weight loss: Secondary | ICD-10-CM | POA: Diagnosis not present

## 2018-02-18 NOTE — Progress Notes (Signed)
[facility]INE [facility]   Provider:  Larey [name], [place] D  Primary Care Physician:  Janet Frizzle, MD   Referring Provider: Susy Frizzle, MD / Janet Koch, DO   Chief Complaint  Patient presents with  . New Patient (Initial Visit)    pt alone, rm 10. pt states her PCP is completing a work up looking for lymes disease and they are wanting her to have sleep study completed. due to polycythemia. pt never had a sleep study before. patient denies snoring or known apneic events. complains of fatigue during the daytime.     HPI:  Janet [name] is a 52 y.o. female patient, seen here on 02-18-2018 upon referral from Janet [name] for evaluation of polycythemia, an possible sleep apnea or sleep hypoxemea.  [name] was diagnosed with polycythemia in April of this year, at the time she had successfully quit smoking but soon after relapsed.  She is currently smoking half pack per day. She feels very fatigued but reports she sleeps well, "hard".  She is losing hair and reports unintentional weight loss.  Last Colonoscopy was 3-4 years ago by Dr. Carlean [name], and an upper endoscopy was performed in 2018 - all normal. She stopped Protonix recently.  She has CAD at age 22 year, and has had an MI at age 66, she has had strokes , followed by Dr Janet [name].    Sleep habits are as follows: The patient reports that she is having a regular bedtime around 10 PM, goes to bed and falls asleep promptly.  She usually can stay asleep several hours - 5 - until a bathroom call wakes her.  She is not aware of snoring, she has not been told that she has apnea.  Her husband actually has confirmed to her that she does not snore when she asked. Her bedroom is described as cool, quiet and dark and she shares a bedroom with her husband, she stopped off sleeping prone but ends up waking when she is supine.  She sleeps on one pillow only.  The bed is flat, nonadjustable. She rises at 8 am. One nocturia. Wakes up refreshed -  no dry mouth, no headaches, no dizziness.     Sleep medical history and family sleep history: Father had strokes in his 32, has CHF. Mother passed of Glioblastoma  at age 40.  father had OSA, brother has OSA, sister snores. Brother and father had COPD.     Social history:  Active smoker, coffee 2 cups a day, ETOH - red wine, 3 glasses a week.  Used to work as a Furniture conservator/restorer, now a Tax inspector.    Review of Systems: Out of a complete 14 system review, the patient complains of only the following symptoms, and all other reviewed systems are negative.  Epworth score  0/ 24  , Fatigue severity score 63/ 63  , depression score depressed/ fatigued, frustrated.   Social History   Socioeconomic History  . Marital status: Married    Spouse name: Not on file  . Number of children: 1  . Years of education: Not on file  . Highest education level: Not on file  Occupational History  . Occupation: MECHANICAL DESIGNER    Employer: [employer]  Social Needs  . Financial resource strain: Not on file  . Food insecurity:    Worry: Not on file    Inability: Not on file  . Transportation needs:    Medical: Not on file    Non-medical:  Not on file  Tobacco Use  . Smoking status: Former Smoker    Packs/day: 0.25    Years: 30.00    Pack years: 7.50    Types: Cigarettes  . Smokeless tobacco: Never Used  . Tobacco comment: restarted 3 weeks ago  Substance and Sexual Activity  . Alcohol use: Yes    Comment: occasional; 1 per day  . Drug use: No  . Sexual activity: Not on file  Lifestyle  . Physical activity:    Days per week: Not on file    Minutes per session: Not on file  . Stress: Not on file  Relationships  . Social connections:    Talks on phone: Not on file    Gets together: Not on file    Attends religious service: Not on file    Active member of club or organization: Not on file    Attends meetings of clubs or organizations: Not on file    Relationship status: Not on  file  . Intimate partner violence:    Fear of current or ex partner: Not on file    Emotionally abused: Not on file    Physically abused: Not on file    Forced sexual activity: Not on file  Other Topics Concern  . Not on file  Social History Narrative   She is married with 1 son   She is a Tax inspector at a factory   Former smoker   Alcohol is used at least once a week glass of wine   Denies drug use    Family History  Problem Relation Age of Onset  . Coronary artery disease Father   . Aneurysm Father        AAA  . Melanoma Father   . Transient ischemic attack Father   . Heart disease Father   . AAA (abdominal aortic aneurysm) Father   . Aneurysm Mother        AAA  . Colon polyps Mother   . Heart disease Mother   . Irritable bowel syndrome Mother   . Diverticulitis Mother   . Other Mother        Evlyn Clines  . AAA (abdominal aortic aneurysm) Mother   . Brain cancer Mother   . Breast cancer Paternal Aunt   . Liver disease Maternal Uncle   . Heart disease Brother   . Prostate cancer Paternal Uncle   . Mesothelioma Paternal Uncle   . Colon cancer Neg Hx     Past Medical History:  Diagnosis Date  . Adenoma of right adrenal gland    Benign  . Allergy    SEASONAL  . Anemia   . Anxiety   . Arthritis   . Asthma   . Barrett's esophagus 02/09/2014  . CAD (coronary artery disease)    a. prior MI in 2005 with 60-70% dLAD stenosis and 60-70% D2 stenosis (concern for coronary vasospasm)  . Depression   . Dizziness   . Elevated LFTs   . Focal nodular hyperplasia of liver   . GERD (gastroesophageal reflux disease)   . Heart murmur    AS A CHILD  . Hepatic steatosis   . History of adenomatous polyp of colon 02/09/2014  . HLD (hyperlipidemia)   . HTN (hypertension)   . Liver hemangioma   . Migraine   . Migraine headache   . Myocardial infarction ([place])   . NASH (nonalcoholic steatohepatitis)    nash  . Palpitations   . Sinus complaint   .  Stroke ([place])     . Unstable angina ([place]) 05/15/2017    Past Surgical History:  Procedure Laterality Date  . ABDOMINOPLASTY    . BLADDER SUSPENSION    . CERVICAL FUSION     C4, 5, 6  . COLONOSCOPY W/ BIOPSIES    . ESOPHAGOGASTRODUODENOSCOPY    . LEFT HEART CATH AND CORONARY ANGIOGRAPHY N/A 05/16/2017   Procedure: LEFT HEART CATH AND CORONARY ANGIOGRAPHY;  Surgeon: Troy Sine, MD;  Location: [facility];  Service: Cardiovascular;  Laterality: N/A;  . TONSILLECTOMY    . TUBAL LIGATION      Current Outpatient Medications  Medication Sig Dispense Refill  . amLODipine (NORVASC) 10 MG tablet Take 10 mg by mouth daily.     Marland Kitchen aspirin EC 81 MG tablet Take 81 mg by mouth daily.    . benazepril (LOTENSIN) 20 MG tablet TAKE 1 TABLET BY MOUTH EVERY DAY 90 tablet 2  . butorphanol (STADOL) 10 MG/ML nasal spray INSTILL 1 SPRAY INTO ONE NOSTRIL ONCE DAILY IF NEEDED 2.5 mL 0  . clopidogrel (PLAVIX) 75 MG tablet TAKE 1 TABLET BY MOUTH EVERY DAY 90 tablet 2  . HYDROcodone-acetaminophen (NORCO/VICODIN) 5-325 MG tablet Take 1 tablet by mouth every 6 (six) hours as needed for moderate pain. 30 tablet 0  . ibuprofen (ADVIL,MOTRIN) 200 MG tablet Take 400 mg by mouth 2 (two) times daily as needed for headache or moderate pain.     . metoprolol succinate (TOPROL XL) 25 MG 24 hr tablet Take 0.5 tablets (12.5 mg total) by mouth daily. 45 tablet 3  . nitroGLYCERIN (NITROSTAT) 0.4 MG SL tablet Place 1 tablet (0.4 mg total) under the tongue every 5 (five) minutes as needed for chest pain. 25 tablet 3  . pantoprazole (PROTONIX) 40 MG tablet TAKE 1 TABLET BY MOUTH EVERY DAY BEFORE BREAKFAST 90 tablet 3   No current facility-administered medications for this visit.     Allergies as of 02/18/2018 - Review Complete 02/18/2018  Allergen Reaction Noted  . Aggrenox [aspirin-dipyridamole er] Tinitus 10/22/2012  . Atorvastatin Other (See Comments) 05/07/2016  . Codeine Itching and Other (See Comments)   . Imitrex [sumatriptan  base] Swelling and Other (See Comments) 08/01/2010  . Isosorbide nitrate Other (See Comments)   . Other Other (See Comments) 11/27/2014  . Quinolones  01/31/2018  . Rosuvastatin Other (See Comments)   . Simvastatin Other (See Comments) 05/07/2016    Vitals: BP 134/82   Pulse 73   Ht 5' 8"  (1.727 m)   Wt 164 lb (74.4 kg)   BMI 24.94 kg/m  Last Weight:  Wt Readings from Last 1 Encounters:  02/18/18 164 lb (74.4 kg)   XBL:TJQZ mass index is 24.94 kg/m.     Last Height:   Ht Readings from Last 1 Encounters:  02/18/18 5' 8"  (1.727 m)    Physical exam:  General: The patient is awake, alert and appears not in acute distress. The patient looks older than her numeric age, she smells of smoke. Head: Normocephalic, atraumatic. Neck is supple. Mallampati  2 - not inflamed,  neck circumference:15" . Nasal airflow congestion- seasonal ,  Retrognathia is seen.  Cardiovascular:  Regular rate and rhythm , without  murmurs or carotid bruit, and without distended neck veins. Respiratory: Lungs are clear to auscultation. Skin:  Facial erythema. Palmar erythema . Without evidence of edema,. Trunk: BMI is 25. The patient's posture is erect.   Neurologic exam : The patient is awake and alert, oriented  to place and time.   Attention span & concentration ability appears normal.  Speech is fluent,  without  dysarthria, dysphonia or aphasia.  Mood and affect-  Cranial nerves: Pupils are equal and briskly reactive to light. Funduscopic exam without evidence of pallor or edema. Extraocular movements  in vertical and horizontal planes intact and without nystagmus. Visual fields by finger perimetry are intact. Hearing to finger rub intact.  Facial sensation intact to fine touch.  Facial motor strength is symmetric and tongue and uvula move midline. Shoulder shrug was symmetrical.  Motor exam:   Reduced muscle  tone,  Loss of muscle bulk and symmetrically decreased strength in all extremities.  Sensory:   Fine touch, pinprick and vibration were intact in all extremities, both hands. She reports numbness and tinging spells. Proprioception tested in the upper extremities was normal. Coordination: Rapid alternating movements in the fingers/hands was normal. Finger-to-nose maneuver  normal without evidence of ataxia, dysmetria or tremor. Gait and station: Patient walks without assistive device.Tandem gait is unfragmented. Turns with 3 Steps.  Deep tendon reflexes: in the upper and lower extremities are symmetric and intact.   Assessment:  After physical and neurologic examination, review of laboratory studies,  Personal review of imaging studies, reports of other /same  Imaging studies, results of polysomnography and / or neurophysiology testing and pre-existing records as far as provided in visit., my assessment is   1) patient has a high risk of hypoemia due to smoking. Likely additional sinus and rhinitis , forcing her to mouth breath. I feel strongly that she needs a HST to rule out hypoxia. She may have COPD.   2) unintended weight loss, fatigue ( FS score the highest at 63 points) and feeling of strenght loss. - PCP searches for malignancy/ lyme, other disorders.   3) aside from smoking, any substance abuse ? No fasciculations. No sleep walking, parasomnia.    The patient was advised of the nature of the diagnosed disorder , the treatment options and the  risks for general health and wellness arising from not treating the condition.   I spent more than 45 minutes of face to face time with the patient.  Greater than 50% of time was spent in counseling and coordination of care. We have discussed the diagnosis and differential and I answered the patient's questions.    Plan:  Treatment plan and additional workup :  [facility] patient ) , check for hypoxemia.  Smoking cessation.    Janet Seat, MD [id number], 1:93 PM  Certified in Neurology by ABPN Certified in [place] by  [facility] [address], [place], [place] [address]

## 2018-02-20 ENCOUNTER — Encounter: Payer: Self-pay | Admitting: Family Medicine

## 2018-02-22 ENCOUNTER — Other Ambulatory Visit: Payer: Self-pay | Admitting: Family Medicine

## 2018-02-28 ENCOUNTER — Other Ambulatory Visit: Payer: Self-pay

## 2018-02-28 DIAGNOSIS — D751 Secondary polycythemia: Secondary | ICD-10-CM

## 2018-03-03 ENCOUNTER — Telehealth: Payer: Self-pay | Admitting: Family Medicine

## 2018-03-03 MED ORDER — VARENICLINE TARTRATE 1 MG PO TABS: 1.0000 mg | ORAL_TABLET | Freq: Two times a day (BID) | ORAL | 2 refills | Status: DC

## 2018-03-03 MED ORDER — VARENICLINE TARTRATE 0.5 MG X 11 & 1 MG X 42 PO MISC: ORAL | 0 refills | Status: DC

## 2018-03-03 NOTE — Telephone Encounter (Signed)
Patient needs chantix sent to [facility] if possible  [phone]

## 2018-03-03 NOTE — Telephone Encounter (Signed)
Starter and continuing pack sent to pharm

## 2018-03-04 ENCOUNTER — Encounter: Payer: Self-pay | Admitting: Oncology

## 2018-03-04 ENCOUNTER — Inpatient Hospital Stay: Payer: 59 | Attending: Oncology

## 2018-03-04 ENCOUNTER — Inpatient Hospital Stay (HOSPITAL_BASED_OUTPATIENT_CLINIC_OR_DEPARTMENT_OTHER): Payer: 59 | Admitting: Oncology

## 2018-03-04 ENCOUNTER — Inpatient Hospital Stay: Payer: 59

## 2018-03-04 VITALS — BP 125/91 | HR 80 | Resp 18

## 2018-03-04 VITALS — BP 130/93 | HR 75 | Temp 97.1°F | Resp 18 | Ht 68.0 in | Wt 163.1 lb

## 2018-03-04 DIAGNOSIS — Z7982 Long term (current) use of aspirin: Secondary | ICD-10-CM | POA: Insufficient documentation

## 2018-03-04 DIAGNOSIS — Z87891 Personal history of nicotine dependence: Secondary | ICD-10-CM | POA: Insufficient documentation

## 2018-03-04 DIAGNOSIS — D751 Secondary polycythemia: Secondary | ICD-10-CM

## 2018-03-04 DIAGNOSIS — Z79899 Other long term (current) drug therapy: Secondary | ICD-10-CM

## 2018-03-04 LAB — CBC WITH DIFFERENTIAL/PLATELET
Abs Immature Granulocytes: 0.04 10*3/uL (ref 0.00–0.07)
Basophils Absolute: 0.1 10*3/uL (ref 0.0–0.1)
Basophils Relative: 1 %
EOS ABS: 0.1 10*3/uL (ref 0.0–0.5)
EOS PCT: 1 %
HCT: 54 % — ABNORMAL HIGH (ref 36.0–46.0)
Hemoglobin: 17.5 g/dL — ABNORMAL HIGH (ref 12.0–15.0)
IMMATURE GRANULOCYTES: 0 %
LYMPHS ABS: 4.4 10*3/uL — AB (ref 0.7–4.0)
LYMPHS PCT: 40 %
MCH: 28.7 pg (ref 26.0–34.0)
MCHC: 32.4 g/dL (ref 30.0–36.0)
MCV: 88.5 fL (ref 80.0–100.0)
MONOS PCT: 6 %
Monocytes Absolute: 0.7 10*3/uL (ref 0.1–1.0)
NRBC: 0 % (ref 0.0–0.2)
Neutro Abs: 5.7 10*3/uL (ref 1.7–7.7)
Neutrophils Relative %: 52 %
PLATELETS: 323 10*3/uL (ref 150–400)
RBC: 6.1 MIL/uL — ABNORMAL HIGH (ref 3.87–5.11)
RDW: 13.9 % (ref 11.5–15.5)
WBC: 11.1 10*3/uL — ABNORMAL HIGH (ref 4.0–10.5)

## 2018-03-04 NOTE — Progress Notes (Signed)
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 She has been referred to [place] for evaluation of polycythemia.35patient has had a chronically elevated hemoglobin and back in 2015 her H&H was 16.2/48.1. Most recently in January 2019 her H&H was 17.1/49.2 then 18.4/53.7 in April 2019.Her main complaint today is excessive fatigue. She reports a slight lacy rash in the medial aspect of her left knee. She denies any blood in her urine. Reports that her appetite is good and she denies any unintentional weight loss. She does not do any anabolic steroids. She reports getting a good night sleep and wakes up refreshed in the morning. She does not remember being told that she snores at night. She does not have any known chronic lung disease.  Results of blood work from 08/22/2017 were as follows: CBC showed white count of 11.5, H&H of 17.9/52.5 with an MCV of 89.6 and a platelet count of 293. E Po level was low at 1.5. Jak 2, CALR and MPL mutation testing was negative. Exon 12 testing was also negative. Urinalysis did not reveal any hematuria. CMP showed elevated AST and ALT of 63 and 107 respectively. carboxyhb levels were elevated at 9  When bone marrow biopsy on 12/13/2017 showed hypercellular bone marrow with trilineage hematopoiesis. There were no morphologic features  diagnostic of myeloproliferative neoplasm seen. Cytogenetic studies were normal  Ct chest/ abdomen did not reveal any malignancy.    Interval history-still feels fatigued and states that she has no energy.  She was seen in the sleep clinic and was thought to have possible sleep apnea and will be undergoing home sleep study as well.  ECOG PS- 1 Pain scale- 0 Opioid associated constipation- no  Review of systems- Review of Systems  Constitutional: Negative for chills, fever, malaise/fatigue and weight loss.  HENT: Negative for congestion, ear discharge and nosebleeds.   Eyes: Negative for blurred vision.  Respiratory: Negative for cough, hemoptysis, sputum production, shortness of breath and wheezing.   Cardiovascular: Negative for chest pain, palpitations, orthopnea and claudication.  Gastrointestinal: Negative for abdominal pain, blood in stool, constipation, diarrhea, heartburn, melena, nausea and vomiting.  Genitourinary: Negative for dysuria, flank pain, frequency, hematuria and urgency.  Musculoskeletal: Negative for back pain, joint pain and myalgias.  Skin: Negative for rash.  Neurological: Negative for dizziness, tingling, focal weakness, seizures, weakness and headaches.  Endo/Heme/Allergies: Does not bruise/bleed easily.  Psychiatric/Behavioral: Negative for depression and suicidal ideas. The patient does not have insomnia.       Allergies  Allergen Reactions  . Aggrenox [Aspirin-Dipyridamole Er] Tinitus  . Atorvastatin Other (See Comments)    Myalgias   . Codeine Itching and Other (See Comments)    Nose itches  . Imitrex [Sumatriptan Base] Swelling and Other (See Comments)    Throat swelling  . Isosorbide Nitrate Other (See Comments)    headaches  . Other Other (See Comments)    Artificial sweeteners  cause heart palpitations (stevia is ok)  . Quinolones     Patient was warned about not using Cipro and similar antibiotics. Recent studies have raised concern that  fluoroquinolone antibiotics could be associated with an increased risk of aortic aneurysm Fluoroquinolones have non-antimicrobial properties that might jeopardise the integrity of the extracellular matrix of the vascular wall In a  propensity score matched cohort study in [place], there was a 66% increased rate of aortic aneurysm or dissection associated with oral fluoroquinolone use, compared wit  . Rosuvastatin Other (See Comments)    Restless legs  . Simvastatin Other (See Comments)    Myalgias     Past Medical History:  Diagnosis Date  . Adenoma of right adrenal gland    Benign  . Allergy    SEASONAL  . Anemia   . Anxiety   . Arthritis   . Asthma   . Barrett's esophagus 02/09/2014  . CAD (coronary artery disease)    a. prior MI in 2005 with 60-70% dLAD stenosis and 60-70% D2 stenosis (concern for coronary vasospasm)  . Depression   . Dizziness   . Elevated LFTs   . Focal nodular hyperplasia of liver   . GERD (gastroesophageal reflux disease)   . Heart murmur    AS A CHILD  . Hepatic steatosis   . History of adenomatous polyp of colon 02/09/2014  . HLD (hyperlipidemia)   . HTN (hypertension)   . Liver hemangioma   . Migraine   . Migraine headache   . Myocardial infarction ([place])   . NASH (nonalcoholic steatohepatitis)    nash  . Palpitations   . Sinus complaint   . Stroke ([place])   . Unstable angina ([place]) 05/15/2017     Past Surgical History:  Procedure Laterality Date  . ABDOMINOPLASTY    . BLADDER SUSPENSION    . CERVICAL FUSION     C4, 5, 6  . COLONOSCOPY W/ BIOPSIES    . ESOPHAGOGASTRODUODENOSCOPY    . LEFT HEART CATH AND CORONARY ANGIOGRAPHY N/A 05/16/2017   Procedure: LEFT HEART CATH AND CORONARY ANGIOGRAPHY;  Surgeon: Troy Sine, MD;  Location: [facility];  Service: Cardiovascular;  Laterality: N/A;  . TONSILLECTOMY    . TUBAL LIGATION      Social History   Socioeconomic History  . Marital status: Married    Spouse name: Not on file    . Number of children: 1  . Years of education: Not on file  . Highest education level: Not on file  Occupational History  . Occupation: MECHANICAL DESIGNER    Employer: [employer]  Social Needs  . Financial resource strain: Not on file  . Food insecurity:    Worry: Not on file    Inability: Not on file  . Transportation needs:    Medical: Not on file    Non-medical: Not on file  Tobacco Use  . Smoking status: Former Smoker    Packs/day: 0.25    Years: 30.00    Pack years: 7.50    Types: Cigarettes  . Smokeless tobacco: Never Used  . Tobacco comment: restarted 3 weeks ago  Substance and Sexual Activity  . Alcohol use: Yes    Comment: occasional; 1 per day  . Drug use: No  . Sexual activity: Not on file  Lifestyle  . Physical activity:    Days per week: Not on file    Minutes per session: Not on file  . Stress: Not on file  Relationships  .  Social connections:    Talks on phone: Not on file    Gets together: Not on file    Attends religious service: Not on file    Active member of club or organization: Not on file    Attends meetings of clubs or organizations: Not on file    Relationship status: Not on file  . Intimate partner violence:    Fear of current or ex partner: Not on file    Emotionally abused: Not on file    Physically abused: Not on file    Forced sexual activity: Not on file  Other Topics Concern  . Not on file  Social History Narrative   She is married with 1 son   She is a Tax inspector at a factory   Former smoker   Alcohol is used at least once a week glass of wine   Denies drug use    Family History  Problem Relation Age of Onset  . Coronary artery disease Father   . Aneurysm Father        AAA  . Melanoma Father   . Transient ischemic attack Father   . Heart disease Father   . AAA (abdominal aortic aneurysm) Father   . Aneurysm Mother        AAA  . Colon polyps Mother   . Heart disease Mother   . Irritable bowel syndrome  Mother   . Diverticulitis Mother   . Other Mother        Evlyn Clines  . AAA (abdominal aortic aneurysm) Mother   . Brain cancer Mother   . Breast cancer Paternal Aunt   . Liver disease Maternal Uncle   . Heart disease Brother   . Prostate cancer Paternal Uncle   . Mesothelioma Paternal Uncle   . Colon cancer Neg Hx      Current Outpatient Medications:  .  amLODipine (NORVASC) 10 MG tablet, Take 10 mg by mouth daily. , Disp: , Rfl:  .  aspirin EC 81 MG tablet, Take 81 mg by mouth daily., Disp: , Rfl:  .  benazepril (LOTENSIN) 20 MG tablet, TAKE 1 TABLET BY MOUTH EVERY DAY, Disp: 90 tablet, Rfl: 2 .  clopidogrel (PLAVIX) 75 MG tablet, TAKE 1 TABLET BY MOUTH EVERY DAY, Disp: 90 tablet, Rfl: 2 .  HYDROcodone-acetaminophen (NORCO/VICODIN) 5-325 MG tablet, Take 1 tablet by mouth every 6 (six) hours as needed for moderate pain., Disp: 30 tablet, Rfl: 0 .  metoprolol succinate (TOPROL XL) 25 MG 24 hr tablet, Take 0.5 tablets (12.5 mg total) by mouth daily., Disp: 45 tablet, Rfl: 3 .  pantoprazole (PROTONIX) 40 MG tablet, TAKE 1 TABLET BY MOUTH EVERY DAY BEFORE BREAKFAST, Disp: 90 tablet, Rfl: 3 .  amLODipine (NORVASC) 10 MG tablet, TAKE 5 MG BY MOUTH EVERY DAY, Disp: 90 tablet, Rfl: 1 .  butorphanol (STADOL) 10 MG/ML nasal spray, INSTILL 1 SPRAY INTO ONE NOSTRIL ONCE DAILY IF NEEDED (Patient not taking: Reported on 03/04/2018), Disp: 2.5 mL, Rfl: 0 .  ibuprofen (ADVIL,MOTRIN) 200 MG tablet, Take 400 mg by mouth 2 (two) times daily as needed for headache or moderate pain. , Disp: , Rfl:  .  nitroGLYCERIN (NITROSTAT) 0.4 MG SL tablet, Place 1 tablet (0.4 mg total) under the tongue every 5 (five) minutes as needed for chest pain. (Patient not taking: Reported on 03/04/2018), Disp: 25 tablet, Rfl: 3 .  varenicline (CHANTIX CONTINUING MONTH PAK) 1 MG tablet, Take 1 tablet (1 mg total) by mouth 2 (two)  times daily. (Patient not taking: Reported on 03/04/2018), Disp: 60 tablet, Rfl: 2 .  varenicline  (CHANTIX PAK) 0.5 MG X 11 & 1 MG X 42 tablet, Take as directed (Patient not taking: Reported on 03/04/2018), Disp: 53 tablet, Rfl: 0  Physical exam:  Vitals:   03/04/18 1316  BP: (!) 130/93  Pulse: 75  Resp: 18  Temp: (!) 97.1 F (36.2 C)  TempSrc: Tympanic  SpO2: 97%  Weight: 163 lb 1.6 oz (74 kg)  Height: 5' 8"  (1.727 m)   Physical Exam  Constitutional: She is oriented to person, place, and time. She appears well-developed and well-nourished.  HENT:  Head: Normocephalic and atraumatic.  Eyes: Pupils are equal, round, and reactive to light. EOM are normal.  Neck: Normal range of motion.  Cardiovascular: Normal rate, regular rhythm and normal heart sounds.  Pulmonary/Chest: Effort normal and breath sounds normal.  Abdominal: Soft. Bowel sounds are normal.  Neurological: She is alert and oriented to person, place, and time.  Skin: Skin is warm and dry.     CMP Latest Ref Rng & Units 01/15/2018  Glucose 65 - 99 mg/dL -  BUN 6 - 20 mg/dL -  Creatinine 0.44 - 1.00 mg/dL 0.70  Sodium 135 - 145 mmol/L -  Potassium 3.5 - 5.1 mmol/L -  Chloride 101 - 111 mmol/L -  CO2 22 - 32 mmol/L -  Calcium 8.9 - 10.3 mg/dL -  Total Protein 6.0 - 8.5 g/dL -  Total Bilirubin 0.0 - 1.2 mg/dL -  Alkaline Phos 39 - 117 IU/L -  AST 0 - 40 IU/L -  ALT 0 - 32 IU/L -   CBC Latest Ref Rng & Units 03/04/2018  WBC 4.0 - 10.5 K/uL 11.1(H)  Hemoglobin 12.0 - 15.0 g/dL 17.5(H)  Hematocrit 36.0 - 46.0 % 54.0(H)  Platelets 150 - 400 K/uL 323      Assessment and plan- Patient is a 52 y.o. female with secondary polycythemia possibly secondary to sleep smoking/possible obstructive sleep apnea  1.  Polycythemia: Patient does not have primary polycythemia given that she had a negative bone marrow as well as negative Jak 2 mutation testing.  Her hematocrit is 54 today and therefore she will proceed with phlebotomy today.  Repeat CBC in 2 weeks 4 weeks in 6 weeks and I will see her back in 6 weeks.  She gets  phlebotomy every 2 weeks if her hematocrit is more than 50  2.  Fatigue: She has undergone CT chest abdomen and pelvis which did not reveal any evidence of malignancy.  Bone marrow biopsy also did not reveal any evidence of lymphoma or leukemia.  She sees Dr. Dennard Schaumann was also done studies for Lyme disease and B12 celiac disease panel as well as cortisol testing which have all been normal.   Visit Diagnosis 1. Polycythemia, secondary      Dr. Randa Evens, MD, MPH [name] [phone] 03/04/2018 3:58 PM

## 2018-03-04 NOTE — Progress Notes (Signed)
Patient c/o SOB x 1 week / SOB comes and goes

## 2018-03-05 ENCOUNTER — Telehealth: Payer: Self-pay | Admitting: *Deleted

## 2018-03-05 MED ORDER — VARENICLINE TARTRATE 1 MG PO TABS: 1.0000 mg | ORAL_TABLET | Freq: Two times a day (BID) | ORAL | 2 refills | Status: DC

## 2018-03-05 NOTE — Telephone Encounter (Signed)
[facility] is reviewing your PA request. Typically an electronic response will be received within 72 hours. To check for an update later, open this request from your dashboard.    You may close this dialog and return to your dashboard to perform other tasks.

## 2018-03-05 NOTE — Telephone Encounter (Signed)
Received PA determination.   PA [id number] approved through 03/06/2019.

## 2018-03-05 NOTE — Telephone Encounter (Signed)
Received request from pharmacy for PA on Chantix  PA submitted.   [id number]

## 2018-03-14 ENCOUNTER — Ambulatory Visit: Payer: 59 | Admitting: Family Medicine

## 2018-03-14 ENCOUNTER — Encounter: Payer: Self-pay | Admitting: Family Medicine

## 2018-03-14 VITALS — BP 136/80 | HR 85 | Temp 98.5°F | Ht 68.0 in | Wt 164.0 lb

## 2018-03-14 DIAGNOSIS — L723 Sebaceous cyst: Secondary | ICD-10-CM | POA: Diagnosis not present

## 2018-03-14 DIAGNOSIS — M7551 Bursitis of right shoulder: Secondary | ICD-10-CM | POA: Diagnosis not present

## 2018-03-14 DIAGNOSIS — R5382 Chronic fatigue, unspecified: Secondary | ICD-10-CM | POA: Diagnosis not present

## 2018-03-14 DIAGNOSIS — F5101 Primary insomnia: Secondary | ICD-10-CM

## 2018-03-14 MED ORDER — ALPRAZOLAM 0.5 MG PO TABS: 0.5000 mg | ORAL_TABLET | Freq: Every evening | ORAL | 0 refills | Status: DC | PRN

## 2018-03-14 NOTE — Progress Notes (Signed)
Subjective:    Patient ID: Janet [name], female    DOB: [birth year], 52 y.o.   MRN: [id number]  HPI  08/13/17 Patient presents with greater than 4 months of pain in her right shoulder.  She has pain with abduction greater than 90 degrees.  She has pain with internal and external rotation.  She has a positive empty can sign.  She has a positive drop test.  She has pain with Hawkins maneuver.  She also has a positive O'Brien sign.  Passive range of motion is limited to approximately 100 degrees without pain.  She has decreased strength with resisted abduction. Patient also presents today with fatigue.  Patient states that she has no energy.  She easily becomes fatigued and tired with minimal activity.  She denies any chest pain.  She has no significant shortness of breath.  She denies any night sweats fevers or hemoptysis.  She does have a remote history of smoking.  However she has quit.  Her blood pressure has been relatively low at home.  She had a near presyncopal episode when her blood pressure was 100/60.  Cardiology just started her on metoprolol due to palpitations and her heart.  However she is also on amlodipine and she believes the blood pressure may be too low.  At that time, my plan was: Using sterile technique, I injected the right shoulder with 2 cc of lidocaine, 2 cc of Marcaine, and 2 cc of 40 mg/mL Kenalog.  Patient tolerated procedure well.  If pain does not improve a cortisone injection, I would recommend an MRI of the shoulder to evaluate for rotator cuff tear.  Given her chronic fatigue, I will begin the workup by obtaining baseline laboratory values including a CBC to monitor for anemia or any bone marrow abnormalities, CMP to evaluate for electrolyte dysfunctions or renal or hepatic disease.  I will check a TSH to evaluate for hypothyroidism.  I will check a vitamin B12 to evaluate for vitamin deficiency.  I will check a cortisol level to evaluate for adrenal insufficiency.  If  labs are completely normal, I will also check a sed rate to evaluate for autoimmune diseases.  If this is normal, I would begin a workup for malignancy starting with regular mammogram, colonoscopy, Pap smear, and CXR.  02/03/18 Patient was referred to Oncology for polycythemia.  I have copied Dr. Elroy Channel most recent A/P for a synopsis of the work up thus far:  Assessment and plan- Patient is a 52 y.o. 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  Jak 2 mutation testing was negative.  Patient does not have primary polycythemia.  Patient was noted to have polycythemia even before she started smoking.  She does not report any symptoms of obstructive sleep apnea but I would recommend that her primary care doctor should look into investigating obstructive sleep apnea as a possible cause of her polycythemia.  Obstructive sleep apnea and undiagnosed can also lead to chronic fatigue.  Patient reports no improvement in her symptoms despite a phlebotomy.  I will keep her goal hematocrit for phlebotomy to be greater than 50.  She will get phlebotomy today.  However I will hold off on further phlebotomies and repeat her CBC with differential in 1 month's time for a possible phlebotomy and see her at that time.  Patient reports persistent fatigue loss of  appetite as well as weight loss.  No discernible malignancy so far.  Her CT abdomen and pelvis was negative for malignancy and bone marrow biopsy also did not reveal any evidence of myeloproliferative process or lymphoma or leukemia.  She did get a repeat CT chest on 01/30/2018 2 days after her appointment which I reviewed today and again that does not show any evidence of malignancy.  Recommend follow-up with PCP to evaluate her ongoing symptoms of fatigue.  Consider ruling out endocrinopathies such  as adrenal disorders.   Patient continues to complain of severe fatigue.  She states she can barely even walk from her car into the building at times due to how weak and tired she feels.  She has no stamina.  She has very little energy.  Continues to smoke.  She denies any snoring or witnessed apneic events.  She does not believe she has sleep apnea.  This occurred suddenly earlier this spring after a camping trip.  She denies any rash consistent with Lyme disease however she states that it is possible.  She wants to be checked for vitamin B12 deficiency as well as celiac disease.  She does have severe diarrhea.  She is also reporting location.  She states that her hair falls out throughout her scalp and no specific area more than it ever has in the past.  There is no visible patches of hair loss but there is a generalized thinning of her hair.  She had a catheterization performed in January that showed noncritical coronary artery disease and ejection fraction of 65%.  She had a CT angiogram of the chest, as well as a CT of the abdomen and pelvis that revealed no specific cause that would explain her fatigue.  At that time, my plan was: I spent more than 25 minutes today with the patient discussing her work-up today, reviewing her medical records, and discussing possible causes of her fatigue.  I recommended a sleep study given the severity of her fatigue and her polycythemia.  Given the occurrence after a camping trip, I recommended Lyme titers to evaluate for possible chronic Lyme disease.  I will check a vitamin B12 although this was checked earlier this year and was found to be normal.  I will also check a cortisol level first thing in the morning in addition to an ACTH to determine if there is adrenal insufficiency.  03/14/18 The patient's labs were unremarkable.  She has since met with a sleep specialist and has a sleep study scheduled for December 4.  She continues to experience fatigue.  Her husband  states that she does not sleep well at night.  She is extremely light.  She wakes up frequently.  She never feels rested the following morning.  I do believe if she does not have sleep apnea, insomnia in part is at least contributing to her chronic fatigue she denies any ptosis or muscle weakness although she does report just generally feeling weak and tired all the time.  We have not performed any evaluation for neuromuscular illnesses at this time.  Chronic fatigue is also a possibility albeit of exclusion.  She is here today requesting that I lanced a "boil" under her right arm in the axilla.  On examination, it is approximately a 5 mm small sebaceous cyst that is extremely superficial.  She is also requesting a repeat cortisone injection in her right shoulder.  I last performed a cortisone injection in April.  She saw substantial benefit in her  right arm for approximately 4 to 5 months however over the last month the pain has returned.  She would like to receive this again.  She reports pain with abduction greater than 90 degrees as well as lifting her arm over her head.  She reports pain in her shoulder at night when she tries to sleep.  Past Medical History:  Diagnosis Date  . Adenoma of right adrenal gland    Benign  . Allergy    SEASONAL  . Anemia   . Anxiety   . Arthritis   . Asthma   . Barrett's esophagus 02/09/2014  . CAD (coronary artery disease)    a. prior MI in 2005 with 60-70% dLAD stenosis and 60-70% D2 stenosis (concern for coronary vasospasm)  . Depression   . Dizziness   . Elevated LFTs   . Focal nodular hyperplasia of liver   . GERD (gastroesophageal reflux disease)   . Heart murmur    AS A CHILD  . Hepatic steatosis   . History of adenomatous polyp of colon 02/09/2014  . HLD (hyperlipidemia)   . HTN (hypertension)   . Liver hemangioma   . Migraine   . Migraine headache   . Myocardial infarction ([place])   . NASH (nonalcoholic steatohepatitis)    nash  . Palpitations    . Sinus complaint   . Stroke ([place])   . Unstable angina ([place]) 05/15/2017   Past Surgical History:  Procedure Laterality Date  . ABDOMINOPLASTY    . BLADDER SUSPENSION    . CERVICAL FUSION     C4, 5, 6  . COLONOSCOPY W/ BIOPSIES    . ESOPHAGOGASTRODUODENOSCOPY    . LEFT HEART CATH AND CORONARY ANGIOGRAPHY N/A 05/16/2017   Procedure: LEFT HEART CATH AND CORONARY ANGIOGRAPHY;  Surgeon: Troy Sine, MD;  Location: [facility];  Service: Cardiovascular;  Laterality: N/A;  . TONSILLECTOMY    . TUBAL LIGATION     Current Outpatient Medications on File Prior to Visit  Medication Sig Dispense Refill  . amLODipine (NORVASC) 10 MG tablet Take 10 mg by mouth daily.     Marland Kitchen amLODipine (NORVASC) 10 MG tablet TAKE 5 MG BY MOUTH EVERY DAY 90 tablet 1  . aspirin EC 81 MG tablet Take 81 mg by mouth daily.    . benazepril (LOTENSIN) 20 MG tablet TAKE 1 TABLET BY MOUTH EVERY DAY 90 tablet 2  . butorphanol (STADOL) 10 MG/ML nasal spray INSTILL 1 SPRAY INTO ONE NOSTRIL ONCE DAILY IF NEEDED 2.5 mL 0  . clopidogrel (PLAVIX) 75 MG tablet TAKE 1 TABLET BY MOUTH EVERY DAY 90 tablet 2  . HYDROcodone-acetaminophen (NORCO/VICODIN) 5-325 MG tablet Take 1 tablet by mouth every 6 (six) hours as needed for moderate pain. 30 tablet 0  . ibuprofen (ADVIL,MOTRIN) 200 MG tablet Take 400 mg by mouth 2 (two) times daily as needed for headache or moderate pain.     . metoprolol succinate (TOPROL XL) 25 MG 24 hr tablet Take 0.5 tablets (12.5 mg total) by mouth daily. 45 tablet 3  . nitroGLYCERIN (NITROSTAT) 0.4 MG SL tablet Place 1 tablet (0.4 mg total) under the tongue every 5 (five) minutes as needed for chest pain. 25 tablet 3  . pantoprazole (PROTONIX) 40 MG tablet TAKE 1 TABLET BY MOUTH EVERY DAY BEFORE BREAKFAST 90 tablet 3  . varenicline (CHANTIX CONTINUING MONTH PAK) 1 MG tablet Take 1 tablet (1 mg total) by mouth 2 (two) times daily. 60 tablet 2   No  current facility-administered medications on file prior to  visit.    Allergies  Allergen Reactions  . Aggrenox [Aspirin-Dipyridamole Er] Tinitus  . Atorvastatin Other (See Comments)    Myalgias   . Codeine Itching and Other (See Comments)    Nose itches  . Imitrex [Sumatriptan Base] Swelling and Other (See Comments)    Throat swelling  . Isosorbide Nitrate Other (See Comments)    headaches  . Other Other (See Comments)    Artificial sweeteners cause heart palpitations (stevia is ok)  . Quinolones     Patient was warned about not using Cipro and similar antibiotics. Recent studies have raised concern that fluoroquinolone antibiotics could be associated with an increased risk of aortic aneurysm Fluoroquinolones have non-antimicrobial properties that might jeopardise the integrity of the extracellular matrix of the vascular wall In a  propensity score matched cohort study in [place], there was a 66% increased rate of aortic aneurysm or dissection associated with oral fluoroquinolone use, compared wit  . Rosuvastatin Other (See Comments)    Restless legs  . Simvastatin Other (See Comments)    Myalgias   Social History   Socioeconomic History  . Marital status: Married    Spouse name: Not on file  . Number of children: 1  . Years of education: Not on file  . Highest education level: Not on file  Occupational History  . Occupation: MECHANICAL DESIGNER    Employer: [employer]  Social Needs  . Financial resource strain: Not on file  . Food insecurity:    Worry: Not on file    Inability: Not on file  . Transportation needs:    Medical: Not on file    Non-medical: Not on file  Tobacco Use  . Smoking status: Former Smoker    Packs/day: 0.25    Years: 30.00    Pack years: 7.50    Types: Cigarettes  . Smokeless tobacco: Never Used  . Tobacco comment: restarted 3 weeks ago  Substance and Sexual Activity  . Alcohol use: Yes    Comment: occasional; 1 per day  . Drug use: No  . Sexual activity: Not on file  Lifestyle  . Physical  activity:    Days per week: Not on file    Minutes per session: Not on file  . Stress: Not on file  Relationships  . Social connections:    Talks on phone: Not on file    Gets together: Not on file    Attends religious service: Not on file    Active member of club or organization: Not on file    Attends meetings of clubs or organizations: Not on file    Relationship status: Not on file  . Intimate partner violence:    Fear of current or ex partner: Not on file    Emotionally abused: Not on file    Physically abused: Not on file    Forced sexual activity: Not on file  Other Topics Concern  . Not on file  Social History Narrative   She is married with 1 son   She is a Tax inspector at a factory   Former smoker   Alcohol is used at least once a week glass of wine   Denies drug use      Review of Systems  All other systems reviewed and are negative.      Objective:   Physical Exam  Neck: No thyromegaly present.  Cardiovascular: Normal rate, regular rhythm and normal heart sounds.  Pulmonary/Chest: Effort normal and breath sounds normal. No respiratory distress. She has no wheezes. She has no rales.  Musculoskeletal:       Right shoulder: She exhibits decreased range of motion, tenderness and pain.  Vitals reviewed.  5 mm sebaceous cyst in the right axilla       Assessment & Plan:  Sebaceous cyst of right axilla  Chronic fatigue  Primary insomnia  Subacromial bursitis of right shoulder joint  I believe her chronic fatigue is multifactorial.  I believe some of it is due to smoking.  I believe a large percentage may be due to sleep apnea.  I believe some of it is likely due to chronic insomnia and not receiving a restorative night sleep.  I recommended a 3-week trial of Xanax 0.5 mg p.o. nightly every night to see if the patient's fatigue improves after 3 weeks of good restful night sleep.  Patient is willing to try.  I would then await the results of her sleep  study to see if she has undiagnosed untreated sleep apnea.  If this proves inconclusive, we may want to consider a referral to a neuromuscular specialist for EMG nerve conduction studies and consideration for neuromuscular disorders as causes of her chronic fatigue although her history does not sound consistent with myasthenia gravis.  Regarding the subacromial bursitis in her right shoulder, using sterile technique, I injected the right subacromial space with 2 cc lidocaine, 2 cc of Marcaine, and 2 cc of 40 mg/mL Kenalog.  Patient tolerated the procedure well without complication.

## 2018-03-15 ENCOUNTER — Telehealth: Payer: Self-pay | Admitting: *Deleted

## 2018-03-15 NOTE — Telephone Encounter (Signed)
Received fax requesting alternative to Xanax 0.41m. Reports that medication is on backorder. Requested to change to Xanax 0.228m(2) tabs PO QHS PRN.   MD please advise.

## 2018-03-17 ENCOUNTER — Telehealth: Payer: Self-pay

## 2018-03-17 MED ORDER — ALPRAZOLAM 0.25 MG PO TABS: 0.5000 mg | ORAL_TABLET | Freq: Every evening | ORAL | 0 refills | Status: DC | PRN

## 2018-03-17 NOTE — Telephone Encounter (Signed)
That's fine

## 2018-03-17 NOTE — Telephone Encounter (Signed)
Called pt after receiving my chart message below. Leeanne Rio T "Cathy"  to Pixie Casino, MD       03/17/18 10:55 AM  I would like to make an appointment to discuss my rapid heart beat. My resting pulse is 95 to 112. It makes me very breathless  Adv pt that she should not send mychart messages when notifying us of symptoms, adv pt that it is best for her to call the office and speak with someone. Pt ts that she is still taking her Metoprolol as prescribed. appt scheduled with Almyra Deforest, PA for 11/21 @ 1:30pm. Pt sts that she has been keeping a diary of her HR, adv pt to bring those with her to her appt, pt verbalized understanding.

## 2018-03-17 NOTE — Telephone Encounter (Signed)
Medication pended and awaiting approval.

## 2018-03-18 ENCOUNTER — Inpatient Hospital Stay: Payer: 59

## 2018-03-18 VITALS — BP 121/92 | HR 70

## 2018-03-18 DIAGNOSIS — D751 Secondary polycythemia: Secondary | ICD-10-CM

## 2018-03-18 LAB — CBC
HEMATOCRIT: 52.4 % — AB (ref 36.0–46.0)
HEMOGLOBIN: 16.6 g/dL — AB (ref 12.0–15.0)
MCH: 28.3 pg (ref 26.0–34.0)
MCHC: 31.7 g/dL (ref 30.0–36.0)
MCV: 89.3 fL (ref 80.0–100.0)
NRBC: 0 % (ref 0.0–0.2)
Platelets: 361 10*3/uL (ref 150–400)
RBC: 5.87 MIL/uL — AB (ref 3.87–5.11)
RDW: 13.8 % (ref 11.5–15.5)
WBC: 11.7 10*3/uL — ABNORMAL HIGH (ref 4.0–10.5)

## 2018-03-20 ENCOUNTER — Ambulatory Visit (INDEPENDENT_AMBULATORY_CARE_PROVIDER_SITE_OTHER): Payer: 59 | Admitting: Physician Assistant

## 2018-03-20 ENCOUNTER — Encounter: Payer: Self-pay | Admitting: Physician Assistant

## 2018-03-20 VITALS — BP 121/97 | HR 67 | Resp 16 | Ht 68.0 in | Wt 164.0 lb

## 2018-03-20 DIAGNOSIS — E785 Hyperlipidemia, unspecified: Secondary | ICD-10-CM | POA: Diagnosis not present

## 2018-03-20 DIAGNOSIS — Z8679 Personal history of other diseases of the circulatory system: Secondary | ICD-10-CM

## 2018-03-20 DIAGNOSIS — Z720 Tobacco use: Secondary | ICD-10-CM

## 2018-03-20 DIAGNOSIS — I10 Essential (primary) hypertension: Secondary | ICD-10-CM | POA: Diagnosis not present

## 2018-03-20 DIAGNOSIS — I712 Thoracic aortic aneurysm, without rupture, unspecified: Secondary | ICD-10-CM

## 2018-03-20 DIAGNOSIS — R55 Syncope and collapse: Secondary | ICD-10-CM | POA: Diagnosis not present

## 2018-03-20 DIAGNOSIS — D751 Secondary polycythemia: Secondary | ICD-10-CM

## 2018-03-20 MED ORDER — METOPROLOL SUCCINATE ER 25 MG PO TB24: 12.5000 mg | ORAL_TABLET | Freq: Two times a day (BID) | ORAL | 3 refills | Status: DC

## 2018-03-20 NOTE — Progress Notes (Signed)
Cardiology Office Note    Date:  03/22/2018   ID:  Janet [name], DOB [birth year], MRN [id number]  PCP:  Susy Frizzle, MD  Cardiologist:  Dr. Debara Pickett   Chief Complaint  Patient presents with  . Dizziness    seen for Dr. Debara Pickett.    History of Present Illness:  Janet [name] is a 52 y.o. female with PMH of HTN, HLD, NASH, CVA, CAD, tobacco abuse, secondary polycythemia, thoracic aortic aneurysm followed by CT surgery, and carotid artery stenosis.  She is intolerant to multiple statins.  She has tried Care[employer] adviser in the past.  It was suspected that she likely have LDL receptor mutation.  Genetic testing was not performed.  Praluent was stopped due to elevated LFT and no improvement in HDL.  She also did not respond to Denali as well.  She had a cardiac catheterization in 2005 however no intervention.  More recently, patient was admitted to hospital in January 2019 with unstable angina.  He underwent cardiac catheterization on 05/16/2017 which showed 40 to 50% stenosis in first diagonal, 20% stenosis in mid LAD, medical therapy was recommended.  After the cardiac catheterization patient was seen by Fabian Sharp PA-C in the clinic on 06/04/2017, at which time she still have the chest pain and complaining of generalized fatigue and palpitation.  She wore a heart monitor which showed paroxysmal atrial tachycardia less than 10 beats, variable heart rate.  She was seen by Dr. Debara Pickett in March 2019 and was started on metoprolol succinate 12.5 mg daily.  She was also started on Zetia as well.  She has been seen oncology for secondary polycythemia.  Bone biopsy in August 2019 showed hypercellular bone marrow with no morphologic features diagnostic of myeloproliferative neoplasm.  She has negative Jak 2 mutation which make primary polycythemia vera unlikely.  She is currently receiving weekly phlebotomy to keep the hematocrit level less than 50.  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  She has been seen by Dr. Roxy Horseman of CT surgery.  Patient presents today for cardiology office visit.  She still occasionally notice spiking her heart rate in the 110-120s.  3 weeks ago, she had a episode of extreme heart fast heart rate with severe dizziness and presyncope.  This has not recurred since.  She says she was sitting in the truck at the time.  I think some of her dizziness may still be related to variability in her heart rate.  I will increase her Toprol-XL to 12.5 mg twice daily.  If she does have another presyncope event, I would recommend a heart monitor to rule out significant bradycardia, however her symptoms suggest more of a tachycardia mediated symptom.  Otherwise both her and her husband has been trying to quit smoking.  She is currently on Chantix.  I congratulated her on this.  She has no lower extremity edema, orthopnea or PND.  She denies any recent chest pain.  She can follow-up with Dr. Debara Pickett in 78-month   Past Medical History:  Diagnosis Date  . Adenoma of right adrenal gland    Benign  . Allergy    SEASONAL  . Anemia   . Anxiety   . Arthritis   . Asthma   . Barrett's esophagus 02/09/2014  . CAD (coronary artery disease)  a. prior MI in 2005 with 60-70% dLAD stenosis and 60-70% D2 stenosis (concern for coronary vasospasm)  . Depression   . Dizziness   . Elevated LFTs   . Focal nodular hyperplasia of liver   . GERD (gastroesophageal reflux disease)   . Heart murmur    AS A CHILD  . Hepatic steatosis   . History of adenomatous polyp of colon 02/09/2014  . HLD (hyperlipidemia)   . HTN (hypertension)   . Liver hemangioma   . Migraine   . Migraine headache   . Myocardial infarction ([place])   . NASH (nonalcoholic steatohepatitis)    nash  . Palpitations   . Sinus  complaint   . Stroke ([place])   . Unstable angina ([place]) 05/15/2017    Past Surgical History:  Procedure Laterality Date  . ABDOMINOPLASTY    . BLADDER SUSPENSION    . CERVICAL FUSION     C4, 5, 6  . COLONOSCOPY W/ BIOPSIES    . ESOPHAGOGASTRODUODENOSCOPY    . LEFT HEART CATH AND CORONARY ANGIOGRAPHY N/A 05/16/2017   Procedure: LEFT HEART CATH AND CORONARY ANGIOGRAPHY;  Surgeon: Troy Sine, MD;  Location: [facility];  Service: Cardiovascular;  Laterality: N/A;  . TONSILLECTOMY    . TUBAL LIGATION      Current Medications: Outpatient Medications Prior to Visit  Medication Sig Dispense Refill  . ALPRAZolam (XANAX) 0.25 MG tablet Take 2 tablets (0.5 mg total) by mouth at bedtime as needed for anxiety. 60 tablet 0  . amLODipine (NORVASC) 10 MG tablet Take 10 mg by mouth daily.    Marland Kitchen aspirin EC 81 MG tablet Take 81 mg by mouth daily.    . benazepril (LOTENSIN) 20 MG tablet TAKE 1 TABLET BY MOUTH EVERY DAY 90 tablet 2  . butorphanol (STADOL) 10 MG/ML nasal spray INSTILL 1 SPRAY INTO ONE NOSTRIL ONCE DAILY IF NEEDED 2.5 mL 0  . clopidogrel (PLAVIX) 75 MG tablet TAKE 1 TABLET BY MOUTH EVERY DAY 90 tablet 2  . HYDROcodone-acetaminophen (NORCO/VICODIN) 5-325 MG tablet Take 1 tablet by mouth every 6 (six) hours as needed for moderate pain. 30 tablet 0  . ibuprofen (ADVIL,MOTRIN) 200 MG tablet Take 400 mg by mouth 2 (two) times daily as needed for headache or moderate pain.     . nitroGLYCERIN (NITROSTAT) 0.4 MG SL tablet Place 1 tablet (0.4 mg total) under the tongue every 5 (five) minutes as needed for chest pain. 25 tablet 3  . pantoprazole (PROTONIX) 40 MG tablet TAKE 1 TABLET BY MOUTH EVERY DAY BEFORE BREAKFAST 90 tablet 3  . varenicline (CHANTIX CONTINUING MONTH PAK) 1 MG tablet Take 1 tablet (1 mg total) by mouth 2 (two) times daily. 60 tablet 2  . amLODipine (NORVASC) 10 MG tablet TAKE 5 MG BY MOUTH EVERY DAY (Patient taking differently: Take 10 mg by mouth. ) 90 tablet 1  .  metoprolol succinate (TOPROL XL) 25 MG 24 hr tablet Take 0.5 tablets (12.5 mg total) by mouth daily. 45 tablet 3  . amLODipine (NORVASC) 10 MG tablet Take 10 mg by mouth daily.      No facility-administered medications prior to visit.      Allergies:   Aggrenox [aspirin-dipyridamole er]; Atorvastatin; Codeine; Imitrex [sumatriptan base]; Isosorbide nitrate; Other; Quinolones; Rosuvastatin; and Simvastatin   Social History   Socioeconomic History  . Marital status: Married    Spouse name: Not on file  . Number of children: 1  . Years of education: Not on file  .  Highest education level: Not on file  Occupational History  . Occupation: MECHANICAL DESIGNER    Employer: [employer]  Social Needs  . Financial resource strain: Not on file  . Food insecurity:    Worry: Not on file    Inability: Not on file  . Transportation needs:    Medical: Not on file    Non-medical: Not on file  Tobacco Use  . Smoking status: Former Smoker    Packs/day: 0.25    Years: 30.00    Pack years: 7.50    Types: Cigarettes  . Smokeless tobacco: Never Used  . Tobacco comment: restarted 3 weeks ago  Substance and Sexual Activity  . Alcohol use: Yes    Comment: occasional; 1 per day  . Drug use: No  . Sexual activity: Not on file  Lifestyle  . Physical activity:    Days per week: Not on file    Minutes per session: Not on file  . Stress: Not on file  Relationships  . Social connections:    Talks on phone: Not on file    Gets together: Not on file    Attends religious service: Not on file    Active member of club or organization: Not on file    Attends meetings of clubs or organizations: Not on file    Relationship status: Not on file  Other Topics Concern  . Not on file  Social History Narrative   She is married with 1 son   She is a Tax inspector at a factory   Former smoker   Alcohol is used at least once a week glass of wine   Denies drug use     Family History:  The  patient's family history includes AAA (abdominal aortic aneurysm) in her father and mother; Aneurysm in her father and mother; Brain cancer in her mother; Breast cancer in her paternal aunt; Colon polyps in her mother; Coronary artery disease in her father; Diverticulitis in her mother; Heart disease in her brother, father, and mother; Irritable bowel syndrome in her mother; Liver disease in her maternal uncle; Melanoma in her father; Mesothelioma in her paternal uncle; Other in her mother; Prostate cancer in her paternal uncle; Transient ischemic attack in her father.   ROS:   Please see the history of present illness.    ROS All other systems reviewed and are negative.   PHYSICAL EXAM:   VS:  BP (!) 121/97 (BP Location: Right Arm, Cuff Size: Normal)   Pulse 67   Resp 16   Ht 5' 8"  (1.727 m)   Wt 164 lb (74.4 kg)   SpO2 100%   BMI 24.94 kg/m    GEN: Well nourished, well developed, in no acute distress  HEENT: normal  Neck: no JVD, carotid bruits, or masses Cardiac: RRR; no murmurs, rubs, or gallops,no edema  Respiratory:  clear to auscultation bilaterally, normal work of breathing GI: soft, nontender, nondistended, + BS MS: no deformity or atrophy  Skin: warm and dry, no rash Neuro:  Alert and Oriented x 3, Strength and sensation are intact Psych: euthymic mood, full affect  Wt Readings from Last 3 Encounters:  03/20/18 164 lb (74.4 kg)  03/14/18 164 lb (74.4 kg)  03/04/18 163 lb 1.6 oz (74 kg)      Studies/Labs Reviewed:   EKG:  EKG is ordered today.  The ekg ordered today demonstrates normal sinus rhythm, nonspecific T wave changes  Recent Labs: 06/04/2017: Magnesium 2.3 08/13/2017: TSH 3.64  08/22/2017: BUN 24; Potassium 4.6; Sodium 137 11/19/2017: ALT 90 01/15/2018: Creatinine, Ser 0.70 03/18/2018: Hemoglobin 16.6; Platelets 361   Lipid Panel    Component Value Date/Time   CHOL 317 (H) 11/19/2017 1138   TRIG 168 (H) 11/19/2017 1138   HDL 81 11/19/2017 1138   CHOLHDL  3.9 11/19/2017 1138   CHOLHDL 3.4 02/09/2016 0937   VLDL 31 (H) 02/09/2016 0937   LDLCALC 202 (H) 11/19/2017 1138   LDLDIRECT 173.6 12/17/2011 0951    Additional studies/ records that were reviewed today include:   Echo 05/23/2016 LV EF: 60% -   65% Study Conclusions  - Left ventricle: The cavity size was normal. Wall thickness was   normal. Systolic function was normal. The estimated ejection   fraction was in the range of 60% to 65%. Doppler parameters are   consistent with abnormal left ventricular relaxation (grade 1   diastolic dysfunction). - Aorta: Borderline aortic root. Aortic root dimension: 37 mm (ED). - Right ventricle: The cavity size was normal. Systolic function   was normal. - Atrial septum: No defect or patent foramen ovale was identified.   Echo contrast study showed no right-to-left atrial level shunt,   at baseline or with provocation. - Tricuspid valve: Peak RV-RA gradient (S): 21 mm Hg. - Pulmonary arteries: PA peak pressure: 24 mm Hg (S). - Inferior vena cava: The vessel was normal in size. The   respirophasic diameter changes were in the normal range (>= 50%),   consistent with normal central venous pressure.  Impressions:  - Limited echo for bubble study.    ASSESSMENT:    1. Pre-syncope   2. Tobacco abuse   3. Essential hypertension   4. Hyperlipidemia, unspecified hyperlipidemia type   5. Secondary polycythemia   6. Thoracic aortic aneurysm without rupture ([place])   7. History of carotid artery dissection      PLAN:  In order of problems listed above:  1. Presyncope: Likely tachycardia mediated, previous heart monitor demonstrated paroxysmal atrial tachycardia.  I recommended increasing her Toprol-XL to 12.5 mg twice daily for better rate control.  If she does have recurrence of symptoms, may need to consider a 30-day event monitor  2. Tobacco abuse: Both her and her husband are trying to quit smoking at the same time.  I congratulated  her on her efforts  3. Hypertension: will increase Toprol-XL to 12.5 mg twice daily  4. Hyperlipidemia: History of myalgia with Lipitor, Crestor, and simvastatin.  5. Secondary polycythemia: Likely related to long standing history of smoking  6. Thoracic aortic aneurysm: Followed by cardiothoracic surgery  7. Carotid artery dissection: Stable on the last CT of the chest.   Medication Adjustments/Labs and Tests Ordered: Current medicines are reviewed at length with the patient today.  Concerns regarding medicines are outlined above.  Medication changes, Labs and Tests ordered today are listed in the Patient Instructions below. Patient Instructions  Medication Instructions:  INCREASE Metoprolol to half a tablet twice a day If you need a refill on your cardiac medications before your next appointment, please call your pharmacy.   Lab work: None  If you have labs (blood work) drawn today and your tests are completely normal, you will receive your results only by: Marland Kitchen MyChart Message (if you have MyChart) OR . A paper copy in the mail If you have any lab test that is abnormal or we need to change your treatment, we will call you to review the results.  Testing/Procedures: None  Follow-Up: At  [facility], you and your health needs are our priority.  As part of our continuing mission to provide you with exceptional heart care, we have created designated Provider Care Teams.  These Care Teams include your primary Cardiologist (physician) and Advanced Practice Providers (APPs -  Physician Assistants and Nurse Practitioners) who all work together to provide you with the care you need, when you need it. . Your physician recommends that you schedule a follow-up appointment in: 2 months with Dr Debara Pickett.  Any Other Special Instructions Will Be Listed Below (If Applicable). If you continue to have severe dizziness please contact the office we may need to get another monitor.     Hilbert Corrigan, [place]  03/22/2018 11:35 PM    [facility] [address], [place], [place]  [id number] Phone: [phone]; Fax: [phone]

## 2018-03-20 NOTE — Patient Instructions (Addendum)
Medication Instructions:  INCREASE Metoprolol to half a tablet twice a day If you need a refill on your cardiac medications before your next appointment, please call your pharmacy.   Lab work: None  If you have labs (blood work) drawn today and your tests are completely normal, you will receive your results only by: Marland Kitchen MyChart Message (if you have MyChart) OR . A paper copy in the mail If you have any lab test that is abnormal or we need to change your treatment, we will call you to review the results.  Testing/Procedures: None  Follow-Up: At [facility], you and your health needs are our priority.  As part of our continuing mission to provide you with exceptional heart care, we have created designated Provider Care Teams.  These Care Teams include your primary Cardiologist (physician) and Advanced Practice Providers (APPs -  Physician Assistants and Nurse Practitioners) who all work together to provide you with the care you need, when you need it. . Your physician recommends that you schedule a follow-up appointment in: 2 months with Dr Debara Pickett.  Any Other Special Instructions Will Be Listed Below (If Applicable). If you continue to have severe dizziness please contact the office we may need to get another monitor.

## 2018-03-22 ENCOUNTER — Encounter: Payer: Self-pay | Admitting: Physician Assistant

## 2018-04-01 ENCOUNTER — Other Ambulatory Visit: Payer: Self-pay

## 2018-04-01 ENCOUNTER — Inpatient Hospital Stay: Payer: 59 | Attending: Oncology

## 2018-04-01 ENCOUNTER — Other Ambulatory Visit: Payer: Self-pay | Admitting: Oncology

## 2018-04-01 ENCOUNTER — Inpatient Hospital Stay: Payer: 59

## 2018-04-01 DIAGNOSIS — Z87891 Personal history of nicotine dependence: Secondary | ICD-10-CM | POA: Insufficient documentation

## 2018-04-01 DIAGNOSIS — D751 Secondary polycythemia: Secondary | ICD-10-CM | POA: Insufficient documentation

## 2018-04-01 DIAGNOSIS — Z7982 Long term (current) use of aspirin: Secondary | ICD-10-CM | POA: Insufficient documentation

## 2018-04-01 DIAGNOSIS — Z79899 Other long term (current) drug therapy: Secondary | ICD-10-CM | POA: Insufficient documentation

## 2018-04-01 DIAGNOSIS — I10 Essential (primary) hypertension: Secondary | ICD-10-CM | POA: Insufficient documentation

## 2018-04-01 LAB — CBC
HCT: 48.1 % — ABNORMAL HIGH (ref 36.0–46.0)
Hemoglobin: 15.4 g/dL — ABNORMAL HIGH (ref 12.0–15.0)
MCH: 28.6 pg (ref 26.0–34.0)
MCHC: 32 g/dL (ref 30.0–36.0)
MCV: 89.2 fL (ref 80.0–100.0)
NRBC: 0 % (ref 0.0–0.2)
PLATELETS: 404 10*3/uL — AB (ref 150–400)
RBC: 5.39 MIL/uL — AB (ref 3.87–5.11)
RDW: 14.8 % (ref 11.5–15.5)
WBC: 12.5 10*3/uL — ABNORMAL HIGH (ref 4.0–10.5)

## 2018-04-01 NOTE — Progress Notes (Signed)
Per Dr. Janese Banks no phlebotomy is needed today.  Her HCT is 48.1 and are within parameters. Mrs. Janet [name] is aware and sent home. She had no complaints today.

## 2018-04-02 ENCOUNTER — Ambulatory Visit (INDEPENDENT_AMBULATORY_CARE_PROVIDER_SITE_OTHER): Payer: 59 | Admitting: Neurology

## 2018-04-02 DIAGNOSIS — D751 Secondary polycythemia: Secondary | ICD-10-CM

## 2018-04-02 DIAGNOSIS — G4734 Idiopathic sleep related nonobstructive alveolar hypoventilation: Principal | ICD-10-CM

## 2018-04-02 DIAGNOSIS — J449 Chronic obstructive pulmonary disease, unspecified: Secondary | ICD-10-CM

## 2018-04-02 DIAGNOSIS — G4733 Obstructive sleep apnea (adult) (pediatric): Secondary | ICD-10-CM | POA: Diagnosis not present

## 2018-04-02 DIAGNOSIS — Z720 Tobacco use: Secondary | ICD-10-CM

## 2018-04-03 ENCOUNTER — Institutional Professional Consult (permissible substitution): Payer: 59 | Admitting: Neurology

## 2018-04-09 ENCOUNTER — Telehealth: Payer: Self-pay | Admitting: Neurology

## 2018-04-09 ENCOUNTER — Encounter: Payer: Self-pay | Admitting: Neurology

## 2018-04-09 DIAGNOSIS — I10 Essential (primary) hypertension: Secondary | ICD-10-CM

## 2018-04-09 DIAGNOSIS — G4733 Obstructive sleep apnea (adult) (pediatric): Secondary | ICD-10-CM

## 2018-04-09 DIAGNOSIS — G43009 Migraine without aura, not intractable, without status migrainosus: Secondary | ICD-10-CM

## 2018-04-09 DIAGNOSIS — Z8673 Personal history of transient ischemic attack (TIA), and cerebral infarction without residual deficits: Secondary | ICD-10-CM

## 2018-04-09 NOTE — Telephone Encounter (Signed)
-----   Message from Larey Seat, MD sent at 04/09/2018 12:48 PM EST ----- Patient has severe hypoxemia during sleep associated with moderate, REM sleep accentuated, obstructive Sleep Apnea.  RECOMMENDATION: CPAP titration with possible Oxygen  supplementation is necessary.  The patient will need to return for an attended sleep study. (If  not possible , will order auto CPAP with ONO to follow while on  CPAP).

## 2018-04-09 NOTE — Telephone Encounter (Signed)
Called patient to discuss sleep study results. No answer at this time. LVM for the patient to call back.   

## 2018-04-09 NOTE — Procedures (Deleted)
NAME:  Flint Melter. Ball                                                                              DOB: 11/12/1954 MEDICAL RECORD No:   [id number]                                                DOS:  04/07/2018 REFERRING PHYSICIAN: Scarlette Calico, MD STUDY PERFORMED: Home Sleep Test on Watch Pat HISTORY: Verlon Au is a 52 y.o. female patient, seen here on 03-10-2018 in a referral from Dr. Ronnald Ramp for a sleep consultation based on a finding of polycythemia, in a smoker. The patient reports that she does not have a problem falling asleep initially, but to maintain sleep has been difficult.  Once awake, she struggles to get more sleep, and often stays awake. The patient stated that since age 83 she has continuously and steadily gained weight, she began snoring, but she is not sure if the insomnia correlates to the same timeframe or not. There was no sleep problem when she was in her 20s or 30s.  She feels very much impaired by her sleep deprivation during daytime function.  When she saw Dr. Ronnald Ramp she had just had a UTI- and did not tolerate the Bactrim.  She had reported a 27-monthhistory of intermittent headaches which meanwhile continues to bother her. Epworth 19/24, FSS at 60/63 points, BMI 23.4 kg/m2 with unintentional weight loss.   STUDY RESULTS:  Total Recording Time: 7 h 9 min, Valid Sleep Time:  6 h 12 min.  REM proportion of Sleep Time: 17.0% Total Apnea/Hypopnea Index (AHI):  25.1 /h; RDI:  26.8/h; REM AHI 36.9/h. Average Oxygen Saturation:   95 %; Lowest Oxygen Desaturation: 87 %  Total Time in Oxygen Saturation below 89 %:  0.3 minutes  Average Heart Rate: 69 bpm, regular (between 40 and 99 bpm). IMPRESSION: Moderate Severe Obstructive Sleep apnea with AHI of 25.1/h. NO clinically significant oxygen desaturation, but snoring was noted. Heart rate variability with sinus bradycardia.  RECOMMENDATION:  REM accentuated/ dependent sleep apnea will require CPAP therapy, autotitration will be ordered,  heated humidity, pressure between 5-15 cm water, 1 cm EPR.  I certify that I have reviewed the raw data recording prior to the issuance of this report in accordance with the standards of the American Academy of Sleep Medicine (AASM). CLarey Seat M.D.   04-09-2018    Medical Director of [facility] accredited by the AASM. Diplomat of the ABPN and ABSM.

## 2018-04-09 NOTE — Addendum Note (Signed)
Addended by: Larey Seat on: 04/09/2018 12:48 PM   Modules accepted: Orders

## 2018-04-09 NOTE — Procedures (Signed)
Janet [name], [place] [address] NAME:  [name]                                                      DOB: 02-06-1966 MEDICAL RECORD no:   [id number]                                     DOS:  04/03/2018 REFERRING PHYSICIAN: Jenna Luo, MD STUDY PERFORMED: Home Sleep Test on Watch Pat  HISTORY: Janet [name] is a 52 y.o. female patient, seen here on 02-18-2018 upon referral from Dr. Dennard Schaumann for evaluation of polycythemia, and possible sleep apnea or sleep hypoxemia.  [name] was diagnosed with polycythemia in April of this year, at the time she had successfully quit smoking but soon after relapsed.  She is currently smoking half pack per day. She feels very fatigued but reports she sleeps well, "hard".  She is losing hair and reports unintentional weight loss.   Epworth score endorsed at 0/ 24, Fatigue severity score at 63/ 63 points. BMI 23.4 kg/m2   STUDY RESULTS:  Total Recording Time: 10 h 38 min; Valid Sleep Time: 9 h 34 min. REM proportion of total Sleep Time: 35.5% Total Apnea/Hypopnea Index (AHI):  21.7 /h; RDI: 21.9 /h, REM AHI 36.9-  Average Oxygen Saturation:  90 %: Lowest Oxygen Desaturation:  85 %  Total Time in Oxygen Saturation below 89 %: 101.5 minutes  Average Heart Rate: 77 bpm (between 63 and 113 bpm) IMPRESSION: Moderate Severe Obstructive Sleep Apnea with severe and prolonged Desaturation/ Hypoxemia of sleep. REM sleep exacerbated AHI.  RECOMMENDATION: CPAP titration with possible Oxygen supplementation is necessary.  The patient will need to return for an attended sleep study. (If not possible , will order auto CPAP with ONO to follow while on CPAP).  I certify that I have reviewed the raw data recording prior to the issuance of this report in accordance with the standards of the American Academy of Sleep Medicine (AASM). Larey Seat, M.D.  04-09-2018      Medical Director of [facility] at [facility], accredited by the AASM. Diplomat of the  ABPN and ABSM.

## 2018-04-10 ENCOUNTER — Telehealth: Payer: Self-pay

## 2018-04-10 ENCOUNTER — Encounter: Payer: Self-pay | Admitting: Family Medicine

## 2018-04-10 DIAGNOSIS — G4733 Obstructive sleep apnea (adult) (pediatric): Secondary | ICD-10-CM | POA: Insufficient documentation

## 2018-04-10 NOTE — Addendum Note (Signed)
Addended by: Darleen Crocker on: 04/10/2018 11:29 AM   Modules accepted: Orders

## 2018-04-10 NOTE — Telephone Encounter (Signed)
[employer] will deny cpap titration due to no centrals on HST. She will need an auto unit with overnight oximetry.

## 2018-04-10 NOTE — Telephone Encounter (Signed)
I called pt. I advised pt that Dr. Brett Fairy reviewed their sleep study results and found that pt sleep apnea. Dr. Brett Fairy recommends that pt auto CPAP. I reviewed PAP compliance expectations with the pt. Pt is agreeable to starting a CPAP. I advised pt that an order will be sent to a DME, [facility], and [facility] will call the pt within about one week after they file with the pt's insurance. [facility] will show the pt how to use the machine, fit for masks, and troubleshoot the CPAP if needed. A follow up appt was made for insurance purposes with Cecille Rubin, NP on Mar 3,2020 at 1:15 pm . Pt verbalized understanding to arrive 15 minutes early and bring their CPAP. A letter with all of this information in it will be mailed to the pt as a reminder. I verified with the pt that the address we have on file is correct. Pt verbalized understanding of results. Pt had no questions at this time but was encouraged to call back if questions arise. I have sent the order to [facility] and have received confirmation that they have received the order.

## 2018-04-14 NOTE — Progress Notes (Signed)
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 She has been referred to [place] for evaluation of polycythemia.35 patient has had a chronically elevated hemoglobin and back in 2015 her H&H was 16.2/48.1. Most recently in January 2019 her H&H was 17.1/49.2 then 18.4/53.7 in April 2019. Her main complaint today is excessive fatigue. She reports a slight lacy rash in the medial aspect of her left knee. She denies any blood in her urine. Reports that her appetite is good and she denies any unintentional weight loss. She does not do any anabolic steroids. She reports getting a good night sleep and wakes up refreshed in the morning. She does not remember being told that she snores at night. She does not have any known chronic lung disease.  Results of blood work from 08/22/2017 were as follows: CBC showed white count of 11.5, H&H of 17.9/52.5 with an MCV of 89.6 and a platelet count of 293. E Po level was low at 1.5. Jak 2, CALR and MPL mutation testing was negative. Exon 12 testing was also negative. Urinalysis did not reveal any hematuria. CMP showed elevated AST and ALT of 63 and 107 respectively. carboxyhb levels were elevated at 9  When bone marrow biopsy on 12/13/2017 showed hypercellular bone marrow with trilineage hematopoiesis. There were no morphologic features diagnostic of myeloproliferative neoplasm seen.  Cytogenetic studies were normal  Ct chest/ abdomen did not reveal any malignancy.    Interval history-reports that her appetite is improving and her diarrhea has resolved.  She still feels fatigued.  Denies other complaints  ECOG PS- 1 Pain scale- 0 Opioid associated constipation- no  Review of systems- Review of Systems  Constitutional: Positive for malaise/fatigue. Negative for chills, fever and weight loss.  HENT: Negative for congestion, ear discharge and nosebleeds.   Eyes: Negative for blurred vision.  Respiratory: Negative for cough, hemoptysis, sputum production, shortness of breath and wheezing.   Cardiovascular: Negative for chest pain, palpitations, orthopnea and claudication.  Gastrointestinal: Negative for abdominal pain, blood in stool, constipation, diarrhea, heartburn, melena, nausea and vomiting.  Genitourinary: Negative for dysuria, flank pain, frequency, hematuria and urgency.  Musculoskeletal: Negative for back pain, joint pain and myalgias.  Skin: Negative for rash.  Neurological: Negative for dizziness, tingling, focal weakness, seizures, weakness and headaches.  Endo/Heme/Allergies: Does not bruise/bleed easily.  Psychiatric/Behavioral: Negative for depression and suicidal ideas. The patient does not have insomnia.      Allergies  Allergen Reactions  . Aggrenox [Aspirin-Dipyridamole Er] Tinitus  . Atorvastatin Other (See Comments)    Myalgias   . Codeine Itching and Other (See Comments)    Nose itches  . Imitrex [Sumatriptan Base] Swelling and Other (See Comments)    Throat swelling  . Isosorbide Nitrate Other (See Comments)    headaches  . Other Other (See Comments)    Artificial sweeteners cause heart palpitations (stevia is ok)  . Quinolones  Patient was warned about not using Cipro and similar antibiotics. Recent studies have raised concern that fluoroquinolone antibiotics could be associated with an increased risk of aortic  aneurysm Fluoroquinolones have non-antimicrobial properties that might jeopardise the integrity of the extracellular matrix of the vascular wall In a  propensity score matched cohort study in [place], there was a 66% increased rate of aortic aneurysm or dissection associated with oral fluoroquinolone use, compared wit  . Rosuvastatin Other (See Comments)    Restless legs  . Simvastatin Other (See Comments)    Myalgias     Past Medical History:  Diagnosis Date  . Adenoma of right adrenal gland    Benign  . Allergy    SEASONAL  . Anemia   . Anxiety   . Arthritis   . Asthma   . Barrett's esophagus 02/09/2014  . CAD (coronary artery disease)    a. prior MI in 2005 with 60-70% dLAD stenosis and 60-70% D2 stenosis (concern for coronary vasospasm)  . Depression   . Focal nodular hyperplasia of liver   . GERD (gastroesophageal reflux disease)   . Heart murmur    AS A CHILD  . History of adenomatous polyp of colon 02/09/2014  . HLD (hyperlipidemia)   . HTN (hypertension)   . Liver hemangioma   . Migraine headache   . Myocardial infarction ([place])   . NASH (nonalcoholic steatohepatitis)    nash  . OSA (obstructive sleep apnea)    ahi 21.7  . Palpitations   . Sinus complaint   . Stroke ([place])   . Unstable angina ([place]) 05/15/2017     Past Surgical History:  Procedure Laterality Date  . ABDOMINOPLASTY    . BLADDER SUSPENSION    . CERVICAL FUSION     C4, 5, 6  . COLONOSCOPY W/ BIOPSIES    . ESOPHAGOGASTRODUODENOSCOPY    . LEFT HEART CATH AND CORONARY ANGIOGRAPHY N/A 05/16/2017   Procedure: LEFT HEART CATH AND CORONARY ANGIOGRAPHY;  Surgeon: Troy Sine, MD;  Location: [place] [facility];  Service: Cardiovascular;  Laterality: N/A;  . TONSILLECTOMY    . TUBAL LIGATION      Social History   Socioeconomic History  . Marital status: Married    Spouse name: Not on file  . Number of children: 1  . Years of education: Not on file  . Highest education level: Not on file   Occupational History  . Occupation: MECHANICAL DESIGNER    Employer: [employer]  Social Needs  . Financial resource strain: Not on file  . Food insecurity:    Worry: Not on file    Inability: Not on file  . Transportation needs:    Medical: Not on file    Non-medical: Not on file  Tobacco Use  . Smoking status: Former Smoker    Packs/day: 0.25    Years: 30.00    Pack years: 7.50    Types: Cigarettes  . Smokeless tobacco: Never Used  . Tobacco comment: restarted 3 weeks ago  Substance and Sexual Activity  . Alcohol use: Yes    Comment: occasional; 1 per day  . Drug use: No  . Sexual activity: Not on file  Lifestyle  . Physical activity:    Days per week: Not on file    Minutes per session: Not on file  . Stress: Not on file  Relationships  . Social connections:    Talks on phone: Not on file    Gets together: Not on file  Attends religious service: Not on file    Active member of club or organization: Not on file    Attends meetings of clubs or organizations: Not on file    Relationship status: Not on file  . Intimate partner violence:    Fear of current or ex partner: Not on file    Emotionally abused: Not on file    Physically abused: Not on file    Forced sexual activity: Not on file  Other Topics Concern  . Not on file  Social History Narrative   She is married with 1 son   She is a Tax inspector at a factory   Former smoker   Alcohol is used at least once a week glass of wine   Denies drug use    Family History  Problem Relation Age of Onset  . Coronary artery disease Father   . Aneurysm Father        AAA  . Melanoma Father   . Transient ischemic attack Father   . Heart disease Father   . AAA (abdominal aortic aneurysm) Father   . Aneurysm Mother        AAA  . Colon polyps Mother   . Heart disease Mother   . Irritable bowel syndrome Mother   . Diverticulitis Mother   . Other Mother        Evlyn Clines  . AAA (abdominal aortic  aneurysm) Mother   . Brain cancer Mother   . Breast cancer Paternal Aunt   . Liver disease Maternal Uncle   . Heart disease Brother   . Prostate cancer Paternal Uncle   . Mesothelioma Paternal Uncle   . Colon cancer Neg Hx      Current Outpatient Medications:  .  ALPRAZolam (XANAX) 0.25 MG tablet, Take 2 tablets (0.5 mg total) by mouth at bedtime as needed for anxiety., Disp: 60 tablet, Rfl: 0 .  amLODipine (NORVASC) 10 MG tablet, Take 10 mg by mouth daily., Disp: , Rfl:  .  aspirin EC 81 MG tablet, Take 81 mg by mouth daily., Disp: , Rfl:  .  benazepril (LOTENSIN) 20 MG tablet, TAKE 1 TABLET BY MOUTH EVERY DAY, Disp: 90 tablet, Rfl: 2 .  butorphanol (STADOL) 10 MG/ML nasal spray, INSTILL 1 SPRAY INTO ONE NOSTRIL ONCE DAILY IF NEEDED, Disp: 2.5 mL, Rfl: 0 .  clopidogrel (PLAVIX) 75 MG tablet, TAKE 1 TABLET BY MOUTH EVERY DAY, Disp: 90 tablet, Rfl: 2 .  HYDROcodone-acetaminophen (NORCO/VICODIN) 5-325 MG tablet, Take 1 tablet by mouth every 6 (six) hours as needed for moderate pain., Disp: 30 tablet, Rfl: 0 .  ibuprofen (ADVIL,MOTRIN) 200 MG tablet, Take 400 mg by mouth 2 (two) times daily as needed for headache or moderate pain. , Disp: , Rfl:  .  metoprolol succinate (TOPROL XL) 25 MG 24 hr tablet, Take 0.5 tablets (12.5 mg total) by mouth 2 (two) times daily., Disp: 30 tablet, Rfl: 3 .  nitroGLYCERIN (NITROSTAT) 0.4 MG SL tablet, Place 1 tablet (0.4 mg total) under the tongue every 5 (five) minutes as needed for chest pain., Disp: 25 tablet, Rfl: 3 .  pantoprazole (PROTONIX) 40 MG tablet, TAKE 1 TABLET BY MOUTH EVERY DAY BEFORE BREAKFAST, Disp: 90 tablet, Rfl: 3 .  varenicline (CHANTIX CONTINUING MONTH PAK) 1 MG tablet, Take 1 tablet (1 mg total) by mouth 2 (two) times daily., Disp: 60 tablet, Rfl: 2  Physical exam:  Vitals:   04/15/18 1326  BP: (!) 149/86  Pulse: 78  Resp:  18  Temp: (!) 97.1 F (36.2 C)  TempSrc: Tympanic  Weight: 164 lb 14.4 oz (74.8 kg)   Physical Exam HENT:       Head: Normocephalic and atraumatic.  Eyes:     Pupils: Pupils are equal, round, and reactive to light.  Neck:     Musculoskeletal: Normal range of motion.  Cardiovascular:     Rate and Rhythm: Normal rate and regular rhythm.     Heart sounds: Normal heart sounds.  Pulmonary:     Effort: Pulmonary effort is normal.     Breath sounds: Normal breath sounds.  Abdominal:     General: Bowel sounds are normal.     Palpations: Abdomen is soft.  Skin:    General: Skin is warm and dry.  Neurological:     Mental Status: She is alert and oriented to person, place, and time.      CMP Latest Ref Rng & Units 01/15/2018  Glucose 65 - 99 mg/dL -  BUN 6 - 20 mg/dL -  Creatinine 0.44 - 1.00 mg/dL 0.70  Sodium 135 - 145 mmol/L -  Potassium 3.5 - 5.1 mmol/L -  Chloride 101 - 111 mmol/L -  CO2 22 - 32 mmol/L -  Calcium 8.9 - 10.3 mg/dL -  Total Protein 6.0 - 8.5 g/dL -  Total Bilirubin 0.0 - 1.2 mg/dL -  Alkaline Phos 39 - 117 IU/L -  AST 0 - 40 IU/L -  ALT 0 - 32 IU/L -   CBC Latest Ref Rng & Units 04/01/2018  WBC 4.0 - 10.5 K/uL 12.5(H)  Hemoglobin 12.0 - 15.0 g/dL 15.4(H)  Hematocrit 36.0 - 46.0 % 48.1(H)  Platelets 150 - 400 K/uL 404(H)      Assessment and plan- Patient is a 52 y.o. female with secondary polycythemia possibly secondary to sleep smoking/possible obstructive sleep apnea she is here for routine follow-up of her polycythemia  Patient has had extensive work-up for primary polycythemia which was negative.  Her polycythemia is likely secondary from her smoking and/or obstructive sleep apnea.  She does not feel significantly better in terms of her fatigue when she gets a phlebotomy done at a hematocrit of 50.  I will therefore feel comfortable keeping her goal of phlebotomy at hematocrit of 55  Repeat CBC in 1 month 2 months and 3 months and I will see her back in 3 months.  She will get 500 cc of blood taken out for 1 blood if her hematocrit is greater than 55 at that  time    Visit Diagnosis 1. Polycythemia, secondary      Dr. Randa Evens, MD, MPH [facility] [phone] 04/15/2018 1:49 PM

## 2018-04-15 ENCOUNTER — Inpatient Hospital Stay (HOSPITAL_BASED_OUTPATIENT_CLINIC_OR_DEPARTMENT_OTHER): Payer: 59 | Admitting: Oncology

## 2018-04-15 ENCOUNTER — Other Ambulatory Visit: Payer: Self-pay

## 2018-04-15 ENCOUNTER — Inpatient Hospital Stay: Payer: 59

## 2018-04-15 ENCOUNTER — Encounter: Payer: Self-pay | Admitting: Oncology

## 2018-04-15 VITALS — BP 149/86 | HR 78 | Temp 97.1°F | Resp 18 | Wt 164.9 lb

## 2018-04-15 DIAGNOSIS — Z79899 Other long term (current) drug therapy: Secondary | ICD-10-CM

## 2018-04-15 DIAGNOSIS — D751 Secondary polycythemia: Secondary | ICD-10-CM | POA: Diagnosis not present

## 2018-04-15 DIAGNOSIS — Z7982 Long term (current) use of aspirin: Secondary | ICD-10-CM

## 2018-04-15 DIAGNOSIS — I10 Essential (primary) hypertension: Secondary | ICD-10-CM

## 2018-04-15 DIAGNOSIS — Z87891 Personal history of nicotine dependence: Secondary | ICD-10-CM | POA: Diagnosis not present

## 2018-04-15 LAB — CBC
HCT: 50.1 % — ABNORMAL HIGH (ref 36.0–46.0)
HEMOGLOBIN: 15.7 g/dL — AB (ref 12.0–15.0)
MCH: 27.7 pg (ref 26.0–34.0)
MCHC: 31.3 g/dL (ref 30.0–36.0)
MCV: 88.4 fL (ref 80.0–100.0)
NRBC: 0 % (ref 0.0–0.2)
Platelets: 339 10*3/uL (ref 150–400)
RBC: 5.67 MIL/uL — ABNORMAL HIGH (ref 3.87–5.11)
RDW: 15.4 % (ref 11.5–15.5)
WBC: 11.5 10*3/uL — AB (ref 4.0–10.5)

## 2018-04-15 NOTE — Progress Notes (Signed)
Here for follow up . Stated overall " not too bad- I still have no energy -but better than last month.  Off and on SOB " dx w sleep apnea- going for c pap fitting tomorrow  Pulse ox 97% on RA. Pt having tachycardia /episodic

## 2018-04-16 DIAGNOSIS — G4733 Obstructive sleep apnea (adult) (pediatric): Secondary | ICD-10-CM | POA: Diagnosis not present

## 2018-05-16 ENCOUNTER — Inpatient Hospital Stay: Payer: 59 | Attending: Oncology

## 2018-05-16 ENCOUNTER — Inpatient Hospital Stay: Payer: 59

## 2018-05-16 DIAGNOSIS — Z87891 Personal history of nicotine dependence: Secondary | ICD-10-CM | POA: Insufficient documentation

## 2018-05-16 DIAGNOSIS — I10 Essential (primary) hypertension: Secondary | ICD-10-CM | POA: Insufficient documentation

## 2018-05-16 DIAGNOSIS — Z7982 Long term (current) use of aspirin: Secondary | ICD-10-CM | POA: Diagnosis not present

## 2018-05-16 DIAGNOSIS — D751 Secondary polycythemia: Secondary | ICD-10-CM

## 2018-05-16 DIAGNOSIS — Z79899 Other long term (current) drug therapy: Secondary | ICD-10-CM | POA: Diagnosis not present

## 2018-05-16 LAB — CBC
HEMATOCRIT: 50.2 % — AB (ref 36.0–46.0)
Hemoglobin: 16 g/dL — ABNORMAL HIGH (ref 12.0–15.0)
MCH: 27.6 pg (ref 26.0–34.0)
MCHC: 31.9 g/dL (ref 30.0–36.0)
MCV: 86.6 fL (ref 80.0–100.0)
NRBC: 0 % (ref 0.0–0.2)
PLATELETS: 346 10*3/uL (ref 150–400)
RBC: 5.8 MIL/uL — AB (ref 3.87–5.11)
RDW: 17.1 % — ABNORMAL HIGH (ref 11.5–15.5)
WBC: 10.9 10*3/uL — ABNORMAL HIGH (ref 4.0–10.5)

## 2018-05-17 DIAGNOSIS — G4733 Obstructive sleep apnea (adult) (pediatric): Secondary | ICD-10-CM | POA: Diagnosis not present

## 2018-05-18 ENCOUNTER — Other Ambulatory Visit: Payer: Self-pay | Admitting: Family Medicine

## 2018-05-19 NOTE — Telephone Encounter (Signed)
Ok to refill??  Last office visit 03/14/2018.  Last refill 03/17/2018.

## 2018-05-21 ENCOUNTER — Ambulatory Visit (INDEPENDENT_AMBULATORY_CARE_PROVIDER_SITE_OTHER): Payer: 59 | Admitting: Internal Medicine

## 2018-05-21 VITALS — BP 130/98 | HR 66 | Ht 68.0 in | Wt 170.0 lb

## 2018-05-21 DIAGNOSIS — I10 Essential (primary) hypertension: Secondary | ICD-10-CM

## 2018-05-21 DIAGNOSIS — I471 Supraventricular tachycardia: Secondary | ICD-10-CM

## 2018-05-21 DIAGNOSIS — D751 Secondary polycythemia: Secondary | ICD-10-CM

## 2018-05-21 DIAGNOSIS — E785 Hyperlipidemia, unspecified: Secondary | ICD-10-CM

## 2018-05-21 NOTE — Patient Instructions (Signed)
Medication Instructions:  Continue current medications If you need a refill on your cardiac medications before your next appointment, please call your pharmacy.   Lab work: FASTING lab work to [employer] @ your convenience  If you have labs (blood work) drawn today and your tests are completely normal, you will receive your results only by: Marland Kitchen MyChart Message (if you have MyChart) OR . A paper copy in the mail If you have any lab test that is abnormal or we need to change your treatment, we will call you to review the results.  Follow-Up: At [facility], you and your health needs are our priority.  As part of our continuing mission to provide you with exceptional heart care, we have created designated Provider Care Teams.  These Care Teams include your primary Cardiologist (physician) and Advanced Practice Providers (APPs -  Physician Assistants and Nurse Practitioners) who all work together to provide you with the care you need, when you need it. You will need a follow up appointment in 6 months.  Please call our office 2 months in advance to schedule this appointment.  You may see Pixie Casino, MD or one of the following Advanced Practice Providers on your designated Care Team: [place], [place] . Fabian Sharp, PA-C  Any Other Special Instructions Will Be Listed Below (If Applicable).

## 2018-05-21 NOTE — Progress Notes (Signed)
OFFICE NOTE  Chief Complaint:  Follow=up dyslipidemia  Primary Care Physician: Susy Frizzle, MD  HPI:  Janet [name] is a 53 y.o. female with a history of moderate coronary artery disease and MI in 2005, at which time she was noted to have a 60-70% distal LAD stenosis and a 60-70% ostial D1 stenosis on cath. There is no clear culprit lesion and she did not receive a stent. She was thought to have vasospasm. Since then she's had numerous stress tests without any recurrent ischemia and she denies any recurrent chest pain. She has had ongoing problems with TIA and has been on and off of Plavix. Mostly she notes visual field cuts as well as arm and facial numbness. She has been seen by Dr. Tomi Likens with neurology and had an echocardiogram this past year which showed an EF of 60-65%, mild LVH, mild AI and mild TR. A bubble study was not performed. She's also had bilateral carotid artery ultrasounds which showed moderate to large amount of atherosclerotic plaque on the right greater than left without hemodynamically significant stenosis. 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 She was then referred to Dr. Servando Snare with thoracic surgery. It was also noted that she had multiple subcentimeter pulmonary nodules which require follow-up. There are no plans for surgery at this time. She was placed on a monitor which demonstrated no obvious arrhythmias or atrial fibrillation. She has a history of statin intolerance in the past. She is previously been on Lipitor, Crestor, Zocor, Pravachol and is currently tolerating Livalo, however, LDL-C on 02/09/2016 was 147, which represents poor control given her high carotid artery plaque burden and history of coronary artery disease (ASCVD).   04/18/2017  Janet [name] returns today for follow-up of her dyslipidemia and coronary disease.  Fortunately she has had no further TIA events this year.   In January she underwent echo with bubble study which was negative for PFO.  She also had carotid Dopplers which were normal.  She does have ASCVD and is been statin intolerant in the past.  Given her prior MI and LAD and diagonal stenosis on catheterization, we recommended aggressive medical therapy with a goal LDL less than 70.  She was recently started on Praluent, however her LDL was not improved.  Subsequently it seems that she was taken off the medication due to elevated liver enzymes.  She does have a history of nonalcoholic fatty liver disease.  Her AST and ALT at baseline were 39 and 60, but increased to 125 and 237 on medication.  Praluent was discontinued and her AST return to 61 with ALT 116.  She was asymptomatic with this.  05/15/2017  Janet [name] was seen today as an add-on for acute chest pain.  This was not much of an issue in December when I saw her however over the past 2 weeks she has had some progressive chest discomfort.  She is required nitroglycerin.  She feels as a squeezing pain in the center of her chest which can occur with exertion or at rest.  She said the pain radiates up to her jaw and into her teeth.  She takes nitroglycerin and does have some relief.  This past week she has had to take for 5 nitroglycerin and is concerned about coronary disease.  We repeated her EKG today which demonstrates normal sinus rhythm and inferior and lateral ischemic changes.  This was present in  her EKG in December however may be somewhat worse today.  These changes have been somewhat stable and were considered repolarization abnormalities.  07/25/2017  Janet [name] was recently seen by our advanced practice providers for symptoms of chest pain, but her symptoms so included palpitations.  She wore a monitor which showed some paroxysmal atrial tachycardia.  Short runs less than 10 beats.  It is notable that her heart rate varies.  In February heart rate was in the 80s and in January in the 70s.  Today  her heart rates in the low 50s, but I still think there is room for her to potentially be on a beta-blocker.  In addition we discussed her dyslipidemia.  Unfortunately she has been intolerant to statins and had had no response to both PCSK9 inhibitors.  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  I offered genetic testing however due to cost issues he declined.  She tells me that she has a significant family history of dyslipidemia and her brother recently started on Repatha.  I suspect he may not have improvement either.  We discussed another alternative, namely Lomitapide.  This would have to be used very cautiously given her history of abnormal liver enzymes in the past, but could significantly decrease her cholesterol by an alternative mechanism.  She wishes to discuss this further.  In the meantime I would recommend we try her on ezetimibe.  She does not have a history of trying that before.  It was ordered but apparently was too expensive when it was previously non-generic.  11/19/2017  Janet [name] returns today for follow-up.  Overall she is doing well denies any recurrent palpitations or chest pain.  Heart rates in the 80s today with normal blood pressure.  We have also been seeing her in the lipid clinic for management of dyslipidemia.  She has been intolerant to statins and was a PCSK9 nonresponder.  It was thought that she might have LDL receptor mutation however genetic testing was not performed.  We discussed starting ezetimibe which she has been taking.  She reports daily fatigue but cannot directly attributed to this medicine.  We have not yet reassessed her lipid profile.  05/22/2018  Janet [name] is seen today in follow-up of her dyslipidemia.  Overall she feels better after recently her beta-blocker was increased by Almyra Deforest, PA-C for tachycardia and history of presyncope.  She denies chest pain or worsening shortness of breath.  There is no new edema.  Blood pressure is better controlled.   She still complains of fatigue without clear etiology of those symptoms.  She has not had a recent repeat lipid profile and cholesterol remains above goal.  She had tried ezetimibe but feels that it is not well tolerated.  PMHx:  Past Medical History:  Diagnosis Date  . Adenoma of right adrenal gland    Benign  . Allergy    SEASONAL  . Anemia   . Anxiety   . Arthritis   . Asthma   . Barrett's esophagus 02/09/2014  . CAD (coronary artery disease)    a. prior MI in 2005 with 60-70% dLAD stenosis and 60-70% D2 stenosis (concern for coronary vasospasm)  . Depression   . Focal nodular hyperplasia of liver   . GERD (gastroesophageal reflux disease)   . Heart murmur    AS A CHILD  . History of adenomatous polyp of colon 02/09/2014  . HLD (hyperlipidemia)   . HTN (hypertension)   . Liver hemangioma   .  Migraine headache   . Myocardial infarction ([place])   . NASH (nonalcoholic steatohepatitis)    [place]ash  . OSA (obstructive sleep apnea)    ahi 21.7  . Palpitations   . Sinus complaint   . Stroke ([place])   . Unstable angina ([place]) 05/15/2017    Past Surgical History:  Procedure Laterality Date  . ABDOMINOPLASTY    . BLADDER SUSPENSION    . CERVICAL FUSION     C4, 5, 6  . COLONOSCOPY W/ BIOPSIES    . ESOPHAGOGASTRODUODENOSCOPY    . LEFT HEART CATH AND CORONARY ANGIOGRAPHY N/A 05/16/2017   Procedure: LEFT HEART CATH AND CORONARY ANGIOGRAPHY;  Surgeon: Troy Sine, MD;  Location: [facility];  Service: Cardiovascular;  Laterality: N/A;  . TONSILLECTOMY    . TUBAL LIGATION      FAMHx:  Family History  Problem Relation Age of Onset  . Coronary artery disease Father   . Aneurysm Father        AAA  . Melanoma Father   . Transient ischemic attack Father   . Heart disease Father   . AAA (abdominal aortic aneurysm) Father   . Aneurysm Mother        AAA  . Colon polyps Mother   . Heart disease Mother   . Irritable bowel syndrome Mother   . Diverticulitis Mother   . Other  Mother        Evlyn Clines  . AAA (abdominal aortic aneurysm) Mother   . Brain cancer Mother   . Breast cancer Paternal Aunt   . Liver disease Maternal Uncle   . Heart disease Brother   . Prostate cancer Paternal Uncle   . Mesothelioma Paternal Uncle   . Colon cancer Neg Hx     SOCHx:   reports that she has quit smoking. Her smoking use included cigarettes. She has a 7.50 pack-year smoking history. She has never used smokeless tobacco. She reports current alcohol use. She reports that she does not use drugs.  ALLERGIES:  Allergies  Allergen Reactions  . Aggrenox [Aspirin-Dipyridamole Er] Tinitus  . Atorvastatin Other (See Comments)    Myalgias   . Codeine Itching and Other (See Comments)    Nose itches  . Imitrex [Sumatriptan Base] Swelling and Other (See Comments)    Throat swelling  . Isosorbide Nitrate Other (See Comments)    headaches  . Other Other (See Comments)    Artificial sweeteners cause heart palpitations (stevia is ok)  . Quinolones     Patient was warned about not using Cipro and similar antibiotics. Recent studies have raised concern that fluoroquinolone antibiotics could be associated with an increased risk of aortic aneurysm Fluoroquinolones have non-antimicrobial properties that might jeopardise the integrity of the extracellular matrix of the vascular wall In a  propensity score matched cohort study in [place], there was a 66% increased rate of aortic aneurysm or dissection associated with oral fluoroquinolone use, compared wit  . Rosuvastatin Other (See Comments)    Restless legs  . Simvastatin Other (See Comments)    Myalgias    ROS: Pertinent items noted in HPI and remainder of comprehensive ROS otherwise negative.  HOME MEDS: Current Outpatient Medications on File Prior to Visit  Medication Sig Dispense Refill  . ALPRAZolam (XANAX) 0.25 MG tablet TAKE 2 TABLETS (0.5MG) BY MOUTH AT BEDTIME AS NEEDED FOR ANXIETY. 60 tablet 0  . amLODipine (NORVASC)  10 MG tablet Take 10 mg by mouth daily.    Marland Kitchen aspirin EC  81 MG tablet Take 81 mg by mouth daily.    . benazepril (LOTENSIN) 20 MG tablet TAKE 1 TABLET BY MOUTH EVERY DAY 90 tablet 2  . butorphanol (STADOL) 10 MG/ML nasal spray INSTILL 1 SPRAY INTO ONE NOSTRIL ONCE DAILY IF NEEDED 2.5 mL 0  . clopidogrel (PLAVIX) 75 MG tablet TAKE 1 TABLET BY MOUTH EVERY DAY 90 tablet 2  . HYDROcodone-acetaminophen (NORCO/VICODIN) 5-325 MG tablet Take 1 tablet by mouth every 6 (six) hours as needed for moderate pain. 30 tablet 0  . ibuprofen (ADVIL,MOTRIN) 200 MG tablet Take 400 mg by mouth 2 (two) times daily as needed for headache or moderate pain.     . metoprolol succinate (TOPROL XL) 25 MG 24 hr tablet Take 0.5 tablets (12.5 mg total) by mouth 2 (two) times daily. 30 tablet 3  . nitroGLYCERIN (NITROSTAT) 0.4 MG SL tablet Place 1 tablet (0.4 mg total) under the tongue every 5 (five) minutes as needed for chest pain. 25 tablet 3  . pantoprazole (PROTONIX) 40 MG tablet TAKE 1 TABLET BY MOUTH EVERY DAY BEFORE BREAKFAST 90 tablet 3  . varenicline (CHANTIX CONTINUING MONTH PAK) 1 MG tablet Take 1 tablet (1 mg total) by mouth 2 (two) times daily. 60 tablet 2   No current facility-administered medications on file prior to visit.     LABS/IMAGING: No results found for this or any previous visit (from the past 48 hour(s)). No results found.  WEIGHTS: Wt Readings from Last 3 Encounters:  05/21/18 170 lb (77.1 kg)  04/15/18 164 lb 14.4 oz (74.8 kg)  03/20/18 164 lb (74.4 kg)    VITALS: BP (!) 130/98   Pulse 66   Ht 5' 8"  (1.727 m)   Wt 170 lb (77.1 kg)   BMI 25.85 kg/m   EXAM: Deferred  EKG: Deferred  ASSESSMENT: 1. PAT 2. Moderate nonobstructive CAD with history of MI in 2005 3. Recurrent TIAs without clear etiology 4. 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 Multiple bilateral pulmonary nodules 6. 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 Tobacco abuse - quit x 1 month ago 10. Statin intolerance  PLAN: 1.   Janet [name] had improvement in her PAT and palpitations on increased dose beta-blocker.  We will need to repeat her lipid profile.  Unfortunately she did not respond well to PCSK9 inhibitors.  We may need to consider additional genetic testing and other options for her.  She has a long list of medication intolerances and unfortunately not been able to tolerate statins or ezetimibe.  Pixie Casino, MD, [facility], [place] Director of the Advanced Lipid Disorders &  Cardiovascular Risk Reduction Clinic Attending Cardiologist  Direct Dial: [phone]  Fax: [phone]  Website:  [email or web address] 05/21/2018, 1:51 PM

## 2018-05-22 ENCOUNTER — Other Ambulatory Visit: Payer: Self-pay

## 2018-05-22 ENCOUNTER — Ambulatory Visit (INDEPENDENT_AMBULATORY_CARE_PROVIDER_SITE_OTHER): Payer: 59 | Admitting: *Deleted

## 2018-05-22 ENCOUNTER — Encounter: Payer: Self-pay | Admitting: Internal Medicine

## 2018-05-22 ENCOUNTER — Other Ambulatory Visit: Payer: 59

## 2018-05-22 DIAGNOSIS — Z23 Encounter for immunization: Secondary | ICD-10-CM | POA: Diagnosis not present

## 2018-05-23 DIAGNOSIS — E785 Hyperlipidemia, unspecified: Secondary | ICD-10-CM | POA: Diagnosis not present

## 2018-05-24 LAB — LIPID PANEL
CHOL/HDL RATIO: 4.3 ratio (ref 0.0–4.4)
Cholesterol, Total: 299 mg/dL — ABNORMAL HIGH (ref 100–199)
HDL: 69 mg/dL (ref >39)
LDL Calculated: 191 mg/dL — ABNORMAL HIGH (ref 0–99)
Triglycerides: 197 mg/dL — ABNORMAL HIGH (ref 0–149)
VLDL CHOLESTEROL CAL: 39 mg/dL (ref 5–40)

## 2018-05-24 LAB — LDL CHOLESTEROL, DIRECT: LDL DIRECT: 206 mg/dL — AB (ref 0–99)

## 2018-06-02 LAB — SPECIMEN STATUS REPORT

## 2018-06-02 LAB — LIPOPROTEIN A (LPA): Lipoprotein (a): 9.5 nmol/L (ref <75.0)

## 2018-06-06 DIAGNOSIS — E7849 Other hyperlipidemia: Secondary | ICD-10-CM

## 2018-06-11 ENCOUNTER — Other Ambulatory Visit: Payer: Self-pay | Admitting: Family Medicine

## 2018-06-16 ENCOUNTER — Inpatient Hospital Stay: Payer: 59 | Attending: Oncology

## 2018-06-16 ENCOUNTER — Inpatient Hospital Stay: Payer: 59

## 2018-06-16 DIAGNOSIS — Z87891 Personal history of nicotine dependence: Secondary | ICD-10-CM | POA: Diagnosis not present

## 2018-06-16 DIAGNOSIS — Z7982 Long term (current) use of aspirin: Secondary | ICD-10-CM | POA: Insufficient documentation

## 2018-06-16 DIAGNOSIS — D751 Secondary polycythemia: Secondary | ICD-10-CM

## 2018-06-16 DIAGNOSIS — I10 Essential (primary) hypertension: Secondary | ICD-10-CM | POA: Diagnosis not present

## 2018-06-16 DIAGNOSIS — Z79899 Other long term (current) drug therapy: Secondary | ICD-10-CM | POA: Insufficient documentation

## 2018-06-16 LAB — CBC
HCT: 50 % — ABNORMAL HIGH (ref 36.0–46.0)
Hemoglobin: 16.1 g/dL — ABNORMAL HIGH (ref 12.0–15.0)
MCH: 28.6 pg (ref 26.0–34.0)
MCHC: 32.2 g/dL (ref 30.0–36.0)
MCV: 89 fL (ref 80.0–100.0)
PLATELETS: 347 10*3/uL (ref 150–400)
RBC: 5.62 MIL/uL — ABNORMAL HIGH (ref 3.87–5.11)
RDW: 19.1 % — ABNORMAL HIGH (ref 11.5–15.5)
WBC: 11.4 10*3/uL — ABNORMAL HIGH (ref 4.0–10.5)
nRBC: 0 % (ref 0.0–0.2)

## 2018-06-16 MED ORDER — ICOSAPENT ETHYL 1 G PO CAPS: 2.0000 | ORAL_CAPSULE | Freq: Two times a day (BID) | ORAL | 3 refills | Status: DC

## 2018-06-17 DIAGNOSIS — G4733 Obstructive sleep apnea (adult) (pediatric): Secondary | ICD-10-CM | POA: Diagnosis not present

## 2018-06-18 ENCOUNTER — Telehealth: Payer: Self-pay | Admitting: Internal Medicine

## 2018-06-18 NOTE — Telephone Encounter (Signed)
[employer] called about prior auth for Icosapent Ethyl (VASCEPA) 1 g CAPS, it was denied you do have 180 days to submit additional information to [employer] , they will take any additional lab or clinical information you may have instead of going straight to an appeal. [place] key: [id number] , and they can be called [phone].

## 2018-06-18 NOTE — Telephone Encounter (Signed)
Returned call to [employer]. Per the representative there was information missing from the prior auth for Vascepa completed on 06/17/18. I was adv that another prior Josem Kaufmann would need be completed. Prior auth completed over the phone and a decision will be made and faxed to the office in 2-3 days.

## 2018-06-20 NOTE — Telephone Encounter (Signed)
See below . Thanks

## 2018-06-20 NOTE — Telephone Encounter (Signed)
  Logan from [employer] is calling back in regards to PA for Vascepa Ref [id number]

## 2018-06-23 NOTE — Telephone Encounter (Signed)
Denial received from [facility] for PA for Rush Farmer  [id number]. [phone] Dr.Hilty made aware of the denial and will initiate the appeals process.

## 2018-06-23 NOTE — Telephone Encounter (Signed)
Responded via [employer].

## 2018-06-30 NOTE — Progress Notes (Signed)
[facility]  PATIENT: Janet [name] DOB: [birth year]   REASON FOR VISIT: Follow-up for moderate to severe obstructive sleep apnea with initial CPAP HISTORY FROM: Patient    HISTORY OF PRESENT ILLNESS:UPDATE 3/3/2020CM Janet [name], 53 year old female returns for follow-up with history of severe obstructive sleep apnea newly diagnosed here for initial CPAP compliance.  She is doing fine with his machine.  Daytime drowsiness has improved.  Compliance data dated 06/01/2018-06/30/2018 shows compliance greater than 4 hours at 90%.  Average usage 5 hours 41 minutes.  Set pressure 5 to 15 cm.  EPR level 3 leak 95th percentile 13.2.  AHI 0.5 ESS 1.  She returns for reevaluation   10/22/19CD Janet [name] is a 53 y.o. female patient, seen here on 02-18-2018 upon referral from Dr. Dennard Schaumann for evaluation of polycythemia, an possible sleep apnea or sleep hypoxemea.  Mrs. Peitz was diagnosed with polycythemia in April of this year, at the time she had successfully quit smoking but soon after relapsed.  She is currently smoking half pack per day. She feels very fatigued but reports she sleeps well, "hard".  She is losing hair and reports unintentional weight loss.  Last Colonoscopy was 3-4 years ago by Dr. Carlean Purl, and an upper endoscopy was performed in 2018 - all normal. She stopped Protonix recently.  She has CAD at age 70 year, and has had an MI at age 35, she has had strokes , followed by Dr Loretta Plume.    Sleep habits are as follows: The patient reports that she is having a regular bedtime around 10 PM, goes to bed and falls asleep promptly.  She usually can stay asleep several hours - 5 - until a bathroom call wakes her.  She is not aware of snoring, she has not been told that she has apnea.  Her husband actually has confirmed to her that she does not snore when she asked. Her bedroom is described as cool, quiet and dark and she shares a bedroom with her husband, she stopped off  sleeping prone but ends up waking when she is supine.  She sleeps on one pillow only.  The bed is flat, nonadjustable. She rises at 8 am. One nocturia. Wakes up refreshed - no dry mouth, no headaches, no dizziness.   REVIEW OF SYSTEMS: Full 14 system review of systems performed and notable only for those listed, all others are neg:  Constitutional: neg  Cardiovascular: neg Ear/Nose/Throat: neg  Skin: neg Eyes: neg Respiratory: neg Gastroitestinal: neg  Hematology/Lymphatic: neg  Endocrine: neg Musculoskeletal:neg Allergy/Immunology: neg Neurological: neg Psychiatric: neg Sleep : Obstructive sleep apnea with CPAP   ALLERGIES: Allergies  Allergen Reactions  . Aggrenox [Aspirin-Dipyridamole Er] Tinitus  . Atorvastatin Other (See Comments)    Myalgias   . Codeine Itching and Other (See Comments)    Nose itches  . Imitrex [Sumatriptan Base] Swelling and Other (See Comments)    Throat swelling  . Isosorbide Nitrate Other (See Comments)    headaches  . Other Other (See Comments)    Artificial sweeteners cause heart palpitations (stevia is ok)  . Quinolones     Patient was warned about not using Cipro and similar antibiotics. Recent studies have raised concern that fluoroquinolone antibiotics could be associated with an increased risk of aortic aneurysm Fluoroquinolones have non-antimicrobial properties that might jeopardise the integrity of the extracellular matrix of the vascular wall In a  propensity score matched cohort study in [place], there was a 66% increased rate of aortic  aneurysm or dissection associated with oral fluoroquinolone use, compared wit  . Rosuvastatin Other (See Comments)    Restless legs  . Simvastatin Other (See Comments)    Myalgias    HOME MEDICATIONS: Outpatient Medications Prior to Visit  Medication Sig Dispense Refill  . ALPRAZolam (XANAX) 0.25 MG tablet TAKE 2 TABLETS (0.5MG) BY MOUTH AT BEDTIME AS NEEDED FOR ANXIETY. 60 tablet 0  . amLODipine  (NORVASC) 10 MG tablet Take 10 mg by mouth daily.    Marland Kitchen aspirin EC 81 MG tablet Take 81 mg by mouth daily.    . benazepril (LOTENSIN) 20 MG tablet TAKE 1 TABLET BY MOUTH EVERY DAY 90 tablet 2  . butorphanol (STADOL) 10 MG/ML nasal spray INSTILL 1 SPRAY INTO ONE NOSTRIL ONCE DAILY IF NEEDED 2.5 mL 0  . clopidogrel (PLAVIX) 75 MG tablet TAKE 1 TABLET BY MOUTH EVERY DAY 90 tablet 2  . HYDROcodone-acetaminophen (NORCO/VICODIN) 5-325 MG tablet Take 1 tablet by mouth every 6 (six) hours as needed for moderate pain. 30 tablet 0  . ibuprofen (ADVIL,MOTRIN) 200 MG tablet Take 400 mg by mouth 2 (two) times daily as needed for headache or moderate pain.     . metoprolol succinate (TOPROL XL) 25 MG 24 hr tablet Take 0.5 tablets (12.5 mg total) by mouth 2 (two) times daily. 30 tablet 3  . nitroGLYCERIN (NITROSTAT) 0.4 MG SL tablet Place 1 tablet (0.4 mg total) under the tongue every 5 (five) minutes as needed for chest pain. 25 tablet 3  . pantoprazole (PROTONIX) 40 MG tablet TAKE 1 TABLET BY MOUTH EVERY DAY BEFORE BREAKFAST 90 tablet 3  . varenicline (CHANTIX CONTINUING MONTH PAK) 1 MG tablet Take 1 tablet (1 mg total) by mouth 2 (two) times daily. 60 tablet 2  . Icosapent Ethyl (VASCEPA) 1 g CAPS Take 2 capsules (2 g total) by mouth 2 (two) times daily. (Patient not taking: Reported on 07/01/2018) 180 capsule 3   No facility-administered medications prior to visit.     PAST MEDICAL HISTORY: Past Medical History:  Diagnosis Date  . Adenoma of right adrenal gland    Benign  . Allergy    SEASONAL  . Anemia   . Anxiety   . Arthritis   . Asthma   . Barrett's esophagus 02/09/2014  . CAD (coronary artery disease)    a. prior MI in 2005 with 60-70% dLAD stenosis and 60-70% D2 stenosis (concern for coronary vasospasm)  . Depression   . Focal nodular hyperplasia of liver   . GERD (gastroesophageal reflux disease)   . Heart murmur    AS A CHILD  . History of adenomatous polyp of colon 02/09/2014  . HLD  (hyperlipidemia)   . HTN (hypertension)   . Liver hemangioma   . Migraine headache   . Myocardial infarction ([place])   . NASH (nonalcoholic steatohepatitis)    nash  . OSA (obstructive sleep apnea)    ahi 21.7  . Palpitations   . Sinus complaint   . Stroke ([place])   . Unstable angina ([place]) 05/15/2017    PAST SURGICAL HISTORY: Past Surgical History:  Procedure Laterality Date  . ABDOMINOPLASTY    . BLADDER SUSPENSION    . CERVICAL FUSION     C4, 5, 6  . COLONOSCOPY W/ BIOPSIES    . ESOPHAGOGASTRODUODENOSCOPY    . LEFT HEART CATH AND CORONARY ANGIOGRAPHY N/A 05/16/2017   Procedure: LEFT HEART CATH AND CORONARY ANGIOGRAPHY;  Surgeon: Troy Sine, MD;  Location: [facility];  Service: Cardiovascular;  Laterality: N/A;  . TONSILLECTOMY    . TUBAL LIGATION      FAMILY HISTORY: Family History  Problem Relation Age of Onset  . Coronary artery disease Father   . Aneurysm Father        AAA  . Melanoma Father   . Transient ischemic attack Father   . Heart disease Father   . AAA (abdominal aortic aneurysm) Father   . Aneurysm Mother        AAA  . Colon polyps Mother   . Heart disease Mother   . Irritable bowel syndrome Mother   . Diverticulitis Mother   . Other Mother        Evlyn Clines  . AAA (abdominal aortic aneurysm) Mother   . Brain cancer Mother   . Breast cancer Paternal Aunt   . Liver disease Maternal Uncle   . Heart disease Brother   . Prostate cancer Paternal Uncle   . Mesothelioma Paternal Uncle   . Colon cancer Neg Hx     SOCIAL HISTORY: Social History   Socioeconomic History  . Marital status: Married    Spouse name: Not on file  . Number of children: 1  . Years of education: Not on file  . Highest education level: Not on file  Occupational History  . Occupation: MECHANICAL DESIGNER    Employer: [employer]  Social Needs  . Financial resource strain: Not on file  . Food insecurity:    Worry: Not on file    Inability: Not on file  .  Transportation needs:    Medical: Not on file    Non-medical: Not on file  Tobacco Use  . Smoking status: Former Smoker    Packs/day: 0.25    Years: 30.00    Pack years: 7.50    Types: Cigarettes  . Smokeless tobacco: Never Used  . Tobacco comment: restarted 3 weeks ago  Substance and Sexual Activity  . Alcohol use: Yes    Comment: occasional; 1 per day  . Drug use: No  . Sexual activity: Not on file  Lifestyle  . Physical activity:    Days per week: Not on file    Minutes per session: Not on file  . Stress: Not on file  Relationships  . Social connections:    Talks on phone: Not on file    Gets together: Not on file    Attends religious service: Not on file    Active member of club or organization: Not on file    Attends meetings of clubs or organizations: Not on file    Relationship status: Not on file  . Intimate partner violence:    Fear of current or ex partner: Not on file    Emotionally abused: Not on file    Physically abused: Not on file    Forced sexual activity: Not on file  Other Topics Concern  . Not on file  Social History Narrative   She is married with 1 son   She is a Tax inspector at a factory   Former smoker   Alcohol is used at least once a week glass of wine   Denies drug use     PHYSICAL EXAM  Vitals:   07/01/18 1255  BP: (!) 152/104  Pulse: 74  Weight: 176 lb 6.4 oz (80 kg)  Height: 5' 8"  (1.727 m)   Body mass index is 26.82 kg/m.  Generalized: Well developed, in no acute distress  Head:  normocephalic and atraumatic,. Oropharynx benign Mallopatti2 Neck: Supple, circumference 15 Lungs: Clear Musculoskeletal: No deformity  Skin no rash or edema Neurological examination   Mentation: Alert oriented to time, place, history taking. Attention span and concentration appropriate. Recent and remote memory intact.  Follows all commands speech and language fluent.   Cranial nerve II-XII: Pupils were equal round reactive to light  extraocular movements were full, visual field were full on confrontational test. Facial sensation and strength were normal. hearing was intact to finger rubbing bilaterally. Uvula tongue midline. head turning and shoulder shrug were normal and symmetric.Tongue protrusion into cheek strength was normal. Motor: normal bulk and tone, full strength in the BUE, BLE,  Sensory: normal and symmetric to light touch,  Coordination: finger-nose-finger, heel-to-shin bilaterally, no dysmetria Gait and Station: Rising up from seated position without assistance, normal stance,  moderate stride, good arm swing, smooth turning, able to perform tiptoe, and heel walking without difficulty. Tandem gait is steady  DIAGNOSTIC DATA (LABS, IMAGING, TESTING) - I reviewed patient records, labs, notes, testing and imaging myself where available.  Lab Results  Component Value Date   WBC 11.4 (H) 06/16/2018   HGB 16.1 (H) 06/16/2018   HCT 50.0 (H) 06/16/2018   MCV 89.0 06/16/2018   PLT 347 06/16/2018      Component Value Date/Time   NA 137 08/22/2017 1500   NA 141 06/04/2017 1135   K 4.6 08/22/2017 1500   CL 102 08/22/2017 1500   CO2 26 08/22/2017 1500   GLUCOSE 111 (H) 08/22/2017 1500   BUN 24 (H) 08/22/2017 1500   BUN 10 06/04/2017 1135   CREATININE 0.70 01/15/2018 1032   CREATININE 0.74 08/13/2017 0927   CALCIUM 9.4 08/22/2017 1500   PROT 7.5 11/19/2017 1138   ALBUMIN 4.8 11/19/2017 1138   AST 78 (H) 11/19/2017 1138   ALT 90 (H) 11/19/2017 1138   ALKPHOS 84 11/19/2017 1138   BILITOT 0.6 11/19/2017 1138   GFRNONAA >60 08/22/2017 1500   GFRNONAA 92 08/13/2017 0927   GFRAA >60 08/22/2017 1500   GFRAA 106 08/13/2017 0927   Lab Results  Component Value Date   CHOL 299 (H) 05/23/2018   HDL 69 05/23/2018   LDLCALC 191 (H) 05/23/2018   LDLDIRECT 206 (H) 05/23/2018   TRIG 197 (H) 05/23/2018   CHOLHDL 4.3 05/23/2018   Lab Results  Component Value Date   HGBA1C 5.8 (H) 08/16/2015   Lab Results    Component Value Date   VITAMINB12 615 02/04/2018   Lab Results  Component Value Date   TSH 3.64 08/13/2017      ASSESSMENT AND PLAN  53 y.o. year old female  has a past medical history of Adenoma of right adrenal gland,  Anemia, Anxiety, Arthritis, Asthma, Barrett's esophagus (02/09/2014), CAD (coronary artery disease), Depression, Focal nodular hyperplasia of liver, GERD (gastroesophageal reflux disease), Heart murmur,  HLD (hyperlipidemia), HTN (hypertension), Liver hemangioma, Migraine headache, Myocardial infarction ([place]), NASH (nonalcoholic steatohepatitis), OSA (obstructive sleep apnea), Palpitations, Sinus complaint, Stroke ([place]), and Unstable angina ([place]) (05/15/2017). here to follow-up for severe obstructive sleep apnea with  initial CPAP.Compliance data dated 06/01/2018-06/30/2018 shows compliance greater than 4 hours at 90%.  Average usage 5 hours 41 minutes.  Set pressure 5 to 15 cm.  EPR level 3 leak 95th percentile 13.2.  AHI 0.5 ESS 1.    PLAN: CPAP compliance 90% reviewed data with patient Continue same settings Follow-up in 6 months then yearly Dennie Bible, [facility], [facility], [facility]  [address], [place], [place] [address] [phone]

## 2018-07-01 ENCOUNTER — Ambulatory Visit: Payer: 59 | Admitting: Nurse Practitioner

## 2018-07-01 ENCOUNTER — Encounter: Payer: Self-pay | Admitting: Nurse Practitioner

## 2018-07-01 DIAGNOSIS — Z9989 Dependence on other enabling machines and devices: Secondary | ICD-10-CM | POA: Diagnosis not present

## 2018-07-01 DIAGNOSIS — G4733 Obstructive sleep apnea (adult) (pediatric): Secondary | ICD-10-CM | POA: Diagnosis not present

## 2018-07-01 NOTE — Patient Instructions (Signed)
CPAP compliance 90% Continue same settings Follow-up in 6 months then yearly

## 2018-07-15 ENCOUNTER — Ambulatory Visit: Payer: 59 | Admitting: Oncology

## 2018-07-15 ENCOUNTER — Other Ambulatory Visit: Payer: 59

## 2018-07-16 ENCOUNTER — Ambulatory Visit: Payer: 59 | Admitting: Internal Medicine

## 2018-07-16 DIAGNOSIS — G4733 Obstructive sleep apnea (adult) (pediatric): Secondary | ICD-10-CM | POA: Diagnosis not present

## 2018-07-18 ENCOUNTER — Other Ambulatory Visit: Payer: Self-pay

## 2018-07-18 ENCOUNTER — Telehealth: Payer: Self-pay

## 2018-07-18 NOTE — Telephone Encounter (Signed)
Called patient and moved her our 4 weeks. 4/23appt. 1:15 for labs and then see md with poss. Phlebotomy and patient agreeable and has my chart and will plug appt in her phone

## 2018-07-18 NOTE — Telephone Encounter (Signed)
Called patient to do her travel screening. Patient stated that she was with her sister this weekend and she started to have a cough and a fever. Patient's sister had been tested but do not have results as of yet. They are both quarantine for precaution. Patient is not experiencing any symptoms. Patient would like to reschedule her appointment. Please give her a call once it had been rescheduled. Thank you.

## 2018-07-21 ENCOUNTER — Inpatient Hospital Stay: Payer: 59

## 2018-07-21 ENCOUNTER — Inpatient Hospital Stay: Payer: 59 | Admitting: Oncology

## 2018-07-25 ENCOUNTER — Other Ambulatory Visit: Payer: Self-pay | Admitting: Family Medicine

## 2018-07-25 DIAGNOSIS — G459 Transient cerebral ischemic attack, unspecified: Secondary | ICD-10-CM

## 2018-07-25 MED ORDER — CLOPIDOGREL BISULFATE 75 MG PO TABS: 75.0000 mg | ORAL_TABLET | Freq: Every day | ORAL | 2 refills | Status: DC

## 2018-08-06 ENCOUNTER — Other Ambulatory Visit: Payer: Self-pay

## 2018-08-06 MED ORDER — METOPROLOL SUCCINATE ER 25 MG PO TB24: 12.5000 mg | ORAL_TABLET | Freq: Two times a day (BID) | ORAL | 6 refills | Status: DC

## 2018-08-12 ENCOUNTER — Encounter: Payer: Self-pay | Admitting: Family Medicine

## 2018-08-12 MED ORDER — ALPRAZOLAM 0.25 MG PO TABS: ORAL_TABLET | ORAL | 0 refills | Status: DC

## 2018-08-12 NOTE — Telephone Encounter (Signed)
Ok to refill??  Last office visit 03/14/2018.  Last refill 05/19/2018.

## 2018-08-16 DIAGNOSIS — G4733 Obstructive sleep apnea (adult) (pediatric): Secondary | ICD-10-CM | POA: Diagnosis not present

## 2018-08-21 ENCOUNTER — Ambulatory Visit: Payer: 59 | Admitting: Oncology

## 2018-08-21 ENCOUNTER — Inpatient Hospital Stay: Payer: 59

## 2018-08-21 ENCOUNTER — Other Ambulatory Visit: Payer: 59

## 2018-08-27 DIAGNOSIS — Z736 Limitation of activities due to disability: Secondary | ICD-10-CM

## 2018-09-09 ENCOUNTER — Other Ambulatory Visit: Payer: Self-pay

## 2018-09-09 ENCOUNTER — Ambulatory Visit (INDEPENDENT_AMBULATORY_CARE_PROVIDER_SITE_OTHER): Payer: 59 | Admitting: Family Medicine

## 2018-09-09 ENCOUNTER — Encounter: Payer: Self-pay | Admitting: Family Medicine

## 2018-09-09 VITALS — BP 156/100 | HR 88 | Temp 98.7°F | Resp 16 | Ht 68.0 in | Wt 180.0 lb

## 2018-09-09 DIAGNOSIS — R5382 Chronic fatigue, unspecified: Secondary | ICD-10-CM | POA: Diagnosis not present

## 2018-09-09 DIAGNOSIS — E7800 Pure hypercholesterolemia, unspecified: Secondary | ICD-10-CM

## 2018-09-09 DIAGNOSIS — G4733 Obstructive sleep apnea (adult) (pediatric): Secondary | ICD-10-CM | POA: Diagnosis not present

## 2018-09-09 DIAGNOSIS — K7581 Nonalcoholic steatohepatitis (NASH): Secondary | ICD-10-CM

## 2018-09-09 DIAGNOSIS — K7689 Other specified diseases of liver: Secondary | ICD-10-CM

## 2018-09-09 DIAGNOSIS — Z9989 Dependence on other enabling machines and devices: Secondary | ICD-10-CM

## 2018-09-09 DIAGNOSIS — Z0001 Encounter for general adult medical examination with abnormal findings: Secondary | ICD-10-CM

## 2018-09-09 DIAGNOSIS — M2550 Pain in unspecified joint: Secondary | ICD-10-CM

## 2018-09-09 DIAGNOSIS — D751 Secondary polycythemia: Secondary | ICD-10-CM

## 2018-09-09 DIAGNOSIS — Z0000 Encounter for general adult medical examination without abnormal findings: Secondary | ICD-10-CM

## 2018-09-09 DIAGNOSIS — Z1239 Encounter for other screening for malignant neoplasm of breast: Secondary | ICD-10-CM

## 2018-09-09 DIAGNOSIS — I2510 Atherosclerotic heart disease of native coronary artery without angina pectoris: Secondary | ICD-10-CM

## 2018-09-09 MED ORDER — HYDROCHLOROTHIAZIDE 25 MG PO TABS: 25.0000 mg | ORAL_TABLET | Freq: Every day | ORAL | 3 refills | Status: AC

## 2018-09-09 MED ORDER — ROSUVASTATIN CALCIUM 5 MG PO TABS: 5.0000 mg | ORAL_TABLET | Freq: Every day | ORAL | 3 refills | Status: AC

## 2018-09-09 NOTE — Progress Notes (Signed)
Subjective:    Patient ID: Janet [name], female    DOB: 01-14-1966, 53 y.o.   MRN: [id number]  HPI  08/13/17 Patient presents with greater than 4 months of pain in her right shoulder.  She has pain with abduction greater than 90 degrees.  She has pain with internal and external rotation.  She has a positive empty can sign.  She has a positive drop test.  She has pain with Hawkins maneuver.  She also has a positive O'Brien sign.  Passive range of motion is limited to approximately 100 degrees without pain.  She has decreased strength with resisted abduction. Patient also presents today with fatigue.  Patient states that she has no energy.  She easily becomes fatigued and tired with minimal activity.  She denies any chest pain.  She has no significant shortness of breath.  She denies any night sweats fevers or hemoptysis.  She does have a remote history of smoking.  However she has quit.  Her blood pressure has been relatively low at home.  She had a near presyncopal episode when her blood pressure was 100/60.  Cardiology just started her on metoprolol due to palpitations and her heart.  However she is also on amlodipine and she believes the blood pressure may be too low.  At that time, my plan was: Using sterile technique, I injected the right shoulder with 2 cc of lidocaine, 2 cc of Marcaine, and 2 cc of 40 mg/mL Kenalog.  Patient tolerated procedure well.  If pain does not improve a cortisone injection, I would recommend an MRI of the shoulder to evaluate for rotator cuff tear.  Given her chronic fatigue, I will begin the workup by obtaining baseline laboratory values including a CBC to monitor for anemia or any bone marrow abnormalities, CMP to evaluate for electrolyte dysfunctions or renal or hepatic disease.  I will check a TSH to evaluate for hypothyroidism.  I will check a vitamin B12 to evaluate for vitamin deficiency.  I will check a cortisol level to evaluate for adrenal insufficiency.  If  labs are completely normal, I will also check a sed rate to evaluate for autoimmune diseases.  If this is normal, I would begin a workup for malignancy starting with regular mammogram, colonoscopy, Pap smear, and CXR.  02/03/18 Patient was referred to Oncology for polycythemia.  I have copied Dr. Elroy Channel most recent A/P for a synopsis of the work up thus far:  Assessment and plan- Patient is a 53 y.o. 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  Jak 2 mutation testing was negative.  Patient does not have primary polycythemia.  Patient was noted to have polycythemia even before she started smoking.  She does not report any symptoms of obstructive sleep apnea but I would recommend that her primary care doctor should look into investigating obstructive sleep apnea as a possible cause of her polycythemia.  Obstructive sleep apnea and undiagnosed can also lead to chronic fatigue.  Patient reports no improvement in her symptoms despite a phlebotomy.  I will keep her goal hematocrit for phlebotomy to be greater than 50.  She will get phlebotomy today.  However I will hold off on further phlebotomies and repeat her CBC with differential in 1 month's time for a possible phlebotomy and see her at that time.  Patient reports persistent fatigue loss of  appetite as well as weight loss.  No discernible malignancy so far.  Her CT abdomen and pelvis was negative for malignancy and bone marrow biopsy also did not reveal any evidence of myeloproliferative process or lymphoma or leukemia.  She did get a repeat CT chest on 01/30/2018 2 days after her appointment which I reviewed today and again that does not show any evidence of malignancy.  Recommend follow-up with PCP to evaluate her ongoing symptoms of fatigue.  Consider ruling out endocrinopathies such  as adrenal disorders.   Patient continues to complain of severe fatigue.  She states she can barely even walk from her car into the building at times due to how weak and tired she feels.  She has no stamina.  She has very little energy.  Continues to smoke.  She denies any snoring or witnessed apneic events.  She does not believe she has sleep apnea.  This occurred suddenly earlier this spring after a camping trip.  She denies any rash consistent with Lyme disease however she states that it is possible.  She wants to be checked for vitamin B12 deficiency as well as celiac disease.  She does have severe diarrhea.  She is also reporting location.  She states that her hair falls out throughout her scalp and no specific area more than it ever has in the past.  There is no visible patches of hair loss but there is a generalized thinning of her hair.  She had a catheterization performed in January that showed noncritical coronary artery disease and ejection fraction of 65%.  She had a CT angiogram of the chest, as well as a CT of the abdomen and pelvis that revealed no specific cause that would explain her fatigue.  At that time, my plan was: I spent more than 25 minutes today with the patient discussing her work-up today, reviewing her medical records, and discussing possible causes of her fatigue.  I recommended a sleep study given the severity of her fatigue and her polycythemia.  Given the occurrence after a camping trip, I recommended Lyme titers to evaluate for possible chronic Lyme disease.  I will check a vitamin B12 although this was checked earlier this year and was found to be normal.  I will also check a cortisol level first thing in the morning in addition to an ACTH to determine if there is adrenal insufficiency.  09/09/18 Work-up for chronic fatigue ultimately led to a sleep study that showed moderate to severe obstructive sleep apnea however since starting CPAP she is seen no benefit and continues to  have severe fatigue.  She states that she walks to the mailbox and has to stop and take a break just due to lack of energy to walk back.  Also evaluate the patient for Lyme disease.  Initial antibody testing was equivocal however follow-up testing on October 16 was nonreactive for any antibodies to Borrelia burgdorferi.  Therefore my leading assumption has been chronic fatigue syndrome.  However now the patient is reporting severe arthralgias in both hands, both wrists, both hips, both ankles and in her right shoulder.  She states is hard for her to get in and out of a chair.  This certainly raises concern for possible autoimmune diseases versus fibromyalgia.  The patient is currently seeking disability and wants a physical exam today.  Her mammogram was performed annually at her gynecologist.  Her Pap smear was performed annually at her gynecologist.  This is up-to-date.  Her last colonoscopy was  in 2015 and was significant for polyps.  They have recommended a repeat colonoscopy this year in October.  Patient will schedule this later.  She is due for HIV screening but she declines this.  She has a history of coronary artery disease with uncontrolled hyperlipidemia.  She has not been able to tolerate any statin and she discontinued Praluent after 3 months because she saw no benefit in her lipid panel.  She continues to smoke.  She wants to discontinue amlodipine given that her blood pressure is elevated due to swelling in her ankles and legs that occurs on the medication.  Past Medical History:  Diagnosis Date   Adenoma of right adrenal gland    Benign   Allergy    SEASONAL   Anemia    Anxiety    Arthritis    Asthma    Barrett's esophagus 02/09/2014   CAD (coronary artery disease)    a. prior MI in 2005 with 60-70% dLAD stenosis and 60-70% D2 stenosis (concern for coronary vasospasm)   Depression    Focal nodular hyperplasia of liver    GERD (gastroesophageal reflux disease)    Heart  murmur    AS A CHILD   History of adenomatous polyp of colon 02/09/2014   HLD (hyperlipidemia)    HTN (hypertension)    Liver hemangioma    Migraine headache    Myocardial infarction (HCC)    NASH (nonalcoholic steatohepatitis)    [facility]   OSA (obstructive sleep apnea)    ahi 21.7   Palpitations    Sinus complaint    Stroke [facility])    Unstable angina ([facility]) 05/15/2017   Past Surgical History:  Procedure Laterality Date   ABDOMINOPLASTY     BLADDER SUSPENSION     CERVICAL FUSION     C4, 5, 6   COLONOSCOPY W/ BIOPSIES     ESOPHAGOGASTRODUODENOSCOPY     LEFT HEART CATH AND CORONARY ANGIOGRAPHY N/A 05/16/2017   Procedure: LEFT HEART CATH AND CORONARY ANGIOGRAPHY;  Surgeon: Troy Sine, MD;  Location: [facility];  Service: Cardiovascular;  Laterality: N/A;   TONSILLECTOMY     TUBAL LIGATION     Current Outpatient Medications on File Prior to Visit  Medication Sig Dispense Refill   ALPRAZolam (XANAX) 0.25 MG tablet TAKE 2 TABLETS (0.5MG) BY MOUTH AT BEDTIME AS NEEDED FOR ANXIETY. 60 tablet 0   amLODipine (NORVASC) 10 MG tablet Take 10 mg by mouth daily.     aspirin EC 81 MG tablet Take 81 mg by mouth daily.     benazepril (LOTENSIN) 20 MG tablet TAKE 1 TABLET BY MOUTH EVERY DAY 90 tablet 2   butorphanol (STADOL) 10 MG/ML nasal spray INSTILL 1 SPRAY INTO ONE NOSTRIL ONCE DAILY IF NEEDED 2.5 mL 0   clopidogrel (PLAVIX) 75 MG tablet Take 1 tablet (75 mg total) by mouth daily. 90 tablet 2   HYDROcodone-acetaminophen (NORCO/VICODIN) 5-325 MG tablet Take 1 tablet by mouth every 6 (six) hours as needed for moderate pain. 30 tablet 0   ibuprofen (ADVIL,MOTRIN) 200 MG tablet Take 400 mg by mouth 2 (two) times daily as needed for headache or moderate pain.      metoprolol succinate (TOPROL XL) 25 MG 24 hr tablet Take 0.5 tablets (12.5 mg total) by mouth 2 (two) times daily. 30 tablet 6   nitroGLYCERIN (NITROSTAT) 0.4 MG SL tablet Place 1 tablet (0.4 mg  total) under the tongue every 5 (five) minutes as needed for chest pain. 25  tablet 3   pantoprazole (PROTONIX) 40 MG tablet TAKE 1 TABLET BY MOUTH EVERY DAY BEFORE BREAKFAST 90 tablet 3   No current facility-administered medications on file prior to visit.    Allergies  Allergen Reactions   Aggrenox [Aspirin-Dipyridamole Er] Tinitus   Atorvastatin Other (See Comments)    Myalgias    Codeine Itching and Other (See Comments)    Nose itches   Imitrex [Sumatriptan Base] Swelling and Other (See Comments)    Throat swelling   Isosorbide Nitrate Other (See Comments)    headaches   Other Other (See Comments)    Artificial sweeteners cause heart palpitations (stevia is ok)   Quinolones     Patient was warned about not using Cipro and similar antibiotics. Recent studies have raised concern that fluoroquinolone antibiotics could be associated with an increased risk of aortic aneurysm Fluoroquinolones have non-antimicrobial properties that might jeopardise the integrity of the extracellular matrix of the vascular wall In a  propensity score matched cohort study in [place], there was a 66% increased rate of aortic aneurysm or dissection associated with oral fluoroquinolone use, compared wit   Rosuvastatin Other (See Comments)    Restless legs   Simvastatin Other (See Comments)    Myalgias   Social History   Socioeconomic History   Marital status: Married    Spouse name: Not on file   Number of children: 1   Years of education: Not on file   Highest education level: Not on file  Occupational History   Occupation: MECHANICAL DESIGNER    Employer: [employer] resource strain: Not on file   Food insecurity:    Worry: Not on file    Inability: Not on file   Transportation needs:    Medical: Not on file    Non-medical: Not on file  Tobacco Use   Smoking status: Current Every Day Smoker    Packs/day: 0.25    Years: 30.00    Pack years:  7.50    Types: Cigarettes   Smokeless tobacco: Never Used   Tobacco comment: restarted 3 weeks ago  Substance and Sexual Activity   Alcohol use: Yes    Comment: occasional; 1 per day   Drug use: No   Sexual activity: Not on file  Lifestyle   Physical activity:    Days per week: Not on file    Minutes per session: Not on file   Stress: Not on file  Relationships   Social connections:    Talks on phone: Not on file    Gets together: Not on file    Attends religious service: Not on file    Active member of club or organization: Not on file    Attends meetings of clubs or organizations: Not on file    Relationship status: Not on file   Intimate partner violence:    Fear of current or ex partner: Not on file    Emotionally abused: Not on file    Physically abused: Not on file    Forced sexual activity: Not on file  Other Topics Concern   Not on file  Social History Narrative   She is married with 1 son   She is a Tax inspector at a factory   Former smoker   Alcohol is used at least once a week glass of wine   Denies drug use      Review of Systems  All other systems reviewed and are negative.  Objective:   Physical Exam  Constitutional: She is oriented to person, place, and time. She appears well-developed and well-nourished. No distress.  HENT:  Head: Normocephalic and atraumatic.  Right Ear: External ear normal.  Left Ear: External ear normal.  Nose: Nose normal.  Mouth/Throat: Oropharynx is clear and moist. No oropharyngeal exudate.  Eyes: Pupils are equal, round, and reactive to light. Conjunctivae and EOM are normal. Right eye exhibits no discharge. Left eye exhibits no discharge. No scleral icterus.  Neck: Normal range of motion. Neck supple. No JVD present. No tracheal deviation present. No thyromegaly present.  Cardiovascular: Normal rate, regular rhythm and normal heart sounds.  No murmur heard. Pulmonary/Chest: Effort normal and breath  sounds normal. No stridor. No respiratory distress. She has no wheezes. She has no rales. She exhibits no tenderness.  Abdominal: Soft. Bowel sounds are normal. She exhibits no distension and no mass. There is no abdominal tenderness. There is no rebound and no guarding.  Musculoskeletal:        General: Tenderness present. No edema.  Lymphadenopathy:    She has no cervical adenopathy.  Neurological: She is alert and oriented to person, place, and time. She has normal reflexes. She displays normal reflexes. No cranial nerve deficit. She exhibits normal muscle tone. Coordination normal.  Skin: Skin is warm and dry. No rash noted. She is not diaphoretic. No erythema. No pallor.  Psychiatric: She has a normal mood and affect. Her behavior is normal. Judgment and thought content normal.  Vitals reviewed.         Assessment & Plan:  General medical exam  Chronic fatigue  Obstructive sleep apnea treated with continuous positive airway pressure (CPAP)  Polycythemia, secondary - Plan: CBC with Differential/Platelet  Coronary artery disease involving native coronary artery of native heart without angina pectoris  Nonalcoholic steatohepatitis (NASH) - with fibrosis - Plan: COMPLETE METABOLIC PANEL WITH GFR  Focal nodular hyperplasia of liver - Plan: COMPLETE METABOLIC PANEL WITH GFR  Pure hypercholesterolemia - Plan: Lipid panel  Breast cancer screening - Plan: CANCELED: MM Digital Diagnostic Unilat R  Polyarthralgia - Plan: Rheumatoid factor, Sedimentation rate, ANA  Given the polyarthralgia, I will check the patient for rheumatoid arthritis and also check for sedimentation rate to evaluate for PMR and check an ANA to evaluate for possible underlying autoimmune diseases that may explain her fatigue and polyarthralgia.  I encouraged the patient compliance with her CPAP machine given her moderate to severe obstructive sleep apnea.  She wants to discontinue amlodipine due to swelling and  therefore I will add hydrochlorothiazide 25 mg a day to better address her hypertension since she will discontinue amlodipine.  I will check a fasting lipid panel but I strongly encouraged the patient to try to take at least Crestor 5 mg once a week given her underlying history of coronary artery disease.  I believe she can tolerate at least once a week statin and I would gradually increase the frequency of this as tolerated to the highest tolerated dose.  Strongly encouraged the patient to quit smoking due to her risk of coronary artery disease.  The remainder of her cancer screening is up-to-date.  She will get her mammogram this fall at her gynecologist and she will get her colonoscopy this fall.

## 2018-09-11 ENCOUNTER — Encounter: Payer: Self-pay | Admitting: Family Medicine

## 2018-09-11 LAB — COMPLETE METABOLIC PANEL WITH GFR
AG Ratio: 1.7 (calc) (ref 1.0–2.5)
ALT: 69 U/L — ABNORMAL HIGH (ref 6–29)
AST: 43 U/L — ABNORMAL HIGH (ref 10–35)
Albumin: 4.3 g/dL (ref 3.6–5.1)
Alkaline phosphatase (APISO): 77 U/L (ref 37–153)
BUN: 10 mg/dL (ref 7–25)
CO2: 22 mmol/L (ref 20–32)
Calcium: 9.9 mg/dL (ref 8.6–10.4)
Chloride: 105 mmol/L (ref 98–110)
Creat: 0.72 mg/dL (ref 0.50–1.05)
GFR, Est African American: 109 mL/min/{1.73_m2} (ref >=60)
GFR, Est Non African American: 94 mL/min/{1.73_m2} (ref >=60)
Globulin: 2.5 g/dL (calc) (ref 1.9–3.7)
Glucose, Bld: 87 mg/dL (ref 65–99)
Potassium: 5 mmol/L (ref 3.5–5.3)
Sodium: 141 mmol/L (ref 135–146)
Total Bilirubin: 0.6 mg/dL (ref 0.2–1.2)
Total Protein: 6.8 g/dL (ref 6.1–8.1)

## 2018-09-11 LAB — CBC WITH DIFFERENTIAL/PLATELET
Absolute Monocytes: 767 cells/uL (ref 200–950)
Basophils Absolute: 74 cells/uL (ref 0–200)
Basophils Relative: 0.7 %
Eosinophils Absolute: 116 cells/uL (ref 15–500)
Eosinophils Relative: 1.1 %
HCT: 51.2 % — ABNORMAL HIGH (ref 35.0–45.0)
Hemoglobin: 17.3 g/dL — ABNORMAL HIGH (ref 11.7–15.5)
Lymphs Abs: 3717 cells/uL (ref 850–3900)
MCH: 29.5 pg (ref 27.0–33.0)
MCHC: 33.8 g/dL (ref 32.0–36.0)
MCV: 87.4 fL (ref 80.0–100.0)
MPV: 11.3 fL (ref 7.5–12.5)
Monocytes Relative: 7.3 %
Neutro Abs: 5828 cells/uL (ref 1500–7800)
Neutrophils Relative %: 55.5 %
Platelets: 299 10*3/uL (ref 140–400)
RBC: 5.86 10*6/uL — ABNORMAL HIGH (ref 3.80–5.10)
RDW: 14.4 % (ref 11.0–15.0)
Total Lymphocyte: 35.4 %
WBC: 10.5 10*3/uL (ref 3.8–10.8)

## 2018-09-11 LAB — LIPID PANEL
Cholesterol: 292 mg/dL — ABNORMAL HIGH (ref <200)
HDL: 64 mg/dL (ref >=50)
LDL Cholesterol (Calc): 188 mg/dL (calc) — ABNORMAL HIGH
Non-HDL Cholesterol (Calc): 228 mg/dL (calc) — ABNORMAL HIGH (ref <130)
Total CHOL/HDL Ratio: 4.6 (calc) (ref <5.0)
Triglycerides: 212 mg/dL — ABNORMAL HIGH (ref <150)

## 2018-09-11 LAB — RHEUMATOID FACTOR: Rheumatoid fact SerPl-aCnc: <14 IU/mL (ref <14)

## 2018-09-11 LAB — ANA: Anti Nuclear Antibody (ANA): NEGATIVE

## 2018-09-11 LAB — SEDIMENTATION RATE: Sed Rate: 2 mm/h (ref 0–30)

## 2018-09-15 DIAGNOSIS — G4733 Obstructive sleep apnea (adult) (pediatric): Secondary | ICD-10-CM | POA: Diagnosis not present

## 2018-09-24 ENCOUNTER — Encounter: Payer: Self-pay | Admitting: Family Medicine

## 2018-09-25 MED ORDER — DOXAZOSIN MESYLATE 2 MG PO TABS: 2.0000 mg | ORAL_TABLET | Freq: Every day | ORAL | 1 refills | Status: DC

## 2018-09-26 ENCOUNTER — Inpatient Hospital Stay (HOSPITAL_BASED_OUTPATIENT_CLINIC_OR_DEPARTMENT_OTHER): Payer: 59 | Admitting: Oncology

## 2018-09-26 ENCOUNTER — Other Ambulatory Visit: Payer: Self-pay

## 2018-09-26 ENCOUNTER — Inpatient Hospital Stay: Payer: 59

## 2018-09-26 ENCOUNTER — Encounter: Payer: Self-pay | Admitting: Oncology

## 2018-09-26 ENCOUNTER — Inpatient Hospital Stay: Payer: 59 | Attending: Oncology

## 2018-09-26 VITALS — BP 137/97 | HR 89 | Temp 98.3°F | Ht 68.0 in | Wt 177.0 lb

## 2018-09-26 DIAGNOSIS — D751 Secondary polycythemia: Secondary | ICD-10-CM

## 2018-09-26 DIAGNOSIS — Z87891 Personal history of nicotine dependence: Secondary | ICD-10-CM | POA: Diagnosis not present

## 2018-09-26 DIAGNOSIS — I219 Acute myocardial infarction, unspecified: Secondary | ICD-10-CM | POA: Insufficient documentation

## 2018-09-26 DIAGNOSIS — K7460 Unspecified cirrhosis of liver: Secondary | ICD-10-CM | POA: Insufficient documentation

## 2018-09-26 DIAGNOSIS — D497 Neoplasm of unspecified behavior of endocrine glands and other parts of nervous system: Secondary | ICD-10-CM | POA: Insufficient documentation

## 2018-09-26 DIAGNOSIS — Z79899 Other long term (current) drug therapy: Secondary | ICD-10-CM | POA: Insufficient documentation

## 2018-09-26 LAB — CBC
HCT: 52.3 % — ABNORMAL HIGH (ref 36.0–46.0)
Hemoglobin: 17.4 g/dL — ABNORMAL HIGH (ref 12.0–15.0)
MCH: 29.1 pg (ref 26.0–34.0)
MCHC: 33.3 g/dL (ref 30.0–36.0)
MCV: 87.6 fL (ref 80.0–100.0)
Platelets: 279 10*3/uL (ref 150–400)
RBC: 5.97 MIL/uL — ABNORMAL HIGH (ref 3.87–5.11)
RDW: 14.5 % (ref 11.5–15.5)
WBC: 11.2 10*3/uL — ABNORMAL HIGH (ref 4.0–10.5)
nRBC: 0 % (ref 0.0–0.2)

## 2018-09-26 NOTE — Progress Notes (Signed)
Patient is here to follow up on her Polycythemia. Patient stated that she had been doing well with no complaints.

## 2018-09-29 NOTE — Progress Notes (Signed)
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 She has been referred to [place] for evaluation of polycythemia.35 patient has had a chronically elevated hemoglobin and back in 2015 her H&H was 16.2/48.1. Most recently in January 2019 her H&H was 17.1/49.2 then 18.4/53.7 in April 2019. Her main complaint today is excessive fatigue. She reports a slight lacy rash in the medial aspect of her left knee. She denies any blood in her urine. Reports that her appetite is good and she denies any unintentional weight loss. She does not do any anabolic steroids. She reports getting a good night sleep and wakes up refreshed in the morning. She does not remember being told that she snores at night. She does not have any known chronic lung disease.  Results of blood work from 08/22/2017 were as follows: CBC showed white count of 11.5, H&H of 17.9/52.5 with an MCV of 89.6 and a platelet count of 293. E Po level was low at 1.5. Jak 2, CALR and MPL mutation testing was negative. Exon 12 testing was also negative. Urinalysis did not reveal any hematuria. CMP showed elevated AST and ALT of 63 and 107 respectively. carboxyhb levels were elevated at 9  When bone marrow biopsy on 12/13/2017 showed hypercellular bone marrow with trilineage hematopoiesis. There were no morphologic features diagnostic of myeloproliferative neoplasm seen.  Cytogenetic studies were normal  Ct chest/ abdomen did not reveal any malignancy.   Interval history-she continues to smoke.  Has baseline fatigue.  Appetite and weight has been stable.  Abdominal pain and diarrhea has resolved  ECOG PS- 0 Pain scale- 0   Review of systems- Review of Systems  Constitutional: Negative for chills, fever, malaise/fatigue and weight loss.  HENT: Negative for congestion, ear discharge and nosebleeds.   Eyes: Negative for blurred vision.  Respiratory: Negative for cough, hemoptysis, sputum production, shortness of breath and wheezing.   Cardiovascular: Negative for chest pain, palpitations, orthopnea and claudication.  Gastrointestinal: Negative for abdominal pain, blood in stool, constipation, diarrhea, heartburn, melena, nausea and vomiting.  Genitourinary: Negative for dysuria, flank pain, frequency, hematuria and urgency.  Musculoskeletal: Negative for back pain, joint pain and myalgias.  Skin: Negative for rash.  Neurological: Negative for dizziness, tingling, focal weakness, seizures, weakness and headaches.  Endo/Heme/Allergies: Does not bruise/bleed easily.  Psychiatric/Behavioral: Negative for depression and suicidal ideas. The patient does not have insomnia.       Allergies  Allergen Reactions  . Aggrenox [Aspirin-Dipyridamole Er] Tinitus  . Atorvastatin Other (See Comments)    Myalgias   . Codeine Itching and Other (See Comments)    Nose itches  . Imitrex [Sumatriptan Base] Swelling and Other (See Comments)    Throat swelling  . Isosorbide Nitrate Other (See Comments)    headaches  . Other Other (See Comments)    Artificial sweeteners cause heart palpitations (stevia is ok)  . Quinolones     Patient was warned about not using Cipro  and similar antibiotics. Recent studies have raised concern that fluoroquinolone antibiotics could be associated with an increased risk of aortic aneurysm Fluoroquinolones have non-antimicrobial properties  that might jeopardise the integrity of the extracellular matrix of the vascular wall In a  propensity score matched cohort study in [place], there was a 66% increased rate of aortic aneurysm or dissection associated with oral fluoroquinolone use, compared wit  . Rosuvastatin Other (See Comments)    Restless legs  . Simvastatin Other (See Comments)    Myalgias     Past Medical History:  Diagnosis Date  . Adenoma of right adrenal gland    Benign  . Allergy    SEASONAL  . Anemia   . Anxiety   . Arthritis   . Asthma   . Barrett's esophagus 02/09/2014  . CAD (coronary artery disease)    a. prior MI in 2005 with 60-70% dLAD stenosis and 60-70% D2 stenosis (concern for coronary vasospasm)  . Depression   . Focal nodular hyperplasia of liver   . GERD (gastroesophageal reflux disease)   . Heart murmur    AS A CHILD  . History of adenomatous polyp of colon 02/09/2014  . HLD (hyperlipidemia)   . HTN (hypertension)   . Liver hemangioma   . Migraine headache   . Myocardial infarction ([place])   . NASH (nonalcoholic steatohepatitis)    nash  . OSA (obstructive sleep apnea)    ahi 21.7  . Palpitations   . Sinus complaint   . Stroke ([place])   . Unstable angina ([place]) 05/15/2017     Past Surgical History:  Procedure Laterality Date  . ABDOMINOPLASTY    . BLADDER SUSPENSION    . CERVICAL FUSION     C4, 5, 6  . COLONOSCOPY W/ BIOPSIES    . ESOPHAGOGASTRODUODENOSCOPY    . LEFT HEART CATH AND CORONARY ANGIOGRAPHY N/A 05/16/2017   Procedure: LEFT HEART CATH AND CORONARY ANGIOGRAPHY;  Surgeon: Troy Sine, MD;  Location: [facility];  Service: Cardiovascular;  Laterality: N/A;  . TONSILLECTOMY    . TUBAL LIGATION      Social History   Socioeconomic History  . Marital status: Married    Spouse name: Not on file  . Number of children: 1  . Years of education: Not on file  . Highest education level: Not on file  Occupational History  . Occupation: MECHANICAL DESIGNER     Employer: [employer]  Social Needs  . Financial resource strain: Not on file  . Food insecurity:    Worry: Not on file    Inability: Not on file  . Transportation needs:    Medical: Not on file    Non-medical: Not on file  Tobacco Use  . Smoking status: Current Every Day Smoker    Packs/day: 0.25    Years: 30.00    Pack years: 7.50    Types: Cigarettes  . Smokeless tobacco: Never Used  . Tobacco comment: restarted 3 weeks ago  Substance and Sexual Activity  . Alcohol use: Yes    Comment: occasional; 1 per day  . Drug use: No  . Sexual activity: Not on file  Lifestyle  . Physical activity:    Days per week: Not on file    Minutes per session: Not on file  . Stress: Not on file  Relationships  . Social connections:    Talks on phone: Not on file    Gets together: Not on file    Attends religious  service: Not on file    Active member of club or organization: Not on file    Attends meetings of clubs or organizations: Not on file    Relationship status: Not on file  . Intimate partner violence:    Fear of current or ex partner: Not on file    Emotionally abused: Not on file    Physically abused: Not on file    Forced sexual activity: Not on file  Other Topics Concern  . Not on file  Social History Narrative   She is married with 1 son   She is a Tax inspector at a factory   Former smoker   Alcohol is used at least once a week glass of wine   Denies drug use    Family History  Problem Relation Age of Onset  . Coronary artery disease Father   . Aneurysm Father        AAA  . Melanoma Father   . Transient ischemic attack Father   . Heart disease Father   . AAA (abdominal aortic aneurysm) Father   . Aneurysm Mother        AAA  . Colon polyps Mother   . Heart disease Mother   . Irritable bowel syndrome Mother   . Diverticulitis Mother   . Other Mother        Evlyn Clines  . AAA (abdominal aortic aneurysm) Mother   . Brain cancer Mother   . Breast  cancer Paternal Aunt   . Liver disease Maternal Uncle   . Heart disease Brother   . Prostate cancer Paternal Uncle   . Mesothelioma Paternal Uncle   . Colon cancer Neg Hx      Current Outpatient Medications:  .  ALPRAZolam (XANAX) 0.25 MG tablet, TAKE 2 TABLETS (0.5MG) BY MOUTH AT BEDTIME AS NEEDED FOR ANXIETY., Disp: 60 tablet, Rfl: 0 .  aspirin EC 81 MG tablet, Take 81 mg by mouth daily., Disp: , Rfl:  .  benazepril (LOTENSIN) 20 MG tablet, TAKE 1 TABLET BY MOUTH EVERY DAY, Disp: 90 tablet, Rfl: 2 .  butorphanol (STADOL) 10 MG/ML nasal spray, INSTILL 1 SPRAY INTO ONE NOSTRIL ONCE DAILY IF NEEDED, Disp: 2.5 mL, Rfl: 0 .  clopidogrel (PLAVIX) 75 MG tablet, Take 1 tablet (75 mg total) by mouth daily., Disp: 90 tablet, Rfl: 2 .  doxazosin (CARDURA) 2 MG tablet, Take 1 tablet (2 mg total) by mouth daily., Disp: 90 tablet, Rfl: 1 .  hydrochlorothiazide (HYDRODIURIL) 25 MG tablet, Take 1 tablet (25 mg total) by mouth daily. Stop amlodipine, Disp: 90 tablet, Rfl: 3 .  ibuprofen (ADVIL,MOTRIN) 200 MG tablet, Take 400 mg by mouth 2 (two) times daily as needed for headache or moderate pain. , Disp: , Rfl:  .  metoprolol succinate (TOPROL XL) 25 MG 24 hr tablet, Take 0.5 tablets (12.5 mg total) by mouth 2 (two) times daily., Disp: 30 tablet, Rfl: 6 .  nitroGLYCERIN (NITROSTAT) 0.4 MG SL tablet, Place 1 tablet (0.4 mg total) under the tongue every 5 (five) minutes as needed for chest pain., Disp: 25 tablet, Rfl: 3 .  pantoprazole (PROTONIX) 40 MG tablet, TAKE 1 TABLET BY MOUTH EVERY DAY BEFORE BREAKFAST, Disp: 90 tablet, Rfl: 3 .  rosuvastatin (CRESTOR) 5 MG tablet, Take 1 tablet (5 mg total) by mouth daily., Disp: 90 tablet, Rfl: 3 .  HYDROcodone-acetaminophen (NORCO/VICODIN) 5-325 MG tablet, Take 1 tablet by mouth every 6 (six) hours as needed for moderate pain. (Patient not taking: Reported  on 09/26/2018), Disp: 30 tablet, Rfl: 0  Physical exam:  Vitals:   09/26/18 1000  BP: (!) 137/97  Pulse: 89   Temp: 98.3 F (36.8 C)  TempSrc: Tympanic  Weight: 177 lb (80.3 kg)  Height: 5' 8"  (1.727 m)   Physical Exam HENT:     Head: Normocephalic and atraumatic.  Eyes:     Pupils: Pupils are equal, round, and reactive to light.  Neck:     Musculoskeletal: Normal range of motion.  Cardiovascular:     Rate and Rhythm: Normal rate and regular rhythm.     Heart sounds: Normal heart sounds.  Pulmonary:     Effort: Pulmonary effort is normal.     Breath sounds: Normal breath sounds.  Abdominal:     General: Bowel sounds are normal.     Palpations: Abdomen is soft.  Skin:    General: Skin is warm and dry.  Neurological:     Mental Status: She is alert and oriented to person, place, and time.      CMP Latest Ref Rng & Units 09/09/2018  Glucose 65 - 99 mg/dL 87  BUN 7 - 25 mg/dL 10  Creatinine 0.50 - 1.05 mg/dL 0.72  Sodium 135 - 146 mmol/L 141  Potassium 3.5 - 5.3 mmol/L 5.0  Chloride 98 - 110 mmol/L 105  CO2 20 - 32 mmol/L 22  Calcium 8.6 - 10.4 mg/dL 9.9  Total Protein 6.1 - 8.1 g/dL 6.8  Total Bilirubin 0.2 - 1.2 mg/dL 0.6  Alkaline Phos 39 - 117 IU/L -  AST 10 - 35 U/L 43(H)  ALT 6 - 29 U/L 69(H)   CBC Latest Ref Rng & Units 09/26/2018  WBC 4.0 - 10.5 K/uL 11.2(H)  Hemoglobin 12.0 - 15.0 g/dL 17.4(H)  Hematocrit 36.0 - 46.0 % 52.3(H)  Platelets 150 - 400 K/uL 279     Assessment and plan- Patient is a 53 y.o. 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  Goal for phlebotomy at this time is more than 55.  She has had an extensive work-up for polycythemia which was negative for any primary cause and I suspect that her secondary polycythemia is due to smoking.  Patient is not interested in quitting at this time.  Repeat CBC in 3 months in 6 months and I will see her back in 6 months.  Phlebotomy if hematocrit greater than 55. Visit Diagnosis 1. Polycythemia, secondary      Dr. Randa Evens, MD, MPH [facility] at [facility] [phone] 09/29/2018 10:48 AM

## 2018-10-06 ENCOUNTER — Encounter: Payer: Self-pay | Admitting: Family Medicine

## 2018-10-13 ENCOUNTER — Emergency Department (HOSPITAL_COMMUNITY): Payer: BC Managed Care – PPO

## 2018-10-13 ENCOUNTER — Encounter: Payer: Self-pay | Admitting: Family Medicine

## 2018-10-13 ENCOUNTER — Encounter (HOSPITAL_COMMUNITY): Payer: Self-pay | Admitting: Radiology

## 2018-10-13 ENCOUNTER — Other Ambulatory Visit: Payer: Self-pay

## 2018-10-13 ENCOUNTER — Observation Stay (HOSPITAL_COMMUNITY): Payer: BC Managed Care – PPO

## 2018-10-13 ENCOUNTER — Observation Stay (HOSPITAL_COMMUNITY)
Admission: EM | Admit: 2018-10-13 | Discharge: 2018-10-14 | Disposition: A | Discharge status: Home / Self Care | Payer: BC Managed Care – PPO | Attending: Internal Medicine | Admitting: Internal Medicine

## 2018-10-13 DIAGNOSIS — R200 Anesthesia of skin: Secondary | ICD-10-CM

## 2018-10-13 DIAGNOSIS — Z03818 Encounter for observation for suspected exposure to other biological agents ruled out: Secondary | ICD-10-CM | POA: Insufficient documentation

## 2018-10-13 DIAGNOSIS — G4733 Obstructive sleep apnea (adult) (pediatric): Secondary | ICD-10-CM | POA: Diagnosis present

## 2018-10-13 DIAGNOSIS — F419 Anxiety disorder, unspecified: Secondary | ICD-10-CM | POA: Diagnosis not present

## 2018-10-13 DIAGNOSIS — G43109 Migraine with aura, not intractable, without status migrainosus: Secondary | ICD-10-CM

## 2018-10-13 DIAGNOSIS — I2510 Atherosclerotic heart disease of native coronary artery without angina pectoris: Secondary | ICD-10-CM | POA: Diagnosis not present

## 2018-10-13 DIAGNOSIS — Z79899 Other long term (current) drug therapy: Secondary | ICD-10-CM | POA: Insufficient documentation

## 2018-10-13 DIAGNOSIS — K7581 Nonalcoholic steatohepatitis (NASH): Secondary | ICD-10-CM | POA: Diagnosis present

## 2018-10-13 DIAGNOSIS — I10 Essential (primary) hypertension: Secondary | ICD-10-CM | POA: Diagnosis not present

## 2018-10-13 DIAGNOSIS — G459 Transient cerebral ischemic attack, unspecified: Secondary | ICD-10-CM | POA: Diagnosis not present

## 2018-10-13 DIAGNOSIS — J45909 Unspecified asthma, uncomplicated: Secondary | ICD-10-CM | POA: Diagnosis not present

## 2018-10-13 DIAGNOSIS — F17210 Nicotine dependence, cigarettes, uncomplicated: Secondary | ICD-10-CM | POA: Insufficient documentation

## 2018-10-13 DIAGNOSIS — Z7982 Long term (current) use of aspirin: Secondary | ICD-10-CM | POA: Diagnosis not present

## 2018-10-13 LAB — COMPREHENSIVE METABOLIC PANEL
ALT: 92 U/L — ABNORMAL HIGH (ref 0–44)
AST: 66 U/L — ABNORMAL HIGH (ref 15–41)
Albumin: 4 g/dL (ref 3.5–5.0)
Alkaline Phosphatase: 74 U/L (ref 38–126)
Anion gap: 13 (ref 5–15)
BUN: 7 mg/dL (ref 6–20)
CO2: 25 mmol/L (ref 22–32)
Calcium: 10.1 mg/dL (ref 8.9–10.3)
Chloride: 101 mmol/L (ref 98–111)
Creatinine, Ser: 0.7 mg/dL (ref 0.44–1.00)
GFR calc Af Amer: >60 mL/min (ref >60)
GFR calc non Af Amer: >60 mL/min (ref >60)
Glucose, Bld: 110 mg/dL — ABNORMAL HIGH (ref 70–99)
Potassium: 4 mmol/L (ref 3.5–5.1)
Sodium: 139 mmol/L (ref 135–145)
Total Bilirubin: 0.9 mg/dL (ref 0.3–1.2)
Total Protein: 7.2 g/dL (ref 6.5–8.1)

## 2018-10-13 LAB — I-STAT CHEM 8, ED
BUN: 8 mg/dL (ref 6–20)
Calcium, Ion: 1.15 mmol/L (ref 1.15–1.40)
Chloride: 102 mmol/L (ref 98–111)
Creatinine, Ser: 0.7 mg/dL (ref 0.44–1.00)
Glucose, Bld: 107 mg/dL — ABNORMAL HIGH (ref 70–99)
HCT: 55 % — ABNORMAL HIGH (ref 36.0–46.0)
Hemoglobin: 18.7 g/dL — ABNORMAL HIGH (ref 12.0–15.0)
Potassium: 4.4 mmol/L (ref 3.5–5.1)
Sodium: 138 mmol/L (ref 135–145)
TCO2: 28 mmol/L (ref 22–32)

## 2018-10-13 LAB — DIFFERENTIAL
Abs Immature Granulocytes: 0.02 10*3/uL (ref 0.00–0.07)
Basophils Absolute: 0.1 10*3/uL (ref 0.0–0.1)
Basophils Relative: 1 %
Eosinophils Absolute: 0.1 10*3/uL (ref 0.0–0.5)
Eosinophils Relative: 1 %
Immature Granulocytes: 0 %
Lymphocytes Relative: 35 %
Lymphs Abs: 3.2 10*3/uL (ref 0.7–4.0)
Monocytes Absolute: 0.7 10*3/uL (ref 0.1–1.0)
Monocytes Relative: 8 %
Neutro Abs: 5.1 10*3/uL (ref 1.7–7.7)
Neutrophils Relative %: 55 %

## 2018-10-13 LAB — CBC
HCT: 54.5 % — ABNORMAL HIGH (ref 36.0–46.0)
Hemoglobin: 17.8 g/dL — ABNORMAL HIGH (ref 12.0–15.0)
MCH: 29.4 pg (ref 26.0–34.0)
MCHC: 32.7 g/dL (ref 30.0–36.0)
MCV: 90.1 fL (ref 80.0–100.0)
Platelets: 297 10*3/uL (ref 150–400)
RBC: 6.05 MIL/uL — ABNORMAL HIGH (ref 3.87–5.11)
RDW: 15 % (ref 11.5–15.5)
WBC: 9.2 10*3/uL (ref 4.0–10.5)
nRBC: 0 % (ref 0.0–0.2)

## 2018-10-13 LAB — I-STAT BETA HCG BLOOD, ED (MC, WL, AP ONLY): I-stat hCG, quantitative: <5 m[IU]/mL (ref <5)

## 2018-10-13 LAB — SARS CORONAVIRUS 2: SARS Coronavirus 2: NOT DETECTED

## 2018-10-13 LAB — PROTIME-INR
INR: 1 (ref 0.8–1.2)
Prothrombin Time: 12.7 seconds (ref 11.4–15.2)

## 2018-10-13 LAB — CBG MONITORING, ED: Glucose-Capillary: 103 mg/dL — ABNORMAL HIGH (ref 70–99)

## 2018-10-13 LAB — APTT: aPTT: 28 seconds (ref 24–36)

## 2018-10-13 MED ORDER — ACETAMINOPHEN 160 MG/5ML PO SOLN: 650.0000 mg | ORAL | Status: DC | PRN

## 2018-10-13 MED ORDER — ASPIRIN EC 81 MG PO TBEC
81.0000 mg | DELAYED_RELEASE_TABLET | Freq: Every day | ORAL | Status: DC
  Administered 2018-10-14: 08:00:00 81 mg via ORAL
  Filled 2018-10-13: qty 1

## 2018-10-13 MED ORDER — ALPRAZOLAM 0.5 MG PO TABS: 0.5000 mg | ORAL_TABLET | Freq: Every evening | ORAL | Status: DC | PRN

## 2018-10-13 MED ORDER — ACETAMINOPHEN 325 MG PO TABS: 650.0000 mg | ORAL_TABLET | ORAL | Status: DC | PRN

## 2018-10-13 MED ORDER — BENAZEPRIL HCL 20 MG PO TABS
20.0000 mg | ORAL_TABLET | Freq: Every day | ORAL | Status: DC
  Administered 2018-10-14: 08:00:00 20 mg via ORAL
  Filled 2018-10-13: qty 1

## 2018-10-13 MED ORDER — IOHEXOL 350 MG/ML SOLN
50.0000 mL | Freq: Once | INTRAVENOUS | Status: AC | PRN
  Administered 2018-10-13: 13:00:00 50 mL via INTRAVENOUS

## 2018-10-13 MED ORDER — ENOXAPARIN SODIUM 40 MG/0.4ML ~~LOC~~ SOLN
40.0000 mg | SUBCUTANEOUS | Status: DC
  Administered 2018-10-13: 40 mg via SUBCUTANEOUS
  Filled 2018-10-13: qty 0.4

## 2018-10-13 MED ORDER — SODIUM CHLORIDE 0.9% FLUSH: 3.0000 mL | Freq: Once | INTRAVENOUS | Status: DC

## 2018-10-13 MED ORDER — HYDROCODONE-ACETAMINOPHEN 5-325 MG PO TABS: 1.0000 | ORAL_TABLET | Freq: Four times a day (QID) | ORAL | Status: DC | PRN

## 2018-10-13 MED ORDER — PANTOPRAZOLE SODIUM 40 MG PO TBEC
40.0000 mg | DELAYED_RELEASE_TABLET | Freq: Every day | ORAL | Status: DC
  Administered 2018-10-14: 08:00:00 40 mg via ORAL
  Filled 2018-10-13: qty 1

## 2018-10-13 MED ORDER — SODIUM CHLORIDE 0.9 % IV SOLN
INTRAVENOUS | Status: AC
  Administered 2018-10-13: 19:00:00 via INTRAVENOUS

## 2018-10-13 MED ORDER — METOPROLOL SUCCINATE ER 25 MG PO TB24
12.5000 mg | ORAL_TABLET | Freq: Two times a day (BID) | ORAL | Status: DC
  Administered 2018-10-14: 08:00:00 12.5 mg via ORAL
  Filled 2018-10-13: qty 1

## 2018-10-13 MED ORDER — ACETAMINOPHEN 650 MG RE SUPP: 650.0000 mg | RECTAL | Status: DC | PRN

## 2018-10-13 MED ORDER — STROKE: EARLY STAGES OF RECOVERY BOOK
Freq: Once | Status: AC
  Administered 2018-10-13: 19:00:00
  Filled 2018-10-13: qty 1

## 2018-10-13 MED ORDER — SENNOSIDES-DOCUSATE SODIUM 8.6-50 MG PO TABS: 1.0000 | ORAL_TABLET | Freq: Every evening | ORAL | Status: DC | PRN

## 2018-10-13 MED ORDER — CLOPIDOGREL BISULFATE 75 MG PO TABS
75.0000 mg | ORAL_TABLET | Freq: Every day | ORAL | Status: DC
  Administered 2018-10-14: 08:00:00 75 mg via ORAL
  Filled 2018-10-13: qty 1

## 2018-10-13 NOTE — ED Triage Notes (Signed)
Pt from home with complaint of left arm, finger, facial tingling/numbness. The left hand tingling started upon waking this morning at 0830. The facial tingling/numbness started soon thereafter. LSN last night at 2130 when the pt went to bed. No weakness, speech difficulty, facial assymetry, mental status changes.

## 2018-10-13 NOTE — ED Notes (Signed)
ED TO INPATIENT HANDOFF REPORT  ED Nurse Name and Phone #: Percell Locus, RN  S Name/Age/Gender Janet [name] 53 y.o. female Room/Bed: 027C/027C  Code Status   Code Status: Prior  Home/SNF/Other Home Patient oriented to: self, place, time and situation Is this baseline? Yes   Triage Complete: Triage complete  Chief Complaint stroke like sx  Triage Note Pt from home with complaint of left arm, finger, facial tingling/numbness. The left hand tingling started upon waking this morning at 0830. The facial tingling/numbness started soon thereafter. LSN last night at 2130 when the pt went to bed. No weakness, speech difficulty, facial assymetry, mental status changes.    Allergies Allergies  Allergen Reactions  . Aggrenox [Aspirin-Dipyridamole Er] Tinitus  . Atorvastatin Other (See Comments)    Myalgias   . Codeine Itching and Other (See Comments)    Nose itches  . Imitrex [Sumatriptan Base] Swelling and Other (See Comments)    Throat swelling  . Isosorbide Nitrate Other (See Comments)    headaches  . Other Other (See Comments)    Artificial sweeteners cause heart palpitations (stevia is ok)  . Quinolones     Patient was warned about not using Cipro and similar antibiotics. Recent studies have raised concern that fluoroquinolone antibiotics could be associated with an increased risk of aortic aneurysm Fluoroquinolones have non-antimicrobial properties that might jeopardise the integrity of the extracellular matrix of the vascular wall In a  propensity score matched cohort study in [place], there was a 66% increased rate of aortic aneurysm or dissection associated with oral fluoroquinolone use, compared wit  . Rosuvastatin Other (See Comments)    Restless legs  . Simvastatin Other (See Comments)    Myalgias    Level of Care/Admitting Diagnosis ED Disposition    ED Disposition Condition [facility] Area: [place] [[id number]]  Level of Care:  Telemetry Medical [104]  I expect the patient will be discharged within 24 hours: Yes  LOW acuity---Tx typically complete <24 hrs---ACUTE conditions typically can be evaluated <24 hours---LABS likely to return to acceptable levels <24 hours---IS near functional baseline---EXPECTED to return to current living arrangement---NOT newly hypoxic: Meets criteria for 5C-Observation unit  Covid Evaluation: Confirmed COVID Negative  Diagnosis: TIA (transient ischemic attack) [962952]  Admitting Physician: Vashti Hey [[id number]]  Attending Physician: Vashti Hey [[id number]]  PT Class (Do Not Modify): Observation [104]  PT Acc Code (Do Not Modify): Observation [10022]       B Medical/Surgery History Past Medical History:  Diagnosis Date  . Adenoma of right adrenal gland    Benign  . Allergy    SEASONAL  . Anemia   . Anxiety   . Arthritis   . Asthma   . Barrett's esophagus 02/09/2014  . CAD (coronary artery disease)    a. prior MI in 2005 with 60-70% dLAD stenosis and 60-70% D2 stenosis (concern for coronary vasospasm)  . Depression   . Focal nodular hyperplasia of liver   . GERD (gastroesophageal reflux disease)   . Heart murmur    AS A CHILD  . History of adenomatous polyp of colon 02/09/2014  . HLD (hyperlipidemia)   . HTN (hypertension)   . Liver hemangioma   . Migraine headache   . Myocardial infarction ([place])   . NASH (nonalcoholic steatohepatitis)    nash  . OSA (obstructive sleep apnea)    ahi 21.7  . Palpitations   . Sinus complaint   . Stroke ([place])   .  Unstable angina ([place]) 05/15/2017   Past Surgical History:  Procedure Laterality Date  . ABDOMINOPLASTY    . BLADDER SUSPENSION    . CERVICAL FUSION     C4, 5, 6  . COLONOSCOPY W/ BIOPSIES    . ESOPHAGOGASTRODUODENOSCOPY    . LEFT HEART CATH AND CORONARY ANGIOGRAPHY N/A 05/16/2017   Procedure: LEFT HEART CATH AND CORONARY ANGIOGRAPHY;  Surgeon: Troy Sine, MD;  Location: [facility];  Service: Cardiovascular;  Laterality: N/A;  . TONSILLECTOMY    . TUBAL LIGATION       A IV Location/Drains/Wounds Patient Lines/Drains/Airways Status   Active Line/Drains/Airways    Name:   Placement date:   Placement time:   Site:   Days:   Peripheral IV 11/27/14 Left Antecubital   11/27/14    1539    Antecubital   1416   Peripheral IV 12/27/17 Left Antecubital   12/27/17    1430    Antecubital   290   Peripheral IV 01/15/18 Anterior;Proximal;Right Forearm   01/15/18    1030    Forearm   271   Peripheral IV 01/21/18 Left Antecubital   01/21/18    1430    Antecubital   265   Peripheral IV 03/04/18 Left Antecubital   03/04/18    1358    Antecubital   223   Peripheral IV 03/18/18 Left Antecubital   03/18/18    1334    Antecubital   209   Peripheral IV 10/13/18 Left Antecubital   10/13/18    1256    Antecubital   less than 1   Sheath 05/16/17 Right Radial   05/16/17    1517    Radial   515   Incision (Closed) 12/25/13 Abdomen Lateral;Mid   12/25/13    1350     1753          Intake/Output Last 24 hours No intake or output data in the 24 hours ending 10/13/18 1447  Labs/Imaging Results for orders placed or performed during the hospital encounter of 10/13/18 (from the past 48 hour(s))  CBG monitoring, ED     Status: Abnormal   Collection Time: 10/13/18 12:47 PM  Result Value Ref Range   Glucose-Capillary 103 (H) 70 - 99 mg/dL  I-Stat beta hCG blood, ED     Status: None   Collection Time: 10/13/18 12:52 PM  Result Value Ref Range   I-stat hCG, quantitative <5.0 <5 mIU/mL   Comment 3            Comment:   GEST. AGE      CONC.  (mIU/mL)   <=1 WEEK        5 - 50     2 WEEKS       50 - 500     3 WEEKS       100 - 10,000     4 WEEKS     1,000 - 30,000        FEMALE AND NON-PREGNANT FEMALE:     LESS THAN 5 mIU/mL   I-stat chem 8, ED     Status: Abnormal   Collection Time: 10/13/18 12:54 PM  Result Value Ref Range   Sodium 138 135 - 145 mmol/L   Potassium 4.4 3.5 - 5.1 mmol/L    Chloride 102 98 - 111 mmol/L   BUN 8 6 - 20 mg/dL   Creatinine, Ser 0.70 0.44 - 1.00 mg/dL   Glucose, Bld 107 (H) 70 -  99 mg/dL   Calcium, Ion 1.15 1.15 - 1.40 mmol/L   TCO2 28 22 - 32 mmol/L   Hemoglobin 18.7 (H) 12.0 - 15.0 g/dL   HCT 55.0 (H) 36.0 - 46.0 %  Protime-INR     Status: None   Collection Time: 10/13/18 12:59 PM  Result Value Ref Range   Prothrombin Time 12.7 11.4 - 15.2 seconds   INR 1.0 0.8 - 1.2    Comment: (NOTE) INR goal varies based on device and disease states. Performed at [facility], [place] [address], [place], [place] [address]   APTT     Status: None   Collection Time: 10/13/18 12:59 PM  Result Value Ref Range   aPTT 28 24 - 36 seconds    Comment: Performed at [facility] [address] [place], [place] [address]  CBC     Status: Abnormal   Collection Time: 10/13/18 12:59 PM  Result Value Ref Range   WBC 9.2 4.0 - 10.5 K/uL   RBC 6.05 (H) 3.87 - 5.11 MIL/uL   Hemoglobin 17.8 (H) 12.0 - 15.0 g/dL   HCT 54.5 (H) 36.0 - 46.0 %   MCV 90.1 80.0 - 100.0 fL   MCH 29.4 26.0 - 34.0 pg   MCHC 32.7 30.0 - 36.0 g/dL   RDW 15.0 11.5 - 15.5 %   Platelets 297 150 - 400 K/uL   nRBC 0.0 0.0 - 0.2 %    Comment: Performed at [facility], [place] [address] [place], [place] [address]  Differential     Status: None   Collection Time: 10/13/18 12:59 PM  Result Value Ref Range   Neutrophils Relative % 55 %   Neutro Abs 5.1 1.7 - 7.7 K/uL   Lymphocytes Relative 35 %   Lymphs Abs 3.2 0.7 - 4.0 K/uL   Monocytes Relative 8 %   Monocytes Absolute 0.7 0.1 - 1.0 K/uL   Eosinophils Relative 1 %   Eosinophils Absolute 0.1 0.0 - 0.5 K/uL   Basophils Relative 1 %   Basophils Absolute 0.1 0.0 - 0.1 K/uL   Immature Granulocytes 0 %   Abs Immature Granulocytes 0.02 0.00 - 0.07 K/uL    Comment: Performed at [facility], [place] [address], [place],  [address]  Comprehensive metabolic panel     Status: Abnormal   Collection Time: 10/13/18 12:59 PM   Result Value Ref Range   Sodium 139 135 - 145 mmol/L   Potassium 4.0 3.5 - 5.1 mmol/L   Chloride 101 98 - 111 mmol/L   CO2 25 22 - 32 mmol/L   Glucose, Bld 110 (H) 70 - 99 mg/dL   BUN 7 6 - 20 mg/dL   Creatinine, Ser 0.70 0.44 - 1.00 mg/dL   Calcium 10.1 8.9 - 10.3 mg/dL   Total Protein 7.2 6.5 - 8.1 g/dL   Albumin 4.0 3.5 - 5.0 g/dL   AST 66 (H) 15 - 41 U/L   ALT 92 (H) 0 - 44 U/L   Alkaline Phosphatase 74 38 - 126 U/L   Total Bilirubin 0.9 0.3 - 1.2 mg/dL   GFR calc non Af Amer >60 >60 mL/min   GFR calc Af Amer >60 >60 mL/min   Anion gap 13 5 - 15    Comment: Performed at [facility] [address], [place], [place] [address]   Ct Angio Head W Or Wo Contrast  Result Date: 10/13/2018 CLINICAL DATA:  Code stroke.  Left arm weakness and tingling EXAM: CT ANGIOGRAPHY HEAD AND NECK TECHNIQUE: Multidetector CT imaging of the head and neck was performed using the standard protocol during bolus administration of intravenous contrast. Multiplanar CT image reconstructions and MIPs were obtained to evaluate the vascular anatomy. Carotid stenosis measurements (when applicable) are obtained utilizing NASCET criteria, using the distal internal carotid diameter as the denominator. CONTRAST:  20m OMNIPAQUE IOHEXOL 350 MG/ML SOLN COMPARISON:  CTA neck 07/19/2016.  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 This appears to originate from the origin of the left common carotid artery and now measures 10 x 10 x 10 mm. There is been partial thrombosis of the apex of the aneurysm. The innominate artery is widely patent with mild atherosclerotic disease. Left common carotid artery with mild narrowing proximally due to the adjacent aneurysm but otherwise clear of significant stenosis. Left subclavian artery patent. Right carotid system: Mild atherosclerotic disease in the right carotid  bulb without significant stenosis. Left carotid system: Mild atherosclerotic calcification left carotid bulb without stenosis. Vertebral arteries: Both vertebral arteries widely patent to the basilar without significant stenosis. Mild atherosclerotic calcification distal left vertebral artery. Left vertebral artery dominant. Skeleton: ACDF with solid fusion C5 through C7. Disc degeneration and spurring C3-4 and C4-5. No acute skeletal abnormality. Other neck: Negative for mass or adenopathy. Upper chest: Negative Review of the MIP images confirms the above findings CTA HEAD FINDINGS Anterior circulation: Cavernous carotid widely patent bilaterally. Anterior and middle cerebral arteries widely patent bilaterally without stenosis or occlusion. Posterior circulation: Both vertebral arteries patent to the basilar without significant stenosis. PICA patent bilaterally. Basilar widely patent. Superior cerebellar and posterior cerebral arteries patent bilaterally without stenosis. Venous sinuses: Limited evaluation due to arterial phase scanning. Minimal contrast in the veins. Anatomic variants: None Delayed phase: Not perform Review of the MIP images confirms the above findings IMPRESSION: 1. Negative for emergent large vessel occlusion. No significant intracranial stenosis 2. Mild atherosclerotic disease carotid bulb bilaterally without significant stenosis 3. 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 This is not appear to be likely source of emboli to the left common carotid artery Electronically Signed   By: CFranchot GalloM.D.   On: 10/13/2018 13:28   Ct Angio Neck W Or Wo Contrast  Result Date: 10/13/2018 CLINICAL DATA:  Code stroke.  Left arm weakness and tingling EXAM: CT ANGIOGRAPHY HEAD AND NECK TECHNIQUE: Multidetector  CT imaging of the head and neck was performed using the standard protocol during bolus administration of intravenous contrast. Multiplanar CT image reconstructions and MIPs were obtained to evaluate the vascular anatomy. Carotid stenosis measurements (when applicable) are obtained utilizing NASCET criteria, using the distal internal carotid diameter as the denominator. CONTRAST:  532mOMNIPAQUE IOHEXOL 350 MG/ML SOLN COMPARISON:  CTA neck 07/19/2016.  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 This appears to originate from the origin of the left common carotid artery and now measures 10 x 10 x 10 mm. There is been partial thrombosis of the apex of the aneurysm. The innominate artery is widely patent with mild atherosclerotic disease. Left  common carotid artery with mild narrowing proximally due to the adjacent aneurysm but otherwise clear of significant stenosis. Left subclavian artery patent. Right carotid system: Mild atherosclerotic disease in the right carotid bulb without significant stenosis. Left carotid system: Mild atherosclerotic calcification left carotid bulb without stenosis. Vertebral arteries: Both vertebral arteries widely patent to the basilar without significant stenosis. Mild atherosclerotic calcification distal left vertebral artery. Left vertebral artery dominant. Skeleton: ACDF with solid fusion C5 through C7. Disc degeneration and spurring C3-4 and C4-5. No acute skeletal abnormality. Other neck: Negative for mass or adenopathy. Upper chest: Negative Review of the MIP images confirms the above findings CTA HEAD FINDINGS Anterior circulation: Cavernous carotid widely patent bilaterally. Anterior and middle cerebral arteries widely patent bilaterally without stenosis or occlusion. Posterior circulation: Both vertebral arteries patent to the basilar without significant  stenosis. PICA patent bilaterally. Basilar widely patent. Superior cerebellar and posterior cerebral arteries patent bilaterally without stenosis. Venous sinuses: Limited evaluation due to arterial phase scanning. Minimal contrast in the veins. Anatomic variants: None Delayed phase: Not perform Review of the MIP images confirms the above findings IMPRESSION: 1. Negative for emergent large vessel occlusion. No significant intracranial stenosis 2. Mild atherosclerotic disease carotid bulb bilaterally without significant stenosis 3. 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 This is not appear to be likely source of emboli to the left common carotid artery Electronically Signed   By: Franchot Gallo M.D.   On: 10/13/2018 13:28   Ct Head Code Stroke Wo Contrast  Result Date: 10/13/2018 CLINICAL DATA:  Code stroke.  Left arm weakness and tingling EXAM: CT HEAD WITHOUT CONTRAST TECHNIQUE: Contiguous axial images were obtained from the base of the skull through the vertex without intravenous contrast. COMPARISON:  MRI head 02/17/2016 FINDINGS: Brain: Ventricle size normal. Negative for hemorrhage. Scattered small white matter hypodensities bilaterally. Small chronic infarct in the cerebellum bilaterally. No acute cortical infarct or mass. No midline shift. Vascular: Diffusely hyperdense vessels most likely due to blood pool effect and high hematocrit. Skull: Negative Sinuses/Orbits: Small air-fluid level sphenoid sinus. Remaining sinuses clear. Normal orbit. Other: None ASPECTS ([place] Stroke Program Early CT Score) - Ganglionic level infarction (caudate, lentiform nuclei, internal capsule, insula, M1-M3 cortex): 7 - Supraganglionic infarction (M4-M6 cortex): 3 Total score (0-10 with 10 being normal): 10 IMPRESSION: 1. No acute  intracranial abnormality 2. Scattered areas of ischemia in the white matter appear chronic. Small chronic infarcts in the white matter bilaterally. 3. ASPECTS is 10 4. These results were called by telephone at the time of interpretation on 10/13/2018 at 1:05 pm to Dr. Rory Percy , who verbally acknowledged these results. Electronically Signed   By: Franchot Gallo M.D.   On: 10/13/2018 13:06    Pending Labs Unresulted Labs (From admission, onward)    Start     Ordered   10/13/18 1328  SARS Coronavirus 2 (CEPHEID - Performed in [place] hospital lab), Hosp Order  (Asymptomatic Patients Labs)  Once,   STAT    Question:  Rule Out  Answer:  Yes   10/13/18 1327          Vitals/Pain Today's Vitals   10/13/18 1330 10/13/18 1341 10/13/18 1400 10/13/18 1430  BP: (!) 141/101  (!) 155/113 (!) 165/119  Pulse: 63  (!) 56 (!) 55  Resp: 18  17 19   Temp:      TempSrc:      SpO2: 94%  98% 96%  PainSc:  0-No pain      Isolation Precautions No active isolations  Medications Medications  sodium chloride flush (NS) 0.9 % injection 3 mL (has no administration in time range)  iohexol (OMNIPAQUE) 350 MG/ML injection 50 mL (50 mLs Intravenous Contrast Given 10/13/18 1258)    Mobility walks Low fall risk   Focused Assessments Neuro Assessment Handoff:  Swallow screen pass? Yes  Cardiac Rhythm: Normal sinus rhythm NIH Stroke Scale ( + Modified Stroke Scale Criteria)  Interval: Shift assessment Level of Consciousness (1a.)   : Alert, keenly responsive LOC Questions (1b. )   +: Answers both questions correctly LOC Commands (1c. )   + : Performs both tasks correctly Best Gaze (2. )  +: Normal Visual (3. )  +: No visual loss Facial Palsy (4. )    : Normal symmetrical movements Motor Arm, Left (5a. )   +: No drift Motor Arm, Right (5b. )   +: No drift Motor Leg, Left (6a. )   +: No drift Motor Leg, Right (6b. )   +: No drift Limb Ataxia (7. ): Absent Sensory (8. )   +: Normal, no sensory  loss Best Language (9. )   +: No aphasia Dysarthria (10. ): Normal Extinction/Inattention (11.)   +: No Abnormality Modified SS Total  +: 0 Complete NIHSS TOTAL: 0 Last date known well: 10/12/18 Last time known well: 2130 Neuro Assessment: Within Defined Limits Neuro Checks:   Initial (10/13/18 1250)  Last Documented NIHSS Modified Score: 0 (10/13/18 1400) Has TPA been given? No If patient is a Neuro Trauma and patient is going to OR before floor call report to [place] nurse: [phone] or [phone]     R Recommendations: See Admitting Provider Note  Report given to:   Additional Notes:

## 2018-10-13 NOTE — ED Notes (Addendum)
Code stroke cancelled Per Dr Carloyn Jaeger. Pt's sx have completely resolved. Pt will be admitted with need of echo and MRI. Pt aware of this.

## 2018-10-13 NOTE — Telephone Encounter (Signed)
Call placed to patient.   Advised that with her Sx and her Hx, she needs to go to ER for eval.   Verbalized understanding.

## 2018-10-13 NOTE — Code Documentation (Signed)
53yo female arriving to [facility] via private vehicle at 1236. Patient from home where she woke up with LUE tingling around 0830. She describes tingling to her fingers. She also noted left perioral and tongue numbness and tingling after waking. LKW 2130 on 10/12/2018. Patient with h/o TIA, HTN and MI. Code stroke called on patient arrival. Stroke team met the patient at the bridge. Patient transported to CT. CT completed followed by CTA head and neck. NIHSS 0, see documentation for details and code stroke times. Patient now reporting numbness and tingling has mostly resolved. Patient is outside the window for treatment with tPA. No acute stroke treatment at this time. Code stroke canceled. Bedside handoff with ED RN Lennette Bihari.

## 2018-10-13 NOTE — H&P (Addendum)
History and Physical:    Janet [name]   NTI:[id number] DOB: [birth year] DOA: 10/13/2018  Referring MD/provider: Dr. Vanita Panda PCP: Susy Frizzle, MD   Patient coming from: Home  Chief Complaint: Numbness and tingling of left hand and left side of lips  History of Present Illness:   Janet [name] is an 53 y.o. female with past medical history significant for hypertension, anxiety, coronary artery disease with MI at age 66 per patient report, history of TIA 2 years ago who was in her usual state of health until this morning when she woke with numbness and tingling of her left hand.  Patient states that her whole hand up to her wrist felt tingly and numb.  She initially thought that she had possibly pinched a nerve in her neck which she had done before however about an hour later she developed numbness and tingling on the left side of her upper and lower lips.  Patient states that she had had a TIA 2 years ago which should presented similarly with numbness and tingling on the left side of her face and lips.  Patient decided to come to the ED for evaluation.  On route to the ED patient states that her hand symptoms resolved and by the time she was being evaluated her facial symptoms had all resolved.  At present patient states that she feels at baseline.  Review of systems is associated with a sensation of "feeling swimmy headed and not quite right" while she was having the symptoms of numbness in her hands and left side of her lips.  This sensation has now resolved.  Patient denies any fevers or chills.  Denies any headache or migraines.  No photophobia no tinnitus.  Denies any sensation of clumsiness.  Able to move all of her extremities without difficulty.  Patient denies confusion or difficulty with comprehension or speech.  Specifically patient denies any chest pain shortness of breath or palpitations.  No exposure to COVID.  ED Course:  The patient underwent a CT scan of the  brain which showed no bleed.  CTA of the head and neck were also negative for intrarenal lesion.  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  Patient was seen by neurology who no differential diagnosis is stroke versus complex migraine.  They are recommending admission for TIA work-up and patient is agreeable to this.  ROS:   ROS   Review of Systems: General: No fever, chills, weight changes Skin: No rashes, lesions, wounds Eyes: no discharge, redness, pain Endocrine: no heat/cold intolerance, no polyuria Respiratory: No cough,, shortness of breath, hemoptysis Cardiovascular: No palpitations, chest pain GI: No nausea, vomiting, diarrhea, constipation GU: No dysuria, increased frequency   Past Medical History:   Past Medical History:  Diagnosis Date   Adenoma of right adrenal gland    Benign   Allergy    SEASONAL   Anemia    Anxiety    Arthritis    Asthma    Barrett's esophagus 02/09/2014   CAD (coronary artery disease)    a. prior MI in 2005 with 60-70% dLAD stenosis and 60-70% D2 stenosis (concern for coronary vasospasm)   Depression    Focal nodular hyperplasia of liver    GERD (gastroesophageal reflux disease)    Heart murmur    AS A CHILD   History of adenomatous polyp of colon 02/09/2014   HLD (hyperlipidemia)    HTN (hypertension)  Liver hemangioma    Migraine headache    Myocardial infarction (HCC)    NASH (nonalcoholic steatohepatitis)    nash   OSA (obstructive sleep apnea)    ahi 21.7   Palpitations    Sinus complaint    Stroke [facility])    Unstable angina ([place]) 05/15/2017    Past Surgical History:   Past Surgical History:  Procedure Laterality Date   ABDOMINOPLASTY     BLADDER SUSPENSION     CERVICAL FUSION     C4, 5, 6   COLONOSCOPY W/ BIOPSIES     ESOPHAGOGASTRODUODENOSCOPY     LEFT HEART CATH AND CORONARY ANGIOGRAPHY N/A 05/16/2017   Procedure: LEFT HEART CATH  AND CORONARY ANGIOGRAPHY;  Surgeon: Troy Sine, MD;  Location: [facility];  Service: Cardiovascular;  Laterality: N/A;   TONSILLECTOMY     TUBAL LIGATION      Social History:   Social History   Socioeconomic History   Marital status: Married    Spouse name: Not on file   Number of children: 1   Years of education: Not on file   Highest education level: Not on file  Occupational History   Occupation: MECHANICAL DESIGNER    Employer: [employer] resource strain: Not on file   Food insecurity    Worry: Not on file    Inability: Not on file   Transportation needs    Medical: Not on file    Non-medical: Not on file  Tobacco Use   Smoking status: Current Every Day Smoker    Packs/day: 0.25    Years: 30.00    Pack years: 7.50    Types: Cigarettes   Smokeless tobacco: Never Used   Tobacco comment: restarted 3 weeks ago  Substance and Sexual Activity   Alcohol use: Yes    Comment: occasional; 1 per day   Drug use: No   Sexual activity: Not on file  Lifestyle   Physical activity    Days per week: Not on file    Minutes per session: Not on file   Stress: Not on file  Relationships   Social connections    Talks on phone: Not on file    Gets together: Not on file    Attends religious service: Not on file    Active member of club or organization: Not on file    Attends meetings of clubs or organizations: Not on file    Relationship status: Not on file   Intimate partner violence    Fear of current or ex partner: Not on file    Emotionally abused: Not on file    Physically abused: Not on file    Forced sexual activity: Not on file  Other Topics Concern   Not on file  Social History Narrative   She is married with 1 son   She is a Tax inspector at a factory   Former smoker   Alcohol is used at least once a week glass of wine   Denies drug use    Allergies   Aggrenox [aspirin-dipyridamole er],  Atorvastatin, Codeine, Imitrex [sumatriptan base], Isosorbide nitrate, Other, Quinolones, Rosuvastatin, and Simvastatin  Family history:   Family History  Problem Relation Age of Onset   Coronary artery disease Father    Aneurysm Father        AAA   Melanoma Father    Transient ischemic attack Father    Heart disease Father  AAA (abdominal aortic aneurysm) Father    Aneurysm Mother        AAA   Colon polyps Mother    Heart disease Mother    Irritable bowel syndrome Mother    Diverticulitis Mother    Other Mother        Evlyn Clines   AAA (abdominal aortic aneurysm) Mother    Brain cancer Mother    Breast cancer Paternal Aunt    Liver disease Maternal Uncle    Heart disease Brother    Prostate cancer Paternal Uncle    Mesothelioma Paternal Uncle    Colon cancer Neg Hx     Current Medications:   Prior to Admission medications   Medication Sig Start Date End Date Taking? Authorizing Provider  ALPRAZolam (XANAX) 0.25 MG tablet TAKE 2 TABLETS (0.5MG) BY MOUTH AT BEDTIME AS NEEDED FOR ANXIETY. 08/12/18  Yes Susy Frizzle, MD  aspirin EC 81 MG tablet Take 81 mg by mouth daily.   Yes [provider]  benazepril (LOTENSIN) 20 MG tablet TAKE 1 TABLET BY MOUTH EVERY DAY 06/11/18  Yes Susy Frizzle, MD  butorphanol (STADOL) 10 MG/ML nasal spray INSTILL 1 SPRAY INTO ONE NOSTRIL ONCE DAILY IF NEEDED 05/16/17  Yes Susy Frizzle, MD  Cetirizine HCl (ZYRTEC PO) Take 1 tablet by mouth daily.   Yes [provider]  clopidogrel (PLAVIX) 75 MG tablet Take 1 tablet (75 mg total) by mouth daily. 07/25/18  Yes Susy Frizzle, MD  doxazosin (CARDURA) 4 MG tablet Take 4 mg by mouth daily.   Yes [provider]  Corinda Gubler 565 MG CAPS Take 1 capsule by mouth daily.   Yes [provider]  hydrochlorothiazide (HYDRODIURIL) 25 MG tablet Take 1 tablet (25 mg total) by mouth daily. Stop amlodipine 09/09/18  Yes Pickard, Cammie Mcgee, MD    HYDROcodone-acetaminophen (NORCO/VICODIN) 5-325 MG tablet Take 1 tablet by mouth every 6 (six) hours as needed for moderate pain. 01/23/16  Yes Susy Frizzle, MD  ibuprofen (ADVIL,MOTRIN) 200 MG tablet Take 400 mg by mouth 2 (two) times daily as needed for headache or moderate pain.    Yes [provider]  metoprolol succinate (TOPROL XL) 25 MG 24 hr tablet Take 0.5 tablets (12.5 mg total) by mouth 2 (two) times daily. 08/06/18  Yes Almyra Deforest, PA  nitroGLYCERIN (NITROSTAT) 0.4 MG SL tablet Place 1 tablet (0.4 mg total) under the tongue every 5 (five) minutes as needed for chest pain. 06/04/17  Yes Duke, Tami Lin, PA  pantoprazole (PROTONIX) 40 MG tablet TAKE 1 TABLET BY MOUTH EVERY DAY BEFORE BREAKFAST 08/01/17  Yes Gatha Mayer, MD  POTASSIUM PO Take 1 tablet by mouth daily.   Yes [provider]  rosuvastatin (CRESTOR) 5 MG tablet Take 1 tablet (5 mg total) by mouth daily. 09/09/18  Yes Susy Frizzle, MD  doxazosin (CARDURA) 2 MG tablet Take 1 tablet (2 mg total) by mouth daily. Patient not taking: Reported on 10/13/2018 09/25/18   Susy Frizzle, MD    Physical Exam:   Vitals:   10/13/18 1318  BP: (!) 167/114  Pulse: 68  Resp: 20  Temp: 97.8 F (36.6 C)  TempSrc: Oral  SpO2: 95%     Physical Exam: Blood pressure (!) 167/114, pulse 68, temperature 97.8 F (36.6 C), temperature source Oral, resp. rate 20, SpO2 95 %. Gen: Well-appearing female sitting up at 40 degrees in no acute distress.  Patient is calm and comfortable. Eyes:  Sclerae anicteric. Conjunctiva mildly injected. Neck: Supple, no jugular venous distention. Chest: Moderately good air entry bilaterally with no adventitious sounds.  CV: Distant, regular, no audible murmurs. Abdomen: NABS, soft, nondistended, nontender. No tenderness to light or deep palpation. No rebound, no guarding. Extremities: No edema.  Skin: Warm and dry. No rashes, lesions or wounds. Neuro: Alert and oriented times 3;  grip strength in her hands are 5/5 bilaterally.  Sensation in face seems to be intact and symmetrical.  Speech is normal.  EOMI.  PERRLA.  Tongue is midline.  Shoulder shrug is within normal limits.  Moving all 4 extremities without difficulty.  Speech is normal.  No dysphasia or aphasia.  Psych: Patient is cooperative, logical and coherent with appropriate mood and affect.  Data Review:    Labs: Basic Metabolic Panel: Recent Labs  Lab 10/13/18 1254 10/13/18 1259  NA 138 139  K 4.4 4.0  CL 102 101  CO2  --  25  GLUCOSE 107* 110*  BUN 8 7  CREATININE 0.70 0.70  CALCIUM  --  10.1   Liver Function Tests: Recent Labs  Lab 10/13/18 1259  AST 66*  ALT 92*  ALKPHOS 74  BILITOT 0.9  PROT 7.2  ALBUMIN 4.0   No results for input(s): LIPASE, AMYLASE in the last 168 hours. No results for input(s): AMMONIA in the last 168 hours. CBC: Recent Labs  Lab 10/13/18 1254 10/13/18 1259  WBC  --  9.2  NEUTROABS  --  5.1  HGB 18.7* 17.8*  HCT 55.0* 54.5*  MCV  --  90.1  PLT  --  297   Cardiac Enzymes: No results for input(s): CKTOTAL, CKMB, CKMBINDEX, TROPONINI in the last 168 hours.  BNP (last 3 results) No results for input(s): PROBNP in the last 8760 hours. CBG: Recent Labs  Lab 10/13/18 1247  GLUCAP 103*    Urinalysis    Component Value Date/Time   COLORURINE YELLOW (A) 08/22/2017 1449   APPEARANCEUR CLEAR (A) 08/22/2017 1449   LABSPEC 1.027 08/22/2017 1449   PHURINE 5.0 08/22/2017 1449   GLUCOSEU NEGATIVE 08/22/2017 1449   HGBUR NEGATIVE 08/22/2017 1449   BILIRUBINUR NEGATIVE 08/22/2017 1449   KETONESUR NEGATIVE 08/22/2017 1449   PROTEINUR NEGATIVE 08/22/2017 1449   UROBILINOGEN TEST NOT PERFORMED 12/25/2012 0914   NITRITE NEGATIVE 08/22/2017 1449   LEUKOCYTESUR NEGATIVE 08/22/2017 1449      Radiographic Studies: Ct Angio Head W Or Wo Contrast  Result Date: 10/13/2018 CLINICAL DATA:  Code stroke.  Left arm weakness and tingling EXAM: CT ANGIOGRAPHY HEAD  AND NECK TECHNIQUE: Multidetector CT imaging of the head and neck was performed using the standard protocol during bolus administration of intravenous contrast. Multiplanar CT image reconstructions and MIPs were obtained to evaluate the vascular anatomy. Carotid stenosis measurements (when applicable) are obtained utilizing NASCET criteria, using the distal internal carotid diameter as the denominator. CONTRAST:  44m OMNIPAQUE IOHEXOL 350 MG/ML SOLN COMPARISON:  CTA neck 07/19/2016.  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 This appears to originate from the origin of the left common carotid artery and now measures 10 x 10 x 10 mm. There is been partial thrombosis of the apex of the aneurysm. The innominate artery is widely patent with mild atherosclerotic disease. Left common carotid artery with mild narrowing proximally due to the adjacent aneurysm but otherwise clear of significant stenosis. Left subclavian artery patent. Right  carotid system: Mild atherosclerotic disease in the right carotid bulb without significant stenosis. Left carotid system: Mild atherosclerotic calcification left carotid bulb without stenosis. Vertebral arteries: Both vertebral arteries widely patent to the basilar without significant stenosis. Mild atherosclerotic calcification distal left vertebral artery. Left vertebral artery dominant. Skeleton: ACDF with solid fusion C5 through C7. Disc degeneration and spurring C3-4 and C4-5. No acute skeletal abnormality. Other neck: Negative for mass or adenopathy. Upper chest: Negative Review of the MIP images confirms the above findings CTA HEAD FINDINGS Anterior circulation: Cavernous carotid widely patent bilaterally. Anterior and middle cerebral arteries widely patent bilaterally without stenosis or occlusion. Posterior circulation: Both vertebral arteries patent to the  basilar without significant stenosis. PICA patent bilaterally. Basilar widely patent. Superior cerebellar and posterior cerebral arteries patent bilaterally without stenosis. Venous sinuses: Limited evaluation due to arterial phase scanning. Minimal contrast in the veins. Anatomic variants: None Delayed phase: Not perform Review of the MIP images confirms the above findings IMPRESSION: 1. Negative for emergent large vessel occlusion. No significant intracranial stenosis 2. Mild atherosclerotic disease carotid bulb bilaterally without significant stenosis 3. 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 This is not appear to be likely source of emboli to the left common carotid artery Electronically Signed   By: Franchot Gallo M.D.   On: 10/13/2018 13:28   Ct Angio Neck W Or Wo Contrast  Result Date: 10/13/2018 CLINICAL DATA:  Code stroke.  Left arm weakness and tingling EXAM: CT ANGIOGRAPHY HEAD AND NECK TECHNIQUE: Multidetector CT imaging of the head and neck was performed using the standard protocol during bolus administration of intravenous contrast. Multiplanar CT image reconstructions and MIPs were obtained to evaluate the vascular anatomy. Carotid stenosis measurements (when applicable) are obtained utilizing NASCET criteria, using the distal internal carotid diameter as the denominator. CONTRAST:  80m OMNIPAQUE IOHEXOL 350 MG/ML SOLN COMPARISON:  CTA neck 07/19/2016.  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 This appears to originate from the origin of the left common carotid artery and now measures 10 x 10 x 10 mm. There is been partial thrombosis of the apex of the aneurysm. The innominate artery  is widely patent with mild atherosclerotic disease. Left common carotid artery with mild narrowing proximally due to the adjacent aneurysm but otherwise clear of significant stenosis. Left subclavian artery patent. Right carotid system: Mild atherosclerotic disease in the right carotid bulb without significant stenosis. Left carotid system: Mild atherosclerotic calcification left carotid bulb without stenosis. Vertebral arteries: Both vertebral arteries widely patent to the basilar without significant stenosis. Mild atherosclerotic calcification distal left vertebral artery. Left vertebral artery dominant. Skeleton: ACDF with solid fusion C5 through C7. Disc degeneration and spurring C3-4 and C4-5. No acute skeletal abnormality. Other neck: Negative for mass or adenopathy. Upper chest: Negative Review of the MIP images confirms the above findings CTA HEAD FINDINGS Anterior circulation: Cavernous carotid widely patent bilaterally. Anterior and middle cerebral arteries widely patent bilaterally without stenosis or occlusion. Posterior circulation: Both vertebral arteries patent to the basilar without significant stenosis. PICA patent bilaterally. Basilar widely patent. Superior cerebellar and posterior cerebral arteries patent bilaterally without stenosis. Venous sinuses: Limited evaluation due to arterial phase scanning. Minimal contrast  in the veins. Anatomic variants: None Delayed phase: Not perform Review of the MIP images confirms the above findings IMPRESSION: 1. Negative for emergent large vessel occlusion. No significant intracranial stenosis 2. Mild atherosclerotic disease carotid bulb bilaterally without significant stenosis 3. 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 This is not appear to be likely source of emboli  to the left common carotid artery Electronically Signed   By: Franchot Gallo M.D.   On: 10/13/2018 13:28   Ct Head Code Stroke Wo Contrast  Result Date: 10/13/2018 CLINICAL DATA:  Code stroke.  Left arm weakness and tingling EXAM: CT HEAD WITHOUT CONTRAST TECHNIQUE: Contiguous axial images were obtained from the base of the skull through the vertex without intravenous contrast. COMPARISON:  MRI head 02/17/2016 FINDINGS: Brain: Ventricle size normal. Negative for hemorrhage. Scattered small white matter hypodensities bilaterally. Small chronic infarct in the cerebellum bilaterally. No acute cortical infarct or mass. No midline shift. Vascular: Diffusely hyperdense vessels most likely due to blood pool effect and high hematocrit. Skull: Negative Sinuses/Orbits: Small air-fluid level sphenoid sinus. Remaining sinuses clear. Normal orbit. Other: None ASPECTS ([place] Stroke Program Early CT Score) - Ganglionic level infarction (caudate, lentiform nuclei, internal capsule, insula, M1-M3 cortex): 7 - Supraganglionic infarction (M4-M6 cortex): 3 Total score (0-10 with 10 being normal): 10 IMPRESSION: 1. No acute intracranial abnormality 2. Scattered areas of ischemia in the white matter appear chronic. Small chronic infarcts in the white matter bilaterally. 3. ASPECTS is 10 4. These results were called by telephone at the time of interpretation on 10/13/2018 at 1:05 pm to Dr. Rory Percy , who verbally acknowledged these results. Electronically Signed   By: Franchot Gallo M.D.   On: 10/13/2018 13:06    EKG: Independently reviewed.  Sinus rhythm at 60.  Normal intervals.  Left axis deviation at -50.  Q waves in V1 through V4.  T wave inversions 3, F and V4 through V6.   Assessment/Plan:   Principal Problem:   TIA (transient ischemic attack) Active Problems:   Essential hypertension   Anxiety   Nonalcoholic steatohepatitis (NASH) - with fibrosis   OSA (obstructive sleep apnea)   TIA TIA versus complex  migraine per neurology note. Patient is already on aspirin and Plavix which I have continued Changes in antiplatelet therapy per neurology consultation with recommendations Atorvastatin not initiated secondary to multiple intolerances to statins. No need for PT OT or speech therapy at this point. MRI of brain and echocardiogram requested. Neurology will follow with [place].  HTN Am holding patient's antihypertensives today for permissive hypertension I requested benazepril and Toprol-XL to start in the morning tomorrow I am holding her doxazosin and HCTZ which can be restarted per blood pressure goals  ANXIETY PRN Xanax continued   Other information:   DVT prophylaxis: Lovenox ordered. Code Status: Full code. Family Communication: Patient stated no need to call family Disposition Plan: Home Consults called: Neurology Admission status: Observation  The medical decision making is of moderate complexity, therefore this is a level 2 visit.  Dewaine Oats Tublu Cerissa Zeiger [facility]  If 7PM-7AM, please contact night-coverage [email or web address] Password [facility] 10/13/2018, 2:24 PM

## 2018-10-13 NOTE — ED Notes (Signed)
Got patient undress on the monitor did ekg shown to Dr Vanita Panda patient is resting with call bell in reach

## 2018-10-13 NOTE — ED Provider Notes (Signed)
[facility] EMERGENCY DEPARTMENT Provider Note   CSN: [id number] Arrival date & time: 10/13/18  1236     History   Chief Complaint No chief complaint on file.   HPI Janet [name] is a 53 y.o. female.     HPI  Patient presents with concern of left arm, left face numbness. She notes that she went to bed last night in her usual state of health, awoke today, about 4.5 hours ago, feeling differently from normal. Since onset symptoms have improved, and she has residual perioral numbness, but no weakness in any extremity.  She notes history of prior TIA, perhaps strokes, but with no residual neurologic deficiencies. No recent medication change, diet change, activity change.  No pain. Patient was designated as a code stroke soon after the initial evaluation.  Past Medical History:  Diagnosis Date   Adenoma of right adrenal gland    Benign   Allergy    SEASONAL   Anemia    Anxiety    Arthritis    Asthma    Barrett's esophagus 02/09/2014   CAD (coronary artery disease)    a. prior MI in 2005 with 60-70% dLAD stenosis and 60-70% D2 stenosis (concern for coronary vasospasm)   Depression    Focal nodular hyperplasia of liver    GERD (gastroesophageal reflux disease)    Heart murmur    AS A CHILD   History of adenomatous polyp of colon 02/09/2014   HLD (hyperlipidemia)    HTN (hypertension)    Liver hemangioma    Migraine headache    Myocardial infarction (HCC)    NASH (nonalcoholic steatohepatitis)    nash   OSA (obstructive sleep apnea)    ahi 21.7   Palpitations    Sinus complaint    Stroke ([place])    Unstable angina ([place]) 05/15/2017    Patient Active Problem List   Diagnosis Date Noted   Cirrhosis of liver ([place]) 09/26/2018   Myocardial infarction ([place]) 09/26/2018   Neoplasm of adrenal gland 09/26/2018   Obstructive sleep apnea treated with continuous positive airway pressure (CPAP) 07/01/2018   OSA (obstructive  sleep apnea)    Unintended weight loss 02/18/2018   Polycythemia, secondary 12/27/2017   PAT (paroxysmal atrial tachycardia) ([place]) 07/25/2017   Racing heart beat 06/04/2017   Coronary artery disease involving native coronary artery of native heart without angina pectoris 04/18/2017   History of adenomatous polyp of colon 35/67/0141   Nonalcoholic steatohepatitis (NASH) - with fibrosis 12/11/2013   Gastroesophageal reflux disease without esophagitis 12/11/2013   Focal nodular hyperplasia of liver 12/11/2013   Anxiety    Transient cerebral ischemia 08/08/2012   Palpitations 08/02/2010   CHEST PAIN UNSPECIFIED 05/30/2009   Familial hyperlipidemia 07/26/2008   TOBACCO ABUSE 07/26/2008   CLUSTER HEADACHE SYNDROME UNSPECIFIED 07/26/2008   Essential hypertension 07/26/2008   Coronary atherosclerosis 07/26/2008    Past Surgical History:  Procedure Laterality Date   ABDOMINOPLASTY     BLADDER SUSPENSION     CERVICAL FUSION     C4, 5, 6   COLONOSCOPY W/ BIOPSIES     ESOPHAGOGASTRODUODENOSCOPY     LEFT HEART CATH AND CORONARY ANGIOGRAPHY N/A 05/16/2017   Procedure: LEFT HEART CATH AND CORONARY ANGIOGRAPHY;  Surgeon: Troy Sine, MD;  Location: [facility];  Service: Cardiovascular;  Laterality: N/A;   TONSILLECTOMY     TUBAL LIGATION       OB History   No obstetric history on file.  Home Medications    Prior to Admission medications   Medication Sig Start Date End Date Taking? Authorizing Provider  ALPRAZolam (XANAX) 0.25 MG tablet TAKE 2 TABLETS (0.5MG) BY MOUTH AT BEDTIME AS NEEDED FOR ANXIETY. 08/12/18   Susy Frizzle, MD  aspirin EC 81 MG tablet Take 81 mg by mouth daily.    [provider]  benazepril (LOTENSIN) 20 MG tablet TAKE 1 TABLET BY MOUTH EVERY DAY 06/11/18   Susy Frizzle, MD  butorphanol (STADOL) 10 MG/ML nasal spray INSTILL 1 SPRAY INTO ONE NOSTRIL ONCE DAILY IF NEEDED 05/16/17   Susy Frizzle, MD    clopidogrel (PLAVIX) 75 MG tablet Take 1 tablet (75 mg total) by mouth daily. 07/25/18   Susy Frizzle, MD  doxazosin (CARDURA) 2 MG tablet Take 1 tablet (2 mg total) by mouth daily. 09/25/18   Susy Frizzle, MD  hydrochlorothiazide (HYDRODIURIL) 25 MG tablet Take 1 tablet (25 mg total) by mouth daily. Stop amlodipine 09/09/18   Susy Frizzle, MD  HYDROcodone-acetaminophen (NORCO/VICODIN) 5-325 MG tablet Take 1 tablet by mouth every 6 (six) hours as needed for moderate pain. Patient not taking: Reported on 09/26/2018 01/23/16   Susy Frizzle, MD  ibuprofen (ADVIL,MOTRIN) 200 MG tablet Take 400 mg by mouth 2 (two) times daily as needed for headache or moderate pain.     [provider]  metoprolol succinate (TOPROL XL) 25 MG 24 hr tablet Take 0.5 tablets (12.5 mg total) by mouth 2 (two) times daily. 08/06/18   Almyra Deforest, PA  nitroGLYCERIN (NITROSTAT) 0.4 MG SL tablet Place 1 tablet (0.4 mg total) under the tongue every 5 (five) minutes as needed for chest pain. 06/04/17   Ledora Bottcher, PA  pantoprazole (PROTONIX) 40 MG tablet TAKE 1 TABLET BY MOUTH EVERY DAY BEFORE BREAKFAST 08/01/17   Gatha Mayer, MD  rosuvastatin (CRESTOR) 5 MG tablet Take 1 tablet (5 mg total) by mouth daily. 09/09/18   Susy Frizzle, MD    Family History Family History  Problem Relation Age of Onset   Coronary artery disease Father    Aneurysm Father        AAA   Melanoma Father    Transient ischemic attack Father    Heart disease Father    AAA (abdominal aortic aneurysm) Father    Aneurysm Mother        AAA   Colon polyps Mother    Heart disease Mother    Irritable bowel syndrome Mother    Diverticulitis Mother    Other Mother        Evlyn Clines   AAA (abdominal aortic aneurysm) Mother    Brain cancer Mother    Breast cancer Paternal Aunt    Liver disease Maternal Uncle    Heart disease Brother    Prostate cancer Paternal Uncle    Mesothelioma Paternal Uncle     Colon cancer Neg Hx     Social History Social History   Tobacco Use   Smoking status: Current Every Day Smoker    Packs/day: 0.25    Years: 30.00    Pack years: 7.50    Types: Cigarettes   Smokeless tobacco: Never Used   Tobacco comment: restarted 3 weeks ago  Substance Use Topics   Alcohol use: Yes    Comment: occasional; 1 per day   Drug use: No     Allergies   Aggrenox [aspirin-dipyridamole er], Atorvastatin, Codeine, Imitrex [sumatriptan base], Isosorbide nitrate, Other, Quinolones, Rosuvastatin,  and Simvastatin   Review of Systems Review of Systems  Constitutional:       Per HPI, otherwise negative  HENT:       Per HPI, otherwise negative  Respiratory:       Per HPI, otherwise negative  Cardiovascular:       Per HPI, otherwise negative  Gastrointestinal: Negative for vomiting.  Endocrine:       Negative aside from HPI  Genitourinary:       Neg aside from HPI   Musculoskeletal:       Per HPI, otherwise negative  Skin: Negative.   Neurological: Positive for numbness. Negative for syncope.     Physical Exam Updated Vital Signs There were no vitals taken for this visit.  Physical Exam Vitals signs and nursing note reviewed.  Constitutional:      General: She is not in acute distress.    Appearance: She is well-developed.  HENT:     Head: Normocephalic and atraumatic.  Eyes:     Conjunctiva/sclera: Conjunctivae normal.  Cardiovascular:     Rate and Rhythm: Normal rate and regular rhythm.  Pulmonary:     Effort: Pulmonary effort is normal. No respiratory distress.     Breath sounds: Normal breath sounds. No stridor.  Abdominal:     General: There is no distension.  Skin:    General: Skin is warm and dry.  Neurological:     Mental Status: She is alert and oriented to person, place, and time.     Cranial Nerves: Cranial nerves are intact. No cranial nerve deficit.     Motor: Motor function is intact.     Coordination: Coordination is  intact.      ED Treatments / Results  Labs (all labs ordered are listed, but only abnormal results are displayed) Labs Reviewed  CBC - Abnormal; Notable for the following components:      Result Value   RBC 6.05 (*)    Hemoglobin 17.8 (*)    HCT 54.5 (*)    All other components within normal limits  COMPREHENSIVE METABOLIC PANEL - Abnormal; Notable for the following components:   Glucose, Bld 110 (*)    AST 66 (*)    ALT 92 (*)    All other components within normal limits  I-STAT CHEM 8, ED - Abnormal; Notable for the following components:   Glucose, Bld 107 (*)    Hemoglobin 18.7 (*)    HCT 55.0 (*)    All other components within normal limits  CBG MONITORING, ED - Abnormal; Notable for the following components:   Glucose-Capillary 103 (*)    All other components within normal limits  SARS CORONAVIRUS 2 (HOSPITAL ORDER, [facility])  PROTIME-INR  APTT  DIFFERENTIAL  I-STAT BETA HCG BLOOD, ED ([facility], [facility], [facility] ONLY)    EKG EKG Interpretation  Date/Time:  Monday October 13 2018 13:17:41 EDT Ventricular Rate:  58 PR Interval:    QRS Duration: 96 QT Interval:  439 QTC Calculation: 432 R Axis:   -51 Text Interpretation:  Sinus rhythm LVH with secondary repolarization abnormality Inferior infarct, old Probable anterior infarct, age indeterminate Abnormal ECG Confirmed by Carmin Muskrat [phone]) on 10/13/2018 1:26:34 PM   Radiology Ct Angio Head W Or Wo Contrast  Result Date: 10/13/2018 CLINICAL DATA:  Code stroke.  Left arm weakness and tingling EXAM: CT ANGIOGRAPHY HEAD AND NECK TECHNIQUE: Multidetector CT imaging of the head and neck was performed using the standard protocol during  bolus administration of intravenous contrast. Multiplanar CT image reconstructions and MIPs were obtained to evaluate the vascular anatomy. Carotid stenosis measurements (when applicable) are obtained utilizing NASCET criteria, using the distal internal carotid diameter as  the denominator. CONTRAST:  31m OMNIPAQUE IOHEXOL 350 MG/ML SOLN COMPARISON:  CTA neck 07/19/2016.  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 This appears to originate from the origin of the left common carotid artery and now measures 10 x 10 x 10 mm. There is been partial thrombosis of the apex of the aneurysm. The innominate artery is widely patent with mild atherosclerotic disease. Left common carotid artery with mild narrowing proximally due to the adjacent aneurysm but otherwise clear of significant stenosis. Left subclavian artery patent. Right carotid system: Mild atherosclerotic disease in the right carotid bulb without significant stenosis. Left carotid system: Mild atherosclerotic calcification left carotid bulb without stenosis. Vertebral arteries: Both vertebral arteries widely patent to the basilar without significant stenosis. Mild atherosclerotic calcification distal left vertebral artery. Left vertebral artery dominant. Skeleton: ACDF with solid fusion C5 through C7. Disc degeneration and spurring C3-4 and C4-5. No acute skeletal abnormality. Other neck: Negative for mass or adenopathy. Upper chest: Negative Review of the MIP images confirms the above findings CTA HEAD FINDINGS Anterior circulation: Cavernous carotid widely patent bilaterally. Anterior and middle cerebral arteries widely patent bilaterally without stenosis or occlusion. Posterior circulation: Both vertebral arteries patent to the basilar without significant stenosis. PICA patent bilaterally. Basilar widely patent. Superior cerebellar and posterior cerebral arteries patent bilaterally without stenosis. Venous sinuses: Limited evaluation due to arterial phase scanning. Minimal contrast in the veins. Anatomic variants: None Delayed phase: Not perform Review of the MIP images confirms the above findings IMPRESSION: 1.  Negative for emergent large vessel occlusion. No significant intracranial stenosis 2. Mild atherosclerotic disease carotid bulb bilaterally without significant stenosis 3. 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 This is not appear to be likely source of emboli to the left common carotid artery Electronically Signed   By: CFranchot GalloM.D.   On: 10/13/2018 13:28   Ct Angio Neck W Or Wo Contrast  Result Date: 10/13/2018 CLINICAL DATA:  Code stroke.  Left arm weakness and tingling EXAM: CT ANGIOGRAPHY HEAD AND NECK TECHNIQUE: Multidetector CT imaging of the head and neck was performed using the standard protocol during bolus administration of intravenous contrast. Multiplanar CT image reconstructions and MIPs were obtained to evaluate the vascular anatomy. Carotid stenosis measurements (when applicable) are obtained utilizing NASCET criteria, using the distal internal carotid diameter as the denominator. CONTRAST:  5103mOMNIPAQUE IOHEXOL 350 MG/ML SOLN COMPARISON:  CTA neck 07/19/2016.  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 This appears to originate from the origin of the left common carotid artery and now measures 10 x 10 x 10 mm. There is been partial thrombosis of the apex of the aneurysm. The innominate artery is widely patent with mild atherosclerotic disease. Left common carotid artery with mild narrowing proximally due to the adjacent aneurysm but otherwise clear of significant stenosis. Left subclavian artery patent. Right carotid system: Mild atherosclerotic  disease in the right carotid bulb without significant stenosis. Left carotid system: Mild atherosclerotic calcification left carotid  bulb without stenosis. Vertebral arteries: Both vertebral arteries widely patent to the basilar without significant stenosis. Mild atherosclerotic calcification distal left vertebral artery. Left vertebral artery dominant. Skeleton: ACDF with solid fusion C5 through C7. Disc degeneration and spurring C3-4 and C4-5. No acute skeletal abnormality. Other neck: Negative for mass or adenopathy. Upper chest: Negative Review of the MIP images confirms the above findings CTA HEAD FINDINGS Anterior circulation: Cavernous carotid widely patent bilaterally. Anterior and middle cerebral arteries widely patent bilaterally without stenosis or occlusion. Posterior circulation: Both vertebral arteries patent to the basilar without significant stenosis. PICA patent bilaterally. Basilar widely patent. Superior cerebellar and posterior cerebral arteries patent bilaterally without stenosis. Venous sinuses: Limited evaluation due to arterial phase scanning. Minimal contrast in the veins. Anatomic variants: None Delayed phase: Not perform Review of the MIP images confirms the above findings IMPRESSION: 1. Negative for emergent large vessel occlusion. No significant intracranial stenosis 2. Mild atherosclerotic disease carotid bulb bilaterally without significant stenosis 3. 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 This is not appear to be likely source of emboli to the left common carotid artery Electronically Signed   By: Franchot Gallo M.D.   On: 10/13/2018 13:28   Ct Head Code Stroke Wo Contrast  Result Date: 10/13/2018 CLINICAL DATA:  Code stroke.  Left arm weakness and tingling EXAM: CT HEAD WITHOUT CONTRAST TECHNIQUE: Contiguous axial images were obtained from the base of the skull through the vertex without intravenous contrast.  COMPARISON:  MRI head 02/17/2016 FINDINGS: Brain: Ventricle size normal. Negative for hemorrhage. Scattered small white matter hypodensities bilaterally. Small chronic infarct in the cerebellum bilaterally. No acute cortical infarct or mass. No midline shift. Vascular: Diffusely hyperdense vessels most likely due to blood pool effect and high hematocrit. Skull: Negative Sinuses/Orbits: Small air-fluid level sphenoid sinus. Remaining sinuses clear. Normal orbit. Other: None ASPECTS ([place] Stroke Program Early CT Score) - Ganglionic level infarction (caudate, lentiform nuclei, internal capsule, insula, M1-M3 cortex): 7 - Supraganglionic infarction (M4-M6 cortex): 3 Total score (0-10 with 10 being normal): 10 IMPRESSION: 1. No acute intracranial abnormality 2. Scattered areas of ischemia in the white matter appear chronic. Small chronic infarcts in the white matter bilaterally. 3. ASPECTS is 10 4. These results were called by telephone at the time of interpretation on 10/13/2018 at 1:05 pm to Dr. Rory Percy , who verbally acknowledged these results. Electronically Signed   By: Franchot Gallo M.D.   On: 10/13/2018 13:06    Procedures Procedures (including critical care time)  Medications Ordered in ED Medications  sodium chloride flush (NS) 0.9 % injection 3 mL (has no administration in time range)     Initial Impression / Assessment and Plan / ED Course  I have reviewed the triage vital signs and the nursing notes.  Pertinent labs & imaging results that were available during my care of the patient were reviewed by me and considered in my medical decision making (see chart for details).  Patient in similar condition  This patient with history of TIA presents with essentially resolved neurologic changes, but given the degree of concern, and her history, she will require admission for further evaluation of TIA, minimization of stroke risk. Findings  otherwise generally reassuring initially.  Final  Clinical Impressions(s) / ED Diagnoses  numbness   Carmin Muskrat, MD 10/13/18 1344

## 2018-10-13 NOTE — Consult Note (Signed)
Neurology Consultation  Reason for Consult: Code stroke-left-sided tingling Referring Physician: Dr. Vanita Panda  CC: Left-sided tingling  History is obtained from: Patient, chart  HPI: Janet [name] is a 53 y.o. female who has a past medical history of anxiety, depression, migraines, hypertension, hyperlipidemia, history of an MI in the past, history of TIA in the past, presenting to the emergency room for evaluation of left arm and face tingling and numbness.  According to the patient, she went to bed somewhere around 830 or 9:30 PM yesterday normal and woke up this morning with left arm tingling which then progressed to involve her left side of the face he described a feeling on the face is more of numbness and on the left hand is more of tingling on presentation, NIH stroke scale 1. By the time imaging studies were completed, the patient symptoms had resolved. She does have a history of migraines but did not complain of a headache now or preceding the symptoms.  LKW: 9:30 PM on 10/12/2018 tpa given?: no, outside the window Premorbid modified Rankin scale (mRS): 0  ROS: ROS was performed and is negative except as noted in the HPI.    Past Medical History:  Diagnosis Date  . Adenoma of right adrenal gland    Benign  . Allergy    SEASONAL  . Anemia   . Anxiety   . Arthritis   . Asthma   . Barrett's esophagus 02/09/2014  . CAD (coronary artery disease)    a. prior MI in 2005 with 60-70% dLAD stenosis and 60-70% D2 stenosis (concern for coronary vasospasm)  . Depression   . Focal nodular hyperplasia of liver   . GERD (gastroesophageal reflux disease)   . Heart murmur    AS A CHILD  . History of adenomatous polyp of colon 02/09/2014  . HLD (hyperlipidemia)   . HTN (hypertension)   . Liver hemangioma   . Migraine headache   . Myocardial infarction ([place])   . NASH (nonalcoholic steatohepatitis)    nash  . OSA (obstructive sleep apnea)    ahi 21.7  . Palpitations   . Sinus  complaint   . Stroke ([place])   . Unstable angina ([place]) 05/15/2017    Family History  Problem Relation Age of Onset  . Coronary artery disease Father   . Aneurysm Father        AAA  . Melanoma Father   . Transient ischemic attack Father   . Heart disease Father   . AAA (abdominal aortic aneurysm) Father   . Aneurysm Mother        AAA  . Colon polyps Mother   . Heart disease Mother   . Irritable bowel syndrome Mother   . Diverticulitis Mother   . Other Mother        Evlyn Clines  . AAA (abdominal aortic aneurysm) Mother   . Brain cancer Mother   . Breast cancer Paternal Aunt   . Liver disease Maternal Uncle   . Heart disease Brother   . Prostate cancer Paternal Uncle   . Mesothelioma Paternal Uncle   . Colon cancer Neg Hx    social History:   reports that she has been smoking cigarettes. She has a 7.50 pack-year smoking history. She has never used smokeless tobacco. She reports current alcohol use. She reports that she does not use drugs.  Medications  Current Facility-Administered Medications:  .  sodium chloride flush (NS) 0.9 % injection 3 mL, 3 mL, Intravenous, Once, Carmin Muskrat,  MD  Current Outpatient Medications:  .  ALPRAZolam (XANAX) 0.25 MG tablet, TAKE 2 TABLETS (0.5MG) BY MOUTH AT BEDTIME AS NEEDED FOR ANXIETY., Disp: 60 tablet, Rfl: 0 .  aspirin EC 81 MG tablet, Take 81 mg by mouth daily., Disp: , Rfl:  .  benazepril (LOTENSIN) 20 MG tablet, TAKE 1 TABLET BY MOUTH EVERY DAY, Disp: 90 tablet, Rfl: 2 .  butorphanol (STADOL) 10 MG/ML nasal spray, INSTILL 1 SPRAY INTO ONE NOSTRIL ONCE DAILY IF NEEDED, Disp: 2.5 mL, Rfl: 0 .  clopidogrel (PLAVIX) 75 MG tablet, Take 1 tablet (75 mg total) by mouth daily., Disp: 90 tablet, Rfl: 2 .  doxazosin (CARDURA) 2 MG tablet, Take 1 tablet (2 mg total) by mouth daily., Disp: 90 tablet, Rfl: 1 .  hydrochlorothiazide (HYDRODIURIL) 25 MG tablet, Take 1 tablet (25 mg total) by mouth daily. Stop amlodipine, Disp: 90 tablet, Rfl:  3 .  HYDROcodone-acetaminophen (NORCO/VICODIN) 5-325 MG tablet, Take 1 tablet by mouth every 6 (six) hours as needed for moderate pain. (Patient not taking: Reported on 09/26/2018), Disp: 30 tablet, Rfl: 0 .  ibuprofen (ADVIL,MOTRIN) 200 MG tablet, Take 400 mg by mouth 2 (two) times daily as needed for headache or moderate pain. , Disp: , Rfl:  .  metoprolol succinate (TOPROL XL) 25 MG 24 hr tablet, Take 0.5 tablets (12.5 mg total) by mouth 2 (two) times daily., Disp: 30 tablet, Rfl: 6 .  nitroGLYCERIN (NITROSTAT) 0.4 MG SL tablet, Place 1 tablet (0.4 mg total) under the tongue every 5 (five) minutes as needed for chest pain., Disp: 25 tablet, Rfl: 3 .  pantoprazole (PROTONIX) 40 MG tablet, TAKE 1 TABLET BY MOUTH EVERY DAY BEFORE BREAKFAST, Disp: 90 tablet, Rfl: 3 .  rosuvastatin (CRESTOR) 5 MG tablet, Take 1 tablet (5 mg total) by mouth daily., Disp: 90 tablet, Rfl: 3  Exam: Current vital signs: BP (!) 167/114 (BP Location: Right Arm)   Pulse 68   Temp 97.8 F (36.6 C) (Oral)   Resp 20   SpO2 95%  Vital signs in last 24 hours: Temp:  [97.8 F (36.6 C)] 97.8 F (36.6 C) (06/15 1318) Pulse Rate:  [68] 68 (06/15 1318) Resp:  [20] 20 (06/15 1318) BP: (167)/(114) 167/114 (06/15 1318) SpO2:  [95 %] 95 % (06/15 1318)  GENERAL: Awake, alert in NAD HEENT: - Normocephalic and atraumatic, dry mm, no LN++, no Thyromegally LUNGS - Clear to auscultation bilaterally with no wheezes CV - S1S2 RRR, no m/r/g, equal pulses bilaterally. ABDOMEN - Soft, nontender, nondistended with normoactive BS Ext: warm, well perfused, intact peripheral pulses, no edema  NEURO:  Mental Status: AA&Ox3  Language: speech is not clear.  Naming, repetition, fluency, and comprehension intact Cranial Nerves: PERRL. EOMI, visual fields full, no facial asymmetry, hearing intact, tongue/uvula/soft palate midline, normal sternocleidomastoid and trapezius muscle strength. No evidence of tongue atrophy or fibrillations Motor:  5/5 in all 4s Tone: is normal and bulk is normal Sensation- Intact to light touch bilaterally but notes decreased sensation on face on left and tingling on left arm and hand Coordination: FTN intact bilaterally, no ataxia in BLE. Gait- deferred  NIHSS - 1  Soon after the CT, her symptoms completely resolved with an NIH of 0. Continue to be feeling just a little abnormal that she could not describe completely.  Labs I have reviewed labs in epic and the results pertinent to this consultation are:   CBC    Component Value Date/Time   WBC 11.2 (H) 09/26/2018 0941  RBC 5.97 (H) 09/26/2018 0941   HGB 18.7 (H) 10/13/2018 1254   HGB 17.1 (H) 05/15/2017 1040   HCT 55.0 (H) 10/13/2018 1254   HCT 49.2 (H) 05/15/2017 1040   PLT 279 09/26/2018 0941   PLT 240 05/15/2017 1040   MCV 87.6 09/26/2018 0941   MCV 90 05/15/2017 1040   MCH 29.1 09/26/2018 0941   MCHC 33.3 09/26/2018 0941   RDW 14.5 09/26/2018 0941   RDW 14.2 05/15/2017 1040   LYMPHSABS 3,717 09/09/2018 1113   MONOABS 0.7 03/04/2018 1243   EOSABS 116 09/09/2018 1113   BASOSABS 74 09/09/2018 1113    CMP     Component Value Date/Time   NA 138 10/13/2018 1254   NA 141 06/04/2017 1135   K 4.4 10/13/2018 1254   CL 102 10/13/2018 1254   CO2 22 09/09/2018 1113   GLUCOSE 107 (H) 10/13/2018 1254   BUN 8 10/13/2018 1254   BUN 10 06/04/2017 1135   CREATININE 0.70 10/13/2018 1254   CREATININE 0.72 09/09/2018 1113   CALCIUM 9.9 09/09/2018 1113   PROT 6.8 09/09/2018 1113   PROT 7.5 11/19/2017 1138   ALBUMIN 4.8 11/19/2017 1138   AST 43 (H) 09/09/2018 1113   ALT 69 (H) 09/09/2018 1113   ALKPHOS 84 11/19/2017 1138   BILITOT 0.6 09/09/2018 1113   BILITOT 0.6 11/19/2017 1138   GFRNONAA 94 09/09/2018 1113   GFRAA 109 09/09/2018 1113     Imaging I have reviewed the images obtained:  CT-scan of the brain-no acute changes. 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  This is not appearing to be likely source of emboli.  Assessment: 53 year old woman with past medical history as above presenting with left-sided tingling and numbness that she noticed upon waking up with last known normal last night-outside the window for IV TPA.  No signs of large vessel occlusion on exam. Symptoms concerning for TIA versus complex migraine although there is no history of headache associated with that. She would benefit from stroke/TIA risk factor work-up.  Impression: Evaluate for stroke/TIA Eval for complex migraine  Recommendations: -Admit to hospitalist or observation -Telemetry monitoring -Allow for permissive hypertension for the first 24-48h - only treat PRN if SBP >220 mmHg. Blood pressures can be gradually normalized to SBP<140 upon discharge. -Echocardiogram -HgbA1c, fasting lipid panel -Frequent neuro checks -Prophylactic therapy-Antiplatelet med: Aspirin - dose 338m PO or 3069mPR -Atorvastatin 80 mg PO daily -Risk factor modification -I discussed the importance of exercise as well as smoking/alcohol/illicit drug use cessation. -PT consult, OT consult, Speech consult -If Afib found on telemetry, will need anticoagulation. Decision pending imaging and stroke team rounding.  Please page stroke NP/PA/MD (listed on AMION)  from 8am-4 pm as this patient will be followed by the stroke team at this point.  -- AsAmie PortlandMD [facility] Pager: [phone] 7pm to 7am, please call on call as listed on AMION

## 2018-10-14 ENCOUNTER — Observation Stay (HOSPITAL_BASED_OUTPATIENT_CLINIC_OR_DEPARTMENT_OTHER): Payer: BC Managed Care – PPO

## 2018-10-14 DIAGNOSIS — Z8669 Personal history of other diseases of the nervous system and sense organs: Secondary | ICD-10-CM | POA: Diagnosis not present

## 2018-10-14 DIAGNOSIS — I351 Nonrheumatic aortic (valve) insufficiency: Secondary | ICD-10-CM | POA: Diagnosis not present

## 2018-10-14 DIAGNOSIS — I361 Nonrheumatic tricuspid (valve) insufficiency: Secondary | ICD-10-CM | POA: Diagnosis not present

## 2018-10-14 DIAGNOSIS — I10 Essential (primary) hypertension: Secondary | ICD-10-CM | POA: Diagnosis not present

## 2018-10-14 DIAGNOSIS — G43109 Migraine with aura, not intractable, without status migrainosus: Secondary | ICD-10-CM

## 2018-10-14 LAB — CBC
HCT: 48.7 % — ABNORMAL HIGH (ref 36.0–46.0)
Hemoglobin: 16.1 g/dL — ABNORMAL HIGH (ref 12.0–15.0)
MCH: 29.5 pg (ref 26.0–34.0)
MCHC: 33.1 g/dL (ref 30.0–36.0)
MCV: 89.2 fL (ref 80.0–100.0)
Platelets: 271 10*3/uL (ref 150–400)
RBC: 5.46 MIL/uL — ABNORMAL HIGH (ref 3.87–5.11)
RDW: 15 % (ref 11.5–15.5)
WBC: 9.2 10*3/uL (ref 4.0–10.5)
nRBC: 0 % (ref 0.0–0.2)

## 2018-10-14 LAB — ECHOCARDIOGRAM COMPLETE
Height: 68 in
Weight: 2828.94 oz

## 2018-10-14 LAB — LIPID PANEL
Cholesterol: 276 mg/dL — ABNORMAL HIGH (ref 0–200)
HDL: 52 mg/dL (ref >40)
LDL Cholesterol: 162 mg/dL — ABNORMAL HIGH (ref 0–99)
Total CHOL/HDL Ratio: 5.3 RATIO
Triglycerides: 309 mg/dL — ABNORMAL HIGH (ref <150)
VLDL: 62 mg/dL — ABNORMAL HIGH (ref 0–40)

## 2018-10-14 LAB — HEMOGLOBIN A1C
Hgb A1c MFr Bld: 5.8 % — ABNORMAL HIGH (ref 4.8–5.6)
Mean Plasma Glucose: 119.76 mg/dL

## 2018-10-14 NOTE — Progress Notes (Signed)
Discharge instructions given. Pt verbalized understanding and all questions were answered.  

## 2018-10-14 NOTE — Discharge Summary (Signed)
Physician Discharge Summary  Janet [name] DQQ:[id number] DOB: 03-Feb-1966 DOA: 10/13/2018  PCP: Susy Frizzle, MD  Admit date: 10/13/2018 Discharge date: 10/14/2018  Admitted From: home Discharge disposition: home   Recommendations for Outpatient Follow-Up:   1. Close follow up with Dr. Loretta Plume- complicated migraine   Discharge Diagnosis:   Principal Problem:   TIA (transient ischemic attack) vs complicated migraine Active Problems:   Essential hypertension   Anxiety   Nonalcoholic steatohepatitis (NASH) - with fibrosis   OSA (obstructive sleep apnea)    Discharge Condition: Improved.  Diet recommendation: Low sodium, heart healthy.  Carbohydrate-modified  Wound care: None.  Code status: Full.   History of Present Illness:   Janet [name] is an 53 y.o. female with past medical history significant for hypertension, anxiety, coronary artery disease with MI at age 65 per patient report, history of TIA 2 years ago who was in her usual state of health until this morning when she woke with numbness and tingling of her left hand.  Patient states that her whole hand up to her wrist felt tingly and numb.  She initially thought that she had possibly pinched a nerve in her neck which she had done before however about an hour later she developed numbness and tingling on the left side of her upper and lower lips.  Patient states that she had had a TIA 2 years ago which should presented similarly with numbness and tingling on the left side of her face and lips.  Patient decided to come to the ED for evaluation.  On route to the ED patient states that her hand symptoms resolved and by the time she was being evaluated her facial symptoms had all resolved.  At present patient states that she feels at baseline.   Hospital Course by Problem:   Migraine equivalent most likely, less likely TIA  Code Stroke CT head No acute stroke.  Scattered bilateral chronic infarcts.   Small vessel disease. ASPECTS 10.     CTA head & neck no LVO.  Mild B ICA atherosclerosis.  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 Stable from last CTA head and neck  MRI chronic small vessel ischemia old left cerebellar infarct.  No acute infarct.  2D Echo EF 60 to 65% with no source of embolus  LDL 162  HgbA1c 5.8  Lovenox 40 mg sq daily for VTE prophylaxis  aspirin 81 mg daily and clopidogrel 75 mg daily prior to admission, now on aspirin 81 mg daily and clopidogrel 75 mg daily.  Continue DAPT at time of discharge  Followed by Dr. Tomi Likens as an outpatient.  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 Tomi Likens at [facility]  Hx of "TIA"   March 2014 TIA with left-sided numbness of her head and face   April 2017 left hand numb and weak with wrist drop   September 2017 left-sided vision loss in both eyes   retrospectively could be migraine equivalent episodes  Hypertension  BP stable at 140-160s  BP goal normotensive  Hyperlipidemia  Home meds: Crestor 5  LDL 162  Patient stated statin intolerance, was on PCSK inhibitor Repatha in the past for only 1 or 2 weeks  Continue  follow-up with PCP to consider resume PCS K inhibitor  Tobacco abuse Encourage cessation   Medical Consultants:   neuro   Discharge Exam:   Vitals:   10/14/18 0822 10/14/18 1135  BP: (!) 144/77 (!) 162/97  Pulse: (!) 56 (!) 50  Resp:    Temp: 98.5 F (36.9 C) 98.9 F (37.2 C)  SpO2:     Vitals:   10/14/18 0300 10/14/18 0346 10/14/18 0822 10/14/18 1135  BP: (!) 142/87 (!) 149/102 (!) 144/77 (!) 162/97  Pulse: 61 61 (!) 56 (!) 50  Resp: 16     Temp: 98.1 F (36.7 C) 98 F (36.7 C) 98.5 F (36.9 C) 98.9 F (37.2 C)  TempSrc:  Oral Oral Oral Oral  SpO2: 96% 97%    Weight:      Height:        General exam: Appears calm and comfortable.    The results of significant diagnostics from this hospitalization (including imaging, microbiology, ancillary and laboratory) are listed below for reference.     Procedures and Diagnostic Studies:   Ct Angio Head W Or Wo Contrast  Result Date: 10/13/2018 CLINICAL DATA:  Code stroke.  Left arm weakness and tingling EXAM: CT ANGIOGRAPHY HEAD AND NECK TECHNIQUE: Multidetector CT imaging of the head and neck was performed using the standard protocol during bolus administration of intravenous contrast. Multiplanar CT image reconstructions and MIPs were obtained to evaluate the vascular anatomy. Carotid stenosis measurements (when applicable) are obtained utilizing NASCET criteria, using the distal internal carotid diameter as the denominator. CONTRAST:  56m OMNIPAQUE IOHEXOL 350 MG/ML SOLN COMPARISON:  CTA neck 07/19/2016.  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 This appears to originate from the origin of the left common carotid artery and now measures 10 x 10 x 10 mm. There is been partial thrombosis of the apex of the aneurysm. The innominate artery is widely patent with mild atherosclerotic disease. Left common carotid artery with mild narrowing proximally due to the adjacent aneurysm but otherwise clear of significant stenosis. Left subclavian artery patent. Right carotid system: Mild atherosclerotic disease in the right carotid bulb without significant stenosis. Left carotid system: Mild atherosclerotic calcification left carotid bulb without stenosis. Vertebral arteries: Both vertebral arteries widely patent to the basilar without significant stenosis. Mild atherosclerotic calcification distal left vertebral artery. Left vertebral artery dominant. Skeleton: ACDF  with solid fusion C5 through C7. Disc degeneration and spurring C3-4 and C4-5. No acute skeletal abnormality. Other neck: Negative for mass or adenopathy. Upper chest: Negative Review of the MIP images confirms the above findings CTA HEAD FINDINGS Anterior circulation: Cavernous carotid widely patent bilaterally. Anterior and middle cerebral arteries widely patent bilaterally without stenosis or occlusion. Posterior circulation: Both vertebral arteries patent to the basilar without significant stenosis. PICA patent bilaterally. Basilar widely patent. Superior cerebellar and posterior cerebral arteries patent bilaterally without stenosis. Venous sinuses: Limited evaluation due to arterial phase scanning. Minimal contrast in the veins. Anatomic variants: None Delayed phase: Not perform Review of the MIP images confirms the above findings IMPRESSION: 1. Negative for emergent large vessel occlusion. No significant intracranial stenosis 2. Mild atherosclerotic disease carotid bulb bilaterally without significant stenosis 3. 10 x 10 x 10 mm aneurysm of the aortic arch which appears to be related to the origin of the left common carotid artery appears slightly smaller compared with the prior CTA with interval partial thrombosis of  the apex of the aneurysm. This is not appear to be likely source of emboli to the left common carotid artery Electronically Signed   By: Franchot Gallo M.D.   On: 10/13/2018 13:28   Ct Angio Neck W Or Wo Contrast  Result Date: 10/13/2018 CLINICAL DATA:  Code stroke.  Left arm weakness and tingling EXAM: CT ANGIOGRAPHY HEAD AND NECK TECHNIQUE: Multidetector CT imaging of the head and neck was performed using the standard protocol during bolus administration of intravenous contrast. Multiplanar CT image reconstructions and MIPs were obtained to evaluate the vascular anatomy. Carotid stenosis measurements (when applicable) are obtained utilizing NASCET criteria, using the distal internal carotid  diameter as the denominator. CONTRAST:  66m OMNIPAQUE IOHEXOL 350 MG/ML SOLN COMPARISON:  CTA neck 07/19/2016.  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 This appears to originate from the origin of the left common carotid artery and now measures 10 x 10 x 10 mm. There is been partial thrombosis of the apex of the aneurysm. The innominate artery is widely patent with mild atherosclerotic disease. Left common carotid artery with mild narrowing proximally due to the adjacent aneurysm but otherwise clear of significant stenosis. Left subclavian artery patent. Right carotid system: Mild atherosclerotic disease in the right carotid bulb without significant stenosis. Left carotid system: Mild atherosclerotic calcification left carotid bulb without stenosis. Vertebral arteries: Both vertebral arteries widely patent to the basilar without significant stenosis. Mild atherosclerotic calcification distal left vertebral artery. Left vertebral artery dominant. Skeleton: ACDF with solid fusion C5 through C7. Disc degeneration and spurring C3-4 and C4-5. No acute skeletal abnormality. Other neck: Negative for mass or adenopathy. Upper chest: Negative Review of the MIP images confirms the above findings CTA HEAD FINDINGS Anterior circulation: Cavernous carotid widely patent bilaterally. Anterior and middle cerebral arteries widely patent bilaterally without stenosis or occlusion. Posterior circulation: Both vertebral arteries patent to the basilar without significant stenosis. PICA patent bilaterally. Basilar widely patent. Superior cerebellar and posterior cerebral arteries patent bilaterally without stenosis. Venous sinuses: Limited evaluation due to arterial phase scanning. Minimal contrast in the veins. Anatomic variants: None Delayed phase: Not perform Review of the MIP images confirms the above findings  IMPRESSION: 1. Negative for emergent large vessel occlusion. No significant intracranial stenosis 2. Mild atherosclerotic disease carotid bulb bilaterally without significant stenosis 3. 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 This is not appear to be likely source of emboli to the left common carotid artery Electronically Signed   By: CFranchot GalloM.D.   On: 10/13/2018 13:28   Mr Brain Wo Contrast  Result Date: 10/13/2018 CLINICAL DATA:  Left hand numbness and tingling EXAM: MRI HEAD WITHOUT CONTRAST TECHNIQUE: Multiplanar, multiecho pulse sequences of the brain and surrounding structures were obtained without intravenous contrast. COMPARISON:  CTA head neck 10/13/2018 FINDINGS: BRAIN: There is no acute infarct, acute hemorrhage or extra-axial collection. The midline structures are normal. Old left cerebellar infarct. Multifocal white matter hyperintensity, most commonly due to chronic ischemic microangiopathy. The cerebral and cerebellar volume are age-appropriate. No hydrocephalus. Susceptibility-sensitive sequences show no chronic microhemorrhage or superficial siderosis. No mass lesion. VASCULAR: The major intracranial arterial and venous sinus flow voids are normal. SKULL AND  UPPER CERVICAL SPINE: Calvarial bone marrow signal is normal. There is no skull base mass. Visualized upper cervical spine and soft tissues are normal. SINUSES/ORBITS: No fluid levels or advanced mucosal thickening. No mastoid or middle ear effusion. The orbits are normal. 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 Electronically Signed   By: Ulyses Jarred M.D.   On: 10/13/2018 20:04   Ct Head Code Stroke Wo Contrast  Result Date: 10/13/2018 CLINICAL DATA:  Code  stroke.  Left arm weakness and tingling EXAM: CT HEAD WITHOUT CONTRAST TECHNIQUE: Contiguous axial images were obtained from the base of the skull through the vertex without intravenous contrast. COMPARISON:  MRI head 02/17/2016 FINDINGS: Brain: Ventricle size normal. Negative for hemorrhage. Scattered small white matter hypodensities bilaterally. Small chronic infarct in the cerebellum bilaterally. No acute cortical infarct or mass. No midline shift. Vascular: Diffusely hyperdense vessels most likely due to blood pool effect and high hematocrit. Skull: Negative Sinuses/Orbits: Small air-fluid level sphenoid sinus. Remaining sinuses clear. Normal orbit. Other: None ASPECTS ([place] Stroke Program Early CT Score) - Ganglionic level infarction (caudate, lentiform nuclei, internal capsule, insula, M1-M3 cortex): 7 - Supraganglionic infarction (M4-M6 cortex): 3 Total score (0-10 with 10 being normal): 10 IMPRESSION: 1. No acute intracranial abnormality 2. Scattered areas of ischemia in the white matter appear chronic. Small chronic infarcts in the white matter bilaterally. 3. ASPECTS is 10 4. These results were called by telephone at the time of interpretation on 10/13/2018 at 1:05 pm to Dr. Rory Percy , who verbally acknowledged these results. Electronically Signed   By: Franchot Gallo M.D.   On: 10/13/2018 13:06     Labs:   Basic Metabolic Panel: Recent Labs  Lab 10/13/18 1254 10/13/18 1259  NA 138 139  K 4.4 4.0  CL 102 101  CO2  --  25  GLUCOSE 107* 110*  BUN 8 7  CREATININE 0.70 0.70  CALCIUM  --  10.1   GFR Estimated Creatinine Clearance: 88.3 mL/min (by C-G formula based on SCr of 0.7 mg/dL). Liver Function Tests: Recent Labs  Lab 10/13/18 1259  AST 66*  ALT 92*  ALKPHOS 74  BILITOT 0.9  PROT 7.2  ALBUMIN 4.0   No results for input(s): LIPASE, AMYLASE in the last 168 hours. No results for input(s): AMMONIA in the last 168 hours. Coagulation profile Recent Labs  Lab 10/13/18 1259    INR 1.0    CBC: Recent Labs  Lab 10/13/18 1254 10/13/18 1259 10/14/18 0527  WBC  --  9.2 9.2  NEUTROABS  --  5.1  --   HGB 18.7* 17.8* 16.1*  HCT 55.0* 54.5* 48.7*  MCV  --  90.1 89.2  PLT  --  297 271   Cardiac Enzymes: No results for input(s): CKTOTAL, CKMB, CKMBINDEX, TROPONINI in the last 168 hours. BNP: Invalid input(s): POCBNP CBG: Recent Labs  Lab 10/13/18 1247  GLUCAP 103*   D-Dimer No results for input(s): DDIMER in the last 72 hours. Hgb A1c Recent Labs    10/14/18 0527  HGBA1C 5.8*   Lipid Profile Recent Labs    10/14/18 0527  CHOL 276*  HDL 52  LDLCALC 162*  TRIG 309*  CHOLHDL 5.3   Thyroid function studies No results for input(s): TSH, T4TOTAL, T3FREE, THYROIDAB in the last 72 hours.  Invalid input(s): FREET3 Anemia work up No results for input(s): VITAMINB12, FOLATE, FERRITIN, TIBC, IRON, RETICCTPCT in the last 72 hours. Microbiology Recent Results (from the past  240 hour(s))  SARS Coronavirus 2     Status: None   Collection Time: 10/13/18  2:51 PM  Result Value Ref Range Status   SARS Coronavirus 2 NOT DETECTED NOT DETECTED Final    Comment: (NOTE) SARS-CoV-2 target nucleic acids are NOT DETECTED. The SARS-CoV-2 RNA is generally detectable in upper and lower respiratory specimens during the acute phase of infection.  Negative  results do not preclude SARS-CoV-2 infection, do not rule out co-infections with other pathogens, and should not be used as the sole basis for treatment or other patient management decisions.  Negative results must be combined with clinical observations, patient history, and epidemiological information. The expected result is Not Detected. Fact Sheet for Patients: http://www.biofiredefense.com/wp-content/uploads/2020/03/BIOFIRE-COVID -19-patients.pdf Fact Sheet for Healthcare Providers: http://www.biofiredefense.com/wp-content/uploads/2020/03/BIOFIRE-COVID -19-hcp.pdf This test is not yet approved or  cleared by the [place] and  has been authorized for detection and/or diagnosis of SARS-CoV-2 by FDA under an Emergency Use Authorization (EUA).  This EUA will remain in effec t (meaning this test can be used) for the duration of  the COVID-19 declaration under Section 564(b)(1) of the Act, 21 U.S.C. section 360bbb-3(b)(1), unless the authorization is terminated or revoked sooner. Performed at [facility], [place] [address], [place], [place] [address]      Discharge Instructions:   Discharge Instructions    Diet - low sodium heart healthy   Complete by: As directed    Increase activity slowly   Complete by: As directed      Allergies as of 10/14/2018      Reactions   Aggrenox [aspirin-dipyridamole Er] Tinitus   Atorvastatin Other (See Comments)   Myalgias   Codeine Itching, Other (See Comments)   Nose itches   Imitrex [sumatriptan Base] Swelling, Other (See Comments)   Throat swelling   Isosorbide Nitrate Other (See Comments)   headaches   Other Other (See Comments)   Artificial sweeteners cause heart palpitations (stevia is ok)   Quinolones    Patient was warned about not using Cipro and similar antibiotics. Recent studies have raised concern that fluoroquinolone antibiotics could be associated with an increased risk of aortic aneurysm Fluoroquinolones have non-antimicrobial properties that might jeopardise the integrity of the extracellular matrix of the vascular wall In a  propensity score matched cohort study in [place], there was a 66% increased rate of aortic aneurysm or dissection associated with oral fluoroquinolone use, compared wit   Rosuvastatin Other (See Comments)   Restless legs   Simvastatin Other (See Comments)   Myalgias      Medication List    TAKE these medications   ALPRAZolam 0.25 MG tablet Commonly known as: XANAX TAKE 2 TABLETS (0.5MG) BY MOUTH AT BEDTIME AS NEEDED FOR ANXIETY.   aspirin EC 81 MG tablet Take 81 mg by mouth  daily.   benazepril 20 MG tablet Commonly known as: LOTENSIN TAKE 1 TABLET BY MOUTH EVERY DAY   butorphanol 10 MG/ML nasal spray Commonly known as: STADOL INSTILL 1 SPRAY INTO ONE NOSTRIL ONCE DAILY IF NEEDED   clopidogrel 75 MG tablet Commonly known as: PLAVIX Take 1 tablet (75 mg total) by mouth daily.   doxazosin 4 MG tablet Commonly known as: CARDURA Take 4 mg by mouth daily. What changed: Another medication with the same name was removed. Continue taking this medication, and follow the directions you see here.   Hawthorn Berry 565 MG Caps Take 1 capsule by mouth daily.   hydrochlorothiazide 25 MG tablet Commonly known as: HYDRODIURIL  Take 1 tablet (25 mg total) by mouth daily. Stop amlodipine   HYDROcodone-acetaminophen 5-325 MG tablet Commonly known as: NORCO/VICODIN Take 1 tablet by mouth every 6 (six) hours as needed for moderate pain.   ibuprofen 200 MG tablet Commonly known as: ADVIL Take 400 mg by mouth 2 (two) times daily as needed for headache or moderate pain.   metoprolol succinate 25 MG 24 hr tablet Commonly known as: Toprol XL Take 0.5 tablets (12.5 mg total) by mouth 2 (two) times daily.   nitroGLYCERIN 0.4 MG SL tablet Commonly known as: NITROSTAT Place 1 tablet (0.4 mg total) under the tongue every 5 (five) minutes as needed for chest pain.   pantoprazole 40 MG tablet Commonly known as: PROTONIX TAKE 1 TABLET BY MOUTH EVERY DAY BEFORE BREAKFAST   POTASSIUM PO Take 1 tablet by mouth daily.   rosuvastatin 5 MG tablet Commonly known as: Crestor Take 1 tablet (5 mg total) by mouth daily.   ZYRTEC PO Take 1 tablet by mouth daily.         Time coordinating discharge: 25 min  Signed:  Geradine Girt DO  [facility] 10/14/2018, 3:06 PM

## 2018-10-14 NOTE — TOC Transition Note (Signed)
Transition of Care [facility]) - CM/SW Discharge Note   Patient Details  Name: Janet [name] MRN: [id number] Date of Birth: [birth year]  [facility] CM/SW Contact:  Pollie Friar, RN Phone Number: 10/14/2018, 4:33 PM   Clinical Narrative:    Pt discharging home with self care. Pt has PCP, insurance and transportation home.   Final next level of care: Home/Self Care Barriers to Discharge: No Barriers Identified   Patient Goals and CMS Choice        Discharge Placement                       Discharge Plan and Services                                     Social Determinants of Health (SDOH) Interventions     Readmission Risk Interventions No flowsheet data found.

## 2018-10-14 NOTE — Progress Notes (Signed)
  Echocardiogram 2D Echocardiogram has been performed.  Janet [name] 10/14/2018, 12:52 PM

## 2018-10-14 NOTE — Progress Notes (Signed)
STROKE TEAM PROGRESS NOTE   INTERVAL HISTORY Patient lying in bed, stated that her symptoms all resolved.  She denies any headache at that time when symptoms started. She has been following with Dr. Loretta Plume at [facility] for migraine and "TIA" episodes.   Vitals:   10/14/18 0300 10/14/18 0346 10/14/18 0822 10/14/18 1135  BP: (!) 142/87 (!) 149/102 (!) 144/77 (!) 162/97  Pulse: 61 61 (!) 56 (!) 50  Resp: 16     Temp: 98.1 F (36.7 C) 98 F (36.7 C) 98.5 F (36.9 C) 98.9 F (37.2 C)  TempSrc: Oral Oral Oral Oral  SpO2: 96% 97%    Weight:      Height:        CBC:  Recent Labs  Lab 10/13/18 1259 10/14/18 0527  WBC 9.2 9.2  NEUTROABS 5.1  --   HGB 17.8* 16.1*  HCT 54.5* 48.7*  MCV 90.1 89.2  PLT 297 716    Basic Metabolic Panel:  Recent Labs  Lab 10/13/18 1254 10/13/18 1259  NA 138 139  K 4.4 4.0  CL 102 101  CO2  --  25  GLUCOSE 107* 110*  BUN 8 7  CREATININE 0.70 0.70  CALCIUM  --  10.1   Lipid Panel:     Component Value Date/Time   CHOL 276 (H) 10/14/2018 0527   CHOL 299 (H) 05/23/2018 1101   TRIG 309 (H) 10/14/2018 0527   HDL 52 10/14/2018 0527   HDL 69 05/23/2018 1101   CHOLHDL 5.3 10/14/2018 0527   VLDL 62 (H) 10/14/2018 0527   LDLCALC 162 (H) 10/14/2018 0527   LDLCALC 188 (H) 09/09/2018 1113   HgbA1c:  Lab Results  Component Value Date   HGBA1C 5.8 (H) 10/14/2018   Urine Drug Screen: No results found for: LABOPIA, COCAINSCRNUR, LABBENZ, AMPHETMU, THCU, LABBARB  Alcohol Level No results found for: ETH  IMAGING Ct Angio Head W Or Wo Contrast  Result Date: 10/13/2018 CLINICAL DATA:  Code stroke.  Left arm weakness and tingling EXAM: CT ANGIOGRAPHY HEAD AND NECK TECHNIQUE: Multidetector CT imaging of the head and neck was performed using the standard protocol during bolus administration of intravenous contrast. Multiplanar CT image reconstructions and MIPs were obtained to evaluate the vascular anatomy. Carotid stenosis measurements (when applicable) are  obtained utilizing NASCET criteria, using the distal internal carotid diameter as the denominator. CONTRAST:  11m OMNIPAQUE IOHEXOL 350 MG/ML SOLN COMPARISON:  CTA neck 07/19/2016.  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 This appears to originate from the origin of the left common carotid artery and now measures 10 x 10 x 10 mm. There is been partial thrombosis of the apex of the aneurysm. The innominate artery is widely patent with mild atherosclerotic disease. Left common carotid artery with mild narrowing proximally due to the adjacent aneurysm but otherwise clear of significant stenosis. Left subclavian artery patent. Right carotid system: Mild atherosclerotic disease in the right carotid bulb without significant stenosis. Left carotid system: Mild atherosclerotic calcification left carotid bulb without stenosis. Vertebral arteries: Both vertebral arteries widely patent to the basilar without significant stenosis. Mild atherosclerotic calcification distal left vertebral artery. Left vertebral artery dominant. Skeleton: ACDF with solid fusion C5 through C7. Disc degeneration and spurring C3-4 and C4-5. No acute skeletal abnormality. Other neck: Negative for mass or adenopathy. Upper chest: Negative Review of the MIP images confirms the above findings CTA HEAD  FINDINGS Anterior circulation: Cavernous carotid widely patent bilaterally. Anterior and middle cerebral arteries widely patent bilaterally without stenosis or occlusion. Posterior circulation: Both vertebral arteries patent to the basilar without significant stenosis. PICA patent bilaterally. Basilar widely patent. Superior cerebellar and posterior cerebral arteries patent bilaterally without stenosis. Venous sinuses: Limited evaluation due to arterial phase scanning. Minimal contrast in the veins. Anatomic variants: None Delayed  phase: Not perform Review of the MIP images confirms the above findings IMPRESSION: 1. Negative for emergent large vessel occlusion. No significant intracranial stenosis 2. Mild atherosclerotic disease carotid bulb bilaterally without significant stenosis 3. 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 This is not appear to be likely source of emboli to the left common carotid artery Electronically Signed   By: Franchot Gallo M.D.   On: 10/13/2018 13:28   Ct Angio Neck W Or Wo Contrast  Result Date: 10/13/2018 CLINICAL DATA:  Code stroke.  Left arm weakness and tingling EXAM: CT ANGIOGRAPHY HEAD AND NECK TECHNIQUE: Multidetector CT imaging of the head and neck was performed using the standard protocol during bolus administration of intravenous contrast. Multiplanar CT image reconstructions and MIPs were obtained to evaluate the vascular anatomy. Carotid stenosis measurements (when applicable) are obtained utilizing NASCET criteria, using the distal internal carotid diameter as the denominator. CONTRAST:  36m OMNIPAQUE IOHEXOL 350 MG/ML SOLN COMPARISON:  CTA neck 07/19/2016.  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 This appears to originate from the origin of the left common carotid artery and now measures 10 x 10 x 10 mm. There is been partial thrombosis of the apex of the aneurysm. The innominate artery is widely patent with mild atherosclerotic disease. Left common carotid artery with mild narrowing proximally due to the adjacent aneurysm but otherwise clear of significant stenosis. Left subclavian artery patent. Right carotid system: Mild atherosclerotic disease in the right carotid bulb without  significant stenosis. Left carotid system: Mild atherosclerotic calcification left carotid bulb without stenosis. Vertebral arteries: Both vertebral arteries widely patent to the basilar without significant stenosis. Mild atherosclerotic calcification distal left vertebral artery. Left vertebral artery dominant. Skeleton: ACDF with solid fusion C5 through C7. Disc degeneration and spurring C3-4 and C4-5. No acute skeletal abnormality. Other neck: Negative for mass or adenopathy. Upper chest: Negative Review of the MIP images confirms the above findings CTA HEAD FINDINGS Anterior circulation: Cavernous carotid widely patent bilaterally. Anterior and middle cerebral arteries widely patent bilaterally without stenosis or occlusion. Posterior circulation: Both vertebral arteries patent to the basilar without significant stenosis. PICA patent bilaterally. Basilar widely patent. Superior cerebellar and posterior cerebral arteries patent bilaterally without stenosis. Venous sinuses: Limited evaluation due to arterial phase scanning. Minimal contrast in the veins. Anatomic variants: None Delayed phase: Not perform Review of the MIP images confirms the above findings IMPRESSION: 1. Negative for emergent large vessel occlusion. No significant intracranial stenosis 2. Mild atherosclerotic disease carotid bulb bilaterally without significant stenosis 3. 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 This is not  appear to be likely source of emboli to the left common carotid artery Electronically Signed   By: Franchot Gallo M.D.   On: 10/13/2018 13:28   Mr Brain Wo Contrast  Result Date: 10/13/2018 CLINICAL DATA:  Left hand numbness and tingling EXAM: MRI HEAD WITHOUT CONTRAST TECHNIQUE: Multiplanar, multiecho pulse sequences of the brain  and surrounding structures were obtained without intravenous contrast. COMPARISON:  CTA head neck 10/13/2018 FINDINGS: BRAIN: There is no acute infarct, acute hemorrhage or extra-axial collection. The midline structures are normal. Old left cerebellar infarct. Multifocal white matter hyperintensity, most commonly due to chronic ischemic microangiopathy. The cerebral and cerebellar volume are age-appropriate. No hydrocephalus. Susceptibility-sensitive sequences show no chronic microhemorrhage or superficial siderosis. No mass lesion. VASCULAR: The major intracranial arterial and venous sinus flow voids are normal. SKULL AND UPPER CERVICAL SPINE: Calvarial bone marrow signal is normal. There is no skull base mass. Visualized upper cervical spine and soft tissues are normal. SINUSES/ORBITS: No fluid levels or advanced mucosal thickening. No mastoid or middle ear effusion. The orbits are normal. 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 Electronically Signed   By: Ulyses Jarred M.D.   On: 10/13/2018 20:04   Ct Head Code Stroke Wo Contrast  Result Date: 10/13/2018 CLINICAL DATA:  Code stroke.  Left arm weakness and tingling EXAM: CT HEAD WITHOUT CONTRAST TECHNIQUE: Contiguous axial images were obtained from the base of the skull through the vertex without intravenous contrast. COMPARISON:  MRI head 02/17/2016 FINDINGS: Brain: Ventricle size normal. Negative for hemorrhage. Scattered small white matter hypodensities bilaterally. Small chronic infarct in the cerebellum bilaterally. No acute cortical infarct or mass. No midline shift. Vascular: Diffusely hyperdense vessels most likely due to blood pool effect and high hematocrit. Skull: Negative Sinuses/Orbits: Small air-fluid level sphenoid sinus. Remaining sinuses clear. Normal orbit. Other: None ASPECTS ([place] Stroke Program Early CT Score) - Ganglionic level infarction (caudate, lentiform  nuclei, internal capsule, insula, M1-M3 cortex): 7 - Supraganglionic infarction (M4-M6 cortex): 3 Total score (0-10 with 10 being normal): 10 IMPRESSION: 1. No acute intracranial abnormality 2. Scattered areas of ischemia in the white matter appear chronic. Small chronic infarcts in the white matter bilaterally. 3. ASPECTS is 10 4. These results were called by telephone at the time of interpretation on 10/13/2018 at 1:05 pm to Dr. Rory Percy , who verbally acknowledged these results. Electronically Signed   By: Franchot Gallo M.D.   On: 10/13/2018 13:06    PHYSICAL EXAM  Temp:  [97.8 F (36.6 C)-98.9 F (37.2 C)] 98.6 F (37 C) (06/16 1559) Pulse Rate:  [50-84] 59 (06/16 1559) Resp:  [15-16] 16 (06/16 0300) BP: (131-162)/(53-107) 135/95 (06/16 1559) SpO2:  [96 %-100 %] 97 % (06/16 0346) Weight:  [80.2 kg] 80.2 kg (06/15 1724)  General - Well nourished, well developed, in no apparent distress.  Ophthalmologic - fundi not visualized due to noncooperation.  Cardiovascular - Regular rate and rhythm.  Mental Status -  Level of arousal and orientation to time, place, and person were intact. Language including expression, naming, repetition, comprehension was assessed and found intact. Attention span and concentration were normal. Fund of Knowledge was assessed and was intact.  Cranial Nerves II - XII - II - Visual field intact OU. III, IV, VI - Extraocular movements intact. V - Facial sensation intact bilaterally. VII - Facial movement intact bilaterally. VIII - Hearing & vestibular intact bilaterally. X - Palate elevates symmetrically. XI - Chin turning & shoulder shrug  intact bilaterally. XII - Tongue protrusion intact.  Motor Strength - The patient's strength was normal in all extremities and pronator drift was absent.  Bulk was normal and fasciculations were absent.   Motor Tone - Muscle tone was assessed at the neck and appendages and was normal.  Reflexes - The patient's reflexes  were symmetrical in all extremities and she had no pathological reflexes.  Sensory - Light touch, temperature/pinprick were assessed and were symmetrical.    Coordination - The patient had normal movements in the hands and feet with no ataxia or dysmetria.  Tremor was absent.  Gait and Station - deferred.   ASSESSMENT/PLAN Ms. SHYLAH DOSSANTOS is a 53 y.o. female with history of anxiety, depression, migraines, HTN, HLD, MI, TIA presenting with left arm and face tingling and numbness.   Migraine equivalent most likely, less likely TIA  Code Stroke CT head No acute stroke.  Scattered bilateral chronic infarcts.  Small vessel disease. ASPECTS 10.     CTA head & neck no LVO.  Mild B ICA atherosclerosis.  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 Stable from last CTA head and neck  MRI chronic small vessel ischemia old left cerebellar infarct.  No acute infarct.  2D Echo EF 60 to 65% with no source of embolus  LDL 162  HgbA1c 5.8  Lovenox 40 mg sq daily for VTE prophylaxis  aspirin 81 mg daily and clopidogrel 75 mg daily prior to admission, now on aspirin 81 mg daily and clopidogrel 75 mg daily.  Continue DAPT at time of discharge  Therapy recommendations: No therapy needs  Disposition: Return home  Followed by Dr. Tomi Likens as an outpatient.  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 Tomi Likens at [facility]  Hx of "TIA"   March 2014 TIA with left-sided numbness of her head and face   April 2017 left hand numb and weak with wrist drop   September 2017 left-sided vision loss in both eyes   retrospectively could be migraine equivalent episodes  Hypertension  BP stable at 140-160s . BP goal normotensive  Hyperlipidemia  Home  meds: Crestor 5  LDL 162  Patient stated statin intolerance, was on PCSK inhibitor Repatha in the past for only 1 or 2 weeks  Continue follow-up with PCP to consider resume PCS K inhibitor  Tobacco abuse  Current smoker  Smoking cessation counseling provided  Nicotine patch provided  Pt is willing to quit  Other Stroke Risk Factors  ETOH use, advised to drink no more than 1 drink(s) a day  Coronary artery disease with MI at age 63  Obstructive sleep apnea uses CPAP at home.  Followed by Dr. Brett Fairy as an outpatient.  Continue follow-up with her at time of discharge.  Other Active Problems  Anxiety  NASH  Hospital day # 0  Neurology will sign off. Please call with questions. Pt will follow up with stroke clinic NP at [facility] in about 4 weeks. Thanks for the consult.  Rosalin Hawking, MD PhD Stroke Neurology 10/14/2018 4:15 PM    To contact Stroke Continuity provider, please refer to [email or web address] After hours, contact General Neurology

## 2018-10-20 ENCOUNTER — Other Ambulatory Visit: Payer: Self-pay

## 2018-10-20 MED ORDER — PANTOPRAZOLE SODIUM 40 MG PO TBEC: DELAYED_RELEASE_TABLET | ORAL | 0 refills | Status: DC

## 2018-10-20 NOTE — Progress Notes (Signed)
Virtual Visit via Video Note The purpose of this virtual visit is to provide medical care while limiting exposure to the novel coronavirus.    Consent was obtained for video visit:  Yes.   Answered questions that patient had about telehealth interaction:  Yes.   I discussed the limitations, risks, security and privacy concerns of performing an evaluation and management service by telemedicine. I also discussed with the patient that there may be a patient responsible charge related to this service. The patient expressed understanding and agreed to proceed.  Pt location: Home Physician Location: office Name of referring provider:  Rosalin Hawking, MD I connected with Francesca Jewett at patients initiation/request on 10/21/2018 at  8:30 AM EDT by video enabled telemedicine application and verified that I am speaking with the correct person using two identifiers. Pt MRN:  [id number] Pt DOB:  August 08, 1965 Video Participants:  Francesca Jewett   History of Present Illness:  Janet [name] is a 53 year old right-handed woman with CAD, HTN, hyperlipidiemia, OSA, asthma, arthritis, depression, NASH and liver hemangioma and history of migraines whom I previously saw for TIA and migraines, last in 2018, presents for recent hospitalization for TIA.  UPDATE: She was admitted to [facility] on 10/13/18 for numbness and paresthesia of left hand and left side of her lower face.  No associated weakness.  Lasted several hours.  No associated headache.  CT of head was negative for acute stroke.  CTA of head and neck showed mild bilateral ICA atherosclerosis but no large vessel occlusion or stenosis.  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  MRI of brain showed chronic small vessel ischemic changes and old left cerebellar infarct but no acute stroke.  2D echo showed EF 60-65% with no source of embolus.  LDL was 162.  Hgb  A1c was 5.8.  Previous to hospitalization, she had been on ASA 10m and Plavix 759mdaily and was advised to remain on dual antiplatelet therapy.  She was advised to continue Repatha which she had just started 1 to 2 weeks prior to hospitalization.  She was discharged on nicotine patch.  Complicated migraine suspected.  Since Repatha was ineffective, it was discontinued.  Her PCP has her on Crestor once a week.  HISTORY: She had a TIA in March 2014, presenting as left sided numbness of her head and face.  Carotid doppler performed on 08/12/12 showed no hemodynamically significant ICA stenosis.  She was started on ASA afterwards.    In April 2017, she was riding in the car with her husband when suddenly her left hand became numb with weakness and wrist drop.  It lasted just a few seconds but she also had left perioral numbness lasting several hours.  She denied neck pain, facial weakness, or slurred speech.  She followed up with her PCP, who started her on Plavix.  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  2D echo from 08/26/15 showed EF 60-65% with no cardiac source of emboli.  Carotid doppler showed moderate to large amount of atherosclerotic plaque in both ICAs, but without hemodynamically significant stenosis.  Hgb A1c from 08/16/15 was 5.8 and fasting lipid panel showed total cholesterol was 287, TG 185, HDL 63 and LDL 187.  She was started on Livalo  68m every other day.  Repeat fasting lipid panel from 09/29/15 showed LDL decreased to 125.    In September 2017, she had an episode of left sided vision loss in both eyes, lasting for about 20 seconds, consistent with a TIA presenting as left homonymous hemianopia.  She denies any other symptoms, such as slurred speech, focal weakness, focal numbness or headache.  LDL from 02/09/16 was 147, so she was switched  from Livalo to Lipitor 864mdaily.  MRI of brain from 02/17/16 was personally reviewed was negative for acute infarct.  MRA of head showed no large or medium intracranial stenosis or occlusion.  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  It also revealed ectatic ascending aorta.  Repeat CTA of neck from 07/19/16 was stable.  She was referred to cardiothoracic surgery, where arteriogram and possible stenting was considered.  However, since she was asymptomatic and the scan from March was stable compared to November, it was decided to hold off on intervention and monitor.  She also had a cardiac monitor which did not reveal any obvious arrhythmias.  In the past, she was unable to tolerate Aggrenox and statins.  She has had ocular migraines lasting seconds.  One involves only the right eye, in which she sees a crescent shape with triangles in her visual field.  The other involves flashing moving lights in both eyes.  There is no associated headache.  In 2018, she started to have increased migraines.  They start in the back of the neck and are holocephalic, throbbing.  Usually moderate intensity but sometimes severe.  The headache is constant but has a severe headache every 2 to 3 days.  She has a severe migraine headache associated with nausea, photophobia and phonophobia every 7 to 10 days.  She uses Stadol.  Otherwise, she uses over the counter ibuprofen daily.  She reports stress, so that is a trigger.  Rest and Stadol help.  She was started on venlafaxine and subsequently discontinued it after migraines resolved.   She  has prior neck surgery with C3 to C5 fusions, however she denies neck pain or radicular pain down the arms.  She does report numbness in the hands when she goes to sleep, in which she needs to shake it out.  Past Medical History: Past Medical History:  Diagnosis Date   Adenoma of right adrenal gland    Benign   Allergy    SEASONAL   Anemia    Anxiety    Arthritis    Asthma    Barrett's esophagus 02/09/2014   CAD (coronary artery disease)    a. prior MI in 2005 with 60-70% dLAD stenosis and 60-70% D2 stenosis (concern for coronary vasospasm)   Depression    Focal nodular hyperplasia of liver    GERD (gastroesophageal reflux disease)    Heart murmur    AS A CHILD   History of adenomatous polyp of colon 02/09/2014   HLD (hyperlipidemia)    HTN (hypertension)    Liver hemangioma    Migraine headache    Myocardial infarction ([facility]   NASH (nonalcoholic steatohepatitis)  [place]   OSA (obstructive sleep apnea)    ahi 21.7   Palpitations    Sinus complaint    Stroke [facility])    Unstable angina ([place]) 05/15/2017    Medications: Outpatient Encounter Medications as of 10/21/2018  Medication Sig   ALPRAZolam (XANAX) 0.25 MG tablet TAKE 2 TABLETS (0.5MG) BY MOUTH AT BEDTIME AS NEEDED FOR ANXIETY.   aspirin EC 81 MG tablet Take 81 mg by mouth daily.   benazepril (LOTENSIN) 20 MG tablet TAKE 1 TABLET BY MOUTH EVERY DAY   butorphanol (STADOL) 10 MG/ML nasal spray INSTILL 1 SPRAY INTO ONE NOSTRIL ONCE DAILY IF NEEDED   Cetirizine HCl (ZYRTEC PO) Take 1 tablet by mouth daily.   clopidogrel (PLAVIX) 75 MG tablet Take 1 tablet (75 mg total) by mouth daily.   doxazosin (CARDURA) 4 MG tablet Take 4 mg by mouth daily.   Hawthorn Berry 565 MG CAPS Take 1 capsule by mouth daily.   hydrochlorothiazide (HYDRODIURIL) 25 MG tablet Take 1 tablet (25 mg total) by mouth daily. Stop amlodipine   HYDROcodone-acetaminophen (NORCO/VICODIN) 5-325 MG tablet Take 1 tablet by  mouth every 6 (six) hours as needed for moderate pain.   ibuprofen (ADVIL,MOTRIN) 200 MG tablet Take 400 mg by mouth 2 (two) times daily as needed for headache or moderate pain.    metoprolol succinate (TOPROL XL) 25 MG 24 hr tablet Take 0.5 tablets (12.5 mg total) by mouth 2 (two) times daily.   nitroGLYCERIN (NITROSTAT) 0.4 MG SL tablet Place 1 tablet (0.4 mg total) under the tongue every 5 (five) minutes as needed for chest pain.   pantoprazole (PROTONIX) 40 MG tablet TAKE 1 TABLET BY MOUTH EVERY DAY BEFORE BREAKFAST   POTASSIUM PO Take 1 tablet by mouth daily.   rosuvastatin (CRESTOR) 5 MG tablet Take 1 tablet (5 mg total) by mouth daily.   No facility-administered encounter medications on file as of 10/21/2018.     Allergies: Allergies  Allergen Reactions   Aggrenox [Aspirin-Dipyridamole Er] Tinitus   Atorvastatin Other (See Comments)    Myalgias    Codeine Itching and Other (See Comments)    Nose itches   Imitrex [Sumatriptan Base] Swelling and Other (See Comments)    Throat swelling   Isosorbide Nitrate Other (See Comments)    headaches   Other Other (See Comments)    Artificial sweeteners cause heart palpitations (stevia is ok)   Quinolones     Patient was warned about not using Cipro and similar antibiotics. Recent studies have raised concern that fluoroquinolone antibiotics could be associated with an increased risk of aortic aneurysm Fluoroquinolones have non-antimicrobial properties that might jeopardise the integrity of the extracellular matrix of the vascular wall In a  propensity score matched cohort study in [place], there was a 66% increased rate of aortic aneurysm or dissection associated with oral fluoroquinolone use, compared wit   Rosuvastatin Other (See Comments)    Restless legs   Simvastatin Other (See Comments)    Myalgias    Family History: Family History  Problem Relation Age of Onset   Coronary artery disease Father    Aneurysm  Father        AAA   Melanoma Father    Transient ischemic attack Father    Heart disease Father    AAA (abdominal aortic aneurysm) Father    Aneurysm Mother        AAA   Colon polyps Mother    Heart disease Mother    Irritable bowel syndrome  Mother    Diverticulitis Mother    Other Mother        Evlyn Clines   AAA (abdominal aortic aneurysm) Mother    Brain cancer Mother    Breast cancer Paternal Aunt    Liver disease Maternal Uncle    Heart disease Brother    Prostate cancer Paternal Uncle    Mesothelioma Paternal Uncle    Colon cancer Neg Hx     Social History: Social History   Socioeconomic History   Marital status: Married    Spouse name: Not on file   Number of children: 1   Years of education: Not on file   Highest education level: Not on file  Occupational History   Occupation: MECHANICAL DESIGNER    Employer: [employer] resource strain: Not on file   Food insecurity    Worry: Not on file    Inability: Not on file   Transportation needs    Medical: Not on file    Non-medical: Not on file  Tobacco Use   Smoking status: Current Every Day Smoker    Packs/day: 0.25    Years: 30.00    Pack years: 7.50    Types: Cigarettes   Smokeless tobacco: Never Used   Tobacco comment: restarted 3 weeks ago  Substance and Sexual Activity   Alcohol use: Yes    Comment: occasional; 1 per day   Drug use: No   Sexual activity: Not on file  Lifestyle   Physical activity    Days per week: Not on file    Minutes per session: Not on file   Stress: Not on file  Relationships   Social connections    Talks on phone: Not on file    Gets together: Not on file    Attends religious service: Not on file    Active member of club or organization: Not on file    Attends meetings of clubs or organizations: Not on file    Relationship status: Not on file   Intimate partner violence    Fear of current or ex  partner: Not on file    Emotionally abused: Not on file    Physically abused: Not on file    Forced sexual activity: Not on file  Other Topics Concern   Not on file  Social History Narrative   She is married with 1 son   She is a Tax inspector at a factory   Former smoker   Alcohol is used at least once a week glass of wine   Denies drug use   Observations/Objective:   Height 5' 8"  (1.727 m), weight 173 lb (78.5 kg). No acute distress.  Alert and oriented.  Speech fluent and not dysarthric.  Language intact.    Assessment and Plan:   1.  Cheiro-oral Syndrome.  Migraine possible but I feel TIA is a significant consideration. 2.  Migraine without aura, without status migrainosus, not intractable 3.  Ocular migraines 4.  Cerebrovascular disease 5.  Hypertension 6.  Hyperlipidemia 7.  Tobacco use disorder 8.  Coronary artery disease 9.  Obstructive sleep apnea  1.  Continue dual antiplatelet therapy 2.  Crestor (LDL goal less than 70) 3.  Blood pressure control 4.  Smoking cessation 5.  Mediterranean diet and exercise 6.  Follow up in 4 months.  Follow Up Instructions:    -I discussed the assessment and treatment plan with the patient. The patient was provided an opportunity to  ask questions and all were answered. The patient agreed with the plan and demonstrated an understanding of the instructions.   The patient was advised to call back or seek an in-person evaluation if the symptoms worsen or if the condition fails to improve as anticipated.    Total Time spent in visit with the patient was:  15 minutes  Dudley Major, DO

## 2018-10-21 ENCOUNTER — Telehealth (INDEPENDENT_AMBULATORY_CARE_PROVIDER_SITE_OTHER): Payer: BC Managed Care – PPO | Admitting: Neurology

## 2018-10-21 ENCOUNTER — Other Ambulatory Visit: Payer: Self-pay | Admitting: Family Medicine

## 2018-10-21 ENCOUNTER — Encounter: Payer: Self-pay | Admitting: Neurology

## 2018-10-21 ENCOUNTER — Other Ambulatory Visit: Payer: Self-pay

## 2018-10-21 VITALS — Ht 68.0 in | Wt 173.0 lb

## 2018-10-21 DIAGNOSIS — Z720 Tobacco use: Secondary | ICD-10-CM

## 2018-10-21 DIAGNOSIS — E785 Hyperlipidemia, unspecified: Secondary | ICD-10-CM

## 2018-10-21 DIAGNOSIS — I2510 Atherosclerotic heart disease of native coronary artery without angina pectoris: Secondary | ICD-10-CM

## 2018-10-21 DIAGNOSIS — G459 Transient cerebral ischemic attack, unspecified: Secondary | ICD-10-CM | POA: Diagnosis not present

## 2018-10-21 DIAGNOSIS — G43009 Migraine without aura, not intractable, without status migrainosus: Secondary | ICD-10-CM

## 2018-10-21 DIAGNOSIS — I10 Essential (primary) hypertension: Secondary | ICD-10-CM

## 2018-11-15 ENCOUNTER — Other Ambulatory Visit: Payer: Self-pay | Admitting: Family Medicine

## 2018-11-18 ENCOUNTER — Encounter: Payer: Self-pay | Admitting: Family Medicine

## 2018-11-27 ENCOUNTER — Encounter: Payer: Self-pay | Admitting: Family Medicine

## 2018-11-28 ENCOUNTER — Other Ambulatory Visit: Payer: Self-pay | Admitting: Family Medicine

## 2018-11-28 MED ORDER — DOXAZOSIN MESYLATE 4 MG PO TABS: 4.0000 mg | ORAL_TABLET | Freq: Every day | ORAL | 3 refills | Status: DC

## 2018-11-28 MED ORDER — ESCITALOPRAM OXALATE 10 MG PO TABS: 10.0000 mg | ORAL_TABLET | Freq: Every day | ORAL | 3 refills | Status: DC

## 2018-12-03 ENCOUNTER — Other Ambulatory Visit: Payer: Self-pay | Admitting: Family Medicine

## 2018-12-03 MED ORDER — DOXAZOSIN MESYLATE 4 MG PO TABS: 4.0000 mg | ORAL_TABLET | Freq: Every day | ORAL | 3 refills | Status: DC

## 2018-12-20 ENCOUNTER — Other Ambulatory Visit: Payer: Self-pay | Admitting: Family Medicine

## 2018-12-29 ENCOUNTER — Inpatient Hospital Stay: Payer: Self-pay

## 2018-12-29 ENCOUNTER — Inpatient Hospital Stay: Payer: Self-pay | Attending: Oncology

## 2019-01-06 ENCOUNTER — Encounter: Payer: Self-pay | Admitting: Adult Health

## 2019-01-08 ENCOUNTER — Other Ambulatory Visit: Payer: Self-pay

## 2019-01-08 ENCOUNTER — Encounter: Payer: Self-pay | Admitting: Adult Health

## 2019-01-08 ENCOUNTER — Ambulatory Visit: Payer: BC Managed Care – PPO | Admitting: Adult Health

## 2019-01-08 VITALS — BP 134/102 | HR 64 | Temp 97.7°F | Ht 68.0 in | Wt 178.0 lb

## 2019-01-08 DIAGNOSIS — G4733 Obstructive sleep apnea (adult) (pediatric): Secondary | ICD-10-CM

## 2019-01-08 DIAGNOSIS — Z9989 Dependence on other enabling machines and devices: Secondary | ICD-10-CM

## 2019-01-08 NOTE — Patient Instructions (Signed)
Continue using CPAP nightly and greater than 4 hours each night °If your symptoms worsen or you develop new symptoms please let us know.  ° °

## 2019-01-08 NOTE — Progress Notes (Signed)
PATIENT: Janet [name] DOB: Sep 04, 1965  REASON FOR VISIT: follow up HISTORY FROM: patient  HISTORY OF PRESENT ILLNESS: Today 01/08/19:  Janet [name] is a 53 year old female with a history of obstructive sleep apnea on CPAP.  She returns today for follow-up.  Her download indicates that she used her machine 26 out of 30 days for compliance of 87%.  She used her machine greater than 4 hours 22 days for compliance of 73%.  On average she uses her machine 6 hours and 45 minutes.  Her residual AHI is 0.8 on 5 to 15 cm of water with EPR of 3.  Her leak in the 95th percentile is 18.3 L/min.  She states that she denies machine last week because she went to the beach and forgot to take it.  She has not seen a big change in his using the CPAP.  She questions whether she even has sleep apnea.  HISTORY  3/3/2020CM Janet [name], 53 year old female returns for follow-up with history of severe obstructive sleep apnea newly diagnosed here for initial CPAP compliance.  She is doing fine with his machine.  Daytime drowsiness has improved.  Compliance data dated 06/01/2018-06/30/2018 shows compliance greater than 4 hours at 90%.  Average usage 5 hours 41 minutes.  Set pressure 5 to 15 cm.  EPR level 3 leak 95th percentile 13.2.  AHI 0.5 ESS 1.  She returns for reevaluation   10/22/19CD Janet [name] a 53 y.o.femalepatient, seen hereon 02-18-2018 uponreferral from Dr.Pickardfor evaluation of polycythemia, an possible sleep apnea or sleep hypoxemea.Janet [name] diagnosed with polycythemia in April of this year, at the time she had successfully quit smoking but soon after relapsed. She is currently smoking half pack per day. She feels very fatigued but reports she sleeps well, "hard". She is losing hair and reports unintentional weight loss.  Last Colonoscopy was 3-4 years ago by Dr. Carlean Purl, and an upper endoscopy was performed in 2018 - all normal. She stopped Protonix recently.  She has CAD at  age 57 year, and has had an MI at age 73, she has had strokes , followed by Dr Loretta Plume.   Sleep habits are as follows:The patient reports that she is having a regular bedtime around 10 PM, goes to bed and falls asleep promptly. She usually can stay asleep several hours -5 -until a bathroom call wakes her. She is not aware of snoring, she has not been told that she has apnea. Her husband actually has confirmed to her that she does not snore when she asked. Her bedroom is described as cool, quiet and dark and she shares a bedroom with her husband, she stopped off sleeping prone but ends up waking when she is supine. She sleeps on one pillow only. The bed is flat, nonadjustable. She rises at 8 am. One nocturia. Wakes up refreshed - no dry mouth, no headaches, no dizziness.  REVIEW OF SYSTEMS: Out of a complete 14 system review of symptoms, the patient complains only of the following symptoms, and all other reviewed systems are negative.  Epworth sleepiness score 0   ALLERGIES: Allergies  Allergen Reactions  . Aggrenox [Aspirin-Dipyridamole Er] Tinitus  . Atorvastatin Other (See Comments)    Myalgias   . Codeine Itching and Other (See Comments)    Nose itches  . Imitrex [Sumatriptan Base] Swelling and Other (See Comments)    Throat swelling  . Isosorbide Nitrate Other (See Comments)    headaches  . Other Other (See Comments)  Artificial sweeteners cause heart palpitations (stevia is ok)  . Quinolones     Patient was warned about not using Cipro and similar antibiotics. Recent studies have raised concern that fluoroquinolone antibiotics could be associated with an increased risk of aortic aneurysm Fluoroquinolones have non-antimicrobial properties that might jeopardise the integrity of the extracellular matrix of the vascular wall In a  propensity score matched cohort study in [place], there was a 66% increased rate of aortic aneurysm or dissection associated with oral  fluoroquinolone use, compared wit  . Rosuvastatin Other (See Comments)    Restless legs  . Simvastatin Other (See Comments)    Myalgias    HOME MEDICATIONS: Outpatient Medications Prior to Visit  Medication Sig Dispense Refill  . ALPRAZolam (XANAX) 0.25 MG tablet TAKE 2 TABLETS (0.5MG) BY MOUTH AT BEDTIME AS NEEDED FOR ANXIETY. 60 tablet 0  . amLODipine (NORVASC) 10 MG tablet TAKE 1 TABLET BY MOUTH EVERY DAY 90 tablet 1  . aspirin EC 81 MG tablet Take 81 mg by mouth daily.    . benazepril (LOTENSIN) 20 MG tablet TAKE 1 TABLET BY MOUTH EVERY DAY 90 tablet 2  . butorphanol (STADOL) 10 MG/ML nasal spray INSTILL 1 SPRAY INTO ONE NOSTRIL ONCE DAILY IF NEEDED 2.5 mL 0  . Cetirizine HCl (ZYRTEC PO) Take 1 tablet by mouth daily.    . clopidogrel (PLAVIX) 75 MG tablet Take 1 tablet (75 mg total) by mouth daily. 90 tablet 2  . doxazosin (CARDURA) 4 MG tablet Take 1 tablet (4 mg total) by mouth daily. 90 tablet 3  . escitalopram (LEXAPRO) 10 MG tablet TAKE 1 TABLET BY MOUTH EVERY DAY 90 tablet 2  . Hawthorn Berry 565 MG CAPS Take 1 capsule by mouth daily.    . hydrochlorothiazide (HYDRODIURIL) 25 MG tablet Take 1 tablet (25 mg total) by mouth daily. Stop amlodipine 90 tablet 3  . HYDROcodone-acetaminophen (NORCO/VICODIN) 5-325 MG tablet Take 1 tablet by mouth every 6 (six) hours as needed for moderate pain. 30 tablet 0  . ibuprofen (ADVIL,MOTRIN) 200 MG tablet Take 400 mg by mouth 2 (two) times daily as needed for headache or moderate pain.     . metoprolol succinate (TOPROL XL) 25 MG 24 hr tablet Take 0.5 tablets (12.5 mg total) by mouth 2 (two) times daily. 30 tablet 6  . nitroGLYCERIN (NITROSTAT) 0.4 MG SL tablet Place 1 tablet (0.4 mg total) under the tongue every 5 (five) minutes as needed for chest pain. 25 tablet 3  . pantoprazole (PROTONIX) 40 MG tablet TAKE 1 TABLET BY MOUTH EVERY DAY BEFORE BREAKFAST 90 tablet 0  . POTASSIUM PO Take 1 tablet by mouth daily.    . rosuvastatin (CRESTOR) 5 MG  tablet Take 1 tablet (5 mg total) by mouth daily. 90 tablet 3   No facility-administered medications prior to visit.     PAST MEDICAL HISTORY: Past Medical History:  Diagnosis Date  . Adenoma of right adrenal gland    Benign  . Allergy    SEASONAL  . Anemia   . Anxiety   . Arthritis   . Asthma   . Barrett's esophagus 02/09/2014  . CAD (coronary artery disease)    a. prior MI in 2005 with 60-70% dLAD stenosis and 60-70% D2 stenosis (concern for coronary vasospasm)  . Depression   . Focal nodular hyperplasia of liver   . GERD (gastroesophageal reflux disease)   . Heart murmur    AS A CHILD  . History of adenomatous polyp of  colon 02/09/2014  . HLD (hyperlipidemia)   . HTN (hypertension)   . Liver hemangioma   . Migraine headache   . Myocardial infarction ([place])   . NASH (nonalcoholic steatohepatitis)    nash  . OSA (obstructive sleep apnea)    ahi 21.7  . Palpitations   . Sinus complaint   . Stroke ([place])   . Unstable angina ([place]) 05/15/2017    PAST SURGICAL HISTORY: Past Surgical History:  Procedure Laterality Date  . ABDOMINOPLASTY    . BLADDER SUSPENSION    . CERVICAL FUSION     C4, 5, 6  . COLONOSCOPY W/ BIOPSIES    . ESOPHAGOGASTRODUODENOSCOPY    . LEFT HEART CATH AND CORONARY ANGIOGRAPHY N/A 05/16/2017   Procedure: LEFT HEART CATH AND CORONARY ANGIOGRAPHY;  Surgeon: Troy Sine, MD;  Location: [facility];  Service: Cardiovascular;  Laterality: N/A;  . TONSILLECTOMY    . TUBAL LIGATION      FAMILY HISTORY: Family History  Problem Relation Age of Onset  . Coronary artery disease Father   . Aneurysm Father        AAA  . Melanoma Father   . Transient ischemic attack Father   . Heart disease Father   . AAA (abdominal aortic aneurysm) Father   . Aneurysm Mother        AAA  . Colon polyps Mother   . Heart disease Mother   . Irritable bowel syndrome Mother   . Diverticulitis Mother   . Other Mother        Evlyn Clines  . AAA (abdominal  aortic aneurysm) Mother   . Brain cancer Mother   . Breast cancer Paternal Aunt   . Liver disease Maternal Uncle   . Heart disease Brother   . Prostate cancer Paternal Uncle   . Mesothelioma Paternal Uncle   . Colon cancer Neg Hx     SOCIAL HISTORY: Social History   Socioeconomic History  . Marital status: Married    Spouse name: Not on file  . Number of children: 1  . Years of education: Not on file  . Highest education level: Not on file  Occupational History  . Occupation: MECHANICAL DESIGNER    Employer: [employer]  Social Needs  . Financial resource strain: Not on file  . Food insecurity    Worry: Not on file    Inability: Not on file  . Transportation needs    Medical: Not on file    Non-medical: Not on file  Tobacco Use  . Smoking status: Current Every Day Smoker    Packs/day: 0.25    Years: 30.00    Pack years: 7.50    Types: Cigarettes  . Smokeless tobacco: Never Used  . Tobacco comment: restarted 3 weeks ago  Substance and Sexual Activity  . Alcohol use: Yes    Comment: occasional; 1 per day  . Drug use: No  . Sexual activity: Not on file  Lifestyle  . Physical activity    Days per week: Not on file    Minutes per session: Not on file  . Stress: Not on file  Relationships  . Social Herbalist on phone: Not on file    Gets together: Not on file    Attends religious service: Not on file    Active member of club or organization: Not on file    Attends meetings of clubs or organizations: Not on file    Relationship status: Not on  file  . Intimate partner violence    Fear of current or ex partner: Not on file    Emotionally abused: Not on file    Physically abused: Not on file    Forced sexual activity: Not on file  Other Topics Concern  . Not on file  Social History Narrative   She is married with 1 son   She is a Tax inspector at a factory   Former smoker   Alcohol is used at least once a week glass of wine   Denies drug  use      PHYSICAL EXAM  Vitals:   01/08/19 1405  BP: (!) 134/102  Pulse: 64  Temp: 97.7 F (36.5 C)  Weight: 178 lb (80.7 kg)  Height: 5' 8"  (1.727 m)   Body mass index is 27.06 kg/m.  Generalized: Well developed, in no acute distress  Chest: Lungs clear to auscultation bilaterally  Neurological examination  Mentation: Alert oriented to time, place, history taking. Follows all commands speech and language fluent Cranial nerve II-XII:Extraocular movements were full, visual field were full on confrontational test.  Head turning and shoulder shrug  were normal and symmetric. Motor: The motor testing reveals 5 over 5 strength of all 4 extremities. Good symmetric motor tone is noted throughout.  Sensory: Sensory testing is intact to soft touch on all 4 extremities. No evidence of extinction is noted.  Gait and station: Gait is normal.     DIAGNOSTIC DATA (LABS, IMAGING, TESTING) - I reviewed patient records, labs, notes, testing and imaging myself where available.  Lab Results  Component Value Date   WBC 9.2 10/14/2018   HGB 16.1 (H) 10/14/2018   HCT 48.7 (H) 10/14/2018   MCV 89.2 10/14/2018   PLT 271 10/14/2018      Component Value Date/Time   NA 139 10/13/2018 1259   NA 141 06/04/2017 1135   K 4.0 10/13/2018 1259   CL 101 10/13/2018 1259   CO2 25 10/13/2018 1259   GLUCOSE 110 (H) 10/13/2018 1259   BUN 7 10/13/2018 1259   BUN 10 06/04/2017 1135   CREATININE 0.70 10/13/2018 1259   CREATININE 0.72 09/09/2018 1113   CALCIUM 10.1 10/13/2018 1259   PROT 7.2 10/13/2018 1259   PROT 7.5 11/19/2017 1138   ALBUMIN 4.0 10/13/2018 1259   ALBUMIN 4.8 11/19/2017 1138   AST 66 (H) 10/13/2018 1259   ALT 92 (H) 10/13/2018 1259   ALKPHOS 74 10/13/2018 1259   BILITOT 0.9 10/13/2018 1259   BILITOT 0.6 11/19/2017 1138   GFRNONAA >60 10/13/2018 1259   GFRNONAA 94 09/09/2018 1113   GFRAA >60 10/13/2018 1259   GFRAA 109 09/09/2018 1113   Lab Results  Component Value Date    CHOL 276 (H) 10/14/2018   HDL 52 10/14/2018   LDLCALC 162 (H) 10/14/2018   LDLDIRECT 206 (H) 05/23/2018   TRIG 309 (H) 10/14/2018   CHOLHDL 5.3 10/14/2018   Lab Results  Component Value Date   HGBA1C 5.8 (H) 10/14/2018   Lab Results  Component Value Date   VITAMINB12 615 02/04/2018   Lab Results  Component Value Date   TSH 3.64 08/13/2017      ASSESSMENT AND PLAN 53 y.o. year old female  has a past medical history of Adenoma of right adrenal gland, Allergy, Anemia, Anxiety, Arthritis, Asthma, Barrett's esophagus (02/09/2014), CAD (coronary artery disease), Depression, Focal nodular hyperplasia of liver, GERD (gastroesophageal reflux disease), Heart murmur, History of adenomatous polyp of colon (02/09/2014), HLD (hyperlipidemia), HTN (  hypertension), Liver hemangioma, Migraine headache, Myocardial infarction ([place]), NASH (nonalcoholic steatohepatitis), OSA (obstructive sleep apnea), Palpitations, Sinus complaint, Stroke [facility] and Unstable angina ([place]) (05/15/2017). here wit:  1.  Obstructive sleep apnea on CPAP  Patient CPAP download shows excellent compliance and good treatment of her apnea.  She is encouraged to continue using CPAP nightly and greater than 4 hours each night.  I reviewed the patient's sleep study with her.  Also reviewed risk associated with untreated sleep apnea.  She voiced understanding.  She is advised that if her symptoms worsen or she develops new symptoms she should let us know.  She will follow-up in 1 year or sooner if needed    I spent 15 minutes with the patient. 50% of this time was spent reviewing CPAP download   Ward Givens, MSN, NP-C 01/08/2019, 11:45 AM [facility] [address] [place], [place] [address] [phone]

## 2019-01-18 ENCOUNTER — Encounter: Payer: Self-pay | Admitting: Internal Medicine

## 2019-01-21 ENCOUNTER — Other Ambulatory Visit: Payer: Self-pay | Admitting: Family Medicine

## 2019-01-22 NOTE — Telephone Encounter (Signed)
Last office visit: 09/09/2018 Last refilled: 05/16/2017

## 2019-02-09 ENCOUNTER — Telehealth: Payer: Self-pay | Admitting: Internal Medicine

## 2019-02-09 NOTE — Telephone Encounter (Signed)
New message   Pt c/o Syncope: STAT if syncope occurred within 30 minutes and pt complains of lightheadedness High Priority if episode of passing out, completely, today or in last 24 hours   1. Did you pass out today?no   2. When is the last time you passed out? Patient states that she passed on 2019-02-14  3. Has this occurred multiple times? No   4. Did you have any symptoms prior to passing out?mild chest pain, dizziness

## 2019-02-09 NOTE — Telephone Encounter (Signed)
Spoke to pt regarding passing out episode. Stated that she stood up and fell over onto a bucket and hurt her back. Asked the pt how she was feeling prior to passing out, denied symptoms. Pt states that she recently has had stabbing and squeezing chest pain that comes and goes. Asked the pt if she was taking her medication as prescribed, she agreed. Asked the pt how long the pain lasted, stated only a minute or so. Advised pt that she should have called 911 when she lost consciousness. Pt also states that she has been having high BP's. Last BP 10/12 AM was 163/118. Educated pt that the BP was too high and to start taking her BP twice daily and call us back if she continues to be hypertensive. Also educated the pt that if loses consciousness again or has chest pain that does not subside to call 911.  Made appt with Dr. Debara Pickett for 10/15 @ 3pm. Will route to Dr. Debara Pickett for advice.

## 2019-02-10 NOTE — Telephone Encounter (Signed)
Thanks .. agree with recommendations. Will d/w her on 10/15.  Dr Lemmie Evens

## 2019-02-12 ENCOUNTER — Encounter: Payer: Self-pay | Admitting: Internal Medicine

## 2019-02-12 ENCOUNTER — Other Ambulatory Visit: Payer: Self-pay

## 2019-02-12 ENCOUNTER — Ambulatory Visit: Payer: BC Managed Care – PPO | Admitting: Internal Medicine

## 2019-02-12 VITALS — BP 161/106 | HR 67 | Temp 97.3°F | Ht 68.0 in | Wt 178.0 lb

## 2019-02-12 DIAGNOSIS — R55 Syncope and collapse: Secondary | ICD-10-CM

## 2019-02-12 DIAGNOSIS — E7849 Other hyperlipidemia: Secondary | ICD-10-CM | POA: Diagnosis not present

## 2019-02-12 DIAGNOSIS — I2510 Atherosclerotic heart disease of native coronary artery without angina pectoris: Secondary | ICD-10-CM

## 2019-02-12 DIAGNOSIS — R079 Chest pain, unspecified: Secondary | ICD-10-CM | POA: Diagnosis not present

## 2019-02-12 MED ORDER — CARVEDILOL 6.25 MG PO TABS: 6.2500 mg | ORAL_TABLET | Freq: Two times a day (BID) | ORAL | 3 refills | Status: AC

## 2019-02-12 NOTE — Progress Notes (Signed)
OFFICE NOTE  Chief Complaint:  Follow-up dyslipidemia  Primary Care Physician: Susy Frizzle, MD  HPI:  Janet [name] is a 53 y.o. female with a history of moderate coronary artery disease and MI in 2005, at which time she was noted to have a 60-70% distal LAD stenosis and a 60-70% ostial D1 stenosis on cath. There is no clear culprit lesion and she did not receive a stent. She was thought to have vasospasm. Since then she's had numerous stress tests without any recurrent ischemia and she denies any recurrent chest pain. She has had ongoing problems with TIA and has been on and off of Plavix. Mostly she notes visual field cuts as well as arm and facial numbness. She has been seen by Dr. Tomi Likens with neurology and had an echocardiogram this past year which showed an EF of 60-65%, mild LVH, mild AI and mild TR. A bubble study was not performed. She's also had bilateral carotid artery ultrasounds which showed moderate to large amount of atherosclerotic plaque on the right greater than left without hemodynamically significant stenosis. 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 She was then referred to Dr. Servando Snare with thoracic surgery. It was also noted that she had multiple subcentimeter pulmonary nodules which require follow-up. There are no plans for surgery at this time. She was placed on a monitor which demonstrated no obvious arrhythmias or atrial fibrillation. She has a history of statin intolerance in the past. She is previously been on Lipitor, Crestor, Zocor, Pravachol and is currently tolerating Livalo, however, LDL-C on 02/09/2016 was 147, which represents poor control given her high carotid artery plaque burden and history of coronary artery disease (ASCVD).   04/18/2017  Janet [name] returns today for follow-up of her dyslipidemia and coronary disease.  Fortunately she has had no further TIA events this year.   In January she underwent echo with bubble study which was negative for PFO.  She also had carotid Dopplers which were normal.  She does have ASCVD and is been statin intolerant in the past.  Given her prior MI and LAD and diagonal stenosis on catheterization, we recommended aggressive medical therapy with a goal LDL less than 70.  She was recently started on Praluent, however her LDL was not improved.  Subsequently it seems that she was taken off the medication due to elevated liver enzymes.  She does have a history of nonalcoholic fatty liver disease.  Her AST and ALT at baseline were 39 and 60, but increased to 125 and 237 on medication.  Praluent was discontinued and her AST return to 61 with ALT 116.  She was asymptomatic with this.  05/15/2017  Janet [name] was seen today as an add-on for acute chest pain.  This was not much of an issue in December when I saw her however over the past 2 weeks she has had some progressive chest discomfort.  She is required nitroglycerin.  She feels as a squeezing pain in the center of her chest which can occur with exertion or at rest.  She said the pain radiates up to her jaw and into her teeth.  She takes nitroglycerin and does have some relief.  This past week she has had to take for 5 nitroglycerin and is concerned about coronary disease.  We repeated her EKG today which demonstrates normal sinus rhythm and inferior and lateral ischemic changes.  This was present in  her EKG in December however may be somewhat worse today.  These changes have been somewhat stable and were considered repolarization abnormalities.  07/25/2017  Janet [name] was recently seen by our advanced practice providers for symptoms of chest pain, but her symptoms so included palpitations.  She wore a monitor which showed some paroxysmal atrial tachycardia.  Short runs less than 10 beats.  It is notable that her heart rate varies.  In February heart rate was in the 80s and in January in the 70s.  Today  her heart rates in the low 50s, but I still think there is room for her to potentially be on a beta-blocker.  In addition we discussed her dyslipidemia.  Unfortunately she has been intolerant to statins and had had no response to both PCSK9 inhibitors.  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  I offered genetic testing however due to cost issues he declined.  She tells me that she has a significant family history of dyslipidemia and her brother recently started on Repatha.  I suspect he may not have improvement either.  We discussed another alternative, namely Lomitapide.  This would have to be used very cautiously given her history of abnormal liver enzymes in the past, but could significantly decrease her cholesterol by an alternative mechanism.  She wishes to discuss this further.  In the meantime I would recommend we try her on ezetimibe.  She does not have a history of trying that before.  It was ordered but apparently was too expensive when it was previously non-generic.  11/19/2017  Mrs. Sheperd returns today for follow-up.  Overall she is doing well denies any recurrent palpitations or chest pain.  Heart rates in the 80s today with normal blood pressure.  We have also been seeing her in the lipid clinic for management of dyslipidemia.  She has been intolerant to statins and was a PCSK9 nonresponder.  It was thought that she might have LDL receptor mutation however genetic testing was not performed.  We discussed starting ezetimibe which she has been taking.  She reports daily fatigue but cannot directly attributed to this medicine.  We have not yet reassessed her lipid profile.  05/22/2018  Janet [name] is seen today in follow-up of her dyslipidemia.  Overall she feels better after recently her beta-blocker was increased by Almyra Deforest, PA-C for tachycardia and history of presyncope.  She denies chest pain or worsening shortness of breath.  There is no new edema.  Blood pressure is better controlled.   She still complains of fatigue without clear etiology of those symptoms.  She has not had a recent repeat lipid profile and cholesterol remains above goal.  She had tried ezetimibe but feels that it is not well tolerated.  02/12/2019  Janet [name] is seen today in follow-up.  Recently she had an unexplained syncopal episode.  She says she was with family around a campfire and was sitting down.  She stood up to get something and then apparently passed out.  She had no recollection of it other than waking up on the ground and finding some people around her.  She denies any preceding chest pain or palpitations.  She says however recently she has been having some chest pain symptoms and does have a remote history of coronary disease.  Obviously she has significant dyslipidemia.  She also has history of arrhythmias, all of which could have caused her syncopal event.  Blood pressure is noted to be elevated today.  Recently  she had been taken off of her amlodipine because she felt like it was causing her right shoulder pain.  To the point where she was going to get injections in her shoulder but after stopping the amlodipine her symptoms improved.  PMHx:  Past Medical History:  Diagnosis Date   Adenoma of right adrenal gland    Benign   Allergy    SEASONAL   Anemia    Anxiety    Arthritis    Asthma    Barrett's esophagus 02/09/2014   CAD (coronary artery disease)    a. prior MI in 2005 with 60-70% dLAD stenosis and 60-70% D2 stenosis (concern for coronary vasospasm)   Depression    Focal nodular hyperplasia of liver    GERD (gastroesophageal reflux disease)    Heart murmur    AS A CHILD   History of adenomatous polyp of colon 02/09/2014   HLD (hyperlipidemia)    HTN (hypertension)    Liver hemangioma    Migraine headache    Myocardial infarction (HCC)    NASH (nonalcoholic steatohepatitis)    nash   OSA (obstructive sleep apnea)    ahi 21.7   Palpitations    Sinus  complaint    Stroke [facility])    Unstable angina ([place]) 05/15/2017    Past Surgical History:  Procedure Laterality Date   ABDOMINOPLASTY     BLADDER SUSPENSION     CERVICAL FUSION     C4, 5, 6   COLONOSCOPY W/ BIOPSIES     ESOPHAGOGASTRODUODENOSCOPY     LEFT HEART CATH AND CORONARY ANGIOGRAPHY N/A 05/16/2017   Procedure: LEFT HEART CATH AND CORONARY ANGIOGRAPHY;  Surgeon: Troy Sine, MD;  Location: [facility];  Service: Cardiovascular;  Laterality: N/A;   TONSILLECTOMY     TUBAL LIGATION      FAMHx:  Family History  Problem Relation Age of Onset   Coronary artery disease Father    Aneurysm Father        AAA   Melanoma Father    Transient ischemic attack Father    Heart disease Father    AAA (abdominal aortic aneurysm) Father    Aneurysm Mother        AAA   Colon polyps Mother    Heart disease Mother    Irritable bowel syndrome Mother    Diverticulitis Mother    Other Mother        Evlyn Clines   AAA (abdominal aortic aneurysm) Mother    Brain cancer Mother    Breast cancer Paternal Aunt    Liver disease Maternal Uncle    Heart disease Brother    Prostate cancer Paternal Uncle    Mesothelioma Paternal Uncle    Colon cancer Neg Hx     SOCHx:   reports that she has been smoking cigarettes. She has a 7.50 pack-year smoking history. She has never used smokeless tobacco. She reports current alcohol use. She reports that she does not use drugs.  ALLERGIES:  Allergies  Allergen Reactions   Aggrenox [Aspirin-Dipyridamole Er] Tinitus   Atorvastatin Other (See Comments)    Myalgias    Codeine Itching and Other (See Comments)    Nose itches   Imitrex [Sumatriptan Base] Swelling and Other (See Comments)    Throat swelling   Isosorbide Nitrate Other (See Comments)    headaches   Other Other (See Comments)    Artificial sweeteners cause heart palpitations (stevia is ok)   Quinolones     Patient was  warned about not using  Cipro and similar antibiotics. Recent studies have raised concern that fluoroquinolone antibiotics could be associated with an increased risk of aortic aneurysm Fluoroquinolones have non-antimicrobial properties that might jeopardise the integrity of the extracellular matrix of the vascular wall In a  propensity score matched cohort study in [place], there was a 66% increased rate of aortic aneurysm or dissection associated with oral fluoroquinolone use, compared wit   Rosuvastatin Other (See Comments)    Restless legs   Simvastatin Other (See Comments)    Myalgias    ROS: Pertinent items noted in HPI and remainder of comprehensive ROS otherwise negative.  HOME MEDS: Current Outpatient Medications on File Prior to Visit  Medication Sig Dispense Refill   ALPRAZolam (XANAX) 0.25 MG tablet TAKE 2 TABLETS (0.5MG) BY MOUTH AT BEDTIME AS NEEDED FOR ANXIETY. 60 tablet 0   aspirin EC 81 MG tablet Take 81 mg by mouth daily.     benazepril (LOTENSIN) 20 MG tablet TAKE 1 TABLET BY MOUTH EVERY DAY (Patient taking differently: 40 mg. ) 90 tablet 2   butorphanol (STADOL) 10 MG/ML nasal spray INSTILL 1 SPRAY INTO ONE NOSTRIL ONCE DAILY IF NEEDED 2.5 mL 0   Cetirizine HCl (ZYRTEC PO) Take 1 tablet by mouth daily.     clopidogrel (PLAVIX) 75 MG tablet Take 1 tablet (75 mg total) by mouth daily. 90 tablet 2   doxazosin (CARDURA) 4 MG tablet Take 1 tablet (4 mg total) by mouth daily. 90 tablet 3   escitalopram (LEXAPRO) 10 MG tablet TAKE 1 TABLET BY MOUTH EVERY DAY 90 tablet 2   Hawthorn Berry 565 MG CAPS Take 1 capsule by mouth daily.     hydrochlorothiazide (HYDRODIURIL) 25 MG tablet Take 1 tablet (25 mg total) by mouth daily. Stop amlodipine 90 tablet 3   HYDROcodone-acetaminophen (NORCO/VICODIN) 5-325 MG tablet Take 1 tablet by mouth every 6 (six) hours as needed for moderate pain. 30 tablet 0   ibuprofen (ADVIL,MOTRIN) 200 MG tablet Take 400 mg by mouth 2 (two) times daily as needed for  headache or moderate pain.      metoprolol succinate (TOPROL XL) 25 MG 24 hr tablet Take 0.5 tablets (12.5 mg total) by mouth 2 (two) times daily. 30 tablet 6   nitroGLYCERIN (NITROSTAT) 0.4 MG SL tablet Place 1 tablet (0.4 mg total) under the tongue every 5 (five) minutes as needed for chest pain. 25 tablet 3   pantoprazole (PROTONIX) 40 MG tablet TAKE 1 TABLET BY MOUTH EVERY DAY BEFORE BREAKFAST 90 tablet 0   POTASSIUM PO Take 1 tablet by mouth daily.     rosuvastatin (CRESTOR) 5 MG tablet Take 1 tablet (5 mg total) by mouth daily. (Patient taking differently: Take 5 mg by mouth daily. 1x weekly) 90 tablet 3   No current facility-administered medications on file prior to visit.     LABS/IMAGING: No results found for this or any previous visit (from the past 48 hour(s)). No results found.  WEIGHTS: Wt Readings from Last 3 Encounters:  02/12/19 178 lb (80.7 kg)  01/08/19 178 lb (80.7 kg)  10/21/18 173 lb (78.5 kg)    VITALS: BP (!) 161/106    Pulse 67    Temp (!) 97.3 F (36.3 C)    Ht 5' 8"  (1.727 m)    Wt 178 lb (80.7 kg)    SpO2 96%    BMI 27.06 kg/m   EXAM: General appearance: alert and no distress Neck: no carotid bruit, no JVD  and thyroid not enlarged, symmetric, no tenderness/mass/nodules Lungs: clear to auscultation bilaterally Heart: regular rate and rhythm, S1, S2 normal, no murmur, click, rub or gallop Abdomen: soft, non-tender; bowel sounds normal; no masses,  no organomegaly Extremities: extremities normal, atraumatic, no cyanosis or edema Pulses: 2+ and symmetric Skin: Skin color, texture, turgor normal. No rashes or lesions Neurologic: Grossly normal Psych: Pleasant  EKG: Normal sinus rhythm at 63, left axis deviation, LVH, inferior and lateral T wave changes suggesting possible ischemia-personally reviewed  ASSESSMENT: 1. Drop syncope 2. PAT 3. Moderate nonobstructive CAD with history of MI in 2005 4. Recurrent TIAs without clear etiology 5. 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 Multiple bilateral pulmonary nodules 7. 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 Tobacco abuse - quit x 1 month ago 11. Statin intolerance  PLAN: 1.   Mrs. Burchill had a concerning episode of drop syncope with no awareness prior to the event.  This is highly concerning for possible cardiac etiology.  She has a history of atrial tachycardia and coronary disease with MI in 2005 however she is had no recent reevaluation of her coronaries.  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  I am recommending a 30-day monitor to see if we can pick up on any possible causes of her syncopal event.  Also her blood pressures poorly controlled.  We will stop her metoprolol and switch her to carvedilol 6.25 mg twice daily.  Will also obtain a Lexiscan Myoview stress test to evaluate for any ischemia.  Plan follow-up with me afterwards.  Pixie Casino, MD, [facility], [place] Director of the Advanced Lipid Disorders &  Cardiovascular Risk Reduction Clinic Attending Cardiologist  Direct Dial: [phone]   Fax: [phone]  Website:  [email or web address] Daymeon Fischman 02/12/2019, 3:10 PM

## 2019-02-12 NOTE — Patient Instructions (Signed)
Medication Instructions:  STOP metoprolol succiante START carvedilol 6.81m twice daily *If you need a refill on your cardiac medications before your next appointment, please call your pharmacy*  Lab Work: NONE   Testing/Procedures: Dr. HDebara Picketthas ordered a 30 day event monitor.   Dr. HDebara Picketthas ordered a Lexiscan Myocardial Perfusion Imaging Study. This is to be completed @ [address]  Please arrive 15 minutes prior to your appointment time for registration and insurance purposes.   The test will take approximately 3 to 4 hours to complete; you may bring reading material.  If someone comes with you to your appointment, they will need to remain in the main lobby due to limited space in the testing area. **If you are pregnant or breastfeeding, please notify the nuclear lab prior to your appointment**   How to prepare for your Myocardial Perfusion Test:  Do not eat or drink 3 hours prior to your test, except you may have water.  Do not consume products containing caffeine (regular or decaffeinated) 12 hours prior to your test. (ex: coffee, chocolate, sodas, tea).  Do wear comfortable clothes (no dresses or overalls) and walking shoes, tennis shoes preferred (No heels or open toe shoes are allowed).  Do NOT wear cologne, perfume, aftershave, or lotions (deodorant is allowed).  If you use an inhaler, use it the AM of your test and bring it with you.   If you use a nebulizer, use it the AM of your test.   If these instructions are not followed, your test will have to be rescheduled.   Follow-Up: At [facility] you and your health needs are our priority.  As part of our continuing mission to provide you with exceptional heart care, we have created designated Provider Care Teams.  These Care Teams include your primary Cardiologist (physician) and Advanced Practice Providers (APPs -  Physician Assistants and Nurse Practitioners) who all work together to provide you  with the care you need, when you need it.  Your next appointment:   6 weeks  The format for your next appointment:   In Person  Provider:   You may see KPixie Casino MD or one of the following Advanced Practice Providers on your designated Care Team:    HAlmyra Deforest PA-C  AFabian Sharp PA-C or   KRoby Lofts [place]  Other Instructions

## 2019-02-16 ENCOUNTER — Telehealth (HOSPITAL_COMMUNITY): Payer: Self-pay

## 2019-02-16 NOTE — Telephone Encounter (Signed)
Spoke with the patient, instructions given. She stated that she would be here for her test. Asked to call back with any questions. S.Kenndra Morris EMTP

## 2019-02-17 ENCOUNTER — Other Ambulatory Visit: Payer: Self-pay

## 2019-02-17 ENCOUNTER — Ambulatory Visit (HOSPITAL_COMMUNITY): Payer: BC Managed Care – PPO | Attending: Cardiovascular Disease

## 2019-02-17 DIAGNOSIS — R55 Syncope and collapse: Secondary | ICD-10-CM | POA: Diagnosis present

## 2019-02-17 DIAGNOSIS — R079 Chest pain, unspecified: Secondary | ICD-10-CM | POA: Diagnosis present

## 2019-02-17 LAB — MYOCARDIAL PERFUSION IMAGING
LV dias vol: 79 mL (ref 46–106)
LV sys vol: 25 mL
Peak HR: 81 {beats}/min
Rest HR: 60 {beats}/min
SDS: 3
SRS: 0
SSS: 3
TID: 1.06

## 2019-02-17 MED ORDER — TECHNETIUM TC 99M TETROFOSMIN IV KIT
31.2000 | PACK | Freq: Once | INTRAVENOUS | Status: AC | PRN
  Administered 2019-02-17: 31.2 via INTRAVENOUS
  Filled 2019-02-17: qty 32

## 2019-02-17 MED ORDER — REGADENOSON 0.4 MG/5ML IV SOLN
0.4000 mg | Freq: Once | INTRAVENOUS | Status: AC
  Administered 2019-02-17: 0.4 mg via INTRAVENOUS

## 2019-02-17 MED ORDER — TECHNETIUM TC 99M TETROFOSMIN IV KIT
10.6000 | PACK | Freq: Once | INTRAVENOUS | Status: AC | PRN
  Administered 2019-02-17: 10.6 via INTRAVENOUS
  Filled 2019-02-17: qty 11

## 2019-02-18 ENCOUNTER — Other Ambulatory Visit: Payer: Self-pay

## 2019-02-18 ENCOUNTER — Other Ambulatory Visit: Payer: Self-pay | Admitting: Family Medicine

## 2019-02-18 MED ORDER — PANTOPRAZOLE SODIUM 40 MG PO TBEC: DELAYED_RELEASE_TABLET | ORAL | 0 refills | Status: DC

## 2019-02-18 NOTE — Telephone Encounter (Signed)
Ok to refill??  Last office visit 09/09/2018.  Last refill 01/22/2019.

## 2019-02-18 NOTE — Progress Notes (Deleted)
NEUROLOGY FOLLOW UP OFFICE NOTE  Janet [name] [id number]  HISTORY OF PRESENT ILLNESS: Janet [name] is a 53 year old right-handed woman with CAD, HTN, hyperlipidiemia, OSA, asthma, arthritis, depression, NASH and liver hemangioma and history of migraines who follows up for history of TIA.  UPDATE: Current medications:  ***  HISTORY: She had a TIA in March 2014, presenting as left sided numbness of her head and face. Carotid doppler performed on 08/12/12 showed no hemodynamically significant ICA stenosis. She was started on ASA afterwards.   In April 2017, she was riding in the car with her husband when suddenly her left hand became numb with weakness and wrist drop. It lasted just a few seconds but she also had left perioral numbness lasting several hours. She denied neck pain, facial weakness, or slurred speech. She followed up with her PCP, who started her on Plavix. 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 2D echo from 08/26/15 showed EF 60-65% with no cardiac source of emboli. Carotid doppler showed moderate to large amount of atherosclerotic plaque in both ICAs, but without hemodynamically significant stenosis. Hgb A1c from 08/16/15 was 5.8 and fasting lipid panel showed total cholesterol was 287, TG 185, HDL 63 and LDL 187. She was started on Livalo 109m every other day. Repeat fasting lipid panel from 09/29/15 showed LDL decreased to 125.   In September 2017, she had an episode of left sided vision loss in both eyes, lasting for about 20 seconds, consistent with a TIA presenting as left homonymous hemianopia. She denies any other symptoms, such as slurred speech, focal weakness, focal numbness or headache. LDL from 02/09/16 was 147, so she was switched from Livalo to Lipitor 862mdaily. MRI of brain from 02/17/16 was  personally reviewed was negative for acute infarct. MRA of head showed no large or medium intracranial stenosis or occlusion. 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 It also revealed ectatic ascending aorta. Repeat CTA of neck from 07/19/16 was stable. She was referred to cardiothoracic surgery, where arteriogram and possible stenting was considered. However, since she was asymptomatic and the scan from March was stable compared to November, it was decided to hold off on intervention and monitor. She also had a cardiac monitor which did not reveal any obvious arrhythmias.  In the past, she was unable to tolerate Aggrenox and statins.  [facility] numbness and paresthesia of left hand and left side of her lower face.  No associated weakness.  Lasted several hours.  No associated headache.  CT of head was negative for acute stroke.  CTA of head and neck showed mild bilateral ICA atherosclerosis but no large vessel occlusion or stenosis.  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  MRI of brain showed chronic small vessel ischemic changes and old left cerebellar infarct but no acute stroke.  2D echo showed EF 60-65%  with no source of embolus.  LDL was 162.  Hgb A1c was 5.8.  Previous to hospitalization, she had been on ASA 43m and Plavix 721mdaily and was advised to remain on dual antiplatelet therapy.  She was advised to continue Repatha which she had just started 1 to 2 weeks prior to  hospitalization.  She was discharged on nicotine patch.  Complicated migraine suspected.  Since Repatha was ineffective, it was discontinued.  Her PCP has her on Crestor once a week.  She has had ocular migraines lasting seconds. One involves only the right eye, in which she sees a crescent shape with triangles in her visual field. The other involves flashing moving lights in both eyes. There is no associated headache.  In 2018, she started to have increased migraines. They start in the back of the neck and are holocephalic, throbbing. Usually moderate intensity but sometimes severe. The headache is constant but has a severe headache every 2 to 3 days. She has a severe migraine headache associated with nausea, photophobia and phonophobia every 7 to 10 days. She uses Stadol. Otherwise, she uses over the counter ibuprofen daily. She reports stress, so that is a trigger. Rest and Stadol help.  She was started on venlafaxine and subsequently discontinued it after migraines resolved.   She has prior neck surgery with C3 to C5 fusions, however she denies neck pain or radicular pain down the arms. She does report numbness in the hands when she goes to sleep, in which she needs to shake it out.  PAST MEDICAL HISTORY: Past Medical History:  Diagnosis Date  . Adenoma of right adrenal gland    Benign  . Allergy    SEASONAL  . Anemia   . Anxiety   . Arthritis   . Asthma   . Barrett's esophagus 02/09/2014  . CAD (coronary artery disease)    a. prior MI in 2005 with 60-70% dLAD stenosis and 60-70% D2 stenosis (concern for coronary vasospasm)  . Depression   . Focal nodular hyperplasia of liver   . GERD (gastroesophageal reflux disease)   . Heart murmur    AS A CHILD  . History of adenomatous polyp of colon 02/09/2014  . HLD (hyperlipidemia)   . HTN (hypertension)   . Liver hemangioma   . Migraine headache   . [facility] ([facility]  . NASH (nonalcoholic steatohepatitis)     nash  . OSA (obstructive sleep apnea)    ahi 21.7  . Palpitations   . Sinus complaint   . Stroke ([facility]  . Unstable angina ([facility]/16/2019    MEDICATIONS: Current Outpatient Medications on File Prior to Visit  Medication Sig Dispense Refill  . ALPRAZolam (XANAX) 0.25 MG tablet TAKE 2 TABLETS (0.5MG) BY MOUTH AT BEDTIME AS NEEDED FOR ANXIETY. 60 tablet 0  . aspirin EC 81 MG tablet Take 81 mg by mouth daily.    . benazepril (LOTENSIN) 20 MG tablet TAKE 1 TABLET BY MOUTH EVERY DAY (Patient taking differently: 40 mg. ) 90 tablet 2  . butorphanol (STADOL) 10 MG/ML nasal spray INSTILL 1 SPRAY INTO ONE NOSTRIL ONCE DAILY IF NEEDED 2.5 mL 0  . carvedilol (COREG) 6.25 MG tablet Take 1 tablet (6.25 mg total) by mouth 2 (two) times daily. 180 tablet 3  . Cetirizine HCl (ZYRTEC PO) Take 1 tablet by mouth daily.    . clopidogrel (PLAVIX) 75 MG tablet Take 1 tablet (75 mg total) by mouth daily. 90 tablet 2  . doxazosin (  CARDURA) 4 MG tablet Take 1 tablet (4 mg total) by mouth daily. 90 tablet 3  . escitalopram (LEXAPRO) 10 MG tablet TAKE 1 TABLET BY MOUTH EVERY DAY 90 tablet 2  . Hawthorn Berry 565 MG CAPS Take 1 capsule by mouth daily.    . hydrochlorothiazide (HYDRODIURIL) 25 MG tablet Take 1 tablet (25 mg total) by mouth daily. Stop amlodipine 90 tablet 3  . HYDROcodone-acetaminophen (NORCO/VICODIN) 5-325 MG tablet Take 1 tablet by mouth every 6 (six) hours as needed for moderate pain. 30 tablet 0  . ibuprofen (ADVIL,MOTRIN) 200 MG tablet Take 400 mg by mouth 2 (two) times daily as needed for headache or moderate pain.     . nitroGLYCERIN (NITROSTAT) 0.4 MG SL tablet Place 1 tablet (0.4 mg total) under the tongue every 5 (five) minutes as needed for chest pain. 25 tablet 3  . pantoprazole (PROTONIX) 40 MG tablet TAKE 1 TABLET BY MOUTH EVERY DAY BEFORE BREAKFAST 90 tablet 0  . POTASSIUM PO Take 1 tablet by mouth daily.    . rosuvastatin (CRESTOR) 5 MG tablet Take 1 tablet (5 mg total) by mouth daily.  (Patient taking differently: Take 5 mg by mouth daily. 1x weekly) 90 tablet 3   No current facility-administered medications on file prior to visit.     ALLERGIES: Allergies  Allergen Reactions  . Aggrenox [Aspirin-Dipyridamole Er] Tinitus  . Atorvastatin Other (See Comments)    Myalgias   . Codeine Itching and Other (See Comments)    Nose itches  . Imitrex [Sumatriptan Base] Swelling and Other (See Comments)    Throat swelling  . Isosorbide Nitrate Other (See Comments)    headaches  . Other Other (See Comments)    Artificial sweeteners cause heart palpitations (stevia is ok)  . Quinolones     Patient was warned about not using Cipro and similar antibiotics. Recent studies have raised concern that fluoroquinolone antibiotics could be associated with an increased risk of aortic aneurysm Fluoroquinolones have non-antimicrobial properties that might jeopardise the integrity of the extracellular matrix of the vascular wall In a  propensity score matched cohort study in [place], there was a 66% increased rate of aortic aneurysm or dissection associated with oral fluoroquinolone use, compared wit  . Rosuvastatin Other (See Comments)    Restless legs  . Simvastatin Other (See Comments)    Myalgias    FAMILY HISTORY: Family History  Problem Relation Age of Onset  . Coronary artery disease Father   . Aneurysm Father        AAA  . Melanoma Father   . Transient ischemic attack Father   . Heart disease Father   . AAA (abdominal aortic aneurysm) Father   . Aneurysm Mother        AAA  . Colon polyps Mother   . Heart disease Mother   . Irritable bowel syndrome Mother   . Diverticulitis Mother   . Other Mother        Evlyn [name]  . AAA (abdominal aortic aneurysm) Mother   . Brain cancer Mother   . Breast cancer Paternal Aunt   . Liver disease Maternal Uncle   . Heart disease Brother   . Prostate cancer Paternal Uncle   . Mesothelioma Paternal Uncle   . Colon cancer Neg Hx      SOCIAL HISTORY: Social History   Socioeconomic History  . Marital status: Married    Spouse name: Not on file  . Number of children: 1  . Years of education:  Not on file  . Highest education level: Not on file  Occupational History  . Occupation: MECHANICAL DESIGNER    Employer: [employer]  Social Needs  . Financial resource strain: Not on file  . Food insecurity    Worry: Not on file    Inability: Not on file  . Transportation needs    Medical: Not on file    Non-medical: Not on file  Tobacco Use  . Smoking status: Current Every Day Smoker    Packs/day: 0.25    Years: 30.00    Pack years: 7.50    Types: Cigarettes  . Smokeless tobacco: Never Used  . Tobacco comment: restarted 3 weeks ago  Substance and Sexual Activity  . Alcohol use: Yes    Comment: occasional; 1 per day  . Drug use: No  . Sexual activity: Not on file  Lifestyle  . Physical activity    Days per week: Not on file    Minutes per session: Not on file  . Stress: Not on file  Relationships  . Social Herbalist on phone: Not on file    Gets together: Not on file    Attends religious service: Not on file    Active member of club or organization: Not on file    Attends meetings of clubs or organizations: Not on file    Relationship status: Not on file  . Intimate partner violence    Fear of current or ex partner: Not on file    Emotionally abused: Not on file    Physically abused: Not on file    Forced sexual activity: Not on file  Other Topics Concern  . Not on file  Social History Narrative   She is married with 1 son   She is a Tax inspector at a factory   Former smoker   Alcohol is used at least once a week glass of wine   Denies drug use    REVIEW OF SYSTEMS: Constitutional: No fevers, chills, or sweats, no generalized fatigue, change in appetite Eyes: No visual changes, double vision, eye pain Ear, nose and throat: No hearing loss, ear pain, nasal congestion, sore  throat Cardiovascular: No chest pain, palpitations Respiratory:  No shortness of breath at rest or with exertion, wheezes GastrointestinaI: No nausea, vomiting, diarrhea, abdominal pain, fecal incontinence Genitourinary:  No dysuria, urinary retention or frequency Musculoskeletal:  No neck pain, back pain Integumentary: No rash, pruritus, skin lesions Neurological: as above Psychiatric: No depression, insomnia, anxiety Endocrine: No palpitations, fatigue, diaphoresis, mood swings, change in appetite, change in weight, increased thirst Hematologic/Lymphatic:  No purpura, petechiae. Allergic/Immunologic: no itchy/runny eyes, nasal congestion, recent allergic reactions, rashes  PHYSICAL EXAM: *** General: No acute distress.  Patient appears well-groomed.   Head:  Normocephalic/atraumatic Eyes:  Fundi examined but not visualized Neck: supple, no paraspinal tenderness, full range of motion Heart:  Regular rate and rhythm Lungs:  Clear to auscultation bilaterally Back: No paraspinal tenderness Neurological Exam: alert and oriented to person, place, and time. Attention span and concentration intact, recent and remote memory intact, fund of knowledge intact.  Speech fluent and not dysarthric, language intact.  CN II-XII intact. Bulk and tone normal, muscle strength 5/5 throughout.  Sensation to light touch, temperature and vibration intact.  Deep tendon reflexes 2+ throughout, toes downgoing.  Finger to nose and heel to shin testing intact.  Gait normal, Romberg negative.  IMPRESSION: 1.  Cheiro-oral Syndrome.  Migraine possible but I feel TIA  is a significant consideration. 2.  Migraine without aura, without status migrainosus, not intractable 3.  Ocular migraines 4.  Cerebrovascular disease 5.  Hypertension 6.  Hyperlipidemia 7.  Tobacco use disorder 8.  Coronary artery disease 9.  Obstructive sleep apnea  PLAN: 1.  Continue dual antiplatelet therapy 2.  Crestor (LDL goal less than 70)  3.  Blood pressure control 4.  Smoking cessation 5.  Mediterranean diet and exercise 6.  Follow up in 4 months.  Janet Clines, DO  CC: Jenna Luo, MD

## 2019-02-19 MED ORDER — BUTORPHANOL TARTRATE 10 MG/ML NA SOLN: 1.0000 | Freq: Every day | NASAL | 0 refills | Status: DC | PRN

## 2019-02-20 ENCOUNTER — Telehealth: Payer: Self-pay | Admitting: *Deleted

## 2019-02-20 ENCOUNTER — Telehealth: Payer: Self-pay

## 2019-02-20 NOTE — Telephone Encounter (Signed)
LM with monitor instructions. 30 day event monitor ordered.

## 2019-02-20 NOTE — Telephone Encounter (Signed)
Received request from pharmacy for PA on Stadol Nasal Spray.  PA submitted.   Dx: G44.009- cluster headache syndrome

## 2019-02-20 NOTE — Telephone Encounter (Signed)
Received immediate determination.   PA approved 02/20/2019 through 03/21/2019.  Pharmacy made aware.

## 2019-02-23 ENCOUNTER — Ambulatory Visit: Payer: BC Managed Care – PPO | Admitting: Neurology

## 2019-02-27 ENCOUNTER — Ambulatory Visit (INDEPENDENT_AMBULATORY_CARE_PROVIDER_SITE_OTHER): Payer: BC Managed Care – PPO

## 2019-02-27 DIAGNOSIS — R55 Syncope and collapse: Secondary | ICD-10-CM | POA: Diagnosis not present

## 2019-03-04 ENCOUNTER — Ambulatory Visit: Payer: BC Managed Care – PPO | Admitting: Internal Medicine

## 2019-03-30 ENCOUNTER — Inpatient Hospital Stay (HOSPITAL_BASED_OUTPATIENT_CLINIC_OR_DEPARTMENT_OTHER): Payer: BC Managed Care – PPO | Admitting: Oncology

## 2019-03-30 ENCOUNTER — Other Ambulatory Visit: Payer: Self-pay

## 2019-03-30 ENCOUNTER — Inpatient Hospital Stay: Payer: BC Managed Care – PPO | Attending: Oncology

## 2019-03-30 ENCOUNTER — Inpatient Hospital Stay: Payer: BC Managed Care – PPO

## 2019-03-30 VITALS — BP 168/112 | HR 52 | Temp 96.9°F | Ht 68.0 in | Wt 180.0 lb

## 2019-03-30 DIAGNOSIS — G4733 Obstructive sleep apnea (adult) (pediatric): Secondary | ICD-10-CM | POA: Insufficient documentation

## 2019-03-30 DIAGNOSIS — I10 Essential (primary) hypertension: Secondary | ICD-10-CM | POA: Insufficient documentation

## 2019-03-30 DIAGNOSIS — Z8673 Personal history of transient ischemic attack (TIA), and cerebral infarction without residual deficits: Secondary | ICD-10-CM | POA: Diagnosis not present

## 2019-03-30 DIAGNOSIS — D751 Secondary polycythemia: Secondary | ICD-10-CM | POA: Diagnosis not present

## 2019-03-30 DIAGNOSIS — Z79899 Other long term (current) drug therapy: Secondary | ICD-10-CM | POA: Insufficient documentation

## 2019-03-30 DIAGNOSIS — F17210 Nicotine dependence, cigarettes, uncomplicated: Secondary | ICD-10-CM | POA: Insufficient documentation

## 2019-03-30 LAB — CBC
HCT: 53.5 % — ABNORMAL HIGH (ref 36.0–46.0)
Hemoglobin: 17.1 g/dL — ABNORMAL HIGH (ref 12.0–15.0)
MCH: 29.7 pg (ref 26.0–34.0)
MCHC: 32 g/dL (ref 30.0–36.0)
MCV: 93 fL (ref 80.0–100.0)
Platelets: 272 10*3/uL (ref 150–400)
RBC: 5.75 MIL/uL — ABNORMAL HIGH (ref 3.87–5.11)
RDW: 13.9 % (ref 11.5–15.5)
WBC: 8.6 10*3/uL (ref 4.0–10.5)
nRBC: 0 % (ref 0.0–0.2)

## 2019-03-31 ENCOUNTER — Encounter: Payer: Self-pay | Admitting: Oncology

## 2019-03-31 NOTE — Progress Notes (Signed)
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 She has been referred to [place] for evaluation of polycythemia.35 patient has had a chronically elevated hemoglobin and back in 2015 her H&H was 16.2/48.1. Most recently in January 2019 her H&H was 17.1/49.2 then 18.4/53.7 in April 2019. Her main complaint today is excessive fatigue. She reports a slight lacy rash in the medial aspect of her left knee. She denies any blood in her urine. Reports that her appetite is good and she denies any unintentional weight loss. She does not do any anabolic steroids. She reports getting a good night sleep and wakes up refreshed in the morning. She does not remember being told that she snores at night. She does not have any known chronic lung disease.  Results of blood work from 08/22/2017 were as follows: CBC showed white count of 11.5, H&H of 17.9/52.5 with an MCV of 89.6 and a platelet count of 293. E Po level was low at 1.5. Jak 2, CALR and MPL mutation testing was negative. Exon 12 testing was also negative. Urinalysis did not reveal any hematuria. CMP showed elevated AST and ALT of 63 and 107 respectively. carboxyhb levels were elevated at 9  When bone marrow biopsy on 12/13/2017 showed hypercellular bone marrow with trilineage hematopoiesis. There were no morphologic features diagnostic of myeloproliferative neoplasm  seen. Cytogenetic studies were normal  Ct chest/ abdomen did not reveal any malignancy  Interval history-patient has baseline fatigue which is unchanged.  She has ongoing sleep apnea  and she also continues to smoke.  ECOG PS- 1 Pain scale- 0 Opioid associated constipation- no  Review of systems- Review of Systems  Constitutional: Positive for malaise/fatigue. Negative for chills, fever and weight loss.  HENT: Negative for congestion, ear discharge and nosebleeds.   Eyes: Negative for blurred vision.  Respiratory: Negative for cough, hemoptysis, sputum production, shortness of breath and wheezing.   Cardiovascular: Negative for chest pain, palpitations, orthopnea and claudication.  Gastrointestinal: Negative for abdominal pain, blood in stool, constipation, diarrhea, heartburn, melena, nausea and vomiting.  Genitourinary: Negative for dysuria, flank pain, frequency, hematuria and urgency.  Musculoskeletal: Negative for back pain, joint pain and myalgias.  Skin: Negative for rash.  Neurological: Negative for dizziness, tingling, focal weakness, seizures, weakness and headaches.  Endo/Heme/Allergies: Does not bruise/bleed easily.  Psychiatric/Behavioral: Negative for depression and suicidal ideas. The patient does not have insomnia.       Allergies  Allergen Reactions  . Aggrenox [Aspirin-Dipyridamole Er] Tinitus  . Atorvastatin Other (See Comments)    Myalgias   . Codeine Itching and Other (See Comments)    Nose itches  . Imitrex [Sumatriptan Base] Swelling and Other (See Comments)    Throat swelling  . Isosorbide Nitrate Other (See Comments)    headaches  . Other Other (See Comments)    Artificial sweeteners cause heart palpitations (stevia is ok)  . Quinolones     Patient was warned  about not using Cipro and similar antibiotics. Recent studies have raised concern that fluoroquinolone antibiotics could be associated with an increased risk of aortic aneurysm Fluoroquinolones  have non-antimicrobial properties that might jeopardise the integrity of the extracellular matrix of the vascular wall In a  propensity score matched cohort study in [place], there was a 66% increased rate of aortic aneurysm or dissection associated with oral fluoroquinolone use, compared wit  . Rosuvastatin Other (See Comments)    Restless legs  . Simvastatin Other (See Comments)    Myalgias     Past Medical History:  Diagnosis Date  . Adenoma of right adrenal gland    Benign  . Allergy    SEASONAL  . Anemia   . Anxiety   . Arthritis   . Asthma   . Barrett's esophagus 02/09/2014  . CAD (coronary artery disease)    a. prior MI in 2005 with 60-70% dLAD stenosis and 60-70% D2 stenosis (concern for coronary vasospasm)  . Depression   . Focal nodular hyperplasia of liver   . GERD (gastroesophageal reflux disease)   . Heart murmur    AS A CHILD  . History of adenomatous polyp of colon 02/09/2014  . HLD (hyperlipidemia)   . HTN (hypertension)   . Liver hemangioma   . Migraine headache   . Myocardial infarction ([place])   . NASH (nonalcoholic steatohepatitis)    nash  . OSA (obstructive sleep apnea)    ahi 21.7  . Palpitations   . Sinus complaint   . Stroke ([place])   . Unstable angina ([place]) 05/15/2017     Past Surgical History:  Procedure Laterality Date  . ABDOMINOPLASTY    . BLADDER SUSPENSION    . CERVICAL FUSION     C4, 5, 6  . COLONOSCOPY W/ BIOPSIES    . ESOPHAGOGASTRODUODENOSCOPY    . LEFT HEART CATH AND CORONARY ANGIOGRAPHY N/A 05/16/2017   Procedure: LEFT HEART CATH AND CORONARY ANGIOGRAPHY;  Surgeon: Troy Sine, MD;  Location: [facility];  Service: Cardiovascular;  Laterality: N/A;  . TONSILLECTOMY    . TUBAL LIGATION      Social History   Socioeconomic History  . Marital status: Married    Spouse name: Not on file  . Number of children: 1  . Years of education: Not on file  . Highest education level: Not on file  Occupational History  .  Occupation: MECHANICAL DESIGNER    Employer: [employer]  Social Needs  . Financial resource strain: Not on file  . Food insecurity    Worry: Not on file    Inability: Not on file  . Transportation needs    Medical: Not on file    Non-medical: Not on file  Tobacco Use  . Smoking status: Current Every Day Smoker    Packs/day: 0.25    Years: 30.00    Pack years: 7.50    Types: Cigarettes  . Smokeless tobacco: Never Used  . Tobacco comment: restarted 3 weeks ago  Substance and Sexual Activity  . Alcohol use: Yes    Comment: occasional; 1 per day  . Drug use: No  . Sexual activity: Not on file  Lifestyle  . Physical activity    Days per week: Not on file    Minutes per session: Not on file  . Stress: Not on file  Relationships  . Social Herbalist on phone: Not on file    Gets together: Not on file  Attends religious service: Not on file    Active member of club or organization: Not on file    Attends meetings of clubs or organizations: Not on file    Relationship status: Not on file  . Intimate partner violence    Fear of current or ex partner: Not on file    Emotionally abused: Not on file    Physically abused: Not on file    Forced sexual activity: Not on file  Other Topics Concern  . Not on file  Social History Narrative   She is married with 1 son   She is a Tax inspector at a factory   Former smoker   Alcohol is used at least once a week glass of wine   Denies drug use    Family History  Problem Relation Age of Onset  . Coronary artery disease Father   . Aneurysm Father        AAA  . Melanoma Father   . Transient ischemic attack Father   . Heart disease Father   . AAA (abdominal aortic aneurysm) Father   . Aneurysm Mother        AAA  . Colon polyps Mother   . Heart disease Mother   . Irritable bowel syndrome Mother   . Diverticulitis Mother   . Other Mother        Evlyn Clines  . AAA (abdominal aortic aneurysm) Mother   .  Brain cancer Mother   . Breast cancer Paternal Aunt   . Liver disease Maternal Uncle   . Heart disease Brother   . Prostate cancer Paternal Uncle   . Mesothelioma Paternal Uncle   . Colon cancer Neg Hx      Current Outpatient Medications:  .  aspirin EC 81 MG tablet, Take 81 mg by mouth daily., Disp: , Rfl:  .  benazepril (LOTENSIN) 20 MG tablet, TAKE 1 TABLET BY MOUTH EVERY DAY (Patient taking differently: 40 mg. ), Disp: 90 tablet, Rfl: 2 .  butorphanol (STADOL) 10 MG/ML nasal spray, Place 1 spray into the nose daily as needed for headache., Disp: 2.5 mL, Rfl: 0 .  carvedilol (COREG) 6.25 MG tablet, Take 1 tablet (6.25 mg total) by mouth 2 (two) times daily., Disp: 180 tablet, Rfl: 3 .  Cetirizine HCl (ZYRTEC PO), Take 1 tablet by mouth daily., Disp: , Rfl:  .  clopidogrel (PLAVIX) 75 MG tablet, Take 1 tablet (75 mg total) by mouth daily., Disp: 90 tablet, Rfl: 2 .  doxazosin (CARDURA) 4 MG tablet, Take 1 tablet (4 mg total) by mouth daily., Disp: 90 tablet, Rfl: 3 .  escitalopram (LEXAPRO) 10 MG tablet, TAKE 1 TABLET BY MOUTH EVERY DAY, Disp: 90 tablet, Rfl: 2 .  hydrochlorothiazide (HYDRODIURIL) 25 MG tablet, Take 1 tablet (25 mg total) by mouth daily. Stop amlodipine, Disp: 90 tablet, Rfl: 3 .  HYDROcodone-acetaminophen (NORCO/VICODIN) 5-325 MG tablet, Take 1 tablet by mouth every 6 (six) hours as needed for moderate pain., Disp: 30 tablet, Rfl: 0 .  ibuprofen (ADVIL,MOTRIN) 200 MG tablet, Take 400 mg by mouth 2 (two) times daily as needed for headache or moderate pain. , Disp: , Rfl:  .  nitroGLYCERIN (NITROSTAT) 0.4 MG SL tablet, Place 1 tablet (0.4 mg total) under the tongue every 5 (five) minutes as needed for chest pain., Disp: 25 tablet, Rfl: 3 .  pantoprazole (PROTONIX) 40 MG tablet, TAKE 1 TABLET BY MOUTH EVERY DAY BEFORE BREAKFAST, Disp: 90 tablet, Rfl: 0 .  POTASSIUM PO,  Take 1 tablet by mouth daily., Disp: , Rfl:  .  rosuvastatin (CRESTOR) 5 MG tablet, Take 1 tablet (5 mg  total) by mouth daily. (Patient taking differently: Take 5 mg by mouth daily. 1x weekly), Disp: 90 tablet, Rfl: 3 .  ALPRAZolam (XANAX) 0.25 MG tablet, TAKE 2 TABLETS (0.5MG) BY MOUTH AT BEDTIME AS NEEDED FOR ANXIETY. (Patient not taking: Reported on 03/30/2019), Disp: 60 tablet, Rfl: 0  Physical exam:  Vitals:   03/30/19 1318  BP: (!) 168/112  Pulse: (!) 52  Temp: (!) 96.9 F (36.1 C)  TempSrc: Tympanic  Weight: 180 lb (81.6 kg)  Height: 5' 8"  (1.727 m)   Physical Exam Constitutional:      General: She is not in acute distress. HENT:     Head: Normocephalic and atraumatic.  Eyes:     Pupils: Pupils are equal, round, and reactive to light.  Neck:     Musculoskeletal: Normal range of motion.  Cardiovascular:     Rate and Rhythm: Normal rate and regular rhythm.     Heart sounds: Normal heart sounds.  Pulmonary:     Effort: Pulmonary effort is normal.     Breath sounds: Normal breath sounds.  Abdominal:     General: Bowel sounds are normal.     Palpations: Abdomen is soft.  Skin:    General: Skin is warm and dry.  Neurological:     Mental Status: She is alert and oriented to person, place, and time.      CMP Latest Ref Rng & Units 10/13/2018  Glucose 70 - 99 mg/dL 110(H)  BUN 6 - 20 mg/dL 7  Creatinine 0.44 - 1.00 mg/dL 0.70  Sodium 135 - 145 mmol/L 139  Potassium 3.5 - 5.1 mmol/L 4.0  Chloride 98 - 111 mmol/L 101  CO2 22 - 32 mmol/L 25  Calcium 8.9 - 10.3 mg/dL 10.1  Total Protein 6.5 - 8.1 g/dL 7.2  Total Bilirubin 0.3 - 1.2 mg/dL 0.9  Alkaline Phos 38 - 126 U/L 74  AST 15 - 41 U/L 66(H)  ALT 0 - 44 U/L 92(H)   CBC Latest Ref Rng & Units 03/30/2019  WBC 4.0 - 10.5 K/uL 8.6  Hemoglobin 12.0 - 15.0 g/dL 17.1(H)  Hematocrit 36.0 - 46.0 % 53.5(H)  Platelets 150 - 400 K/uL 272   Assessment and plan- Patient is a 53 y.o. female with polycythemia likely secondary due to sleep apnea and smoking here for routine follow-up  Patient did not have any significant  improvement in her symptoms including fatigue with or without phlebotomy. Given ongoing covid pandemic, we are holding off on nonemergent infusion center phlebotomies at this time.  I will plan to keep a higher threshold for phlebotomy and initiate phlebotomy if her hematocrit is greater than 55.  No phlebotomy today.  Repeat CBC in 3 months in 6 months and I will see her back in 6 months for possible phlebotomy   Visit Diagnosis 1. Polycythemia, secondary      Dr. Randa Evens, MD, MPH [facility] at [facility] [phone] 03/31/2019 12:43 PM

## 2019-04-14 ENCOUNTER — Ambulatory Visit: Payer: BC Managed Care – PPO | Admitting: Internal Medicine

## 2019-04-14 ENCOUNTER — Other Ambulatory Visit: Payer: Self-pay

## 2019-04-14 ENCOUNTER — Encounter: Payer: Self-pay | Admitting: Internal Medicine

## 2019-04-14 VITALS — BP 164/99 | HR 53 | Temp 97.3°F | Ht 68.0 in | Wt 179.4 lb

## 2019-04-14 DIAGNOSIS — I10 Essential (primary) hypertension: Secondary | ICD-10-CM

## 2019-04-14 DIAGNOSIS — E7849 Other hyperlipidemia: Secondary | ICD-10-CM | POA: Diagnosis not present

## 2019-04-14 DIAGNOSIS — R55 Syncope and collapse: Secondary | ICD-10-CM

## 2019-04-14 DIAGNOSIS — R079 Chest pain, unspecified: Secondary | ICD-10-CM

## 2019-04-14 MED ORDER — HYDRALAZINE HCL 25 MG PO TABS: 25.0000 mg | ORAL_TABLET | Freq: Two times a day (BID) | ORAL | 3 refills | Status: DC

## 2019-04-14 NOTE — Patient Instructions (Signed)
Medication Instructions:  START hydralazine 5m twice daily CONTINUE other current medications  *If you need a refill on your cardiac medications before your next appointment, please call your pharmacy*   Follow-Up: as planned on May 06, 2019

## 2019-04-14 NOTE — Progress Notes (Signed)
OFFICE NOTE  Chief Complaint:  Follow-up monitor  Primary Care Physician: Susy Frizzle, MD  HPI:  Janet [name] is a 53 y.o. female with a history of moderate coronary artery disease and MI in 2005, at which time she was noted to have a 60-70% distal LAD stenosis and a 60-70% ostial D1 stenosis on cath. There is no clear culprit lesion and she did not receive a stent. She was thought to have vasospasm. Since then she's had numerous stress tests without any recurrent ischemia and she denies any recurrent chest pain. She has had ongoing problems with TIA and has been on and off of Plavix. Mostly she notes visual field cuts as well as arm and facial numbness. She has been seen by Dr. Tomi Likens with neurology and had an echocardiogram this past year which showed an EF of 60-65%, mild LVH, mild AI and mild TR. A bubble study was not performed. She's also had bilateral carotid artery ultrasounds which showed moderate to large amount of atherosclerotic plaque on the right greater than left without hemodynamically significant stenosis. 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 She was then referred to Dr. Servando Snare with thoracic surgery. It was also noted that she had multiple subcentimeter pulmonary nodules which require follow-up. There are no plans for surgery at this time. She was placed on a monitor which demonstrated no obvious arrhythmias or atrial fibrillation. She has a history of statin intolerance in the past. She is previously been on Lipitor, Crestor, Zocor, Pravachol and is currently tolerating Livalo, however, LDL-C on 02/09/2016 was 147, which represents poor control given her high carotid artery plaque burden and history of coronary artery disease (ASCVD).   04/18/2017  Janet [name] returns today for follow-up of her dyslipidemia and coronary disease.  Fortunately she has had no further TIA events this year.  In  January she underwent echo with bubble study which was negative for PFO.  She also had carotid Dopplers which were normal.  She does have ASCVD and is been statin intolerant in the past.  Given her prior MI and LAD and diagonal stenosis on catheterization, we recommended aggressive medical therapy with a goal LDL less than 70.  She was recently started on Praluent, however her LDL was not improved.  Subsequently it seems that she was taken off the medication due to elevated liver enzymes.  She does have a history of nonalcoholic fatty liver disease.  Her AST and ALT at baseline were 39 and 60, but increased to 125 and 237 on medication.  Praluent was discontinued and her AST return to 61 with ALT 116.  She was asymptomatic with this.  05/15/2017  Janet [name] was seen today as an add-on for acute chest pain.  This was not much of an issue in December when I saw her however over the past 2 weeks she has had some progressive chest discomfort.  She is required nitroglycerin.  She feels as a squeezing pain in the center of her chest which can occur with exertion or at rest.  She said the pain radiates up to her jaw and into her teeth.  She takes nitroglycerin and does have some relief.  This past week she has had to take for 5 nitroglycerin and is concerned about coronary disease.  We repeated her EKG today which demonstrates normal sinus rhythm and inferior and lateral ischemic changes.  This was present in  her EKG in December however may be somewhat worse today.  These changes have been somewhat stable and were considered repolarization abnormalities.  07/25/2017  Janet [name] was recently seen by our advanced practice providers for symptoms of chest pain, but her symptoms so included palpitations.  She wore a monitor which showed some paroxysmal atrial tachycardia.  Short runs less than 10 beats.  It is notable that her heart rate varies.  In February heart rate was in the 80s and in January in the 70s.  Today  her heart rates in the low 50s, but I still think there is room for her to potentially be on a beta-blocker.  In addition we discussed her dyslipidemia.  Unfortunately she has been intolerant to statins and had had no response to both PCSK9 inhibitors.  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  I offered genetic testing however due to cost issues he declined.  She tells me that she has a significant family history of dyslipidemia and her brother recently started on Repatha.  I suspect he may not have improvement either.  We discussed another alternative, namely Lomitapide.  This would have to be used very cautiously given her history of abnormal liver enzymes in the past, but could significantly decrease her cholesterol by an alternative mechanism.  She wishes to discuss this further.  In the meantime I would recommend we try her on ezetimibe.  She does not have a history of trying that before.  It was ordered but apparently was too expensive when it was previously non-generic.  11/19/2017  Janet [name] returns today for follow-up.  Overall she is doing well denies any recurrent palpitations or chest pain.  Heart rates in the 80s today with normal blood pressure.  We have also been seeing her in the lipid clinic for management of dyslipidemia.  She has been intolerant to statins and was a PCSK9 nonresponder.  It was thought that she might have LDL receptor mutation however genetic testing was not performed.  We discussed starting ezetimibe which she has been taking.  She reports daily fatigue but cannot directly attributed to this medicine.  We have not yet reassessed her lipid profile.  05/22/2018  Janet [name] is seen today in follow-up of her dyslipidemia.  Overall she feels better after recently her beta-blocker was increased by Almyra Deforest, PA-C for tachycardia and history of presyncope.  She denies chest pain or worsening shortness of breath.  There is no new edema.  Blood pressure is better controlled.   She still complains of fatigue without clear etiology of those symptoms.  She has not had a recent repeat lipid profile and cholesterol remains above goal.  She had tried ezetimibe but feels that it is not well tolerated.  02/12/2019  Janet [name] is seen today in follow-up.  Recently she had an unexplained syncopal episode.  She says she was with family around a campfire and was sitting down.  She stood up to get something and then apparently passed out.  She had no recollection of it other than waking up on the ground and finding some people around her.  She denies any preceding chest pain or palpitations.  She says however recently she has been having some chest pain symptoms and does have a remote history of coronary disease.  Obviously she has significant dyslipidemia.  She also has history of arrhythmias, all of which could have caused her syncopal event.  Blood pressure is noted to be elevated today.  Recently  she had been taken off of her amlodipine because she felt like it was causing her right shoulder pain.  To the point where she was going to get injections in her shoulder but after stopping the amlodipine her symptoms improved.  04/14/2019  Janet [name] returns today for follow-up.  She just underwent a stress Myoview which was negative for ischemia.  LVEF was normal.  I increased her beta-blocker further and her 30-day monitor only showed 2 small episodes of NSVT (4 beats).  She has had no further syncopal events.  Blood pressure remains uncontrolled.  She cannot tolerate an amlodipine due to shoulder pain.  She also had left heart catheterization last January 2019 which showed a 45% ostial diagonal lesion and a 20% mid LAD stenosis.  It is unlikely that this is significantly worsened.  PMHx:  Past Medical History:  Diagnosis Date  . Adenoma of right adrenal gland    Benign  . Allergy    SEASONAL  . Anemia   . Anxiety   . Arthritis   . Asthma   . Barrett's esophagus 02/09/2014  .  CAD (coronary artery disease)    a. prior MI in 2005 with 60-70% dLAD stenosis and 60-70% D2 stenosis (concern for coronary vasospasm)  . Depression   . Focal nodular hyperplasia of liver   . GERD (gastroesophageal reflux disease)   . Heart murmur    AS A CHILD  . History of adenomatous polyp of colon 02/09/2014  . HLD (hyperlipidemia)   . HTN (hypertension)   . Liver hemangioma   . Migraine headache   . Myocardial infarction ([place])   . NASH (nonalcoholic steatohepatitis)    nash  . OSA (obstructive sleep apnea)    ahi 21.7  . Palpitations   . Sinus complaint   . Stroke ([place])   . Unstable angina ([place]) 05/15/2017    Past Surgical History:  Procedure Laterality Date  . ABDOMINOPLASTY    . BLADDER SUSPENSION    . CERVICAL FUSION     C4, 5, 6  . COLONOSCOPY W/ BIOPSIES    . ESOPHAGOGASTRODUODENOSCOPY    . LEFT HEART CATH AND CORONARY ANGIOGRAPHY N/A 05/16/2017   Procedure: LEFT HEART CATH AND CORONARY ANGIOGRAPHY;  Surgeon: Troy Sine, MD;  Location: [facility];  Service: Cardiovascular;  Laterality: N/A;  . TONSILLECTOMY    . TUBAL LIGATION      FAMHx:  Family History  Problem Relation Age of Onset  . Coronary artery disease Father   . Aneurysm Father        AAA  . Melanoma Father   . Transient ischemic attack Father   . Heart disease Father   . AAA (abdominal aortic aneurysm) Father   . Aneurysm Mother        AAA  . Colon polyps Mother   . Heart disease Mother   . Irritable bowel syndrome Mother   . Diverticulitis Mother   . Other Mother        Evlyn Clines  . AAA (abdominal aortic aneurysm) Mother   . Brain cancer Mother   . Breast cancer Paternal Aunt   . Liver disease Maternal Uncle   . Heart disease Brother   . Prostate cancer Paternal Uncle   . Mesothelioma Paternal Uncle   . Colon cancer Neg Hx     SOCHx:   reports that she has been smoking cigarettes. She has a 7.50 pack-year smoking history. She has never used smokeless tobacco. She  reports  current alcohol use. She reports that she does not use drugs.  ALLERGIES:  Allergies  Allergen Reactions  . Aggrenox [Aspirin-Dipyridamole Er] Tinitus  . Atorvastatin Other (See Comments)    Myalgias   . Codeine Itching and Other (See Comments)    Nose itches  . Imitrex [Sumatriptan Base] Swelling and Other (See Comments)    Throat swelling  . Isosorbide Nitrate Other (See Comments)    headaches  . Other Other (See Comments)    Artificial sweeteners cause heart palpitations (stevia is ok)  . Quinolones     Patient was warned about not using Cipro and similar antibiotics. Recent studies have raised concern that fluoroquinolone antibiotics could be associated with an increased risk of aortic aneurysm Fluoroquinolones have non-antimicrobial properties that might jeopardise the integrity of the extracellular matrix of the vascular wall In a  propensity score matched cohort study in [place], there was a 66% increased rate of aortic aneurysm or dissection associated with oral fluoroquinolone use, compared wit  . Rosuvastatin Other (See Comments)    Restless legs  . Simvastatin Other (See Comments)    Myalgias    ROS: Pertinent items noted in HPI and remainder of comprehensive ROS otherwise negative.  HOME MEDS: Current Outpatient Medications on File Prior to Visit  Medication Sig Dispense Refill  . aspirin EC 81 MG tablet Take 81 mg by mouth daily.    . benazepril (LOTENSIN) 20 MG tablet TAKE 1 TABLET BY MOUTH EVERY DAY (Patient taking differently: 40 mg. ) 90 tablet 2  . butorphanol (STADOL) 10 MG/ML nasal spray Place 1 spray into the nose daily as needed for headache. 2.5 mL 0  . carvedilol (COREG) 6.25 MG tablet Take 1 tablet (6.25 mg total) by mouth 2 (two) times daily. 180 tablet 3  . Cetirizine HCl (ZYRTEC PO) Take 1 tablet by mouth daily.    . clopidogrel (PLAVIX) 75 MG tablet Take 1 tablet (75 mg total) by mouth daily. 90 tablet 2  . doxazosin (CARDURA) 4 MG tablet  Take 1 tablet (4 mg total) by mouth daily. 90 tablet 3  . escitalopram (LEXAPRO) 10 MG tablet TAKE 1 TABLET BY MOUTH EVERY DAY 90 tablet 2  . hydrochlorothiazide (HYDRODIURIL) 25 MG tablet Take 1 tablet (25 mg total) by mouth daily. Stop amlodipine 90 tablet 3  . HYDROcodone-acetaminophen (NORCO/VICODIN) 5-325 MG tablet Take 1 tablet by mouth every 6 (six) hours as needed for moderate pain. 30 tablet 0  . ibuprofen (ADVIL,MOTRIN) 200 MG tablet Take 400 mg by mouth 2 (two) times daily as needed for headache or moderate pain.     . nitroGLYCERIN (NITROSTAT) 0.4 MG SL tablet Place 1 tablet (0.4 mg total) under the tongue every 5 (five) minutes as needed for chest pain. 25 tablet 3  . pantoprazole (PROTONIX) 40 MG tablet TAKE 1 TABLET BY MOUTH EVERY DAY BEFORE BREAKFAST 90 tablet 0  . POTASSIUM PO Take 1 tablet by mouth daily.    . rosuvastatin (CRESTOR) 5 MG tablet Take 1 tablet (5 mg total) by mouth daily. (Patient taking differently: Take 5 mg by mouth daily. 1x weekly) 90 tablet 3  . ALPRAZolam (XANAX) 0.25 MG tablet TAKE 2 TABLETS (0.5MG) BY MOUTH AT BEDTIME AS NEEDED FOR ANXIETY. (Patient not taking: Reported on 04/14/2019) 60 tablet 0   No current facility-administered medications on file prior to visit.    LABS/IMAGING: No results found for this or any previous visit (from the past 48 hour(s)). No results found.  WEIGHTS: Wt  Readings from Last 3 Encounters:  04/14/19 179 lb 6.4 oz (81.4 kg)  03/30/19 180 lb (81.6 kg)  02/17/19 178 lb (80.7 kg)    VITALS: BP (!) 164/99   Pulse (!) 53   Temp (!) 97.3 F (36.3 C)   Ht 5' 8"  (1.727 m)   Wt 179 lb 6.4 oz (81.4 kg)   SpO2 96%   BMI 27.28 kg/m   EXAM: Deferred  EKG: Deferred  ASSESSMENT: 1. Drop syncope 2. PAT 3. Moderate nonobstructive CAD with history of MI in 2005 4. Recurrent TIAs without clear etiology 5. 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 Multiple bilateral pulmonary nodules 7. 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 Tobacco abuse - quit x 1 month ago 11. Statin intolerance  PLAN: 1.   Janet [name] had no significant arrhythmias found on her monitor although I had already increased her beta-blocker.  Her stress test was negative for ischemia and showed normal LV function.  There was heart catheterization as well in January 2019 which showed a 20% LAD stenosis and a 45% diagonal lesion and a smaller vessel.  Otherwise there was no significant coronary disease.  She continues to feel fatigued and has vague chest pain symptoms.  She does not believe she can continue to work and is pursuing disability.  She filed for it and was denied and now is working with a Estate manager/land agent.  I do not feel that there is any cardiac etiology that should be causing her disability at this point.  Plan to keep follow-up with me on January 6 for blood pressure recheck.  Pixie Casino, MD, [facility], [place] Director of the Advanced Lipid Disorders &  Cardiovascular Risk Reduction Clinic Attending Cardiologist  Direct Dial: [phone]  Fax: [phone]  Website:  [email or web address] 04/14/2019, 1:44 PM

## 2019-04-16 ENCOUNTER — Other Ambulatory Visit: Payer: Self-pay | Admitting: Family Medicine

## 2019-04-18 ENCOUNTER — Other Ambulatory Visit: Payer: Self-pay | Admitting: Physician Assistant

## 2019-04-30 ENCOUNTER — Other Ambulatory Visit: Payer: Self-pay | Admitting: Family Medicine

## 2019-05-06 ENCOUNTER — Other Ambulatory Visit: Payer: Self-pay

## 2019-05-06 ENCOUNTER — Encounter: Payer: Self-pay | Admitting: Internal Medicine

## 2019-05-06 ENCOUNTER — Ambulatory Visit: Payer: BC Managed Care – PPO | Admitting: Internal Medicine

## 2019-05-06 VITALS — BP 143/95 | HR 64 | Ht 68.0 in | Wt 182.6 lb

## 2019-05-06 DIAGNOSIS — I10 Essential (primary) hypertension: Secondary | ICD-10-CM | POA: Diagnosis not present

## 2019-05-06 DIAGNOSIS — E7849 Other hyperlipidemia: Secondary | ICD-10-CM | POA: Diagnosis not present

## 2019-05-06 DIAGNOSIS — I2510 Atherosclerotic heart disease of native coronary artery without angina pectoris: Secondary | ICD-10-CM | POA: Diagnosis not present

## 2019-05-06 DIAGNOSIS — I471 Supraventricular tachycardia: Secondary | ICD-10-CM

## 2019-05-06 MED ORDER — HYDRALAZINE HCL 50 MG PO TABS: 50.0000 mg | ORAL_TABLET | Freq: Two times a day (BID) | ORAL | 3 refills | Status: AC

## 2019-05-06 NOTE — Patient Instructions (Signed)
Medication Instructions:  INCREASE HYDRALAZINE TO 50 MG TWICE DAILY= 2 OF THE 25 MG TABLETS ONCE DAILY *If you need a refill on your cardiac medications before your next appointment, please call your pharmacy*  Lab Work: If you have labs (blood work) drawn today and your tests are completely normal, you will receive your results only by: Marland Kitchen MyChart Message (if you have MyChart) OR . A paper copy in the mail If you have any lab test that is abnormal or we need to change your treatment, we will call you to review the results.  Follow-Up: At [facility], you and your health needs are our priority.  As part of our continuing mission to provide you with exceptional heart care, we have created designated Provider Care Teams.  These Care Teams include your primary Cardiologist (physician) and Advanced Practice Providers (APPs -  Physician Assistants and Nurse Practitioners) who all work together to provide you with the care you need, when you need it.  Your next appointment:   6 month(s)  The format for your next appointment:   Either In Person or Virtual  Provider:   You may see Pixie Casino, MD or one of the following Advanced Practice Providers on your designated Care Team:    Almyra Deforest, PA-C  Fabian Sharp, PA-C or   Roby Lofts, [place]

## 2019-05-06 NOTE — Progress Notes (Addendum)
OFFICE NOTE  Chief Complaint:  Follow-up hypertension  Primary Care Physician: Susy Frizzle, MD  HPI:  Janet [name] is a 54 y.o. female with a history of moderate coronary artery disease and MI in 2005, at which time she was noted to have a 60-70% distal LAD stenosis and a 60-70% ostial D1 stenosis on cath. There is no clear culprit lesion and she did not receive a stent. She was thought to have vasospasm. Since then she's had numerous stress tests without any recurrent ischemia and she denies any recurrent chest pain. She has had ongoing problems with TIA and has been on and off of Plavix. Mostly she notes visual field cuts as well as arm and facial numbness. She has been seen by Dr. Tomi Likens with neurology and had an echocardiogram this past year which showed an EF of 60-65%, mild LVH, mild AI and mild TR. A bubble study was not performed. She's also had bilateral carotid artery ultrasounds which showed moderate to large amount of atherosclerotic plaque on the right greater than left without hemodynamically significant stenosis. 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 She was then referred to Dr. Servando Snare with thoracic surgery. It was also noted that she had multiple subcentimeter pulmonary nodules which require follow-up. There are no plans for surgery at this time. She was placed on a monitor which demonstrated no obvious arrhythmias or atrial fibrillation. She has a history of statin intolerance in the past. She is previously been on Lipitor, Crestor, Zocor, Pravachol and is currently tolerating Livalo, however, LDL-C on 02/09/2016 was 147, which represents poor control given her high carotid artery plaque burden and history of coronary artery disease (ASCVD).   04/18/2017  Janet [name] returns today for follow-up of her dyslipidemia and coronary disease.  Fortunately she has had no further TIA events this year.   In January she underwent echo with bubble study which was negative for PFO.  She also had carotid Dopplers which were normal.  She does have ASCVD and is been statin intolerant in the past.  Given her prior MI and LAD and diagonal stenosis on catheterization, we recommended aggressive medical therapy with a goal LDL less than 70.  She was recently started on Praluent, however her LDL was not improved.  Subsequently it seems that she was taken off the medication due to elevated liver enzymes.  She does have a history of nonalcoholic fatty liver disease.  Her AST and ALT at baseline were 39 and 60, but increased to 125 and 237 on medication.  Praluent was discontinued and her AST return to 61 with ALT 116.  She was asymptomatic with this.  05/15/2017  Janet [name] was seen today as an add-on for acute chest pain.  This was not much of an issue in December when I saw her however over the past 2 weeks she has had some progressive chest discomfort.  She is required nitroglycerin.  She feels as a squeezing pain in the center of her chest which can occur with exertion or at rest.  She said the pain radiates up to her jaw and into her teeth.  She takes nitroglycerin and does have some relief.  This past week she has had to take for 5 nitroglycerin and is concerned about coronary disease.  We repeated her EKG today which demonstrates normal sinus rhythm and inferior and lateral ischemic changes.  This was present in  her EKG in December however may be somewhat worse today.  These changes have been somewhat stable and were considered repolarization abnormalities.  07/25/2017  Mrs. Becraft was recently seen by our advanced practice providers for symptoms of chest pain, but her symptoms so included palpitations.  She wore a monitor which showed some paroxysmal atrial tachycardia.  Short runs less than 10 beats.  It is notable that her heart rate varies.  In February heart rate was in the 80s and in January in the 70s.  Today  her heart rates in the low 50s, but I still think there is room for her to potentially be on a beta-blocker.  In addition we discussed her dyslipidemia.  Unfortunately she has been intolerant to statins and had had no response to both PCSK9 inhibitors.  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  I offered genetic testing however due to cost issues he declined.  She tells me that she has a significant family history of dyslipidemia and her brother recently started on Repatha.  I suspect he may not have improvement either.  We discussed another alternative, namely Lomitapide.  This would have to be used very cautiously given her history of abnormal liver enzymes in the past, but could significantly decrease her cholesterol by an alternative mechanism.  She wishes to discuss this further.  In the meantime I would recommend we try her on ezetimibe.  She does not have a history of trying that before.  It was ordered but apparently was too expensive when it was previously non-generic.  11/19/2017  Janet [name] returns today for follow-up.  Overall she is doing well denies any recurrent palpitations or chest pain.  Heart rates in the 80s today with normal blood pressure.  We have also been seeing her in the lipid clinic for management of dyslipidemia.  She has been intolerant to statins and was a PCSK9 nonresponder.  It was thought that she might have LDL receptor mutation however genetic testing was not performed.  We discussed starting ezetimibe which she has been taking.  She reports daily fatigue but cannot directly attributed to this medicine.  We have not yet reassessed her lipid profile.  05/22/2018  Janet [name] is seen today in follow-up of her dyslipidemia.  Overall she feels better after recently her beta-blocker was increased by Almyra Deforest, PA-C for tachycardia and history of presyncope.  She denies chest pain or worsening shortness of breath.  There is no new edema.  Blood pressure is better controlled.   She still complains of fatigue without clear etiology of those symptoms.  She has not had a recent repeat lipid profile and cholesterol remains above goal.  She had tried ezetimibe but feels that it is not well tolerated.  02/12/2019  Janet [name] is seen today in follow-up.  Recently she had an unexplained syncopal episode.  She says she was with family around a campfire and was sitting down.  She stood up to get something and then apparently passed out.  She had no recollection of it other than waking up on the ground and finding some people around her.  She denies any preceding chest pain or palpitations.  She says however recently she has been having some chest pain symptoms and does have a remote history of coronary disease.  Obviously she has significant dyslipidemia.  She also has history of arrhythmias, all of which could have caused her syncopal event.  Blood pressure is noted to be elevated today.  Recently  she had been taken off of her amlodipine because she felt like it was causing her right shoulder pain.  To the point where she was going to get injections in her shoulder but after stopping the amlodipine her symptoms improved.  04/14/2019  Janet [name] returns today for follow-up.  She just underwent a stress Myoview which was negative for ischemia.  LVEF was normal.  I increased her beta-blocker further and her 30-day monitor only showed 2 small episodes of NSVT (4 beats).  She has had no further syncopal events.  Blood pressure remains uncontrolled.  She cannot tolerate an amlodipine due to shoulder pain.  She also had left heart catheterization last January 2019 which showed a 45% ostial diagonal lesion and a 20% mid LAD stenosis.  It is unlikely that this is significantly worsened.  05/06/2019  Janet [name] is seen today for 1 month follow-up of hypertension.  Blood pressure was not well controlled at her last visit.  Today is 143/95.  In general this is consistent with home readings with  systolics in the 951O and diastolics in the 84Z.  Her target is to lower her blood pressure to around 120/80 ideally.  She currently takes a number of medications including carvedilol 6.25 mg twice daily, doxazosin 2 mg daily, hydralazine 25 mg twice daily, hydrochlorothiazide 25 mg daily and has been intolerant to amlodipine due to swelling.  PMHx:  Past Medical History:  Diagnosis Date  . Adenoma of right adrenal gland    Benign  . Allergy    SEASONAL  . Anemia   . Anxiety   . Arthritis   . Asthma   . Barrett's esophagus 02/09/2014  . CAD (coronary artery disease)    a. prior MI in 2005 with 60-70% dLAD stenosis and 60-70% D2 stenosis (concern for coronary vasospasm)  . Depression   . Focal nodular hyperplasia of liver   . GERD (gastroesophageal reflux disease)   . Heart murmur    AS A CHILD  . History of adenomatous polyp of colon 02/09/2014  . HLD (hyperlipidemia)   . HTN (hypertension)   . Liver hemangioma   . Migraine headache   . Myocardial infarction ([place])   . NASH (nonalcoholic steatohepatitis)    nash  . OSA (obstructive sleep apnea)    ahi 21.7  . Palpitations   . Sinus complaint   . Stroke ([place])   . Unstable angina ([place]) 05/15/2017    Past Surgical History:  Procedure Laterality Date  . ABDOMINOPLASTY    . BLADDER SUSPENSION    . CERVICAL FUSION     C4, 5, 6  . COLONOSCOPY W/ BIOPSIES    . ESOPHAGOGASTRODUODENOSCOPY    . LEFT HEART CATH AND CORONARY ANGIOGRAPHY N/A 05/16/2017   Procedure: LEFT HEART CATH AND CORONARY ANGIOGRAPHY;  Surgeon: Troy Sine, MD;  Location: [facility];  Service: Cardiovascular;  Laterality: N/A;  . TONSILLECTOMY    . TUBAL LIGATION      FAMHx:  Family History  Problem Relation Age of Onset  . Coronary artery disease Father   . Aneurysm Father        AAA  . Melanoma Father   . Transient ischemic attack Father   . Heart disease Father   . AAA (abdominal aortic aneurysm) Father   . Aneurysm Mother        AAA    . Colon polyps Mother   . Heart disease Mother   . Irritable bowel syndrome Mother   .  Diverticulitis Mother   . Other Mother        Evlyn Clines  . AAA (abdominal aortic aneurysm) Mother   . Brain cancer Mother   . Breast cancer Paternal Aunt   . Liver disease Maternal Uncle   . Heart disease Brother   . Prostate cancer Paternal Uncle   . Mesothelioma Paternal Uncle   . Colon cancer Neg Hx     SOCHx:   reports that she has been smoking cigarettes. She has a 7.50 pack-year smoking history. She has never used smokeless tobacco. She reports current alcohol use. She reports that she does not use drugs.  ALLERGIES:  Allergies  Allergen Reactions  . Aggrenox [Aspirin-Dipyridamole Er] Tinitus  . Atorvastatin Other (See Comments)    Myalgias   . Codeine Itching and Other (See Comments)    Nose itches  . Imitrex [Sumatriptan Base] Swelling and Other (See Comments)    Throat swelling  . Isosorbide Nitrate Other (See Comments)    headaches  . Other Other (See Comments)    Artificial sweeteners cause heart palpitations (stevia is ok)  . Quinolones     Patient was warned about not using Cipro and similar antibiotics. Recent studies have raised concern that fluoroquinolone antibiotics could be associated with an increased risk of aortic aneurysm Fluoroquinolones have non-antimicrobial properties that might jeopardise the integrity of the extracellular matrix of the vascular wall In a  propensity score matched cohort study in [place], there was a 66% increased rate of aortic aneurysm or dissection associated with oral fluoroquinolone use, compared wit  . Rosuvastatin Other (See Comments)    Restless legs  . Simvastatin Other (See Comments)    Myalgias    ROS: Pertinent items noted in HPI and remainder of comprehensive ROS otherwise negative.  HOME MEDS: Current Outpatient Medications on File Prior to Visit  Medication Sig Dispense Refill  . ALPRAZolam (XANAX) 0.25 MG tablet TAKE  2 TABLETS (0.5MG) BY MOUTH AT BEDTIME AS NEEDED FOR ANXIETY. 60 tablet 0  . aspirin EC 81 MG tablet Take 81 mg by mouth daily.    . benazepril (LOTENSIN) 40 MG tablet Take 40 mg by mouth 2 (two) times daily.    . butorphanol (STADOL) 10 MG/ML nasal spray Place 1 spray into the nose daily as needed for headache. 2.5 mL 0  . carvedilol (COREG) 6.25 MG tablet Take 1 tablet (6.25 mg total) by mouth 2 (two) times daily. 180 tablet 3  . Cetirizine HCl (ZYRTEC PO) Take 1 tablet by mouth daily.    . clopidogrel (PLAVIX) 75 MG tablet Take 1 tablet (75 mg total) by mouth daily. 90 tablet 2  . doxazosin (CARDURA) 2 MG tablet TAKE 1 TABLET BY MOUTH EVERY DAY (Patient taking differently: Take 2 mg by mouth 2 (two) times daily. ) 90 tablet 1  . escitalopram (LEXAPRO) 10 MG tablet TAKE 1 TABLET BY MOUTH EVERY DAY 90 tablet 2  . hydrochlorothiazide (HYDRODIURIL) 25 MG tablet Take 1 tablet (25 mg total) by mouth daily. Stop amlodipine 90 tablet 3  . HYDROcodone-acetaminophen (NORCO/VICODIN) 5-325 MG tablet Take 1 tablet by mouth every 6 (six) hours as needed for moderate pain. 30 tablet 0  . ibuprofen (ADVIL,MOTRIN) 200 MG tablet Take 400 mg by mouth 2 (two) times daily as needed for headache or moderate pain.     . nitroGLYCERIN (NITROSTAT) 0.4 MG SL tablet Place 1 tablet (0.4 mg total) under the tongue every 5 (five) minutes as needed for chest pain. 25 tablet  3  . pantoprazole (PROTONIX) 40 MG tablet TAKE 1 TABLET BY MOUTH EVERY DAY BEFORE BREAKFAST 90 tablet 0  . POTASSIUM PO Take 1 tablet by mouth daily.    . rosuvastatin (CRESTOR) 5 MG tablet Take 1 tablet (5 mg total) by mouth daily. (Patient taking differently: Take 5 mg by mouth daily. 1x weekly) 90 tablet 3   No current facility-administered medications on file prior to visit.    LABS/IMAGING: No results found for this or any previous visit (from the past 48 hour(s)). No results found.  WEIGHTS: Wt Readings from Last 3 Encounters:  05/06/19 182 lb  9.6 oz (82.8 kg)  04/14/19 179 lb 6.4 oz (81.4 kg)  03/30/19 180 lb (81.6 kg)    VITALS: BP (!) 143/95   Pulse 64   Ht 5' 8"  (1.727 m)   Wt 182 lb 9.6 oz (82.8 kg)   SpO2 94%   BMI 27.76 kg/m   EXAM: Deferred  EKG: Deferred  ASSESSMENT: 1. Drop syncope -low risk Myoview stress test, no significant abnormalities on Holter monitoring (01/2019) 2. Uncontrolled hypertension 3. History of PAT 4. Moderate nonobstructive CAD with history of MI in 2005 5. Recurrent TIAs without clear etiology 6. 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 Multiple bilateral pulmonary nodules 8. 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 Familial hyperlipidemia (HeFH) - possible LDLr mutuation (non-responder to PCSK9) 11. Tobacco abuse - quit x 1 month ago 12. Statin intolerance  PLAN: 1.   Mrs. Mells has persistently uncontrolled hypertension.  I like to increase her hydralazine further to 50 mg twice daily.  She will continue her additional medications.  She should monitor BP at home and contact us with those readings in 1 to 2 weeks.  Further adjustments in medication may be necessary.  Follow-up in 6 months or sooner as necessary.  Pixie Casino, MD, [facility], [place] Director of the Advanced Lipid Disorders &  Cardiovascular Risk Reduction Clinic Attending Cardiologist  Direct Dial: [phone]  Fax: [phone]  Website:  [email or web address] 05/06/2019, 12:37 PM

## 2019-05-25 ENCOUNTER — Other Ambulatory Visit: Payer: Self-pay

## 2019-05-25 MED ORDER — PANTOPRAZOLE SODIUM 40 MG PO TBEC: DELAYED_RELEASE_TABLET | ORAL | 0 refills | Status: AC

## 2019-05-25 NOTE — Telephone Encounter (Signed)
Pantoprazole refilled. 

## 2019-06-05 ENCOUNTER — Other Ambulatory Visit: Payer: Self-pay | Admitting: Physician Assistant

## 2019-06-27 ENCOUNTER — Other Ambulatory Visit: Payer: Self-pay | Admitting: Family Medicine

## 2019-06-27 ENCOUNTER — Encounter: Payer: Self-pay | Admitting: Family Medicine

## 2019-06-27 DIAGNOSIS — G459 Transient cerebral ischemic attack, unspecified: Secondary | ICD-10-CM

## 2019-06-28 IMAGING — CT CT ANGIO CHEST
2 of 6 series · 13 of 36 positions shown · IV contrast (iopamidol)
Comparison: 01/17/2017

CLINICAL DATA: Thoracic aortic aneurysm. Carotid dissection.
Follow-up.

EXAM:
CT ANGIOGRAPHY CHEST WITH CONTRAST
TECHNIQUE: Multidetector CT imaging of the chest was performed using the
standard protocol during bolus administration of intravenous
contrast. Multiplanar CT image reconstructions and MIPs were
obtained to evaluate the vascular anatomy.
CONTRAST:  75mL SLS62F-YUT IOPAMIDOL (SLS62F-YUT) INJECTION 76%

[Series 9: cta thorax 2.00 bv36 s3 ax axial arterial · axial · arterial · 0.76mm/px · z∈[+1659,+1959]mm · 12 of 178 slices shown]
[im 14/178  lung]
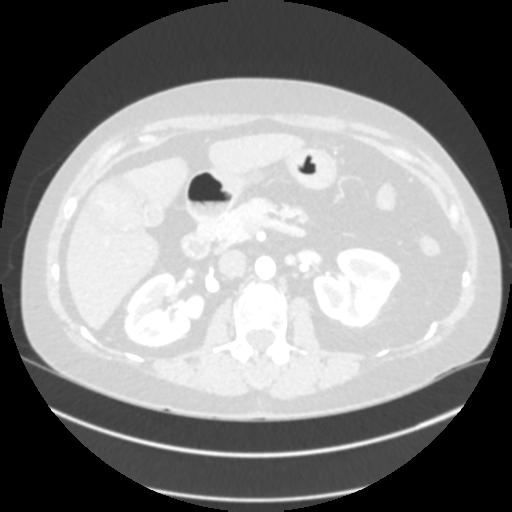
[im 28/178  mediastinal]
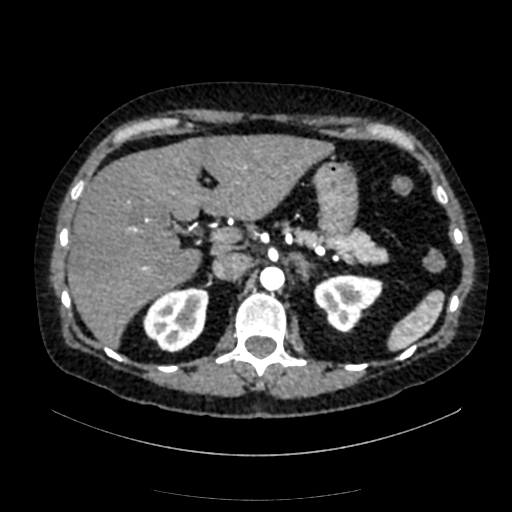
[im 41/178  lung]
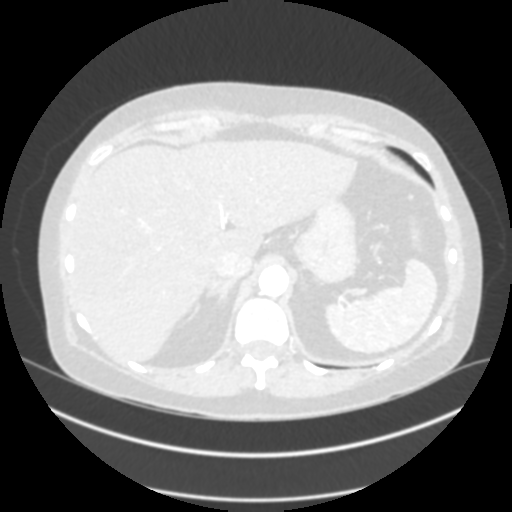
[im 55/178  mediastinal]
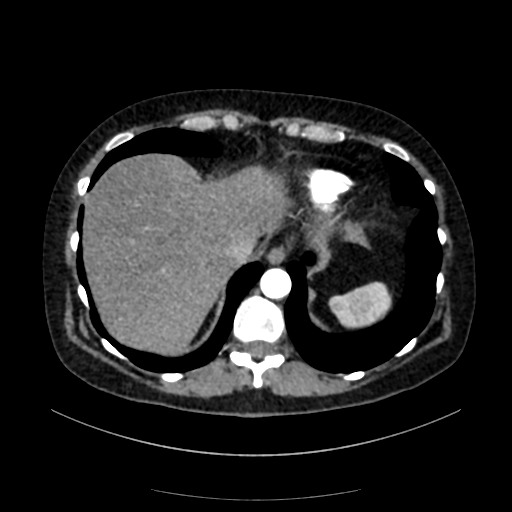
[im 69/178  lung]
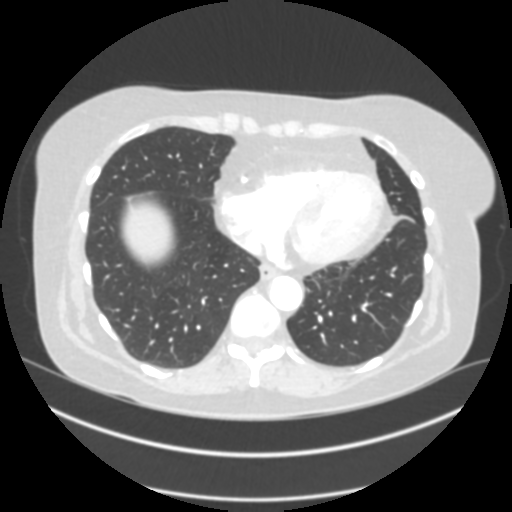
[im 82/178  mediastinal]
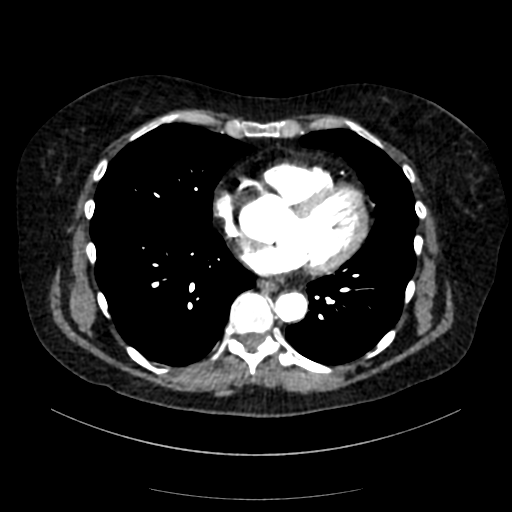
[im 96/178  lung]
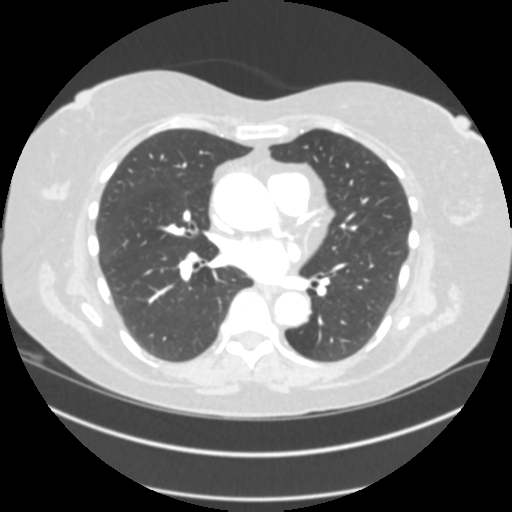
[im 109/178  mediastinal]
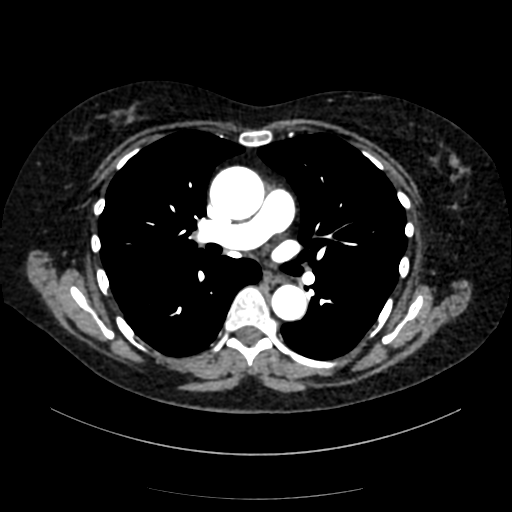
[im 123/178  lung]
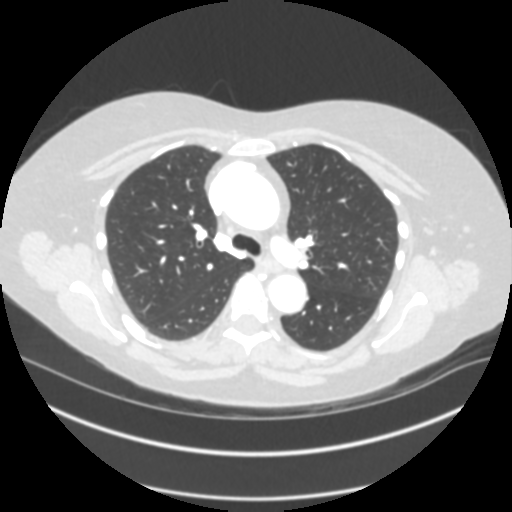
[im 137/178  mediastinal]
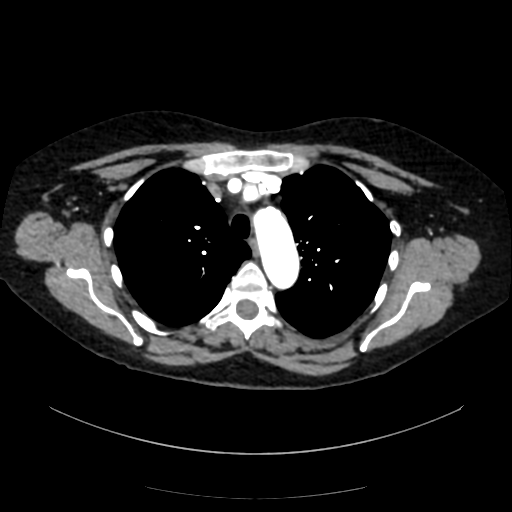
[im 150/178  lung]
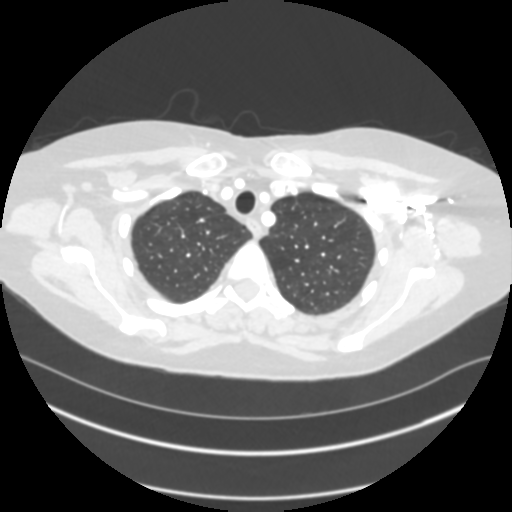
[im 164/178  mediastinal]
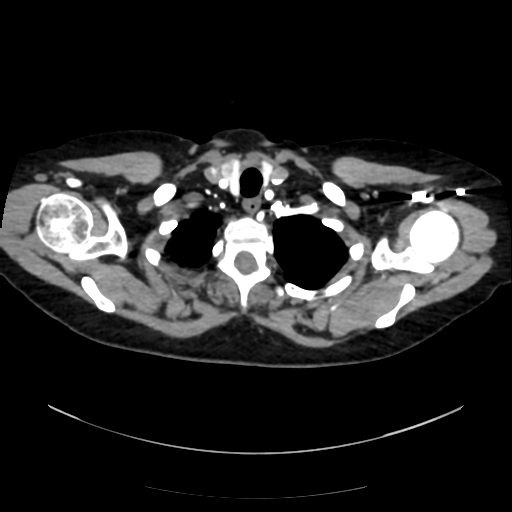

[Series 14: cta thorax 2.00 bv36 s3 cor cor st · coronal · 0.70mm/px · 1 of 142 slices shown]
[im 71/142  mediastinal]
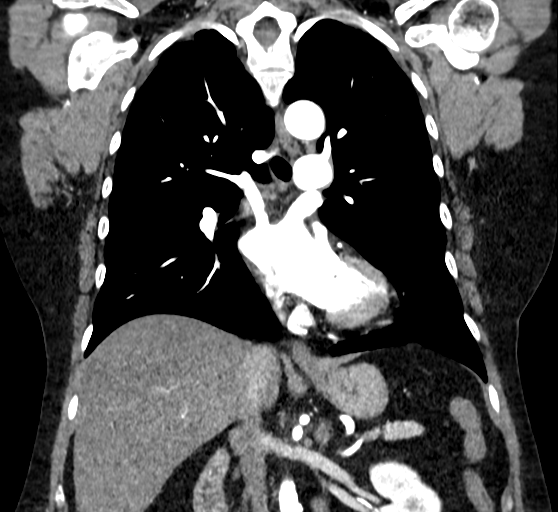

[13 of 36 positions shown; findings below may reference images not displayed]

FINDINGS: Cardiovascular: Stable mild aneurysmal dilatation of the ascending
thoracic aorta, 4 cm in the proximal ascending aorta. No change.
Focal dissection noted within the proximal left common carotid
artery. The larger lumen of the dissection is now thrombosed, new
since prior study, with mild luminal narrowing in the remaining
patent lumen. Heart is normal size. Moderate coronary artery
calcifications. Scattered aortic calcifications.

Mediastinum/Nodes: No mediastinal, hilar, or axillary adenopathy.

Lungs/Pleura: Mild biapical scarring and paraseptal emphysema. No
confluent opacities or effusions.

Upper Abdomen: Stable lesion in the right lobe of the liver
measuring up to 4.3 cm. Small right adrenal nodule is stable.

Musculoskeletal: No acute bony abnormality.

Review of the MIP images confirms the above findings.
IMPRESSION: Stable mild aneurysmal dilatation of the ascending thoracic aorta, 4
cm maximally. Recommend annual imaging followup by CTA or MRA. This
recommendation follows 5181
ACCF/AHA/AATS/ACR/ASA/SCA/TAMARE/PHURO/ROXANA PATRICIA/TIGER Guidelines for the
Diagnosis and Management of Patients with Thoracic Aortic Disease.
Circulation. 5181; 121: [id number]

Continued focal dissection in the proximal left common carotid
artery. The larger lumen is now thrombosed, presumably the false
lumen with mild-to-moderate narrowing of the true lumen.

Mild biapical scarring.

Stable lesion in the right hepatic lobe shown on prior imaging to be
most compatible with FNH.

Coronary artery disease.  Aortic atherosclerosis.

## 2019-06-29 ENCOUNTER — Inpatient Hospital Stay: Payer: BC Managed Care – PPO

## 2019-06-29 ENCOUNTER — Inpatient Hospital Stay: Payer: BC Managed Care – PPO | Attending: Oncology

## 2019-06-29 NOTE — Telephone Encounter (Signed)
Requesting refill    stadol  LOV: 09/09/18  LRF:  02/19/19

## 2019-06-30 MED ORDER — BUTORPHANOL TARTRATE 10 MG/ML NA SOLN: 1.0000 | Freq: Every day | NASAL | 0 refills | Status: AC | PRN

## 2019-07-06 NOTE — Progress Notes (Signed)
NEUROLOGY FOLLOW UP OFFICE NOTE  Janet [name] [id number]  HISTORY OF PRESENT ILLNESS: Janet [name] is a 54 year old right-handed woman with CAD, HTN, hyperlipidiemia, OSA, asthma, arthritis, depression, NASH and liver hemangioma and migraines who follows up for TIA.  UPDATE: Current medications:  ASA 19m; Plavix 723m Crestor 48m348meekly; Lotensin; Coreg; HCTZ; hydralazine  Doing well.  She had a syncopal episode back in October.  She stood up from a campfire and passed out, falling backwards on a bucket.  She followed up with cardiology.  No subsequent events.  HISTORY: She had a TIA in March 2014, presenting as left sided numbness of her head and face. Carotid doppler performed on 08/12/12 showed no hemodynamically significant ICA stenosis. She was started on ASA afterwards.   In April 2017, she was riding in the car with her husband when suddenly her left hand became numb with weakness and wrist drop. It lasted just a few seconds but she also had left perioral numbness lasting several hours. She denied neck pain, facial weakness, or slurred speech. She followed up with her PCP, who started her on Plavix. 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 2D echo from 08/26/15 showed EF 60-65% with no cardiac source of emboli. Carotid doppler showed moderate to large amount of atherosclerotic plaque in both ICAs, but without hemodynamically significant stenosis. Hgb A1c from 08/16/15 was 5.8 and fasting lipid panel showed total cholesterol was 287, TG 185, HDL 63 and LDL 187. She was started on Livalo 2mg248mery other day. Repeat fasting lipid panel from 09/29/15 showed LDL decreased to 125.   In September 2017, she had an episode of left sided vision loss in both eyes, lasting for about 20 seconds, consistent with a TIA presenting  as left homonymous hemianopia. She denies any other symptoms, such as slurred speech, focal weakness, focal numbness or headache. LDL from 02/09/16 was 147, so she was switched from Livalo to Lipitor 80mg83mly. MRI of brain from 02/17/16 was personally reviewed was negative for acute infarct. MRA of head showed no large or medium intracranial stenosis or occlusion. 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 It also revealed ectatic ascending aorta. Repeat CTA of neck from 07/19/16 was stable. She was referred to cardiothoracic surgery, where arteriogram and possible stenting was considered. However, since she was asymptomatic and the scan from March was stable compared to November, it was decided to hold off on intervention and monitor. She also had a cardiac monitor which did not reveal any obvious arrhythmias.  In the past, she was unable to tolerate Aggrenox and statins.  She has had ocular migraines lasting seconds. One involves only the right eye, in which she sees a crescent shape with triangles in her visual field. The other involves flashing moving lights in both eyes. There is no associated headache.  In 2018, she started to have increased migraines. They start in the back of the neck and are holocephalic, throbbing. Usually moderate intensity but sometimes severe.  The headache is constant but has a severe headache every 2 to 3 days. She has a severe migraine headache associated with nausea, photophobia and phonophobia every 7 to 10 days. She uses Stadol. Otherwise, she uses over the counter  ibuprofen daily. She reports stress, so that is a trigger. Rest and Stadol help.  She was started on venlafaxine and subsequently discontinued it after migraines resolved.   She was admitted to [facility] on 10/13/18 for numbness and paresthesia of left hand and left side of her lower face.  No associated weakness.  Lasted several hours.  No associated headache.  CT of head was negative for acute stroke.  CTA of head and neck showed mild bilateral ICA atherosclerosis but no large vessel occlusion or stenosis.  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  MRI of brain showed chronic small vessel ischemic changes and old left cerebellar infarct but no acute stroke.  2D echo showed EF 60-65% with no source of embolus.  LDL was 162.  Hgb A1c was 5.8.  Previous to hospitalization, she had been on ASA 82m and Plavix 726mdaily and was advised to remain on dual antiplatelet therapy.  She was advised to continue Repatha which was subsequently stopped because it was ineffective.  She was discharged on nicotine patch.  Complicated migraine suspected.  Since Repatha was ineffective, it was discontinued.  Her PCP has her on Crestor once a week.  She has prior neck surgery with C3 to C5 fusions, however she denies neck pain or radicular pain down the arms. She does report numbness in the hands when she goes to sleep, in which she needs to shake it out.  PAST MEDICAL HISTORY: Past Medical History:  Diagnosis Date  . Adenoma of right adrenal gland    Benign  . Allergy    SEASONAL  . Anemia   . Anxiety   . Arthritis   . Asthma   . Barrett's esophagus 02/09/2014  . CAD (coronary artery disease)    a. prior MI in 2005 with 60-70% dLAD stenosis and 60-70% D2 stenosis (concern for coronary vasospasm)  . Depression   . Focal nodular hyperplasia of liver   . GERD (gastroesophageal reflux disease)   . Heart murmur    AS A  CHILD  . History of adenomatous polyp of colon 02/09/2014  . HLD (hyperlipidemia)   . HTN (hypertension)   . Liver hemangioma   . Migraine headache   . Myocardial infarction (HCCastalia  . NASH (nonalcoholic steatohepatitis)    nash  . OSA (obstructive sleep apnea)    ahi 21.7  . Palpitations   . Sinus complaint   . Stroke (HCLiberty  . Unstable angina (HCMillport1/16/2019    MEDICATIONS: Current Outpatient Medications on File Prior to Visit  Medication Sig Dispense Refill  . ALPRAZolam (XANAX) 0.25 MG tablet TAKE 2 TABLETS (0.5MG) BY MOUTH AT BEDTIME AS NEEDED FOR ANXIETY. 60 tablet 0  . aspirin EC 81 MG tablet Take 81 mg by mouth daily.    . benazepril (LOTENSIN) 40 MG tablet Take 40 mg by mouth 2 (two) times daily.    . butorphanol (STADOL) 10 MG/ML nasal spray Place 1 spray into the nose daily as needed for headache. 2.5 mL 0  . carvedilol (COREG) 6.25 MG tablet Take 1 tablet (6.25 mg total) by mouth 2 (two) times daily. 180 tablet 3  .  Cetirizine HCl (ZYRTEC PO) Take 1 tablet by mouth daily.    . clopidogrel (PLAVIX) 75 MG tablet TAKE 1 TABLET BY MOUTH EVERY DAY 90 tablet 2  . doxazosin (CARDURA) 2 MG tablet TAKE 1 TABLET BY MOUTH EVERY DAY (Patient taking differently: Take 2 mg by mouth 2 (two) times daily. ) 90 tablet 1  . escitalopram (LEXAPRO) 10 MG tablet TAKE 1 TABLET BY MOUTH EVERY DAY 90 tablet 2  . hydrALAZINE (APRESOLINE) 50 MG tablet Take 1 tablet (50 mg total) by mouth 2 (two) times daily. 180 tablet 3  . hydrochlorothiazide (HYDRODIURIL) 25 MG tablet Take 1 tablet (25 mg total) by mouth daily. Stop amlodipine 90 tablet 3  . HYDROcodone-acetaminophen (NORCO/VICODIN) 5-325 MG tablet Take 1 tablet by mouth every 6 (six) hours as needed for moderate pain. 30 tablet 0  . ibuprofen (ADVIL,MOTRIN) 200 MG tablet Take 400 mg by mouth 2 (two) times daily as needed for headache or moderate pain.     . nitroGLYCERIN (NITROSTAT) 0.4 MG SL tablet Place 1 tablet (0.4 mg total) under the tongue  every 5 (five) minutes as needed for chest pain. 25 tablet 3  . pantoprazole (PROTONIX) 40 MG tablet TAKE 1 TABLET BY MOUTH EVERY DAY BEFORE BREAKFAST 90 tablet 0  . POTASSIUM PO Take 1 tablet by mouth daily.    . rosuvastatin (CRESTOR) 5 MG tablet Take 1 tablet (5 mg total) by mouth daily. (Patient taking differently: Take 5 mg by mouth daily. 1x weekly) 90 tablet 3   No current facility-administered medications on file prior to visit.    ALLERGIES: Allergies  Allergen Reactions  . Amlodipine Swelling  . Aggrenox [Aspirin-Dipyridamole Er] Tinitus  . Atorvastatin Other (See Comments)    Myalgias   . Codeine Itching and Other (See Comments)    Nose itches  . Imitrex [Sumatriptan Base] Swelling and Other (See Comments)    Throat swelling  . Isosorbide Nitrate Other (See Comments)    headaches  . Other Other (See Comments)    Artificial sweeteners cause heart palpitations (stevia is ok)  . Quinolones     Patient was warned about not using Cipro and similar antibiotics. Recent studies have raised concern that fluoroquinolone antibiotics could be associated with an increased risk of aortic aneurysm Fluoroquinolones have non-antimicrobial properties that might jeopardise the integrity of the extracellular matrix of the vascular wall In a  propensity score matched cohort study in [place], there was a 66% increased rate of aortic aneurysm or dissection associated with oral fluoroquinolone use, compared wit  . Rosuvastatin Other (See Comments)    Restless legs  . Simvastatin Other (See Comments)    Myalgias    FAMILY HISTORY: Family History  Problem Relation Age of Onset  . Coronary artery disease Father   . Aneurysm Father        AAA  . Melanoma Father   . Transient ischemic attack Father   . Heart disease Father   . AAA (abdominal aortic aneurysm) Father   . Aneurysm Mother        AAA  . Colon polyps Mother   . Heart disease Mother   . Irritable bowel syndrome Mother   .  Diverticulitis Mother   . Other Mother        Evlyn Clines  . AAA (abdominal aortic aneurysm) Mother   . Brain cancer Mother   . Breast cancer Paternal Aunt   . Liver disease Maternal Uncle   . Heart disease Brother   .  Prostate cancer Paternal Uncle   . Mesothelioma Paternal Uncle   . Colon cancer Neg Hx    SOCIAL HISTORY: Social History   Socioeconomic History  . Marital status: Married    Spouse name: Not on file  . Number of children: 1  . Years of education: Not on file  . Highest education level: Not on file  Occupational History  . Occupation: MECHANICAL DESIGNER    Employer: [employer]  Tobacco Use  . Smoking status: Current Every Day Smoker    Packs/day: 0.25    Years: 30.00    Pack years: 7.50    Types: Cigarettes  . Smokeless tobacco: Never Used  . Tobacco comment: restarted 3 weeks ago  Substance and Sexual Activity  . Alcohol use: Yes    Comment: occasional; 1 per day  . Drug use: No  . Sexual activity: Not on file  Other Topics Concern  . Not on file  Social History Narrative   She is married with 1 son   She is a Tax inspector at a factory   Former smoker   Alcohol is used at least once a week glass of wine   Denies drug use   Social Determinants of Radio broadcast assistant Strain:   . Difficulty of Paying Living Expenses: Not on file  Food Insecurity:   . Worried About Charity fundraiser in the Last Year: Not on file  . Ran Out of Food in the Last Year: Not on file  Transportation Needs:   . Lack of Transportation (Medical): Not on file  . Lack of Transportation (Non-Medical): Not on file  Physical Activity:   . Days of Exercise per Week: Not on file  . Minutes of Exercise per Session: Not on file  Stress:   . Feeling of Stress : Not on file  Social Connections:   . Frequency of Communication with Friends and Family: Not on file  . Frequency of Social Gatherings with Friends and Family: Not on file  . Attends Religious  Services: Not on file  . Active Member of Clubs or Organizations: Not on file  . Attends Archivist Meetings: Not on file  . Marital Status: Not on file  Intimate Partner Violence:   . Fear of Current or Ex-Partner: Not on file  . Emotionally Abused: Not on file  . Physically Abused: Not on file  . Sexually Abused: Not on file    REVIEW OF SYSTEMS: Constitutional: No fevers, chills, or sweats, no generalized fatigue, change in appetite Eyes: No visual changes, double vision, eye pain Ear, nose and throat: No hearing loss, ear pain, nasal congestion, sore throat Cardiovascular: No chest pain, palpitations Respiratory:  No shortness of breath at rest or with exertion, wheezes GastrointestinaI: No nausea, vomiting, diarrhea, abdominal pain, fecal incontinence Genitourinary:  No dysuria, urinary retention or frequency Musculoskeletal:  No neck pain, back pain Integumentary: No rash, pruritus, skin lesions Neurological: as above Psychiatric: No depression, insomnia, anxiety Endocrine: No palpitations, fatigue, diaphoresis, mood swings, change in appetite, change in weight, increased thirst Hematologic/Lymphatic:  No purpura, petechiae. Allergic/Immunologic: no itchy/runny eyes, nasal congestion, recent allergic reactions, rashes  PHYSICAL EXAM: Blood pressure (!) 148/95, pulse 76, resp. rate 20, height 5' 8"  (1.727 m), weight 184 lb (83.5 kg), SpO2 95 %. General: No acute distress.  Patient appears well-groomed.   Head:  Normocephalic/atraumatic Eyes:  Fundi examined but not visualized Neck: supple, no paraspinal tenderness, full range of motion Heart:  Regular rate and rhythm Lungs:  Clear to auscultation bilaterally Back: No paraspinal tenderness Neurological Exam: alert and oriented to person, place, and time. Attention span and concentration intact, recent and remote memory intact, fund of knowledge intact.  Speech fluent and not dysarthric, language intact.  CN II-XII  intact. Bulk and tone normal, muscle strength 5/5 throughout.  Sensation to light touch intact.  Deep tendon reflexes 2+ throughout, toes downgoing.  Finger to nose and heel to shin testing intact.  Gait normal, Romberg negative.  IMPRESSION: 1.  Cheiro-oral Syndrome.  TIA vs migraine 2.  Migraine without aura, without status migrainosus, not intractable 3.  Ocular migraines 4.  Cerebrovascular disease 5.  Hypertension 6.  Hyperlipidemia 7.  Tobacco use disorder 8.  Coronary artery disease 9.  Obstructive sleep apnea   PLAN: 1.  Dual antiplatelet therapy (ASA and Plavix) 2.  Crestor (LDL goal less than 70) 3.  Optimize blood pressure control. Follow up with PCP. 4.  Smoking cessation. 5.  Mediterranean diet and exercise 6.  Treatment of OSA 7.  Follow up in 6 months.  Metta Clines, DO  CC: Jenna Luo, MD

## 2019-07-07 ENCOUNTER — Other Ambulatory Visit: Payer: Self-pay

## 2019-07-07 ENCOUNTER — Encounter: Payer: Self-pay | Admitting: Neurology

## 2019-07-07 ENCOUNTER — Ambulatory Visit: Payer: BC Managed Care – PPO | Admitting: Neurology

## 2019-07-07 ENCOUNTER — Other Ambulatory Visit: Payer: Self-pay | Admitting: *Deleted

## 2019-07-07 VITALS — BP 148/95 | HR 76 | Resp 20 | Ht 68.0 in | Wt 184.0 lb

## 2019-07-07 DIAGNOSIS — G43109 Migraine with aura, not intractable, without status migrainosus: Secondary | ICD-10-CM

## 2019-07-07 DIAGNOSIS — E785 Hyperlipidemia, unspecified: Secondary | ICD-10-CM

## 2019-07-07 DIAGNOSIS — I10 Essential (primary) hypertension: Secondary | ICD-10-CM

## 2019-07-07 DIAGNOSIS — G4733 Obstructive sleep apnea (adult) (pediatric): Secondary | ICD-10-CM

## 2019-07-07 DIAGNOSIS — I2510 Atherosclerotic heart disease of native coronary artery without angina pectoris: Secondary | ICD-10-CM

## 2019-07-07 DIAGNOSIS — I7101 Dissection of thoracic aorta: Secondary | ICD-10-CM

## 2019-07-07 DIAGNOSIS — G43009 Migraine without aura, not intractable, without status migrainosus: Secondary | ICD-10-CM | POA: Diagnosis not present

## 2019-07-07 DIAGNOSIS — G459 Transient cerebral ischemic attack, unspecified: Secondary | ICD-10-CM | POA: Diagnosis not present

## 2019-07-07 DIAGNOSIS — I71019 Dissection of thoracic aorta, unspecified: Secondary | ICD-10-CM

## 2019-07-07 DIAGNOSIS — Z9989 Dependence on other enabling machines and devices: Secondary | ICD-10-CM

## 2019-07-07 DIAGNOSIS — Z720 Tobacco use: Secondary | ICD-10-CM

## 2019-07-07 NOTE — Patient Instructions (Signed)
Follow up in 6 months 

## 2019-08-20 ENCOUNTER — Ambulatory Visit
Admission: RE | Admit: 2019-08-20 | Discharge: 2019-08-20 | Disposition: A | Discharge status: Home / Self Care | Payer: BC Managed Care – PPO | Source: Ambulatory Visit | Attending: Cardiothoracic Surgery | Admitting: Cardiothoracic Surgery

## 2019-08-20 ENCOUNTER — Ambulatory Visit: Payer: BC Managed Care – PPO | Admitting: Cardiothoracic Surgery

## 2019-08-20 ENCOUNTER — Other Ambulatory Visit: Payer: Self-pay

## 2019-08-20 VITALS — BP 132/85 | HR 60 | Temp 97.9°F | Resp 20 | Ht 68.0 in | Wt 175.0 lb

## 2019-08-20 DIAGNOSIS — I71019 Dissection of thoracic aorta, unspecified: Secondary | ICD-10-CM

## 2019-08-20 DIAGNOSIS — I7101 Dissection of thoracic aorta: Secondary | ICD-10-CM

## 2019-08-20 MED ORDER — IOPAMIDOL (ISOVUE-370) INJECTION 76%
75.0000 mL | Freq: Once | INTRAVENOUS | Status: AC | PRN
  Administered 2019-08-20: 75 mL via INTRAVENOUS

## 2019-08-20 NOTE — Progress Notes (Addendum)
[address]       [place] [address]             [phone]                    Ardath T Zavalza [place] Medical Record #[id number] Date of Birth: [birth year]  Referring: Pieter Partridge, DO Primary Care: Susy Frizzle, MD  Chief Complaint:    Chief Complaint  Patient presents with   Thoracic Aortic Aneurysm    18 month f/u with CTA today    History of Present Illness:  Patient returns with a follow-up CTA of the chest .  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  Since she was originally seen she has returned to smoking.     She has a known history of vascular disease including having a myocardial infarction in 2005 treated medically. In the spring of 2017 patient noted visual changes in her eyes with a "crescent shaped" visual defect but she could not remember which. Subsequently she had episode of left hand numbness which lasted about 30 seconds and numbness of the left side of her mouth and face. She also describes visual field deficit toward the left visual field transiently in the summer. Since these episodes she has been placed on Plavix, she had been on long-term aspirin. She been intolerant of multiple statins in the past.  Because of these symptoms she was seen by neurology, MRI of the brain and neck was performed was an abnormality in the proximal left carotid origin and a CT scan/CTA of the neck was performed.  She had a repeat episode and was admitted in June 2020.  At that time she had perioral numbness and left hand numbness.  Repeat CTA of the neck was done on admission.  Thoracic surgery was not consulted at that time.   Family history is significant for abdominal aortic aneurysms in both her mother and her father which were repaired with open techniques.  There is no family history of dissection   Current  Activity/ Functional Status:  Patient is independent with mobility/ambulation, transfers, ADL's, IADL's.   Zubrod Score: At the time of surgery this patients most appropriate activity status/level should be described as: []     0    Normal activity, no symptoms [x]     1    Restricted in physical strenuous activity but ambulatory, able to do out light work []     2    Ambulatory and capable of self care, unable to do work activities, up and about               >50 % of waking hours                              []     3    Only limited self care, in bed greater than 50% of waking hours []     4    Completely disabled, no self care, confined to bed or chair []     5    Moribund   Past Medical History:  Diagnosis Date   Adenoma of right adrenal gland    Benign   Allergy    SEASONAL   Anemia    Anxiety    Arthritis  Asthma    Barrett's esophagus 02/09/2014   CAD (coronary artery disease)    a. prior MI in 2005 with 60-70% dLAD stenosis and 60-70% D2 stenosis (concern for coronary vasospasm)   Depression    Focal nodular hyperplasia of liver    GERD (gastroesophageal reflux disease)    Heart murmur    AS A CHILD   History of adenomatous polyp of colon 02/09/2014   HLD (hyperlipidemia)    HTN (hypertension)    Liver hemangioma    Migraine headache    Myocardial infarction (HCC)    NASH (nonalcoholic steatohepatitis)    nash   OSA (obstructive sleep apnea)    ahi 21.7   Palpitations    Sinus complaint    Stroke [facility])    Unstable angina ([facility]) 05/15/2017    Past Surgical History:  Procedure Laterality Date   ABDOMINOPLASTY     BLADDER SUSPENSION     CERVICAL FUSION     C4, 5, 6   COLONOSCOPY W/ BIOPSIES     ESOPHAGOGASTRODUODENOSCOPY     LEFT HEART CATH AND CORONARY ANGIOGRAPHY N/A 05/16/2017   Procedure: LEFT HEART CATH AND CORONARY ANGIOGRAPHY;  Surgeon: Troy Sine, MD;  Location: [facility];  Service: Cardiovascular;   Laterality: N/A;   TONSILLECTOMY     TUBAL LIGATION      Family History  Problem Relation Age of Onset   Coronary artery disease Father    Aneurysm Father        AAA   Melanoma Father    Transient ischemic attack Father    Heart disease Father    AAA (abdominal aortic aneurysm) Father    Aneurysm Mother        AAA   Colon polyps Mother    Heart disease Mother    Irritable bowel syndrome Mother    Diverticulitis Mother    Other Mother        Evlyn Clines   AAA (abdominal aortic aneurysm) Mother    Brain cancer Mother    Breast cancer Paternal Aunt    Liver disease Maternal Uncle    Heart disease Brother    Prostate cancer Paternal Uncle    Mesothelioma Paternal Uncle    Colon cancer Neg Hx    Both of the patient's parents had abdominal aortic aneurysm repaired in the past   Social History   Socioeconomic History   Marital status: Married    Spouse name: Not on file   Number of children: 1   Years of education: Not on file   Highest education level: Not on file  Occupational History   Occupation: MECHANICAL DESIGNER    Employer: [employer]  Tobacco Use   Smoking status: Current Every Day Smoker    Packs/day: 0.25    Years: 30.00    Pack years: 7.50    Types: Cigarettes   Smokeless tobacco: Never Used   Tobacco comment: restarted 3 weeks ago  Substance and Sexual Activity   Alcohol use: Yes    Comment: occasional; 1 per day   Drug use: No   Sexual activity: Not on file  Other Topics Concern   Not on file  Social History Narrative   She is married with 1 son   She is a Tax inspector at a factory   Former smoker   Alcohol is used at least once a week glass of wine   Denies drug use   Social Determinants of Radio broadcast assistant Strain:  Difficulty of Paying Living Expenses:   Food Insecurity:    Worried About Charity fundraiser in the Last Year:    Arboriculturist in the Last Year:     Transportation Needs:    Film/video editor (Medical):    Lack of Transportation (Non-Medical):   Physical Activity:    Days of Exercise per Week:    Minutes of Exercise per Session:   Stress:    Feeling of Stress :   Social Connections:    Frequency of Communication with Friends and Family:    Frequency of Social Gatherings with Friends and Family:    Attends Religious Services:    Active Member of Clubs or Organizations:    Attends Music therapist:    Marital Status:   Intimate Partner Violence:    Fear of Current or Ex-Partner:    Emotionally Abused:    Physically Abused:    Sexually Abused:     Social History   Tobacco Use  Smoking Status Current Every Day Smoker   Packs/day: 0.25   Years: 30.00   Pack years: 7.50   Types: Cigarettes  Smokeless Tobacco Never Used  Tobacco Comment   restarted 3 weeks ago    Social History   Substance and Sexual Activity  Alcohol Use Yes   Comment: occasional; 1 per day     Allergies  Allergen Reactions   Amlodipine Swelling   Aggrenox [Aspirin-Dipyridamole Er] Tinitus   Atorvastatin Other (See Comments)    Myalgias    Codeine Itching and Other (See Comments)    Nose itches   Imitrex [Sumatriptan Base] Swelling and Other (See Comments)    Throat swelling   Isosorbide Nitrate Other (See Comments)    headaches   Other Other (See Comments)    Artificial sweeteners cause heart palpitations (stevia is ok)   Quinolones     Patient was warned about not using Cipro and similar antibiotics. Recent studies have raised concern that fluoroquinolone antibiotics could be associated with an increased risk of aortic aneurysm Fluoroquinolones have non-antimicrobial properties that might jeopardise the integrity of the extracellular matrix of the vascular wall In a  propensity score matched cohort study in [place], there was a 66% increased rate of aortic aneurysm or dissection associated with  oral fluoroquinolone use, compared wit   Rosuvastatin Other (See Comments)    Restless legs   Simvastatin Other (See Comments)    Myalgias    Current Outpatient Medications  Medication Sig Dispense Refill   ALPRAZolam (XANAX) 0.25 MG tablet TAKE 2 TABLETS (0.5MG) BY MOUTH AT BEDTIME AS NEEDED FOR ANXIETY. 60 tablet 0   aspirin EC 81 MG tablet Take 81 mg by mouth daily.     benazepril (LOTENSIN) 40 MG tablet Take 40 mg by mouth 2 (two) times daily.     butorphanol (STADOL) 10 MG/ML nasal spray Place 1 spray into the nose daily as needed for headache. 2.5 mL 0   carvedilol (COREG) 6.25 MG tablet Take 1 tablet (6.25 mg total) by mouth 2 (two) times daily. 180 tablet 3   Cetirizine HCl (ZYRTEC PO) Take 1 tablet by mouth daily.     clopidogrel (PLAVIX) 75 MG tablet TAKE 1 TABLET BY MOUTH EVERY DAY 90 tablet 2   doxazosin (CARDURA) 2 MG tablet TAKE 1 TABLET BY MOUTH EVERY DAY (Patient taking differently: Take 2 mg by mouth 2 (two) times daily. ) 90 tablet 1   escitalopram (LEXAPRO) 10 MG tablet TAKE  1 TABLET BY MOUTH EVERY DAY 90 tablet 2   hydrochlorothiazide (HYDRODIURIL) 25 MG tablet Take 1 tablet (25 mg total) by mouth daily. Stop amlodipine 90 tablet 3   HYDROcodone-acetaminophen (NORCO/VICODIN) 5-325 MG tablet Take 1 tablet by mouth every 6 (six) hours as needed for moderate pain. 30 tablet 0   ibuprofen (ADVIL,MOTRIN) 200 MG tablet Take 400 mg by mouth 2 (two) times daily as needed for headache or moderate pain.      nitroGLYCERIN (NITROSTAT) 0.4 MG SL tablet Place 1 tablet (0.4 mg total) under the tongue every 5 (five) minutes as needed for chest pain. 25 tablet 3   pantoprazole (PROTONIX) 40 MG tablet TAKE 1 TABLET BY MOUTH EVERY DAY BEFORE BREAKFAST 90 tablet 0   POTASSIUM PO Take 1 tablet by mouth daily.     rosuvastatin (CRESTOR) 5 MG tablet Take 1 tablet (5 mg total) by mouth daily. (Patient taking differently: Take 5 mg by mouth daily. 1x weekly) 90 tablet 3    hydrALAZINE (APRESOLINE) 50 MG tablet Take 1 tablet (50 mg total) by mouth 2 (two) times daily. 180 tablet 3   No current facility-administered medications for this visit.      Review of Systems:     Cardiac Review of Systems: Y or N  Chest Pain Aqua.Slicker ]  Resting SOB [ N ] Exertional SOB  [Y ]  Orthopnea [  ]   Pedal Edema [   N]    Palpitations Aqua.Slicker ] Syncope  [N]   Presyncope Aqua.Slicker ]  General Review of Systems: [Y] = yes [  ]=no Constitional: recent weight change [n  ];  Wt loss over the last 3 months [   ] anorexia [  ]; fatigue [ y ]; nausea [  ]; night sweats [  ]; fever [  ]; or chills [  ];          Dental: poor dentition[  ]; Last Dentist visit:   Eye : blurred vision [  ]; diplopia [   ]; vision changes [  ];  Amaurosis fugax[  ]; Resp: cough [  ];  wheezing[y  ];  hemoptysis[ n ]; shortness of breath[y  ]; paroxysmal nocturnal dyspnea[  ]; dyspnea on exertion[ y ]; or orthopnea[  ];  GI:  gallstones[  ], vomiting[  ];  dysphagia[  ]; melena[  ];  hematochezia [  ]; heartburn[  ];   Hx of  Colonoscopy[  ]; GU: kidney stones [  ]; hematuria[  ];   dysuria [  ];  nocturia[  ];  history of     obstruction [  ]; urinary frequency [  ]             Skin: rash, swelling[  ];, hair loss[  ];  peripheral edema[  ];  or itching[  ]; Musculosketetal: myalgias[  ];  joint swelling[  ];  joint erythema[  ];  joint pain[  ];  back pain[  ];  Heme/Lymph: bruising[  ];  bleeding[  ];  anemia[  ];  Neuro: TIA[ Y ];  headaches[  ];  stroke[  t];  vertigo[  n];  seizures[n  ];   paresthesias[ n ];  difficulty walking[ny  ];  Psych:depression[  ]; anxiety[  ];  Endocrine: diabetes[n  ];  thyroid dysfunction[  ];  Immunizations: Flu up to date [  ]; Pneumococcal up to date [  ];  Other:  Physical Exam: BP 132/85 (BP  Location: Right Arm)    Pulse 60    Temp 97.9 F (36.6 C) (Temporal)    Resp 20    Ht 5' 8"  (1.727 m)    Wt 175 lb (79.4 kg)    SpO2 95% Comment: RA   BMI 26.61 kg/m  Blood pressure was in the  left arm.  PHYSICAL EXAMINATION: General appearance: alert, cooperative, appears stated age and no distress Head: Normocephalic, without obvious abnormality, atraumatic Neck: no adenopathy, no carotid bruit, no JVD, supple, symmetrical, trachea midline and thyroid not enlarged, symmetric, no tenderness/mass/nodules Lymph nodes: Cervical, supraclavicular, and axillary nodes normal. 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 Underlying aneurysmal disease of the ascending thoracic aorta has also been identified. EXAM: CT ANGIOGRAPHY CHEST WITH CONTRAST TECHNIQUE: Multidetector CT imaging of the chest was performed using the standard protocol during bolus administration of intravenous contrast. Multiplanar CT image reconstructions and MIPs were obtained to evaluate the vascular anatomy. CONTRAST:  27m ISOVUE-370 IOPAMIDOL (ISOVUE-370) INJECTION 76% COMPARISON:  Multiple prior studies including CTA of the neck including the aortic arch on 10/13/2018 and additional CTA studies dating back to 2017. FINDINGS: Cardiovascular: Stable thoracic aortic dimensions. The aortic root measures approximately 3.9 cm at the sinuses of Valsalva. The ascending thoracic aorta measures approximately 4 cm in greatest diameter. The proximal arch measures 3.6 cm and the distal arch 2.8 cm. The descending thoracic aorta  measures 2.5 cm. No evidence of aortic dissection. 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 Region of eccentric outpouching remains stable since the prior study and measures approximately 17 mm. This has over time increased from approximately 13 mm in 2017. Patency of the true lumen of the left common carotid artery appears stable since 2017 with slight narrowing at the level of the pseudoaneurysm/dissection. Other visualized proximal great vessels remain normally patent without evidence of dissection or aneurysm. The heart size is normal. There may be a defect or partial defect in the base of the interventricular septum just inferior to the aortic root where there appears to be some outpouching of contrast towards the right ventricle. This may not be of clinical significance given that prior echocardiograms do not appear to have demonstrated a VSD. With of the area of outpouching is approximately 8 mm. No visualized pericardial fluid. Probable mild focal calcified plaque in the distribution of the LAD. Mediastinum/Nodes: No enlarged mediastinal, hilar, or axillary lymph nodes. Thyroid gland, trachea, and esophagus demonstrate no significant findings. Lungs/Pleura: New small 4 mm nodule in the anterior left upper lobe on image 40/8. This did not appear to be present on the 2019 CTA of the chest. There is no evidence of pulmonary edema, consolidation, pneumothorax or pleural fluid. Upper Abdomen: No acute abnormality. Musculoskeletal: No chest wall abnormality. No acute or significant osseous findings. Review of the MIP  images confirms the above findings. IMPRESSION: 1. Stable aneurysmal disease of the ascending thoracic aorta measuring approximately 4 cm in greatest diameter. 2. 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 This has enlarged slightly over time from approximately 13 mm in 2017 but is stable since the prior study. 3. Possible defect or partial defect at the base of the interventricular septum just inferior to the aortic root where there appears to be some outpouching of contrast towards the right ventricle. This may not be of clinical significance given that prior echocardiograms did not appear to have demonstrated a VSD. Consider correlation with follow-up echocardiogram at some point. 4. Probable mild focal calcified plaque in the distribution of the LAD. 5. New small 4 mm nodule in the anterior left upper lobe. This did not appear to be present on the 2019 CTA of the chest. Attention on follow-up is recommended as the patient will likely require additional follow-up of the aortic abnormality. Electronically Signed   By: Aletta Edouard M.D.   On: 08/20/2019 16:43   I have independently reviewed the above radiology studies  and reviewed the findings with the patient. Ct Angio Chest Aorta W/cm &/or Wo/cm  Result Date: 01/30/2018 CLINICAL DATA:  Thoracic aortic aneurysm. Carotid dissection. Follow-up. EXAM: CT ANGIOGRAPHY CHEST WITH CONTRAST TECHNIQUE: Multidetector CT imaging of the chest was performed using the standard protocol during bolus administration of intravenous contrast. Multiplanar CT image reconstructions and MIPs were obtained to evaluate the vascular anatomy. CONTRAST:  2m ISOVUE-370 IOPAMIDOL (ISOVUE-370) INJECTION 76% COMPARISON:  01/17/2017 FINDINGS: Cardiovascular: Stable mild aneurysmal dilatation of the ascending thoracic aorta, 4 cm in the proximal ascending aorta. No change. 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 Heart is normal size. Moderate coronary artery  calcifications. Scattered aortic calcifications. Mediastinum/Nodes: No mediastinal, hilar, or axillary adenopathy. Lungs/Pleura: Mild biapical scarring and paraseptal emphysema. No confluent opacities or effusions. Upper Abdomen: Stable lesion in the right lobe of the liver measuring up to 4.3 cm. Small right adrenal nodule is stable. Musculoskeletal: No acute bony abnormality. Review of the MIP images confirms the above findings. IMPRESSION: Stable mild aneurysmal dilatation of the ascending thoracic aorta, 4 cm maximally. Recommend annual imaging followup by CTA or MRA. This recommendation follows 2010 ACCF/AHA/AATS/ACR/ASA/SCA/SCAI/SIR/STS/SVM Guidelines for the Diagnosis and Management of Patients with Thoracic Aortic Disease. Circulation. 2010; 121: [id number] focal dissection in the proximal left common carotid artery. The larger lumen is now thrombosed, presumably the false lumen with mild-to-moderate narrowing of the true lumen. Mild biapical scarring. 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 Coronary artery disease.  Aortic atherosclerosis. Electronically Signed   By: KRolm BaptiseM.D.   On: 01/30/2018 10:31    Ct Angio Chest Aorta W/cm &/or Wo/cm  Result Date: 01/17/2017 CLINICAL DATA:  Carotid artery dissection EXAM: CT ANGIOGRAPHY CHEST WITH CONTRAST TECHNIQUE: Multidetector CT imaging of the chest was performed using the standard protocol during bolus administration of intravenous contrast. Multiplanar CT image reconstructions and MIPs were obtained to evaluate the vascular anatomy. CONTRAST:  75 cc Isovue 370 COMPARISON:  07/17/2016 chest.  MR abdomen 08/14/2015 FINDINGS: Cardiovascular: Maximal diameter of the ascending aorta is 4.1 cm.  There is no evidence of aortic dissection or intramural hematoma. Mild calcified plaque in the aortic arch and descending thoracic aorta. The focal dissection at the base of the left common carotid  artery is stable. Both lumens opacified. Maximal diameter is 1.6 cm. This is stable based on my direct measurements on the prior study. There is only minimal narrowing of the true lumen. No new dissection. No evidence of extension of the dissection. Mild LAD territory coronary artery calcification. Mediastinum/Nodes: No evidence of abnormal mediastinal adenopathy. No pericardial effusion. Esophagus is unremarkable. Lungs/Pleura: Minimal dependent subsegmental atelectasis. No pneumothorax or pleural effusion. No lung mass. Upper Abdomen: Stable right lobe liver lesion. Focal enhancement in the lateral segment of the left lobe is likely a vascular phenomenon. Stable right adrenal nodule measuring 1.3 cm. Musculoskeletal: Stable L1 compression fracture. Review of the MIP images confirms the above findings. 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 There is only mild narrowing of the true lumen. Stable aneurysmal dilatation of the ascending aorta at 4.1 cm. Recommend annual imaging followup by CTA or MRA. This recommendation follows 2010 ACCF/AHA/AATS/ACR/ASA/SCA/SCAI/SIR/STS/SVM Guidelines for the Diagnosis and Management of Patients with Thoracic Aortic Disease. Circulation. 2010; 121: [id number] Chronic findings as described. Aortic Atherosclerosis (ICD10-I70.0). Electronically Signed   By: Marybelle Killings M.D.   On: 01/17/2017 12:48    Ct Angio Neck W Or Wo Contrast  Result Date: 07/19/2016 CLINICAL DATA:  54 year old female with aortic arch superiorly directed pseudoaneurysm, situated between the brachiocephalic and left CCA origins, and discovered in 2017. Subsequent encounter. EXAM: CT ANGIOGRAPHY NECK TECHNIQUE: Multidetector CT imaging of the neck was performed using the standard protocol during bolus administration of intravenous contrast. Multiplanar CT image reconstructions and MIPs were obtained to evaluate the vascular anatomy. Carotid stenosis measurements (when  applicable) are obtained utilizing NASCET criteria, using the distal internal carotid diameter as the denominator. CONTRAST:  100 mL Isovue 370 in conjunction with contrast enhanced imaging of the chest reported separately. COMPARISON:  Neck CTA 03/29/2016. Chest CTA today reported separately. FINDINGS: Skeleton: Previous C5-C6 and C6-C7 ACDF. Fusion hardware and arthrodesis appears intact. No acute osseous abnormality identified. Visualized paranasal sinuses and mastoids are stable and well pneumatized. Upper chest: Chest CTA findings are reported separately today. Other neck: Thyroid, larynx, pharynx, parapharyngeal spaces, retropharyngeal space, sublingual space, submandibular glands and parotid glands are within normal limits. No cervical lymphadenopathy. Negative visualized brain parenchyma. Aortic arch: A saccular 13 x 11 x 17 mm (AP by transverse by CC) superiorly directed outpouching from the midportion of the aortic arch adjacent to or arising from the origin of the left common carotid artery appears stable in size and configuration since November (series 301, image 96 today versus series 603, image 104 in 2017). No surrounding soft tissue inflammation. The otherwise 3 vessel arch configuration remain stable. Ectasia of the ascending aorta appears grossly stable and is detailed on the chest study today. Right carotid system: Stable brachiocephalic artery with no stenosis. There is soft and calcified plaque at the brachiocephalic artery bifurcation into the right CCA and proximal right subclavian artery which appears stable without stenosis. Negative right CCA proximal to the bifurcation. Soft and calcified plaque at the posterior right ICA origin and bulb is stable without stenosis. Mildly tortuous cervical right ICA. Negative visible right ICA siphon. Left carotid system: Less than 50 % narrowing at the left CCA origin (with respect to the distal vessel) related to mass effect from the adjacent arch lesion  is stable. On axial images today the proximal left CCA and superiorly directed saccular lesion appear more convincingly contained within the same adventitia, see series 4, image 78. Beyond its origin the left CCA is negative until the bifurcation. At the bifurcation there is calcified and soft atherosclerotic plaque affecting the lateral and posterior walls of the right ICA origin and bulb with no stenosis. Tortuous cervical right ICA distal to the bulb is stable. Negative visible right ICA siphon. Vertebral arteries: No proximal right subclavian artery stenosis despite calcified plaque. Normal right vertebral artery origin. No proximal left subclavian artery stenosis despite mild soft and calcified plaque. Normal left vertebral artery origin. The left vertebral artery is dominant, but the right has a relatively normal size. No vertebral artery stenosis to the vertebrobasilar junction, and on the left there is only mild left V4 segment calcified plaque. Both PICA origins are patent. Tortuous but otherwise normal vertebrobasilar junction. Review of the MIP images confirms the above findings IMPRESSION: 1. 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 On axial images today this lesion and the adjacent proximal left CCA appear more convincingly contained within the same adventitia (series 4, image 78) suggesting it may have begun as a dissection of the left CCA origin. 2. Other Chest CTA findings today are reported separately. 3. Stable mild bilateral ICA origin and bulb atherosclerosis without stenosis. Minimal calcified atherosclerosis of the dominant left vertebral artery V4 segment without stenosis. Mild generalized carotid and vertebral artery dolichoectasia. Electronically Signed   By: Genevie Ann M.D.   On: 07/19/2016 12:03     Recent Lab Findings: Lab Results  Component Value Date   WBC  8.6 03/30/2019   HGB 17.1 (H) 03/30/2019   HCT 53.5 (H) 03/30/2019   PLT 272 03/30/2019   GLUCOSE 110 (H) 10/13/2018   CHOL 276 (H) 10/14/2018   TRIG 309 (H) 10/14/2018   HDL 52 10/14/2018   LDLDIRECT 206 (H) 05/23/2018   LDLCALC 162 (H) 10/14/2018   ALT 92 (H) 10/13/2018   AST 66 (H) 10/13/2018   NA 139 10/13/2018   K 4.0 10/13/2018   CL 101 10/13/2018   CREATININE 0.70 10/13/2018   BUN 7 10/13/2018   CO2 25 10/13/2018   TSH 3.64 08/13/2017   INR 1.0 10/13/2018   HGBA1C 5.8 (H) 10/14/2018   Aortic Size Index=     4.1     /Body surface area is 1.95 meters squared. 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 This did not appear to be present on the 2019 CTA of the chest.    Patient was again counseled to stop smoking. We will plan on follow-up ct of the chest in 6  months-because of new question 4 mm nodule  Plan CTA chest in 18 to evaluate ascending aorta     Grace Isaac  MD      [address] [place] [address] Office [phone]   Beeper [phone]  08/20/2019 4:47 PM

## 2019-09-01 ENCOUNTER — Other Ambulatory Visit: Payer: Self-pay | Admitting: Cardiothoracic Surgery

## 2019-09-01 DIAGNOSIS — Z736 Limitation of activities due to disability: Secondary | ICD-10-CM

## 2019-09-18 ENCOUNTER — Telehealth: Payer: Self-pay | Admitting: Internal Medicine

## 2019-09-18 NOTE — Telephone Encounter (Signed)
Attempted to contact patient 09/18/19 to schedule f/u visit, no answer, left voicemail

## 2019-09-29 ENCOUNTER — Inpatient Hospital Stay: Payer: BC Managed Care – PPO

## 2019-09-29 ENCOUNTER — Inpatient Hospital Stay: Payer: BC Managed Care – PPO | Admitting: Oncology

## 2019-10-05 ENCOUNTER — Encounter: Payer: Self-pay | Admitting: Family Medicine

## 2019-10-05 MED ORDER — BENAZEPRIL HCL 40 MG PO TABS: 40.0000 mg | ORAL_TABLET | Freq: Two times a day (BID) | ORAL | 3 refills | Status: DC

## 2019-10-12 ENCOUNTER — Inpatient Hospital Stay: Payer: BC Managed Care – PPO | Admitting: Oncology

## 2019-10-12 ENCOUNTER — Inpatient Hospital Stay: Payer: BC Managed Care – PPO

## 2019-10-12 ENCOUNTER — Inpatient Hospital Stay: Payer: BC Managed Care – PPO | Attending: Oncology

## 2019-10-12 ENCOUNTER — Other Ambulatory Visit: Payer: Self-pay

## 2019-10-12 ENCOUNTER — Encounter: Payer: Self-pay | Admitting: Oncology

## 2019-10-12 VITALS — BP 112/79 | HR 64 | Temp 96.9°F | Resp 18 | Wt 174.1 lb

## 2019-10-12 DIAGNOSIS — F419 Anxiety disorder, unspecified: Secondary | ICD-10-CM | POA: Diagnosis not present

## 2019-10-12 DIAGNOSIS — D751 Secondary polycythemia: Secondary | ICD-10-CM | POA: Insufficient documentation

## 2019-10-12 DIAGNOSIS — F17210 Nicotine dependence, cigarettes, uncomplicated: Secondary | ICD-10-CM | POA: Insufficient documentation

## 2019-10-12 DIAGNOSIS — Z8673 Personal history of transient ischemic attack (TIA), and cerebral infarction without residual deficits: Secondary | ICD-10-CM | POA: Insufficient documentation

## 2019-10-12 DIAGNOSIS — G4733 Obstructive sleep apnea (adult) (pediatric): Secondary | ICD-10-CM | POA: Diagnosis not present

## 2019-10-12 DIAGNOSIS — I10 Essential (primary) hypertension: Secondary | ICD-10-CM | POA: Insufficient documentation

## 2019-10-12 DIAGNOSIS — I2510 Atherosclerotic heart disease of native coronary artery without angina pectoris: Secondary | ICD-10-CM | POA: Insufficient documentation

## 2019-10-12 LAB — CBC
HCT: 52.5 % — ABNORMAL HIGH (ref 36.0–46.0)
Hemoglobin: 17.4 g/dL — ABNORMAL HIGH (ref 12.0–15.0)
MCH: 30.5 pg (ref 26.0–34.0)
MCHC: 33.1 g/dL (ref 30.0–36.0)
MCV: 91.9 fL (ref 80.0–100.0)
Platelets: 273 10*3/uL (ref 150–400)
RBC: 5.71 MIL/uL — ABNORMAL HIGH (ref 3.87–5.11)
RDW: 14.3 % (ref 11.5–15.5)
WBC: 9.4 10*3/uL (ref 4.0–10.5)
nRBC: 0 % (ref 0.0–0.2)

## 2019-10-12 NOTE — Progress Notes (Signed)
Pt in for follow up, denies any concerns or difficulties today. 

## 2019-10-13 ENCOUNTER — Other Ambulatory Visit: Payer: Self-pay | Admitting: Family Medicine

## 2019-10-15 NOTE — Progress Notes (Signed)
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 She has been referred to [place] for evaluation of polycythemia.35 patient has had a chronically elevated hemoglobin and back in 2015 her H&H was 16.2/48.1. Most recently in January 2019 her H&H was 17.1/49.2 then 18.4/53.7 in April 2019. Her main complaint today is excessive fatigue. She reports a slight lacy rash in the medial aspect of her left knee. She denies any blood in her urine. Reports that her appetite is good and she denies any unintentional weight loss. She does not do any anabolic steroids. She reports getting a good night sleep and wakes up refreshed in the morning. She does not remember being told that she snores at night. She does not have any known chronic lung disease.  Results of blood work from 08/22/2017 were as follows: CBC showed white count of 11.5, H&H of 17.9/52.5 with an MCV of 89.6 and a platelet count of 293. E Po level was low at 1.5. Jak 2, CALR and MPL mutation testing was negative. Exon 12 testing was also negative. Urinalysis did not reveal any hematuria. CMP showed elevated AST and ALT of 63 and 107 respectively. carboxyhb levels were elevated at 9  When bone marrow biopsy on 12/13/2017 showed hypercellular bone marrow with trilineage hematopoiesis. There were no morphologic  features diagnostic of myeloproliferative neoplasm seen. Cytogenetic studies were normal  Ct chest/ abdomen did not reveal any malignancy   Interval history- appetite and weight have remained stable. Reports ongoing fatigue that has remained unchanged  ECOG PS- 1 Pain scale- 0   Review of systems- Review of Systems  Constitutional: Positive for malaise/fatigue. Negative for chills, fever and weight loss.  HENT: Negative for congestion, ear discharge and nosebleeds.   Eyes: Negative for blurred vision.  Respiratory: Negative for cough, hemoptysis, sputum production, shortness of breath and wheezing.   Cardiovascular: Negative for chest pain, palpitations, orthopnea and claudication.  Gastrointestinal: Negative for abdominal pain, blood in stool, constipation, diarrhea, heartburn, melena, nausea and vomiting.  Genitourinary: Negative for dysuria, flank pain, frequency, hematuria and urgency.  Musculoskeletal: Negative for back pain, joint pain and myalgias.  Skin: Negative for rash.  Neurological: Negative for dizziness, tingling, focal weakness, seizures, weakness and headaches.  Endo/Heme/Allergies: Does not bruise/bleed easily.  Psychiatric/Behavioral: Negative for depression and suicidal ideas. The patient does not have insomnia.       Allergies  Allergen Reactions  . Amlodipine Swelling  . Aggrenox [Aspirin-Dipyridamole Er] Tinitus  . Atorvastatin Other (See Comments)    Myalgias   . Codeine Itching and Other (See Comments)    Nose itches  . Imitrex [Sumatriptan Base] Swelling and Other (See Comments)    Throat swelling  . Isosorbide Nitrate Other (See Comments)    headaches  . Other Other (See Comments)    Artificial sweeteners cause heart palpitations (stevia is ok)  . Quinolones  Patient was warned about not using Cipro and similar antibiotics. Recent studies have raised concern that fluoroquinolone antibiotics could be associated with an increased risk of aortic  aneurysm Fluoroquinolones have non-antimicrobial properties that might jeopardise the integrity of the extracellular matrix of the vascular wall In a  propensity score matched cohort study in [place], there was a 66% increased rate of aortic aneurysm or dissection associated with oral fluoroquinolone use, compared wit  . Rosuvastatin Other (See Comments)    Restless legs  . Simvastatin Other (See Comments)    Myalgias     Past Medical History:  Diagnosis Date  . Adenoma of right adrenal gland    Benign  . Allergy    SEASONAL  . Anemia   . Anxiety   . Arthritis   . Asthma   . Barrett's esophagus 02/09/2014  . CAD (coronary artery disease)    a. prior MI in 2005 with 60-70% dLAD stenosis and 60-70% D2 stenosis (concern for coronary vasospasm)  . Depression   . Focal nodular hyperplasia of liver   . GERD (gastroesophageal reflux disease)   . Heart murmur    AS A CHILD  . History of adenomatous polyp of colon 02/09/2014  . HLD (hyperlipidemia)   . HTN (hypertension)   . Liver hemangioma   . Migraine headache   . Myocardial infarction ([place])   . NASH (nonalcoholic steatohepatitis)    nash  . OSA (obstructive sleep apnea)    ahi 21.7  . Palpitations   . Sinus complaint   . Stroke ([place])   . Unstable angina ([place]) 05/15/2017     Past Surgical History:  Procedure Laterality Date  . ABDOMINOPLASTY    . BLADDER SUSPENSION    . CERVICAL FUSION     C4, 5, 6  . COLONOSCOPY W/ BIOPSIES    . ESOPHAGOGASTRODUODENOSCOPY    . LEFT HEART CATH AND CORONARY ANGIOGRAPHY N/A 05/16/2017   Procedure: LEFT HEART CATH AND CORONARY ANGIOGRAPHY;  Surgeon: Troy Sine, MD;  Location: [facility];  Service: Cardiovascular;  Laterality: N/A;  . TONSILLECTOMY    . TUBAL LIGATION      Social History   Socioeconomic History  . Marital status: Married    Spouse name: Not on file  . Number of children: 1  . Years of education: Not on file  . Highest education level: Not on file    Occupational History  . Occupation: MECHANICAL DESIGNER    Employer: [employer]  Tobacco Use  . Smoking status: Current Every Day Smoker    Packs/day: 0.25    Years: 30.00    Pack years: 7.50    Types: Cigarettes  . Smokeless tobacco: Never Used  . Tobacco comment: restarted 3 weeks ago  Vaping Use  . Vaping Use: Never used  Substance and Sexual Activity  . Alcohol use: Yes    Comment: occasional; 1 per day  . Drug use: No  . Sexual activity: Not on file  Other Topics Concern  . Not on file  Social History Narrative   She is married with 1 son   She is a Tax inspector at a factory   Former smoker   Alcohol is used at least once a week glass of wine   Denies drug use   Social Determinants of Radio broadcast assistant Strain:   . Difficulty of Paying Living Expenses:   Food Insecurity:   . Worried About Charity fundraiser in the Last  Year:   . Ran Out of Food in the Last Year:   Transportation Needs:   . Film/video editor (Medical):   Marland Kitchen Lack of Transportation (Non-Medical):   Physical Activity:   . Days of Exercise per Week:   . Minutes of Exercise per Session:   Stress:   . Feeling of Stress :   Social Connections:   . Frequency of Communication with Friends and Family:   . Frequency of Social Gatherings with Friends and Family:   . Attends Religious Services:   . Active Member of Clubs or Organizations:   . Attends Archivist Meetings:   Marland Kitchen Marital Status:   Intimate Partner Violence:   . Fear of Current or Ex-Partner:   . Emotionally Abused:   Marland Kitchen Physically Abused:   . Sexually Abused:     Family History  Problem Relation Age of Onset  . Coronary artery disease Father   . Aneurysm Father        AAA  . Melanoma Father   . Transient ischemic attack Father   . Heart disease Father   . AAA (abdominal aortic aneurysm) Father   . Aneurysm Mother        AAA  . Colon polyps Mother   . Heart disease Mother   . Irritable bowel  syndrome Mother   . Diverticulitis Mother   . Other Mother        Evlyn Clines  . AAA (abdominal aortic aneurysm) Mother   . Brain cancer Mother   . Breast cancer Paternal Aunt   . Liver disease Maternal Uncle   . Heart disease Brother   . Prostate cancer Paternal Uncle   . Mesothelioma Paternal Uncle   . Colon cancer Neg Hx      Current Outpatient Medications:  .  ALPRAZolam (XANAX) 0.25 MG tablet, TAKE 2 TABLETS (0.5MG) BY MOUTH AT BEDTIME AS NEEDED FOR ANXIETY., Disp: 60 tablet, Rfl: 0 .  aspirin EC 81 MG tablet, Take 81 mg by mouth daily., Disp: , Rfl:  .  benazepril (LOTENSIN) 40 MG tablet, Take 1 tablet (40 mg total) by mouth 2 (two) times daily., Disp: 60 tablet, Rfl: 3 .  butorphanol (STADOL) 10 MG/ML nasal spray, Place 1 spray into the nose daily as needed for headache., Disp: 2.5 mL, Rfl: 0 .  carvedilol (COREG) 6.25 MG tablet, Take 1 tablet (6.25 mg total) by mouth 2 (two) times daily., Disp: 180 tablet, Rfl: 3 .  Cetirizine HCl (ZYRTEC PO), Take 1 tablet by mouth daily., Disp: , Rfl:  .  clopidogrel (PLAVIX) 75 MG tablet, TAKE 1 TABLET BY MOUTH EVERY DAY, Disp: 90 tablet, Rfl: 2 .  doxazosin (CARDURA) 2 MG tablet, TAKE 1 TABLET BY MOUTH EVERY DAY (Patient taking differently: Take 2 mg by mouth 2 (two) times daily. ), Disp: 90 tablet, Rfl: 1 .  hydrALAZINE (APRESOLINE) 50 MG tablet, Take 1 tablet (50 mg total) by mouth 2 (two) times daily., Disp: 180 tablet, Rfl: 3 .  hydrochlorothiazide (HYDRODIURIL) 25 MG tablet, Take 1 tablet (25 mg total) by mouth daily. Stop amlodipine, Disp: 90 tablet, Rfl: 3 .  ibuprofen (ADVIL,MOTRIN) 200 MG tablet, Take 400 mg by mouth 2 (two) times daily as needed for headache or moderate pain. , Disp: , Rfl:  .  pantoprazole (PROTONIX) 40 MG tablet, TAKE 1 TABLET BY MOUTH EVERY DAY BEFORE BREAKFAST, Disp: 90 tablet, Rfl: 0 .  POTASSIUM PO, Take 1 tablet by mouth daily., Disp: , Rfl:  .  rosuvastatin (CRESTOR) 5 MG tablet, Take 1 tablet (5 mg total)  by mouth daily. (Patient taking differently: Take 5 mg by mouth daily. 1x weekly), Disp: 90 tablet, Rfl: 3 .  escitalopram (LEXAPRO) 10 MG tablet, TAKE 1 TABLET BY MOUTH EVERY DAY, Disp: 30 tablet, Rfl: 0 .  HYDROcodone-acetaminophen (NORCO/VICODIN) 5-325 MG tablet, Take 1 tablet by mouth every 6 (six) hours as needed for moderate pain. (Patient not taking: Reported on 10/12/2019), Disp: 30 tablet, Rfl: 0 .  nitroGLYCERIN (NITROSTAT) 0.4 MG SL tablet, Place 1 tablet (0.4 mg total) under the tongue every 5 (five) minutes as needed for chest pain. (Patient not taking: Reported on 10/12/2019), Disp: 25 tablet, Rfl: 3  Physical exam:  Vitals:   10/12/19 1420  BP: 112/79  Pulse: 64  Resp: 18  Temp: (!) 96.9 F (36.1 C)  TempSrc: Tympanic  SpO2: 97%  Weight: 174 lb 1.6 oz (79 kg)   Physical Exam Constitutional:      General: She is not in acute distress. Cardiovascular:     Rate and Rhythm: Normal rate and regular rhythm.     Heart sounds: Normal heart sounds.  Pulmonary:     Effort: Pulmonary effort is normal.     Breath sounds: Normal breath sounds.  Abdominal:     General: Bowel sounds are normal.     Palpations: Abdomen is soft.  Skin:    General: Skin is warm and dry.  Neurological:     Mental Status: She is alert and oriented to person, place, and time.      CMP Latest Ref Rng & Units 10/13/2018  Glucose 70 - 99 mg/dL 110(H)  BUN 6 - 20 mg/dL 7  Creatinine 0.44 - 1.00 mg/dL 0.70  Sodium 135 - 145 mmol/L 139  Potassium 3.5 - 5.1 mmol/L 4.0  Chloride 98 - 111 mmol/L 101  CO2 22 - 32 mmol/L 25  Calcium 8.9 - 10.3 mg/dL 10.1  Total Protein 6.5 - 8.1 g/dL 7.2  Total Bilirubin 0.3 - 1.2 mg/dL 0.9  Alkaline Phos 38 - 126 U/L 74  AST 15 - 41 U/L 66(H)  ALT 0 - 44 U/L 92(H)   CBC Latest Ref Rng & Units 10/12/2019  WBC 4.0 - 10.5 K/uL 9.4  Hemoglobin 12.0 - 15.0 g/dL 17.4(H)  Hematocrit 36 - 46 % 52.5(H)  Platelets 150 - 400 K/uL 273      Assessment and plan- Patient is  a 54 y.o. 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 hct stable between 48-54. Will hold off on phlebotomy unless hct >55. She had received phlebotomy in the past with no improvement in her fatigue.   Cbc in 6 months and 1 year. I will see her in 1 year   Visit Diagnosis 1. Secondary polycythemia      Dr. Randa Evens, MD, MPH [facility] at [facility] [phone] 10/15/2019 10:50 AM

## 2019-11-11 ENCOUNTER — Other Ambulatory Visit: Payer: Self-pay | Admitting: Family Medicine

## 2019-12-08 ENCOUNTER — Ambulatory Visit: Payer: BC Managed Care – PPO | Admitting: Family Medicine

## 2019-12-08 ENCOUNTER — Other Ambulatory Visit: Payer: Self-pay

## 2019-12-08 VITALS — BP 124/90 | HR 68 | Temp 97.0°F | Ht 68.0 in | Wt 169.0 lb

## 2019-12-08 DIAGNOSIS — K7581 Nonalcoholic steatohepatitis (NASH): Secondary | ICD-10-CM

## 2019-12-08 DIAGNOSIS — D751 Secondary polycythemia: Secondary | ICD-10-CM

## 2019-12-08 DIAGNOSIS — R5382 Chronic fatigue, unspecified: Secondary | ICD-10-CM | POA: Diagnosis not present

## 2019-12-08 DIAGNOSIS — G459 Transient cerebral ischemic attack, unspecified: Secondary | ICD-10-CM

## 2019-12-08 DIAGNOSIS — I2510 Atherosclerotic heart disease of native coronary artery without angina pectoris: Secondary | ICD-10-CM

## 2019-12-08 DIAGNOSIS — D497 Neoplasm of unspecified behavior of endocrine glands and other parts of nervous system: Secondary | ICD-10-CM

## 2019-12-08 DIAGNOSIS — K7689 Other specified diseases of liver: Secondary | ICD-10-CM | POA: Diagnosis not present

## 2019-12-08 DIAGNOSIS — G9332 Myalgic encephalomyelitis/chronic fatigue syndrome: Secondary | ICD-10-CM

## 2019-12-08 DIAGNOSIS — E7800 Pure hypercholesterolemia, unspecified: Secondary | ICD-10-CM

## 2019-12-08 MED ORDER — COLESTIPOL HCL 5 G PO GRAN: 5.0000 g | GRANULES | Freq: Two times a day (BID) | ORAL | 3 refills | Status: DC

## 2019-12-08 MED ORDER — FLUTICASONE PROPIONATE 50 MCG/ACT NA SUSP: 2.0000 | Freq: Every day | NASAL | 6 refills | Status: AC

## 2019-12-08 MED ORDER — ALPRAZOLAM 0.25 MG PO TABS: ORAL_TABLET | ORAL | 0 refills | Status: AC

## 2019-12-08 MED ORDER — AZELASTINE HCL 0.1 % NA SOLN: 2.0000 | Freq: Two times a day (BID) | NASAL | 12 refills | Status: AC

## 2019-12-08 MED ORDER — CETIRIZINE HCL 10 MG PO TABS: 10.0000 mg | ORAL_TABLET | Freq: Every day | ORAL | 11 refills | Status: AC

## 2019-12-08 MED ORDER — BENAZEPRIL HCL 40 MG PO TABS: 40.0000 mg | ORAL_TABLET | Freq: Every day | ORAL | 3 refills | Status: DC

## 2019-12-08 NOTE — Progress Notes (Signed)
Subjective:    Patient ID: Janet [name], female    DOB: 11/21/1965, 54 y.o.   MRN: [id number]  HPI  08/13/17 Patient presents with greater than 4 months of pain in her right shoulder.  She has pain with abduction greater than 90 degrees.  She has pain with internal and external rotation.  She has a positive empty can sign.  She has a positive drop test.  She has pain with Hawkins maneuver.  She also has a positive O'Brien sign.  Passive range of motion is limited to approximately 100 degrees without pain.  She has decreased strength with resisted abduction. Patient also presents today with fatigue.  Patient states that she has no energy.  She easily becomes fatigued and tired with minimal activity.  She denies any chest pain.  She has no significant shortness of breath.  She denies any night sweats fevers or hemoptysis.  She does have a remote history of smoking.  However she has quit.  Her blood pressure has been relatively low at home.  She had a near presyncopal episode when her blood pressure was 100/60.  Cardiology just started her on metoprolol due to palpitations and her heart.  However she is also on amlodipine and she believes the blood pressure may be too low.  At that time, my plan was: Using sterile technique, I injected the right shoulder with 2 cc of lidocaine, 2 cc of Marcaine, and 2 cc of 40 mg/mL Kenalog.  Patient tolerated procedure well.  If pain does not improve a cortisone injection, I would recommend an MRI of the shoulder to evaluate for rotator cuff tear.  Given her chronic fatigue, I will begin the workup by obtaining baseline laboratory values including a CBC to monitor for anemia or any bone marrow abnormalities, CMP to evaluate for electrolyte dysfunctions or renal or hepatic disease.  I will check a TSH to evaluate for hypothyroidism.  I will check a vitamin B12 to evaluate for vitamin deficiency.  I will check a cortisol level to evaluate for adrenal insufficiency.  If  labs are completely normal, I will also check a sed rate to evaluate for autoimmune diseases.  If this is normal, I would begin a workup for malignancy starting with regular mammogram, colonoscopy, Pap smear, and CXR.  02/03/18 Patient was referred to Oncology for polycythemia.  I have copied Dr. Elroy Channel most recent A/P for a synopsis of the work up thus far:  Assessment and plan- Patient is a 54 y.o. 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  Jak 2 mutation testing was negative.  Patient does not have primary polycythemia.  Patient was noted to have polycythemia even before she started smoking.  She does not report any symptoms of obstructive sleep apnea but I would recommend that her primary care doctor should look into investigating obstructive sleep apnea as a possible cause of her polycythemia.  Obstructive sleep apnea and undiagnosed can also lead to chronic fatigue.  Patient reports no improvement in her symptoms despite a phlebotomy.  I will keep her goal hematocrit for phlebotomy to be greater than 50.  She will get phlebotomy today.  However I will hold off on further phlebotomies and repeat her CBC with differential in 1 month's time for a possible phlebotomy and see her at that time.  Patient reports persistent fatigue loss of  appetite as well as weight loss.  No discernible malignancy so far.  Her CT abdomen and pelvis was negative for malignancy and bone marrow biopsy also did not reveal any evidence of myeloproliferative process or lymphoma or leukemia.  She did get a repeat CT chest on 01/30/2018 2 days after her appointment which I reviewed today and again that does not show any evidence of malignancy.  Recommend follow-up with PCP to evaluate her ongoing symptoms of fatigue.  Consider ruling out endocrinopathies such  as adrenal disorders.   Patient continues to complain of severe fatigue.  She states she can barely even walk from her car into the building at times due to how weak and tired she feels.  She has no stamina.  She has very little energy.  Continues to smoke.  She denies any snoring or witnessed apneic events.  She does not believe she has sleep apnea.  This occurred suddenly earlier this spring after a camping trip.  She denies any rash consistent with Lyme disease however she states that it is possible.  She wants to be checked for vitamin B12 deficiency as well as celiac disease.  She does have severe diarrhea.  She is also reporting location.  She states that her hair falls out throughout her scalp and no specific area more than it ever has in the past.  There is no visible patches of hair loss but there is a generalized thinning of her hair.  She had a catheterization performed in January that showed noncritical coronary artery disease and ejection fraction of 65%.  She had a CT angiogram of the chest, as well as a CT of the abdomen and pelvis that revealed no specific cause that would explain her fatigue.  At that time, my plan was: I spent more than 25 minutes today with the patient discussing her work-up today, reviewing her medical records, and discussing possible causes of her fatigue.  I recommended a sleep study given the severity of her fatigue and her polycythemia.  Given the occurrence after a camping trip, I recommended Lyme titers to evaluate for possible chronic Lyme disease.  I will check a vitamin B12 although this was checked earlier this year and was found to be normal.  I will also check a cortisol level first thing in the morning in addition to an ACTH to determine if there is adrenal insufficiency.  12/08/19 Patient is currently taking Crestor once a month.  I asked if she can tolerate taking it more frequently and she is uncertain.  She has a history of severe muscle aches when she takes  statins.  Unfortunately she has a history of moderate coronary artery disease as well as several TIAs.  Therefore her goal LDL cholesterol will be less than 70 and at the present time, on her most recent lab work from June, her LDL cholesterol was 162.  She is interested in trying colestipol.  She has a friend who takes this and it has helped with diarrhea which the patient certainly endorses.  It also may perhaps lower her LDL cholesterol which I would be glad to accomplish.  She has tried Pradaxa and Repatha in the past and saw no significant improvement in her LDL cholesterol.  Therefore I am perfectly willing to try the colestipol.  She continues to endorse the chronic fatigue.  She states that she has not had sex in over a year.  She has no drive, no motivation, no energy.  She denies any chest  pain or shortness of breath or dyspnea on exertion.  She does have a history of 12 mm adrenal adenomas which appeared benign on her most recent CAT scan.  Work-up thus far has been unrevealing.  Treatment for sleep apnea has not helped.  She has not had the Covid vaccine.  I strongly recommended this but she is adamant that she does not want to receive it.  She is due for her mammogram and Pap smear however she has schedule this appointment with her gynecologist already.  She is not due for repeat colonoscopy having had a normal colonoscopy in 2015 per her report.  Past Medical History:  Diagnosis Date  . Adenoma of right adrenal gland    Benign  . Allergy    SEASONAL  . Anemia   . Anxiety   . Arthritis   . Asthma   . Barrett's esophagus 02/09/2014  . CAD (coronary artery disease)    a. prior MI in 2005 with 60-70% dLAD stenosis and 60-70% D2 stenosis (concern for coronary vasospasm)  . Depression   . Focal nodular hyperplasia of liver   . GERD (gastroesophageal reflux disease)   . Heart murmur    AS A CHILD  . History of adenomatous polyp of colon 02/09/2014  . HLD (hyperlipidemia)   . HTN  (hypertension)   . Liver hemangioma   . Migraine headache   . Myocardial infarction ([place])   . NASH (nonalcoholic steatohepatitis)    [place]  . OSA (obstructive sleep apnea)    ahi 21.7  . Palpitations   . Sinus complaint   . Stroke ([place])   . Unstable angina ([place]) 05/15/2017   Past Surgical History:  Procedure Laterality Date  . ABDOMINOPLASTY    . BLADDER SUSPENSION    . CERVICAL FUSION     C4, 5, 6  . COLONOSCOPY W/ BIOPSIES    . ESOPHAGOGASTRODUODENOSCOPY    . LEFT HEART CATH AND CORONARY ANGIOGRAPHY N/A 05/16/2017   Procedure: LEFT HEART CATH AND CORONARY ANGIOGRAPHY;  Surgeon: Troy Sine, MD;  Location: [facility];  Service: Cardiovascular;  Laterality: N/A;  . TONSILLECTOMY    . TUBAL LIGATION     Current Outpatient Medications on File Prior to Visit  Medication Sig Dispense Refill  . ALPRAZolam (XANAX) 0.25 MG tablet TAKE 2 TABLETS (0.5MG) BY MOUTH AT BEDTIME AS NEEDED FOR ANXIETY. 60 tablet 0  . aspirin EC 81 MG tablet Take 81 mg by mouth daily.    . benazepril (LOTENSIN) 40 MG tablet Take 1 tablet (40 mg total) by mouth 2 (two) times daily. 60 tablet 3  . butorphanol (STADOL) 10 MG/ML nasal spray Place 1 spray into the nose daily as needed for headache. 2.5 mL 0  . carvedilol (COREG) 6.25 MG tablet Take 1 tablet (6.25 mg total) by mouth 2 (two) times daily. 180 tablet 3  . Cetirizine HCl (ZYRTEC PO) Take 1 tablet by mouth daily.    . clopidogrel (PLAVIX) 75 MG tablet TAKE 1 TABLET BY MOUTH EVERY DAY 90 tablet 2  . doxazosin (CARDURA) 2 MG tablet TAKE 1 TABLET BY MOUTH EVERY DAY (Patient taking differently: Take 2 mg by mouth 2 (two) times daily. ) 90 tablet 1  . escitalopram (LEXAPRO) 10 MG tablet TAKE 1 TABLET BY MOUTH EVERY DAY 30 tablet 0  . hydrALAZINE (APRESOLINE) 50 MG tablet Take 1 tablet (50 mg total) by mouth 2 (two) times daily. 180 tablet 3  . hydrochlorothiazide (HYDRODIURIL) 25 MG tablet  Take 1 tablet (25 mg total) by mouth daily. Stop amlodipine 90  tablet 3  . HYDROcodone-acetaminophen (NORCO/VICODIN) 5-325 MG tablet Take 1 tablet by mouth every 6 (six) hours as needed for moderate pain. (Patient not taking: Reported on 10/12/2019) 30 tablet 0  . ibuprofen (ADVIL,MOTRIN) 200 MG tablet Take 400 mg by mouth 2 (two) times daily as needed for headache or moderate pain.     . nitroGLYCERIN (NITROSTAT) 0.4 MG SL tablet Place 1 tablet (0.4 mg total) under the tongue every 5 (five) minutes as needed for chest pain. (Patient not taking: Reported on 10/12/2019) 25 tablet 3  . pantoprazole (PROTONIX) 40 MG tablet TAKE 1 TABLET BY MOUTH EVERY DAY BEFORE BREAKFAST 90 tablet 0  . POTASSIUM PO Take 1 tablet by mouth daily.    . rosuvastatin (CRESTOR) 5 MG tablet Take 1 tablet (5 mg total) by mouth daily. (Patient taking differently: Take 5 mg by mouth daily. 1x weekly) 90 tablet 3   No current facility-administered medications on file prior to visit.   Allergies  Allergen Reactions  . Amlodipine Swelling  . Aggrenox [Aspirin-Dipyridamole Er] Tinitus  . Atorvastatin Other (See Comments)    Myalgias   . Codeine Itching and Other (See Comments)    Nose itches  . Imitrex [Sumatriptan Base] Swelling and Other (See Comments)    Throat swelling  . Isosorbide Nitrate Other (See Comments)    headaches  . Other Other (See Comments)    Artificial sweeteners cause heart palpitations (stevia is ok)  . Quinolones     Patient was warned about not using Cipro and similar antibiotics. Recent studies have raised concern that fluoroquinolone antibiotics could be associated with an increased risk of aortic aneurysm Fluoroquinolones have non-antimicrobial properties that might jeopardise the integrity of the extracellular matrix of the vascular wall In a  propensity score matched cohort study in [place], there was a 66% increased rate of aortic aneurysm or dissection associated with oral fluoroquinolone use, compared wit  . Rosuvastatin Other (See Comments)     Restless legs  . Simvastatin Other (See Comments)    Myalgias   Social History   Socioeconomic History  . Marital status: Married    Spouse name: Not on file  . Number of children: 1  . Years of education: Not on file  . Highest education level: Not on file  Occupational History  . Occupation: MECHANICAL DESIGNER    Employer: [employer]  Tobacco Use  . Smoking status: Current Every Day Smoker    Packs/day: 0.25    Years: 30.00    Pack years: 7.50    Types: Cigarettes  . Smokeless tobacco: Never Used  . Tobacco comment: restarted 3 weeks ago  Vaping Use  . Vaping Use: Never used  Substance and Sexual Activity  . Alcohol use: Yes    Comment: occasional; 1 per day  . Drug use: No  . Sexual activity: Not on file  Other Topics Concern  . Not on file  Social History Narrative   She is married with 1 son   She is a Tax inspector at a factory   Former smoker   Alcohol is used at least once a week glass of wine   Denies drug use   Social Determinants of Radio broadcast assistant Strain:   . Difficulty of Paying Living Expenses:   Food Insecurity:   . Worried About Charity fundraiser in the Last Year:   . [employer] of  Food in the Last Year:   Transportation Needs:   . Film/video editor (Medical):   Marland Kitchen Lack of Transportation (Non-Medical):   Physical Activity:   . Days of Exercise per Week:   . Minutes of Exercise per Session:   Stress:   . Feeling of Stress :   Social Connections:   . Frequency of Communication with Friends and Family:   . Frequency of Social Gatherings with Friends and Family:   . Attends Religious Services:   . Active Member of Clubs or Organizations:   . Attends Archivist Meetings:   Marland Kitchen Marital Status:   Intimate Partner Violence:   . Fear of Current or Ex-Partner:   . Emotionally Abused:   Marland Kitchen Physically Abused:   . Sexually Abused:       Review of Systems  All other systems reviewed and are negative.        Objective:   Physical Exam Vitals reviewed.  Eyes:     General: No scleral icterus. Neck:     Thyroid: No thyromegaly.     Vascular: No carotid bruit.  Cardiovascular:     Rate and Rhythm: Normal rate and regular rhythm.     Pulses: Normal pulses.     Heart sounds: Normal heart sounds. No murmur heard.  No friction rub. No gallop.   Pulmonary:     Effort: Pulmonary effort is normal. No respiratory distress.     Breath sounds: Normal breath sounds. No stridor. No wheezing, rhonchi or rales.  Abdominal:     General: Bowel sounds are normal. There is no distension.     Palpations: Abdomen is soft.     Tenderness: There is no abdominal tenderness. There is no guarding or rebound.     Hernia: No hernia is present.  Musculoskeletal:     Right lower leg: No edema.     Left lower leg: No edema.  Lymphadenopathy:     Cervical: No cervical adenopathy.  Skin:    Findings: No rash.           Assessment & Plan:  Chronic fatigue disorder  Focal nodular hyperplasia of liver  Nonalcoholic steatohepatitis (NASH) - with fibrosis  Polycythemia, secondary  Coronary artery disease involving native coronary artery of native heart without angina pectoris  Pure hypercholesterolemia  TIA (transient ischemic attack)  Neoplasm of adrenal gland  First, I strongly encouraged the Covid vaccine however the patient is adamant that she does not want to receive it.  Second I encouraged her to follow-up with her gynecologist for her mammogram and Pap smear.  She is already seeing an oncologist to treat her secondary polycythemia due to smoking.  Third I recommended that she take her Crestor as often as possible to try to lower her LDL cholesterol is close to 70 as possible.  I am happy to use colestipol 5 g p.o. twice daily to try to assist in managing her chronic diarrhea and helping her lower her LDL cholesterol.  We will also add Astelin 2 sprays each nostril twice daily for rhinorrhea.   Continue dual antiplatelet therapy as recommended by her cardiologist and her neurologist.  I am at a loss as to what to do next for chronic fatigue.  Therefore I will consult an endocrinologist to determine if they feel any further work-up would be beneficial to rule out adrenal insufficiency or any other potential endocrinopathies related to her adrenal adenomas that may contribute to chronic fatigue as I have been unable  to determine the cause.  We discussed whether depression could be playing a role.  We have tried treating the patient empirically for depression in the past but she saw no benefit with regards to her fatigue

## 2019-12-17 ENCOUNTER — Other Ambulatory Visit: Payer: Self-pay | Admitting: Family Medicine

## 2019-12-18 ENCOUNTER — Other Ambulatory Visit: Payer: Self-pay

## 2019-12-18 MED ORDER — ESCITALOPRAM OXALATE 10 MG PO TABS: 10.0000 mg | ORAL_TABLET | Freq: Every day | ORAL | 3 refills | Status: DC

## 2019-12-24 ENCOUNTER — Other Ambulatory Visit: Payer: Self-pay | Admitting: Family Medicine

## 2019-12-24 ENCOUNTER — Encounter: Payer: Self-pay | Admitting: Family Medicine

## 2019-12-24 MED ORDER — COLESTIPOL HCL 1 G PO TABS: 1.0000 g | ORAL_TABLET | Freq: Two times a day (BID) | ORAL | 3 refills | Status: AC

## 2019-12-29 ENCOUNTER — Ambulatory Visit (INDEPENDENT_AMBULATORY_CARE_PROVIDER_SITE_OTHER): Payer: BC Managed Care – PPO | Admitting: Internal Medicine

## 2019-12-29 ENCOUNTER — Other Ambulatory Visit: Payer: Self-pay

## 2019-12-29 ENCOUNTER — Encounter: Payer: Self-pay | Admitting: Internal Medicine

## 2019-12-29 VITALS — BP 128/86 | HR 57 | Ht 68.0 in | Wt 169.6 lb

## 2019-12-29 DIAGNOSIS — E7849 Other hyperlipidemia: Secondary | ICD-10-CM

## 2019-12-29 DIAGNOSIS — I712 Thoracic aortic aneurysm, without rupture, unspecified: Secondary | ICD-10-CM

## 2019-12-29 DIAGNOSIS — I2510 Atherosclerotic heart disease of native coronary artery without angina pectoris: Secondary | ICD-10-CM | POA: Diagnosis not present

## 2019-12-29 DIAGNOSIS — I10 Essential (primary) hypertension: Secondary | ICD-10-CM

## 2019-12-29 NOTE — Progress Notes (Signed)
OFFICE NOTE  Chief Complaint:  Follow-up hypertension  Primary Care Physician: Susy Frizzle, MD  HPI:  Janet [name] is a 54 y.o. female with a history of moderate coronary artery disease and MI in 2005, at which time she was noted to have a 60-70% distal LAD stenosis and a 60-70% ostial D1 stenosis on cath. There is no clear culprit lesion and she did not receive a stent. She was thought to have vasospasm. Since then she's had numerous stress tests without any recurrent ischemia and she denies any recurrent chest pain. She has had ongoing problems with TIA and has been on and off of Plavix. Mostly she notes visual field cuts as well as arm and facial numbness. She has been seen by Dr. Tomi Likens with neurology and had an echocardiogram this past year which showed an EF of 60-65%, mild LVH, mild AI and mild TR. A bubble study was not performed. She's also had bilateral carotid artery ultrasounds which showed moderate to large amount of atherosclerotic plaque on the right greater than left without hemodynamically significant stenosis. 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 She was then referred to Dr. Servando Snare with thoracic surgery. It was also noted that she had multiple subcentimeter pulmonary nodules which require follow-up. There are no plans for surgery at this time. She was placed on a monitor which demonstrated no obvious arrhythmias or atrial fibrillation. She has a history of statin intolerance in the past. She is previously been on Lipitor, Crestor, Zocor, Pravachol and is currently tolerating Livalo, however, LDL-C on 02/09/2016 was 147, which represents poor control given her high carotid artery plaque burden and history of coronary artery disease (ASCVD).   04/18/2017  Janet [name] returns today for follow-up of her dyslipidemia and coronary disease.  Fortunately she has had no further TIA events this year.   In January she underwent echo with bubble study which was negative for PFO.  She also had carotid Dopplers which were normal.  She does have ASCVD and is been statin intolerant in the past.  Given her prior MI and LAD and diagonal stenosis on catheterization, we recommended aggressive medical therapy with a goal LDL less than 70.  She was recently started on Praluent, however her LDL was not improved.  Subsequently it seems that she was taken off the medication due to elevated liver enzymes.  She does have a history of nonalcoholic fatty liver disease.  Her AST and ALT at baseline were 39 and 60, but increased to 125 and 237 on medication.  Praluent was discontinued and her AST return to 61 with ALT 116.  She was asymptomatic with this.  05/15/2017  Janet [name] was seen today as an add-on for acute chest pain.  This was not much of an issue in December when I saw her however over the past 2 weeks she has had some progressive chest discomfort.  She is required nitroglycerin.  She feels as a squeezing pain in the center of her chest which can occur with exertion or at rest.  She said the pain radiates up to her jaw and into her teeth.  She takes nitroglycerin and does have some relief.  This past week she has had to take for 5 nitroglycerin and is concerned about coronary disease.  We repeated her EKG today which demonstrates normal sinus rhythm and inferior and lateral ischemic changes.  This was present in  her EKG in December however may be somewhat worse today.  These changes have been somewhat stable and were considered repolarization abnormalities.  07/25/2017  Janet [name] was recently seen by our advanced practice providers for symptoms of chest pain, but her symptoms so included palpitations.  She wore a monitor which showed some paroxysmal atrial tachycardia.  Short runs less than 10 beats.  It is notable that her heart rate varies.  In February heart rate was in the 80s and in January in the 70s.  Today  her heart rates in the low 50s, but I still think there is room for her to potentially be on a beta-blocker.  In addition we discussed her dyslipidemia.  Unfortunately she has been intolerant to statins and had had no response to both PCSK9 inhibitors.  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  I offered genetic testing however due to cost issues he declined.  She tells me that she has a significant family history of dyslipidemia and her brother recently started on Repatha.  I suspect he may not have improvement either.  We discussed another alternative, namely Lomitapide.  This would have to be used very cautiously given her history of abnormal liver enzymes in the past, but could significantly decrease her cholesterol by an alternative mechanism.  She wishes to discuss this further.  In the meantime I would recommend we try her on ezetimibe.  She does not have a history of trying that before.  It was ordered but apparently was too expensive when it was previously non-generic.  11/19/2017  Janet [name] returns today for follow-up.  Overall she is doing well denies any recurrent palpitations or chest pain.  Heart rates in the 80s today with normal blood pressure.  We have also been seeing her in the lipid clinic for management of dyslipidemia.  She has been intolerant to statins and was a PCSK9 nonresponder.  It was thought that she might have LDL receptor mutation however genetic testing was not performed.  We discussed starting ezetimibe which she has been taking.  She reports daily fatigue but cannot directly attributed to this medicine.  We have not yet reassessed her lipid profile.  05/22/2018  Janet [name] is seen today in follow-up of her dyslipidemia.  Overall she feels better after recently her beta-blocker was increased by Almyra Deforest, PA-C for tachycardia and history of presyncope.  She denies chest pain or worsening shortness of breath.  There is no new edema.  Blood pressure is better controlled.   She still complains of fatigue without clear etiology of those symptoms.  She has not had a recent repeat lipid profile and cholesterol remains above goal.  She had tried ezetimibe but feels that it is not well tolerated.  02/12/2019  Janet [name] is seen today in follow-up.  Recently she had an unexplained syncopal episode.  She says she was with family around a campfire and was sitting down.  She stood up to get something and then apparently passed out.  She had no recollection of it other than waking up on the ground and finding some people around her.  She denies any preceding chest pain or palpitations.  She says however recently she has been having some chest pain symptoms and does have a remote history of coronary disease.  Obviously she has significant dyslipidemia.  She also has history of arrhythmias, all of which could have caused her syncopal event.  Blood pressure is noted to be elevated today.  Recently  she had been taken off of her amlodipine because she felt like it was causing her right shoulder pain.  To the point where she was going to get injections in her shoulder but after stopping the amlodipine her symptoms improved.  04/14/2019  Janet [name] returns today for follow-up.  She just underwent a stress Myoview which was negative for ischemia.  LVEF was normal.  I increased her beta-blocker further and her 30-day monitor only showed 2 small episodes of NSVT (4 beats).  She has had no further syncopal events.  Blood pressure remains uncontrolled.  She cannot tolerate an amlodipine due to shoulder pain.  She also had left heart catheterization last January 2019 which showed a 45% ostial diagonal lesion and a 20% mid LAD stenosis.  It is unlikely that this is significantly worsened.  05/06/2019  Janet [name] is seen today for 1 month follow-up of hypertension.  Blood pressure was not well controlled at her last visit.  Today is 143/95.  In general this is consistent with home readings with  systolics in the 259D and diastolics in the 63O.  Her target is to lower her blood pressure to around 120/80 ideally.  She currently takes a number of medications including carvedilol 6.25 mg twice daily, doxazosin 2 mg daily, hydralazine 25 mg twice daily, hydrochlorothiazide 25 mg daily and has been intolerant to amlodipine due to swelling.  12/29/2019  Janet [name] returns today for follow-up.  Blood pressure actually is much better controlled today 128/86.  She continues to struggle with fatigue.  She did have a follow-up CT by Dr. 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  There was probable mild focal calcified plaque in the distribution of the LAD but otherwise no significant coronary calcifications and I think is reassuring.  She does have significant dyslipidemia and unfortunately apparently did not have any significant improvement in her lipids on a PCSK9 inhibitor.  She takes rosuvastatin possibly 5 mg once a week.  She is also tried ezetimibe without any improvement.  We discussed the possibility of Nexletol though she was recently placed on colestipol I want to see if that is helped her numbers.  I suspect she has familial hyperlipidemia.  PMHx:  Past Medical History:  Diagnosis Date  . Adenoma of right adrenal gland    Benign  . Allergy    SEASONAL  . Anemia   . Anxiety   . Arthritis   . Asthma   . Barrett's esophagus 02/09/2014  . CAD (coronary artery disease)    a. prior MI in 2005 with 60-70% dLAD stenosis and 60-70% D2 stenosis (concern for coronary vasospasm)  . Depression   . Focal nodular hyperplasia of liver   . GERD (gastroesophageal reflux disease)   . Heart murmur    AS A CHILD  . History of adenomatous polyp of colon 02/09/2014  . HLD (hyperlipidemia)   . HTN (hypertension)   . Liver hemangioma   . Migraine  headache   . Myocardial infarction ([place])   . NASH (nonalcoholic steatohepatitis)    nash  . OSA (obstructive sleep apnea)    ahi 21.7  . Palpitations   . Sinus complaint   . Stroke ([place])   . Unstable angina ([place]) 05/15/2017    Past Surgical History:  Procedure Laterality Date  . ABDOMINOPLASTY    . BLADDER SUSPENSION    .  CERVICAL FUSION     C4, 5, 6  . COLONOSCOPY W/ BIOPSIES    . ESOPHAGOGASTRODUODENOSCOPY    . LEFT HEART CATH AND CORONARY ANGIOGRAPHY N/A 05/16/2017   Procedure: LEFT HEART CATH AND CORONARY ANGIOGRAPHY;  Surgeon: Troy Sine, MD;  Location: [facility];  Service: Cardiovascular;  Laterality: N/A;  . TONSILLECTOMY    . TUBAL LIGATION      FAMHx:  Family History  Problem Relation Age of Onset  . Coronary artery disease Father   . Aneurysm Father        AAA  . Melanoma Father   . Transient ischemic attack Father   . Heart disease Father   . AAA (abdominal aortic aneurysm) Father   . Aneurysm Mother        AAA  . Colon polyps Mother   . Heart disease Mother   . Irritable bowel syndrome Mother   . Diverticulitis Mother   . Other Mother        Evlyn Clines  . AAA (abdominal aortic aneurysm) Mother   . Brain cancer Mother   . Breast cancer Paternal Aunt   . Liver disease Maternal Uncle   . Heart disease Brother   . Prostate cancer Paternal Uncle   . Mesothelioma Paternal Uncle   . Colon cancer Neg Hx     SOCHx:   reports that she has been smoking cigarettes. She has a 7.50 pack-year smoking history. She has never used smokeless tobacco. She reports current alcohol use. She reports that she does not use drugs.  ALLERGIES:  Allergies  Allergen Reactions  . Amlodipine Swelling  . Aggrenox [Aspirin-Dipyridamole Er] Tinitus  . Atorvastatin Other (See Comments)    Myalgias   . Codeine Itching and Other (See Comments)    Nose itches  . Imitrex [Sumatriptan Base] Swelling and Other (See Comments)    Throat swelling  . Isosorbide Nitrate  Other (See Comments)    headaches  . Other Other (See Comments)    Artificial sweeteners cause heart palpitations (stevia is ok)  . Quinolones     Patient was warned about not using Cipro and similar antibiotics. Recent studies have raised concern that fluoroquinolone antibiotics could be associated with an increased risk of aortic aneurysm Fluoroquinolones have non-antimicrobial properties that might jeopardise the integrity of the extracellular matrix of the vascular wall In a  propensity score matched cohort study in [place], there was a 66% increased rate of aortic aneurysm or dissection associated with oral fluoroquinolone use, compared wit  . Rosuvastatin Other (See Comments)    Restless legs  . Simvastatin Other (See Comments)    Myalgias    ROS: Pertinent items noted in HPI and remainder of comprehensive ROS otherwise negative.  HOME MEDS: Current Outpatient Medications on File Prior to Visit  Medication Sig Dispense Refill  . ALPRAZolam (XANAX) 0.25 MG tablet TAKE 2 TABLETS (0.5MG) BY MOUTH AT BEDTIME AS NEEDED FOR ANXIETY. 60 tablet 0  . aspirin EC 81 MG tablet Take 81 mg by mouth daily.    Marland Kitchen azelastine (ASTELIN) 0.1 % nasal spray Place 2 sprays into both nostrils 2 (two) times daily. Use in each nostril as directed 30 mL 12  . benazepril (LOTENSIN) 40 MG tablet Take 1 tablet (40 mg total) by mouth daily. 30 tablet 3  . butorphanol (STADOL) 10 MG/ML nasal spray Place 1 spray into the nose daily as needed for headache. 2.5 mL 0  . carvedilol (COREG) 6.25 MG tablet Take 1  tablet (6.25 mg total) by mouth 2 (two) times daily. 180 tablet 3  . cetirizine (ZYRTEC) 10 MG tablet Take 1 tablet (10 mg total) by mouth daily. 30 tablet 11  . clopidogrel (PLAVIX) 75 MG tablet TAKE 1 TABLET BY MOUTH EVERY DAY 90 tablet 2  . colestipol (COLESTID) 1 g tablet Take 1 tablet (1 g total) by mouth 2 (two) times daily. 60 tablet 3  . doxazosin (CARDURA) 2 MG tablet TAKE 1 TABLET BY MOUTH EVERY DAY 90  tablet 1  . escitalopram (LEXAPRO) 10 MG tablet TAKE 1 TABLET BY MOUTH EVERY DAY 30 tablet 0  . fluticasone (FLONASE) 50 MCG/ACT nasal spray Place 2 sprays into both nostrils daily. 16 g 6  . hydrochlorothiazide (HYDRODIURIL) 25 MG tablet Take 1 tablet (25 mg total) by mouth daily. Stop amlodipine 90 tablet 3  . HYDROcodone-acetaminophen (NORCO/VICODIN) 5-325 MG tablet Take 1 tablet by mouth every 6 (six) hours as needed for moderate pain. 30 tablet 0  . ibuprofen (ADVIL,MOTRIN) 200 MG tablet Take 400 mg by mouth 2 (two) times daily as needed for headache or moderate pain.     . nitroGLYCERIN (NITROSTAT) 0.4 MG SL tablet Place 1 tablet (0.4 mg total) under the tongue every 5 (five) minutes as needed for chest pain. 25 tablet 3  . pantoprazole (PROTONIX) 40 MG tablet TAKE 1 TABLET BY MOUTH EVERY DAY BEFORE BREAKFAST 90 tablet 0  . POTASSIUM PO Take 1 tablet by mouth daily.    . rosuvastatin (CRESTOR) 5 MG tablet Take 1 tablet (5 mg total) by mouth daily. (Patient taking differently: Take 5 mg by mouth daily. 1x weekly) 90 tablet 3  . hydrALAZINE (APRESOLINE) 50 MG tablet Take 1 tablet (50 mg total) by mouth 2 (two) times daily. 180 tablet 3   No current facility-administered medications on file prior to visit.    LABS/IMAGING: No results found for this or any previous visit (from the past 48 hour(s)). No results found.  WEIGHTS: Wt Readings from Last 3 Encounters:  12/29/19 169 lb 9.6 oz (76.9 kg)  12/08/19 169 lb (76.7 kg)  10/12/19 174 lb 1.6 oz (79 kg)    VITALS: BP 128/86   Pulse (!) 57   Ht 5' 8"  (1.727 m)   Wt 169 lb 9.6 oz (76.9 kg)   SpO2 96%   BMI 25.79 kg/m   EXAM: General appearance: alert and no distress Neck: no carotid bruit, no JVD and thyroid not enlarged, symmetric, no tenderness/mass/nodules Lungs: clear to auscultation bilaterally Heart: regular rate and rhythm Abdomen: soft, non-tender; bowel sounds normal; no masses,  no organomegaly Extremities:  extremities normal, atraumatic, no cyanosis or edema Pulses: 2+ and symmetric Skin: Skin color, texture, turgor normal. No rashes or lesions Neurologic: Grossly normal Psych: Pleasant  EKG: Sinus bradycardia 57, LAFB, moderate voltage criteria for LVH with inferior and anterolateral T wave inversions-personally reviewed  ASSESSMENT: 1. Drop syncope -low risk Myoview stress test, no significant abnormalities on Holter monitoring (01/2019) 2. Hypertension 3. History of PAT 4. Moderate nonobstructive CAD with history of MI in 2005 5. Recurrent TIAs without clear etiology 6. 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 Multiple bilateral pulmonary nodules 8. 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 Familial hyperlipidemia (HeFH) - possible LDLr mutuation (non-responder to PCSK9) 11. Tobacco abuse - quit x 1 month ago 12. Statin intolerance  PLAN: 1.   Janet [name] has much better blood pressure today.  She denies any recurrent syncopal episodes.  She continues to struggle with fatigue but does have fibromyalgia and possibly chronic fatigue.  She is also on numerous medications.  She has had follow-up with Dr. Servando Snare regarding his pseudoaneurysm.  Cholesterol is still markedly elevated.  We discussed other options including possibly Nexletol but she does not want to consider that at this point.  She had mild nonobstructive coronary disease by cath in 2005 and CT angio recently although not of the coronaries specifically demonstrated only some mild focal calcified plaque in the LAD.  No other changes to her meds today.  Follow-up with me in 6 months or sooner as necessary.  Pixie Casino, MD, [facility], [place] Director of the Advanced Lipid Disorders &  Cardiovascular Risk Reduction Clinic Attending Cardiologist  Direct Dial: [phone]  Fax: [phone]  Website:  [email or web address] 12/29/2019, 11:37 AM

## 2019-12-29 NOTE — Patient Instructions (Signed)
Medication Instructions:  No Changes In Medications at this time.  *If you need a refill on your cardiac medications before your next appointment, please call your pharmacy*   Lab Work: None Ordered At This Time.  If you have labs (blood work) drawn today and your tests are completely normal, you will receive your results only by: Marland Kitchen MyChart Message (if you have MyChart) OR . A paper copy in the mail If you have any lab test that is abnormal or we need to change your treatment, we will call you to review the results.   Testing/Procedures: None Ordered At This Time.   Follow-Up: At [facility], you and your health needs are our priority.  As part of our continuing mission to provide you with exceptional heart care, we have created designated Provider Care Teams.  These Care Teams include your primary Cardiologist (physician) and Advanced Practice Providers (APPs -  Physician Assistants and Nurse Practitioners) who all work together to provide you with the care you need, when you need it.  Your next appointment:   6 month(s)  The format for your next appointment:   In Person  Provider:   K. Mali Hilty, MD

## 2019-12-31 ENCOUNTER — Other Ambulatory Visit: Payer: Self-pay | Admitting: Family Medicine

## 2020-01-01 ENCOUNTER — Encounter: Payer: Self-pay | Admitting: Family Medicine

## 2020-01-01 MED ORDER — DOXAZOSIN MESYLATE 2 MG PO TABS: 2.0000 mg | ORAL_TABLET | Freq: Every day | ORAL | 1 refills | Status: AC

## 2020-01-06 NOTE — Progress Notes (Signed)
NEUROLOGY FOLLOW UP OFFICE NOTE  Janet [name] [id number]  HISTORY OF PRESENT ILLNESS: Janet [name] is a 54 year old right-handed woman with CAD, HTN, hyperlipidiemia, OSA, asthma, arthritis, depression, NASH and liver hemangioma and migraines who follows up for TIAs.  UPDATE: Current medications:  ASA 24m; Plavix 716m Crestor 87m91meekly; colestipol, Lotensin; Coreg; HCTZ; hydralazine; Cardura; HCTZ  She is doing well.  No recurrent spells.  HISTORY: She had a TIA in March 2014, presenting as left sided numbness of her head and face. Carotid doppler performed on 08/12/12 showed no hemodynamically significant ICA stenosis. She was started on ASA afterwards.   In April 2017, she was riding in the car with her husband when suddenly her left hand became numb with weakness and wrist drop. It lasted just a few seconds but she also had left perioral numbness lasting several hours. She denied neck pain, facial weakness, or slurred speech. She followed up with her PCP, who started her on Plavix. 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 2D echo from 08/26/15 showed EF 60-65% with no cardiac source of emboli. Carotid doppler showed moderate to large amount of atherosclerotic plaque in both ICAs, but without hemodynamically significant stenosis. Hgb A1c from 08/16/15 was 5.8 and fasting lipid panel showed total cholesterol was 287, TG 185, HDL 63 and LDL 187. She was started on Livalo 2mg38mery other day. Repeat fasting lipid panel from 09/29/15 showed LDL decreased to 125.   In September 2017, she had an episode of left sided vision loss in both eyes, lasting for about 20 seconds, consistent with a TIA presenting as left homonymous hemianopia. She denies any other symptoms, such as slurred speech, focal weakness, focal numbness or  headache. LDL from 02/09/16 was 147, so she was switched from Livalo to Lipitor 80mg69mly. MRI of brain from 02/17/16 was personally reviewed was negative for acute infarct. MRA of head showed no large or medium intracranial stenosis or occlusion. 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 It also revealed ectatic ascending aorta. Repeat CTA of neck from 07/19/16 was stable. She was referred to cardiothoracic surgery, where arteriogram and possible stenting was considered. However, since she was asymptomatic and the scan from March was stable compared to November, it was decided to hold off on intervention and monitor. She also had a cardiac monitor which did not reveal any obvious arrhythmias. In the past, she was unable to tolerate Aggrenox and statins.  Shehas had ocular migraines lastingseconds. One involves only the right eye, in which she sees a crescent shape with triangles in her visual field. The other involves flashing moving lights in both eyes. There is no associated headache.  In 2018,she started to haveincreased migraines. They start in the back of the neck and are holocephalic, throbbing. Usually moderate intensity but sometimes severe. The headache is constant but has a severe headache every 2 to 3 days. She has a severe migraine headache associated with nausea, photophobia and phonophobia every 7 to 10  days. She uses Stadol. Otherwise, she uses over the counter ibuprofen daily. She reports stress, so that is a trigger. Rest and Stadol help.She was started on venlafaxine and  subsequently discontinued it after migraines resolved.  She was admitted to [facility] on 10/13/18 for numbness and paresthesia of lefthandand left side of her lowerface. No associated weakness. Lasted several hours. No associated headache. CT of head was negative for acute stroke. CTA of head and neck showed mild bilateral ICA atherosclerosis but no large vessel occlusion or stenosis. 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 MRI of brain showed chronic small vessel ischemic changes and old left cerebellar infarct but no acute stroke. 2D echo showed EF 60-65% with no source of embolus. LDL was 162. Hgb A1c was 5.8. Previous to hospitalization, she had been on ASA 2m and Plavix 784mdaily and was advised to remain on dual antiplatelet therapy. She was advised to continue Repatha which was subsequently stopped because it was ineffective. She was discharged on nicotine patch. Complicated migraine suspected. Since Repatha was ineffective, it was discontinued. Her PCP has her on Crestor once a week.  She has prior neck surgery with C3 to C5 fusions, however she denies neck pain or radicular pain down the arms. She does report numbness in the hands when she goes to sleep, in which she needs to shake it out.  PAST MEDICAL HISTORY: Past Medical History:  Diagnosis Date  . Adenoma of right adrenal gland    Benign  . Allergy    SEASONAL  . Anemia   . Anxiety   . Arthritis   . Asthma   . Barrett's esophagus 02/09/2014  . CAD (coronary artery disease)    a. prior MI in 2005 with 60-70% dLAD stenosis and 60-70% D2 stenosis (concern for coronary vasospasm)  . Depression   . Focal nodular hyperplasia of liver   . GERD (gastroesophageal reflux disease)   . Heart murmur    AS A CHILD  . History of adenomatous polyp of colon 02/09/2014  . HLD (hyperlipidemia)   . HTN (hypertension)   .  Liver hemangioma   . Migraine headache   . Myocardial infarction ([place]  . NASH (nonalcoholic steatohepatitis)    nash  . OSA (obstructive sleep apnea)    ahi 21.7  . Palpitations   . Sinus complaint   . [place]roke ([place]  . Unstable angina ([place]1/16/2019    MEDICATIONS: Current Outpatient Medications on File Prior to Visit  Medication Sig Dispense Refill  . ALPRAZolam (XANAX) 0.25 MG tablet TAKE 2 TABLETS (0.5MG) BY MOUTH AT BEDTIME AS NEEDED FOR ANXIETY. 60 tablet 0  . aspirin EC 81 MG tablet Take 81 mg by mouth daily.    . Marland Kitchenzelastine (ASTELIN) 0.1 % nasal spray Place 2 sprays into both nostrils 2 (two) times daily. Use in each nostril as directed 30 mL 12  . benazepril (LOTENSIN) 40 MG tablet TAKE 1 TABLET BY MOUTH TWICE A DAY 180 tablet 1  . butorphanol (STADOL) 10 MG/ML nasal spray Place 1 spray into the nose daily as needed for headache. 2.5 mL 0  . carvedilol (COREG) 6.25 MG tablet Take 1 tablet (6.25 mg total) by mouth 2 (two) times daily. 180 tablet 3  . cetirizine (ZYRTEC) 10 MG tablet Take 1 tablet (10 mg total) by mouth daily. 30 tablet 11  . clopidogrel (PLAVIX) 75 MG tablet  TAKE 1 TABLET BY MOUTH EVERY DAY 90 tablet 2  . colestipol (COLESTID) 1 g tablet Take 1 tablet (1 g total) by mouth 2 (two) times daily. 60 tablet 3  . doxazosin (CARDURA) 2 MG tablet Take 1 tablet (2 mg total) by mouth daily. 90 tablet 1  . escitalopram (LEXAPRO) 10 MG tablet TAKE 1 TABLET BY MOUTH EVERY DAY 30 tablet 0  . fluticasone (FLONASE) 50 MCG/ACT nasal spray Place 2 sprays into both nostrils daily. 16 g 6  . hydrALAZINE (APRESOLINE) 50 MG tablet Take 1 tablet (50 mg total) by mouth 2 (two) times daily. 180 tablet 3  . hydrochlorothiazide (HYDRODIURIL) 25 MG tablet Take 1 tablet (25 mg total) by mouth daily. Stop amlodipine 90 tablet 3  . HYDROcodone-acetaminophen (NORCO/VICODIN) 5-325 MG tablet Take 1 tablet by mouth every 6 (six) hours as needed for moderate pain. 30 tablet 0  . ibuprofen  (ADVIL,MOTRIN) 200 MG tablet Take 400 mg by mouth 2 (two) times daily as needed for headache or moderate pain.     . nitroGLYCERIN (NITROSTAT) 0.4 MG SL tablet Place 1 tablet (0.4 mg total) under the tongue every 5 (five) minutes as needed for chest pain. 25 tablet 3  . pantoprazole (PROTONIX) 40 MG tablet TAKE 1 TABLET BY MOUTH EVERY DAY BEFORE BREAKFAST 90 tablet 0  . POTASSIUM PO Take 1 tablet by mouth daily.    . rosuvastatin (CRESTOR) 5 MG tablet Take 1 tablet (5 mg total) by mouth daily. (Patient taking differently: Take 5 mg by mouth daily. 1x weekly) 90 tablet 3   No current facility-administered medications on file prior to visit.    ALLERGIES: Allergies  Allergen Reactions  . Amlodipine Swelling  . Aggrenox [Aspirin-Dipyridamole Er] Tinitus  . Atorvastatin Other (See Comments)    Myalgias   . Codeine Itching and Other (See Comments)    Nose itches  . Imitrex [Sumatriptan Base] Swelling and Other (See Comments)    Throat swelling  . Isosorbide Nitrate Other (See Comments)    headaches  . Other Other (See Comments)    Artificial sweeteners cause heart palpitations (stevia is ok)  . Quinolones     Patient was warned about not using Cipro and similar antibiotics. Recent studies have raised concern that fluoroquinolone antibiotics could be associated with an increased risk of aortic aneurysm Fluoroquinolones have non-antimicrobial properties that might jeopardise the integrity of the extracellular matrix of the vascular wall In a  propensity score matched cohort study in [place], there was a 66% increased rate of aortic aneurysm or dissection associated with oral fluoroquinolone use, compared wit  . Rosuvastatin Other (See Comments)    Restless legs  . Simvastatin Other (See Comments)    Myalgias    FAMILY HISTORY: Family History  Problem Relation Age of Onset  . Coronary artery disease Father   . Aneurysm Father        AAA  . Melanoma Father   . Transient ischemic  attack Father   . Heart disease Father   . AAA (abdominal aortic aneurysm) Father   . Aneurysm Mother        AAA  . Colon polyps Mother   . Heart disease Mother   . Irritable bowel syndrome Mother   . Diverticulitis Mother   . Other Mother        Evlyn Clines  . AAA (abdominal aortic aneurysm) Mother   . Brain cancer Mother   . Breast cancer Paternal Aunt   . Liver disease Maternal  Uncle   . Heart disease Brother   . Prostate cancer Paternal Uncle   . Mesothelioma Paternal Uncle   . Colon cancer Neg Hx     SOCIAL HISTORY: Social History   Socioeconomic History  . Marital status: Married    Spouse name: Not on file  . Number of children: 1  . Years of education: Not on file  . Highest education level: Not on file  Occupational History  . Occupation: MECHANICAL DESIGNER    Employer: [employer]  Tobacco Use  . Smoking status: Current Every Day Smoker    Packs/day: 0.25    Years: 30.00    Pack years: 7.50    Types: Cigarettes  . Smokeless tobacco: Never Used  . Tobacco comment: restarted 3 weeks ago  Vaping Use  . Vaping Use: Never used  Substance and Sexual Activity  . Alcohol use: Yes    Comment: occasional; 1 per day  . Drug use: No  . Sexual activity: Not on file  Other Topics Concern  . Not on file  Social History Narrative   She is married with 1 son   She is a Tax inspector at a factory   Former smoker   Alcohol is used at least once a week glass of wine   Denies drug use   Social Determinants of Radio broadcast assistant Strain:   . Difficulty of Paying Living Expenses: Not on file  Food Insecurity:   . Worried About Charity fundraiser in the Last Year: Not on file  . Ran Out of Food in the Last Year: Not on file  Transportation Needs:   . Lack of Transportation (Medical): Not on file  . Lack of Transportation (Non-Medical): Not on file  Physical Activity:   . Days of Exercise per Week: Not on file  . Minutes of Exercise per  Session: Not on file  Stress:   . Feeling of Stress : Not on file  Social Connections:   . Frequency of Communication with Friends and Family: Not on file  . Frequency of Social Gatherings with Friends and Family: Not on file  . Attends Religious Services: Not on file  . Active Member of Clubs or Organizations: Not on file  . Attends Archivist Meetings: Not on file  . Marital Status: Not on file  Intimate Partner Violence:   . Fear of Current or Ex-Partner: Not on file  . Emotionally Abused: Not on file  . Physically Abused: Not on file  . Sexually Abused: Not on file    PHYSICAL EXAM: Blood pressure (!) 145/102, pulse 65, weight 167 lb 6.4 oz (75.9 kg), SpO2 98 %. General: No acute distress.  Patient appears well-groomed. Head:  Normocephalic/atraumatic Eyes:  Fundi examined but not visualized Neck: supple, no paraspinal tenderness, full range of motion Heart:  Regular rate and rhythm Lungs:  Clear to auscultation bilaterally Back: No paraspinal tenderness Neurological Exam: alert and oriented to person, place, and time. Attention span and concentration intact, recent and remote memory intact, fund of knowledge intact.  Speech fluent and not dysarthric, language intact.  CN II-XII intact. Bulk and tone normal, muscle strength 5/5 throughout.  Sensation to light touch, temperature and vibration intact.  Deep tendon reflexes 2+ throughout, toes downgoing.  Finger to nose and heel to shin testing intact.  Gait normal, Romberg negative.  IMPRESSION: 1.  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  Migraine without aura, without status migrainosus, not intractable  3.  Ocular migraines 4.  Cerebrovascular disease 5.  Hypertension 6.  Hyperlipidemia 7.  Tobacco use disorder 8.  Coronary artery disease 9.  Obstructive sleep apnea  PLAN: 1.  Secondary stroke prevention as per PCP:  -  ASA and Plavix   -  Crestor and colestipol (LDL goal less than 70)  -  Glycemic control  (Hgb A1c goal less than 7)  -   Optimize blood pressure control  -  Treatment of OSA 2.  Smoking cessation 3.  Follow up one year  Metta Clines, DO  CC: Jenna Luo, MD

## 2020-01-07 ENCOUNTER — Encounter: Payer: Self-pay | Admitting: Neurology

## 2020-01-07 ENCOUNTER — Other Ambulatory Visit: Payer: Self-pay

## 2020-01-07 ENCOUNTER — Ambulatory Visit: Payer: BC Managed Care – PPO | Admitting: Neurology

## 2020-01-07 VITALS — BP 145/102 | HR 65 | Wt 167.4 lb

## 2020-01-07 DIAGNOSIS — G43109 Migraine with aura, not intractable, without status migrainosus: Secondary | ICD-10-CM

## 2020-01-07 DIAGNOSIS — I2510 Atherosclerotic heart disease of native coronary artery without angina pectoris: Secondary | ICD-10-CM

## 2020-01-07 DIAGNOSIS — I10 Essential (primary) hypertension: Secondary | ICD-10-CM | POA: Diagnosis not present

## 2020-01-07 DIAGNOSIS — E785 Hyperlipidemia, unspecified: Secondary | ICD-10-CM

## 2020-01-07 DIAGNOSIS — G43009 Migraine without aura, not intractable, without status migrainosus: Secondary | ICD-10-CM | POA: Diagnosis not present

## 2020-01-07 DIAGNOSIS — G459 Transient cerebral ischemic attack, unspecified: Secondary | ICD-10-CM | POA: Diagnosis not present

## 2020-01-07 DIAGNOSIS — Z9989 Dependence on other enabling machines and devices: Secondary | ICD-10-CM

## 2020-01-07 DIAGNOSIS — G4733 Obstructive sleep apnea (adult) (pediatric): Secondary | ICD-10-CM

## 2020-01-07 DIAGNOSIS — Z720 Tobacco use: Secondary | ICD-10-CM

## 2020-01-07 NOTE — Patient Instructions (Signed)
1.  Continue aspirin and Plavix, cholesterol medication and blood pressure medication 2.  Follow up in one year.

## 2020-01-13 ENCOUNTER — Ambulatory Visit: Payer: BC Managed Care – PPO | Admitting: Adult Health

## 2020-01-20 ENCOUNTER — Other Ambulatory Visit: Payer: Self-pay | Admitting: *Deleted

## 2020-01-20 DIAGNOSIS — I712 Thoracic aortic aneurysm, without rupture, unspecified: Secondary | ICD-10-CM

## 2020-01-20 DIAGNOSIS — R911 Solitary pulmonary nodule: Secondary | ICD-10-CM

## 2020-01-24 ENCOUNTER — Emergency Department (HOSPITAL_COMMUNITY)
Admission: EM | Admit: 2020-01-24 | Discharge: 2020-01-29 | Disposition: E | Payer: BC Managed Care – PPO | Attending: Emergency Medicine | Admitting: Emergency Medicine

## 2020-01-24 DIAGNOSIS — I469 Cardiac arrest, cause unspecified: Secondary | ICD-10-CM | POA: Insufficient documentation

## 2020-01-24 DIAGNOSIS — F17210 Nicotine dependence, cigarettes, uncomplicated: Secondary | ICD-10-CM | POA: Diagnosis not present

## 2020-01-24 DIAGNOSIS — I639 Cerebral infarction, unspecified: Secondary | ICD-10-CM | POA: Insufficient documentation

## 2020-01-24 DIAGNOSIS — Z7982 Long term (current) use of aspirin: Secondary | ICD-10-CM | POA: Insufficient documentation

## 2020-01-24 DIAGNOSIS — I119 Hypertensive heart disease without heart failure: Secondary | ICD-10-CM | POA: Insufficient documentation

## 2020-01-24 DIAGNOSIS — Z79899 Other long term (current) drug therapy: Secondary | ICD-10-CM | POA: Diagnosis not present

## 2020-01-27 ENCOUNTER — Telehealth: Payer: Self-pay | Admitting: Family Medicine

## 2020-01-27 NOTE — Telephone Encounter (Signed)
FYI: Patients husband Eddie Dibbles left vm wanting to let Dr. Dennard Schaumann know that patient passed away on 2022/07/15.  CB# [phone]

## 2020-01-29 DIAGNOSIS — 419620001 Death: Secondary | SNOMED CT | POA: Diagnosis not present

## 2020-01-29 NOTE — ED Notes (Signed)
Patient to Janet [name]

## 2020-01-29 NOTE — Code Documentation (Addendum)
Patient time of death occurred at 07/16/10

## 2020-01-29 NOTE — Code Documentation (Signed)
MD at bedside for US

## 2020-01-29 NOTE — ED Notes (Signed)
Family at bedside. 

## 2020-01-29 NOTE — Code Documentation (Signed)
Unable to find pulse

## 2020-01-29 NOTE — ED Notes (Signed)
Patient arrives from home with EMS as CPR in progress. EMS reports patient called for sudden onset left sided weakness/pain and collapse. Patient coded in ambulance on the way to the ED. PEA noted on monitor on arrival, Dr Gilford Raid at bedside.

## 2020-01-29 NOTE — ED Provider Notes (Signed)
[facility] Provider Note   CSN: [id number] Arrival date & time: 2020-02-09  1157     History Chief Complaint  Patient presents with  . Cardiac Arrest    Janet [name] is a 54 y.o. female.  Pt presents to the ED today in full cardiac arrest.  Pt was sitting on the porch this morning with her husband.  He said she clutched her chest and said she did not feel well.  She tried to get up, but had left sided weakness and was no longer able to speak.  He called EMS.  When they arrived, she had left sided paralysis.  She coded as soon as they got her in the truck.  She was in PEA.  They gave her 8 rounds of epi, some atropine, and bicarb.  They placed a king airway. Pt in PEA upon arrival to the ED.  Pt had been coded for 50 minutes PTA.         Past Medical History:  Diagnosis Date  . Adenoma of right adrenal gland    Benign  . Allergy    SEASONAL  . Anemia   . Anxiety   . Arthritis   . Asthma   . Barrett's esophagus 02/09/2014  . CAD (coronary artery disease)    a. prior MI in 2005 with 60-70% dLAD stenosis and 60-70% D2 stenosis (concern for coronary vasospasm)  . Depression   . Focal nodular hyperplasia of liver   . GERD (gastroesophageal reflux disease)   . Heart murmur    AS A CHILD  . History of adenomatous polyp of colon 02/09/2014  . HLD (hyperlipidemia)   . HTN (hypertension)   . Liver hemangioma   . Migraine headache   . Myocardial infarction ([place])   . NASH (nonalcoholic steatohepatitis)    nash  . OSA (obstructive sleep apnea)    ahi 21.7  . Palpitations   . Sinus complaint   . Stroke ([place])   . Unstable angina ([place]) 05/15/2017    Patient Active Problem List   Diagnosis Date Noted  . TIA (transient ischemic attack) 10/13/2018  . Cirrhosis of liver ([place]) 09/26/2018  . Myocardial infarction ([place]) 09/26/2018  . Neoplasm of adrenal gland 09/26/2018  . Obstructive sleep apnea treated with continuous positive airway pressure (CPAP)  07/01/2018  . OSA (obstructive sleep apnea)   . Unintended weight loss 02/18/2018  . Polycythemia, secondary 12/27/2017  . PAT (paroxysmal atrial tachycardia) ([place]) 07/25/2017  . Racing heart beat 06/04/2017  . Coronary artery disease involving native coronary artery of native heart without angina pectoris 04/18/2017  . History of adenomatous polyp of colon 02/09/2014  . Nonalcoholic steatohepatitis ([facility]) - with fibrosis 12/11/2013  . Gastroesophageal reflux disease without esophagitis 12/11/2013  . Focal nodular hyperplasia of liver 12/11/2013  . Anxiety   . Transient cerebral ischemia 08/08/2012  . Palpitations 08/02/2010  . CHEST PAIN UNSPECIFIED 05/30/2009  . Familial hyperlipidemia 07/26/2008  . TOBACCO ABUSE 07/26/2008  . CLUSTER HEADACHE SYNDROME UNSPECIFIED 07/26/2008  . Essential hypertension 07/26/2008  . Coronary atherosclerosis 07/26/2008    Past Surgical History:  Procedure Laterality Date  . ABDOMINOPLASTY    . BLADDER SUSPENSION    . CERVICAL FUSION     C4, 5, 6  . COLONOSCOPY W/ BIOPSIES    . ESOPHAGOGASTRODUODENOSCOPY    . LEFT HEART CATH AND CORONARY ANGIOGRAPHY N/A 05/16/2017   Procedure: LEFT HEART CATH AND CORONARY ANGIOGRAPHY;  Surgeon: Troy Sine, MD;  [facility]  INVASIVE CV LAB;  Service: Cardiovascular;  Laterality: N/A;  . TONSILLECTOMY    . TUBAL LIGATION       OB History   No obstetric history on file.     Family History  Problem Relation Age of Onset  . Coronary artery disease Father   . Aneurysm Father        AAA  . Melanoma Father   . Transient ischemic attack Father   . Heart disease Father   . AAA (abdominal aortic aneurysm) Father   . Aneurysm Mother        AAA  . Colon polyps Mother   . Heart disease Mother   . Irritable bowel syndrome Mother   . Diverticulitis Mother   . Other Mother        Evlyn Clines  . AAA (abdominal aortic aneurysm) Mother   . Brain cancer Mother   . Breast cancer Paternal Aunt   . Liver  disease Maternal Uncle   . Heart disease Brother   . Prostate cancer Paternal Uncle   . Mesothelioma Paternal Uncle   . Colon cancer Neg Hx     Social History   Tobacco Use  . Smoking status: Current Every Day Smoker    Packs/day: 0.25    Years: 30.00    Pack years: 7.50    Types: Cigarettes  . Smokeless tobacco: Never Used  . Tobacco comment: restarted 3 weeks ago  Vaping Use  . Vaping Use: Never used  Substance Use Topics  . Alcohol use: Yes    Comment: occasional; 1 per day  . Drug use: No    Home Medications Prior to Admission medications   Medication Sig Start Date End Date Taking? Authorizing Provider  ALPRAZolam (XANAX) 0.25 MG tablet TAKE 2 TABLETS (0.5MG) BY MOUTH AT BEDTIME AS NEEDED FOR ANXIETY. 12/08/19   Susy Frizzle, MD  aspirin EC 81 MG tablet Take 81 mg by mouth daily.    [provider]  azelastine (ASTELIN) 0.1 % nasal spray Place 2 sprays into both nostrils 2 (two) times daily. Use in each nostril as directed 12/08/19   Susy Frizzle, MD  benazepril (LOTENSIN) 40 MG tablet TAKE 1 TABLET BY MOUTH TWICE A DAY 12/31/19   Susy Frizzle, MD  butorphanol (STADOL) 10 MG/ML nasal spray Place 1 spray into the nose daily as needed for headache. 06/30/19   Susy Frizzle, MD  carvedilol (COREG) 6.25 MG tablet Take 1 tablet (6.25 mg total) by mouth 2 (two) times daily. 02/12/19   Hilty, Nadean Corwin, MD  cetirizine (ZYRTEC) 10 MG tablet Take 1 tablet (10 mg total) by mouth daily. 12/08/19   Susy Frizzle, MD  clopidogrel (PLAVIX) 75 MG tablet TAKE 1 TABLET BY MOUTH EVERY DAY 06/29/19   Susy Frizzle, MD  colestipol (COLESTID) 1 g tablet Take 1 tablet (1 g total) by mouth 2 (two) times daily. 12/24/19   Susy Frizzle, MD  doxazosin (CARDURA) 2 MG tablet Take 1 tablet (2 mg total) by mouth daily. 01/01/20   Susy Frizzle, MD  escitalopram (LEXAPRO) 10 MG tablet TAKE 1 TABLET BY MOUTH EVERY DAY 12/18/19   Susy Frizzle, MD  fluticasone [facility])  50 MCG/ACT nasal spray Place 2 sprays into both nostrils daily. 12/08/19   Susy Frizzle, MD  hydrALAZINE (APRESOLINE) 50 MG tablet Take 1 tablet (50 mg total) by mouth 2 (two) times daily. 05/06/19 10/12/19  Pixie Casino, MD  hydrochlorothiazide (HYDRODIURIL) 25  MG tablet Take 1 tablet (25 mg total) by mouth daily. Stop amlodipine 09/09/18   Susy Frizzle, MD  HYDROcodone-acetaminophen (NORCO/VICODIN) 5-325 MG tablet Take 1 tablet by mouth every 6 (six) hours as needed for moderate pain. 01/23/16   Susy Frizzle, MD  ibuprofen (ADVIL,MOTRIN) 200 MG tablet Take 400 mg by mouth 2 (two) times daily as needed for headache or moderate pain.     [provider]  nitroGLYCERIN (NITROSTAT) 0.4 MG SL tablet Place 1 tablet (0.4 mg total) under the tongue every 5 (five) minutes as needed for chest pain. 06/04/17   Ledora Bottcher, PA  pantoprazole (PROTONIX) 40 MG tablet TAKE 1 TABLET BY MOUTH EVERY DAY BEFORE BREAKFAST 05/25/19   Gatha Mayer, MD  POTASSIUM PO Take 1 tablet by mouth daily.    [provider]  rosuvastatin (CRESTOR) 5 MG tablet Take 1 tablet (5 mg total) by mouth daily. Patient taking differently: Take 5 mg by mouth daily. 1x weekly 09/09/18   Susy Frizzle, MD    Allergies    Amlodipine, Aggrenox [aspirin-dipyridamole er], Atorvastatin, Codeine, Imitrex [sumatriptan base], Isosorbide nitrate, Other, Quinolones, Rosuvastatin, and Simvastatin  Review of Systems   Review of Systems  Unable to perform ROS: Patient unresponsive    Physical Exam Updated Vital Signs Ht 5' 8"  (1.727 m)   Wt 75.8 kg   BMI 25.39 kg/m   Physical Exam Vitals and nursing note reviewed. Exam conducted with a chaperone present.  Constitutional:      Appearance: She is toxic-appearing.  HENT:     Head: Normocephalic and atraumatic.     Right Ear: External ear normal.     Left Ear: External ear normal.     Nose: Nose normal.     Mouth/Throat:     Mouth: Mucous membranes  are dry.  Eyes:     Conjunctiva/sclera: Conjunctivae normal.  Cardiovascular:     Comments: PEA Pulmonary:     Comments: No spontaneous air movement.  Good BS bilaterally with bagging. Abdominal:     General: Abdomen is flat.  Musculoskeletal:        General: No deformity.     Cervical back: No rigidity.  Skin:    General: Skin is warm.  Neurological:     Mental Status: She is unresponsive.     GCS: GCS eye subscore is 1. GCS verbal subscore is 1. GCS motor subscore is 1.     ED Results / Procedures / Treatments   Labs (all labs ordered are listed, but only abnormal results are displayed) Labs Reviewed - No data to display  EKG None  Radiology No results found.  Procedures Procedures (including critical care time)  Medications Ordered in ED Medications - No data to display  ED Course  I have reviewed the triage vital signs and the nursing notes.  Pertinent labs & imaging results that were available during my care of the patient were reviewed by me and considered in my medical decision making (see chart for details).    MDM Rules/Calculators/A&P                          Pt had been down for 50 minutes upon arrival.  She was in PEA.  Bedside US showed a few unorganized heart beats likely from epi.  Due to the prolonged down time, the code was called.  TOD 1212.   Pt's husband notified.  Pt d/w Dr. Dennard Schaumann (  pcp for pt) at [facility] who will sign the death certificate.   Final Clinical Impression(s) / ED Diagnoses Final diagnoses:  Cardiopulmonary arrest [facility])  Cerebrovascular accident (CVA), unspecified mechanism ([place])    Rx / [place] Orders ED Discharge Orders    None       Isla Pence, MD Feb 09, 2020 1338

## 2020-01-29 DEATH — deceased

## 2020-02-08 ENCOUNTER — Ambulatory Visit: Payer: BC Managed Care – PPO | Admitting: Endocrinology

## 2020-02-18 ENCOUNTER — Ambulatory Visit: Payer: BC Managed Care – PPO | Admitting: Cardiothoracic Surgery

## 2020-02-18 ENCOUNTER — Other Ambulatory Visit: Payer: BC Managed Care – PPO

## 2020-04-12 ENCOUNTER — Other Ambulatory Visit: Payer: BC Managed Care – PPO

## 2020-10-13 ENCOUNTER — Ambulatory Visit: Payer: BC Managed Care – PPO | Admitting: Oncology

## 2021-01-10 ENCOUNTER — Ambulatory Visit: Payer: BC Managed Care – PPO | Admitting: Neurology
